# Patient Record
Sex: Female | Born: 1950
Health system: Southern US, Community
[De-identification: ages and names within clinical notes are randomized; demographics above are authoritative.]

## PROBLEM LIST (undated history)

## (undated) DIAGNOSIS — M1990 Unspecified osteoarthritis, unspecified site: Secondary | ICD-10-CM

## (undated) DIAGNOSIS — E785 Hyperlipidemia, unspecified: Secondary | ICD-10-CM

## (undated) DIAGNOSIS — E559 Vitamin D deficiency, unspecified: Secondary | ICD-10-CM

## (undated) DIAGNOSIS — I10 Essential (primary) hypertension: Secondary | ICD-10-CM

## (undated) DIAGNOSIS — D649 Anemia, unspecified: Secondary | ICD-10-CM

## (undated) HISTORY — PX: TONSILLECTOMY: SHX5217

## (undated) HISTORY — PX: ABDOMINAL SURGERY: SHX537

## (undated) HISTORY — PX: COLON SURGERY: SHX602

---
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
## 2018-10-01 ENCOUNTER — Other Ambulatory Visit: Payer: Self-pay

## 2018-10-01 ENCOUNTER — Ambulatory Visit (INDEPENDENT_AMBULATORY_CARE_PROVIDER_SITE_OTHER): Payer: Medicare Other | Admitting: Internal Medicine

## 2018-10-01 ENCOUNTER — Encounter (INDEPENDENT_AMBULATORY_CARE_PROVIDER_SITE_OTHER): Payer: Self-pay | Admitting: Internal Medicine

## 2018-10-01 VITALS — BP 140/70 | HR 72 | Ht 66.0 in | Wt 130.6 lb

## 2018-10-01 DIAGNOSIS — E559 Vitamin D deficiency, unspecified: Secondary | ICD-10-CM

## 2018-10-01 DIAGNOSIS — M19041 Primary osteoarthritis, right hand: Secondary | ICD-10-CM

## 2018-10-01 DIAGNOSIS — Z13820 Encounter for screening for osteoporosis: Secondary | ICD-10-CM

## 2018-10-01 DIAGNOSIS — E782 Mixed hyperlipidemia: Secondary | ICD-10-CM | POA: Diagnosis not present

## 2018-10-01 DIAGNOSIS — I10 Essential (primary) hypertension: Secondary | ICD-10-CM | POA: Diagnosis not present

## 2018-10-01 DIAGNOSIS — M1990 Unspecified osteoarthritis, unspecified site: Secondary | ICD-10-CM

## 2018-10-01 DIAGNOSIS — E785 Hyperlipidemia, unspecified: Secondary | ICD-10-CM | POA: Insufficient documentation

## 2018-10-01 DIAGNOSIS — E2839 Other primary ovarian failure: Secondary | ICD-10-CM

## 2018-10-01 DIAGNOSIS — M19042 Primary osteoarthritis, left hand: Secondary | ICD-10-CM

## 2018-10-01 HISTORY — DX: Unspecified osteoarthritis, unspecified site: M19.90

## 2018-10-01 HISTORY — DX: Hyperlipidemia, unspecified: E78.5

## 2018-10-01 HISTORY — DX: Vitamin D deficiency, unspecified: E55.9

## 2018-10-01 HISTORY — DX: Essential (primary) hypertension: I10

## 2018-10-01 MED ORDER — ESTRADIOL 0.5 MG PO TABS: 0.5000 mg | ORAL_TABLET | Freq: Every day | ORAL | 3 refills | Status: DC

## 2018-10-01 MED ORDER — PROGESTERONE MICRONIZED 100 MG PO CAPS: 100.0000 mg | ORAL_CAPSULE | Freq: Every day | ORAL | 3 refills | Status: DC

## 2018-10-01 NOTE — Progress Notes (Signed)
Wellness Office Visit  Subjective:  Patient ID: Anne Aguirre, female    DOB: 06-28-1950  Age: 68 y.o. MRN: [id number]  CC: This lady comes in for follow-up of hypertension, hyperlipidemia, vitamin D deficiency and  and osteoarthritis. HPI From the last visit, I discontinued her statin therapy since she does not have any history of coronary artery disease or cerebrovascular disease and recommended that she take vitamin D3 10,000 units a day, which she is doing. She continues on all her antihypertensive medications. She denies any chest pain, dyspnea or palpitations.  Past Medical History:  Diagnosis Date  . Essential hypertension, benign 10/01/2018  . HLD (hyperlipidemia) 10/01/2018  . Osteoarthritis 10/01/2018  . Vitamin D deficiency disease 10/01/2018      Family History  Problem Relation Age of Onset  . Hypertension Mother   . Stroke Mother   . Stroke Father   . Hypertension Brother   . 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     Current Meds  Medication Sig  . amLODipine (NORVASC) 10 MG tablet Take 10 mg by mouth daily.  . Cholecalciferol (VITAMIN D-3) 125 MCG (5000 UT) TABS Take 10,000 Units by mouth daily.  . irbesartan (AVAPRO) 300 MG tablet Take 300 mg by mouth daily.  . Nebivolol HCl (BYSTOLIC) 20 MG TABS Take 1 tablet by mouth daily.     Nutrition  Consistent and healthy. Sleep  She consistently wakes up at 3 in the morning and then takes a couple of hours before she can go to sleep again.  Exercise  Walks 2 miles , four days a week.Yoga every morning. Bio Identical Hormones  None.  Objective:   Today's Vitals: BP 140/70   Pulse 72   Ht 5\' 6"  (1.676 m)   Wt 130 lb 9.6 oz (59.2 kg)   BMI 21.08 kg/m  Vitals with BMI 10/01/2018  Height 5\' 6"   Weight 130 lbs 10 oz  BMI 97.02  Systolic 637  Diastolic 70  Pulse 72     Physical Exam   She looks systemically  well.  Her systolic blood pressure slightly elevated.  She is alert and orientated without any focal neurological signs.    Assessment   1. Osteoporosis screening   2. Essential hypertension, benign   3. Mixed hyperlipidemia   4. Vitamin D deficiency disease   5. Primary osteoarthritis of both hands   6. Menopause ovarian failure      Plan: 1. She will continue with all medications for chronic conditions above. 2. Blood work is ordered as below. 3. I will send her for a bone density scan to look for osteoporosis as she has not had one for more than a decade. 4. Today, we discussed bioidentical hormone therapy and discussed the women's health initiative study, as well as other studies showing benefits when bioidentical hormones are used.  I discussed the pros and cons, side effects and benefits regarding estradiol and progesterone.  She would like to proceed and I have sent a prescription for both at lower doses. 5. Further recommendations will depend on all these tests and I will see her in a couple of months time for follow-up to see how she is doing. 6. I spent 40 minutes with this patient face-to-face, more than 50% of the time was involved in discussing bioidentical hormone therapy.  Tests ordered Orders Placed This Encounter  Procedures  . DG Bone  Density  . COMPLETE METABOLIC PANEL WITH GFR  . VITAMIN D 25 Hydroxy (Vit-D Deficiency, Fractures)  . Lipid panel     Wilson SingerNimish C Cleofas Hudgins, MD

## 2018-10-02 ENCOUNTER — Encounter (INDEPENDENT_AMBULATORY_CARE_PROVIDER_SITE_OTHER): Payer: Self-pay | Admitting: Internal Medicine

## 2018-10-02 LAB — LIPID PANEL
Cholesterol: 236 mg/dL — ABNORMAL HIGH (ref <200)
HDL: 73 mg/dL (ref >=50)
LDL Cholesterol (Calc): 132 mg/dL (calc) — ABNORMAL HIGH
Non-HDL Cholesterol (Calc): 163 mg/dL (calc) — ABNORMAL HIGH (ref <130)
Total CHOL/HDL Ratio: 3.2 (calc) (ref <5.0)
Triglycerides: 173 mg/dL — ABNORMAL HIGH (ref <150)

## 2018-10-02 LAB — COMPLETE METABOLIC PANEL WITH GFR
AG Ratio: 1.6 (calc) (ref 1.0–2.5)
ALT: 15 U/L (ref 6–29)
AST: 30 U/L (ref 10–35)
Albumin: 4.4 g/dL (ref 3.6–5.1)
Alkaline phosphatase (APISO): 69 U/L (ref 37–153)
BUN: 14 mg/dL (ref 7–25)
CO2: 23 mmol/L (ref 20–32)
Calcium: 10.8 mg/dL — ABNORMAL HIGH (ref 8.6–10.4)
Chloride: 94 mmol/L — ABNORMAL LOW (ref 98–110)
Creat: 0.94 mg/dL (ref 0.50–0.99)
GFR, Est African American: 73 mL/min/{1.73_m2} (ref >=60)
GFR, Est Non African American: 63 mL/min/{1.73_m2} (ref >=60)
Globulin: 2.8 g/dL (calc) (ref 1.9–3.7)
Glucose, Bld: 94 mg/dL (ref 65–99)
Potassium: 4.7 mmol/L (ref 3.5–5.3)
Sodium: 130 mmol/L — ABNORMAL LOW (ref 135–146)
Total Bilirubin: 0.8 mg/dL (ref 0.2–1.2)
Total Protein: 7.2 g/dL (ref 6.1–8.1)

## 2018-10-02 LAB — VITAMIN D 25 HYDROXY (VIT D DEFICIENCY, FRACTURES): Vit D, 25-Hydroxy: 80 ng/mL (ref 30–100)

## 2018-10-06 ENCOUNTER — Other Ambulatory Visit: Payer: Self-pay | Admitting: *Deleted

## 2018-10-06 DIAGNOSIS — Z20822 Contact with and (suspected) exposure to covid-19: Secondary | ICD-10-CM

## 2018-10-08 LAB — NOVEL CORONAVIRUS, NAA: SARS-CoV-2, NAA: NOT DETECTED

## 2018-10-24 ENCOUNTER — Other Ambulatory Visit (INDEPENDENT_AMBULATORY_CARE_PROVIDER_SITE_OTHER): Payer: Self-pay | Admitting: Internal Medicine

## 2018-11-02 ENCOUNTER — Other Ambulatory Visit: Payer: Self-pay

## 2018-11-02 ENCOUNTER — Ambulatory Visit (INDEPENDENT_AMBULATORY_CARE_PROVIDER_SITE_OTHER): Payer: Medicare Other

## 2018-11-02 DIAGNOSIS — Z23 Encounter for immunization: Secondary | ICD-10-CM

## 2018-11-17 ENCOUNTER — Other Ambulatory Visit (INDEPENDENT_AMBULATORY_CARE_PROVIDER_SITE_OTHER): Payer: Self-pay | Admitting: Internal Medicine

## 2018-11-18 ENCOUNTER — Other Ambulatory Visit (INDEPENDENT_AMBULATORY_CARE_PROVIDER_SITE_OTHER): Payer: Self-pay | Admitting: Internal Medicine

## 2018-11-19 ENCOUNTER — Telehealth (INDEPENDENT_AMBULATORY_CARE_PROVIDER_SITE_OTHER): Payer: Self-pay

## 2018-11-19 ENCOUNTER — Other Ambulatory Visit (INDEPENDENT_AMBULATORY_CARE_PROVIDER_SITE_OTHER): Payer: Self-pay | Admitting: Internal Medicine

## 2018-11-19 MED ORDER — ESTRADIOL 0.5 MG PO TABS: 0.5000 mg | ORAL_TABLET | Freq: Every day | ORAL | 0 refills | Status: DC

## 2018-11-19 MED ORDER — IRBESARTAN 300 MG PO TABS: 300.0000 mg | ORAL_TABLET | Freq: Every day | ORAL | 0 refills | Status: DC

## 2018-11-19 MED ORDER — AMLODIPINE BESYLATE 10 MG PO TABS: 10.0000 mg | ORAL_TABLET | Freq: Every day | ORAL | 0 refills | Status: DC

## 2018-11-19 MED ORDER — BYSTOLIC 20 MG PO TABS: 1.0000 | ORAL_TABLET | Freq: Every day | ORAL | 0 refills | Status: DC

## 2018-11-19 MED ORDER — PROGESTERONE MICRONIZED 100 MG PO CAPS: 100.0000 mg | ORAL_CAPSULE | Freq: Every day | ORAL | 0 refills | Status: DC

## 2018-11-19 NOTE — Telephone Encounter (Signed)
Please let the patient know that I have sent a 90-day supply of all her medications to [facility].

## 2018-11-25 ENCOUNTER — Ambulatory Visit (INDEPENDENT_AMBULATORY_CARE_PROVIDER_SITE_OTHER): Payer: Medicare Other | Admitting: Internal Medicine

## 2019-02-10 ENCOUNTER — Telehealth (INDEPENDENT_AMBULATORY_CARE_PROVIDER_SITE_OTHER): Payer: Self-pay

## 2019-02-10 NOTE — Telephone Encounter (Signed)
Pt is calling she has Blood pressure issues and wants to know if she is safe to get the vaccination

## 2019-02-10 NOTE — Telephone Encounter (Signed)
If she is referring to COVID-19 vaccination, yes, she is safe to get this vaccine.

## 2019-02-10 NOTE — Telephone Encounter (Signed)
Called left message of instructions. Thank you

## 2019-02-26 ENCOUNTER — Other Ambulatory Visit (INDEPENDENT_AMBULATORY_CARE_PROVIDER_SITE_OTHER): Payer: Self-pay | Admitting: Internal Medicine

## 2019-05-27 ENCOUNTER — Other Ambulatory Visit (INDEPENDENT_AMBULATORY_CARE_PROVIDER_SITE_OTHER): Payer: Self-pay | Admitting: Internal Medicine

## 2019-05-27 ENCOUNTER — Telehealth (INDEPENDENT_AMBULATORY_CARE_PROVIDER_SITE_OTHER): Payer: Self-pay | Admitting: Nurse Practitioner

## 2019-05-27 NOTE — Telephone Encounter (Signed)
I called her and left her a voicemail to call the office and schedule a office visit before her next refill

## 2019-05-27 NOTE — Telephone Encounter (Signed)
Benedetto Goad, when you have time either this week or next week will you call this patient and see if can get her scheduled for follow-up.  I refilled a 30-day supply of her blood pressure medication, but I would not be able to refill any additional prescriptions until she is been seen in the office by either myself or Dr. Karilyn Cota.  Thank you.

## 2019-06-16 ENCOUNTER — Encounter (INDEPENDENT_AMBULATORY_CARE_PROVIDER_SITE_OTHER): Payer: Self-pay | Admitting: Internal Medicine

## 2019-06-16 ENCOUNTER — Other Ambulatory Visit: Payer: Self-pay

## 2019-06-16 ENCOUNTER — Ambulatory Visit (INDEPENDENT_AMBULATORY_CARE_PROVIDER_SITE_OTHER): Payer: Medicare PPO | Admitting: Internal Medicine

## 2019-06-16 VITALS — BP 140/80 | HR 80 | Temp 98.5°F | Ht 66.0 in | Wt 132.4 lb

## 2019-06-16 DIAGNOSIS — F064 Anxiety disorder due to known physiological condition: Secondary | ICD-10-CM

## 2019-06-16 DIAGNOSIS — Z13820 Encounter for screening for osteoporosis: Secondary | ICD-10-CM | POA: Diagnosis not present

## 2019-06-16 DIAGNOSIS — E559 Vitamin D deficiency, unspecified: Secondary | ICD-10-CM | POA: Diagnosis not present

## 2019-06-16 DIAGNOSIS — I10 Essential (primary) hypertension: Secondary | ICD-10-CM

## 2019-06-16 DIAGNOSIS — E782 Mixed hyperlipidemia: Secondary | ICD-10-CM | POA: Diagnosis not present

## 2019-06-16 LAB — COMPLETE METABOLIC PANEL WITH GFR
AG Ratio: 1.6 (calc) (ref 1.0–2.5)
ALT: 15 U/L (ref 6–29)
AST: 27 U/L (ref 10–35)
Albumin: 4.3 g/dL (ref 3.6–5.1)
Alkaline phosphatase (APISO): 68 U/L (ref 37–153)
BUN: 13 mg/dL (ref 7–25)
CO2: 22 mmol/L (ref 20–32)
Calcium: 10 mg/dL (ref 8.6–10.4)
Chloride: 99 mmol/L (ref 98–110)
Creat: 0.94 mg/dL (ref 0.50–0.99)
GFR, Est African American: 73 mL/min/{1.73_m2} (ref >=60)
GFR, Est Non African American: 63 mL/min/{1.73_m2} (ref >=60)
Globulin: 2.7 g/dL (calc) (ref 1.9–3.7)
Glucose, Bld: 110 mg/dL — ABNORMAL HIGH (ref 65–99)
Potassium: 4.5 mmol/L (ref 3.5–5.3)
Sodium: 133 mmol/L — ABNORMAL LOW (ref 135–146)
Total Bilirubin: 0.7 mg/dL (ref 0.2–1.2)
Total Protein: 7 g/dL (ref 6.1–8.1)

## 2019-06-16 LAB — LIPID PANEL
Cholesterol: 221 mg/dL — ABNORMAL HIGH (ref <200)
HDL: 77 mg/dL (ref >=50)
LDL Cholesterol (Calc): 120 mg/dL (calc) — ABNORMAL HIGH
Non-HDL Cholesterol (Calc): 144 mg/dL (calc) — ABNORMAL HIGH (ref <130)
Total CHOL/HDL Ratio: 2.9 (calc) (ref <5.0)
Triglycerides: 127 mg/dL (ref <150)

## 2019-06-16 MED ORDER — LORAZEPAM 0.5 MG PO TABS: 0.5000 mg | ORAL_TABLET | Freq: Two times a day (BID) | ORAL | 0 refills | Status: DC | PRN

## 2019-06-16 MED ORDER — BYSTOLIC 20 MG PO TABS: 1.0000 | ORAL_TABLET | Freq: Every day | ORAL | 0 refills | Status: DC
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
## 2019-06-22 NOTE — Progress Notes (Signed)
Patient called. She was on vacation; she does not have PC access. Pt was  given lab results.

## 2019-07-01 ENCOUNTER — Other Ambulatory Visit (INDEPENDENT_AMBULATORY_CARE_PROVIDER_SITE_OTHER): Payer: Self-pay | Admitting: Nurse Practitioner

## 2019-07-01 DIAGNOSIS — I10 Essential (primary) hypertension: Secondary | ICD-10-CM

## 2019-07-01 MED ORDER — AMLODIPINE BESYLATE 10 MG PO TABS: 10.0000 mg | ORAL_TABLET | Freq: Every day | ORAL | 0 refills | Status: DC

## 2019-08-30 ENCOUNTER — Other Ambulatory Visit (INDEPENDENT_AMBULATORY_CARE_PROVIDER_SITE_OTHER): Payer: Self-pay | Admitting: Internal Medicine

## 2019-09-15 ENCOUNTER — Other Ambulatory Visit (INDEPENDENT_AMBULATORY_CARE_PROVIDER_SITE_OTHER): Payer: Self-pay | Admitting: Internal Medicine

## 2019-09-21 ENCOUNTER — Encounter (INDEPENDENT_AMBULATORY_CARE_PROVIDER_SITE_OTHER): Payer: Self-pay | Admitting: Internal Medicine

## 2019-09-21 ENCOUNTER — Ambulatory Visit (INDEPENDENT_AMBULATORY_CARE_PROVIDER_SITE_OTHER): Payer: Medicare PPO | Admitting: Internal Medicine

## 2019-09-21 ENCOUNTER — Other Ambulatory Visit: Payer: Self-pay

## 2019-09-21 VITALS — BP 145/85 | HR 62 | Temp 97.9°F | Ht 66.0 in | Wt 134.0 lb

## 2019-09-21 DIAGNOSIS — E782 Mixed hyperlipidemia: Secondary | ICD-10-CM | POA: Diagnosis not present

## 2019-09-21 DIAGNOSIS — I10 Essential (primary) hypertension: Secondary | ICD-10-CM

## 2019-09-21 DIAGNOSIS — Z13820 Encounter for screening for osteoporosis: Secondary | ICD-10-CM | POA: Diagnosis not present

## 2019-09-21 MED ORDER — AMLODIPINE BESYLATE 10 MG PO TABS: 10.0000 mg | ORAL_TABLET | Freq: Every day | ORAL | 1 refills | Status: DC

## 2019-09-21 NOTE — Progress Notes (Signed)
Metrics: Intervention Frequency ACO  Documented Smoking Status Yearly  Screened one or more times in 24 months  Cessation Counseling or  Active cessation medication Past 24 months  Past 24 months   Guideline developer: UpToDate (See UpToDate for funding source) Date Released: 2014       Wellness Office Visit  Subjective:  Patient ID: Anne Aguirre, female    DOB: 07-Sep-1950  Age: 69 y.o. MRN: [id number]  CC: This lady comes in for follow-up of hypertension, hyperlipidemia. HPI  I did order a bone density scan but for some reason it was not scheduled.  We will chase this up now. She continues on amlodipine, Bystolic and Avapro for her hypertension.  She denies any chest pain, dyspnea, palpitations or limb weakness. Her hyperlipidemia has not required statin therapy. Past Medical History:  Diagnosis Date  . Essential hypertension, benign 10/01/2018  . HLD (hyperlipidemia) 10/01/2018  . Osteoarthritis 10/01/2018  . Vitamin D deficiency disease 10/01/2018   Past Surgical History:  Procedure Laterality Date  . TONSILLECTOMY Bilateral      Family History  Problem Relation Age of Onset  . Hypertension Mother   . Stroke Mother   . Stroke Father   . Hypertension Brother   . 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   Social History   Tobacco Use  . Smoking status: Never Smoker  . Smokeless tobacco: Never Used  Substance Use Topics  . Alcohol use: Yes    Alcohol/week: 7.0 standard drinks    Types: 7 Glasses of wine per week    Comment: Red wine    Current Meds  Medication Sig  . amLODipine (NORVASC) 10 MG tablet Take 1 tablet (10 mg total) by mouth daily.  Marland Kitchen BYSTOLIC 20 MG TABS TAKE ONE TABLET (20MG  TOTAL) BY MOUTH DAILY  . Cholecalciferol (VITAMIN D-3) 125 MCG (5000 UT) TABS Take 10,000 Units by mouth daily.  . irbesartan (AVAPRO) 300 MG tablet TAKE ONE TABLET (300MG  TOTAL) BY MOUTH DAILY    . [DISCONTINUED] amLODipine (NORVASC) 10 MG tablet Take 1 tablet (10 mg total) by mouth daily.      No flowsheet data found.   Objective:   Today's Vitals: BP (!) 145/85 (BP Location: Left Arm, Patient Position: Sitting, Cuff Size: Normal)   Pulse 62   Temp 97.9 F (36.6 C) (Temporal)   Ht 5\' 6"  (1.676 m)   Wt 134 lb (60.8 kg)   SpO2 94%   BMI 21.63 kg/m  Vitals with BMI 09/21/2019 06/16/2019 10/01/2018  Height 5\' 6"  5\' 6"  5\' 6"   Weight 134 lbs 132 lbs 6 oz 130 lbs 10 oz  BMI 21.64 21.38 21.09  Systolic 145 140 11/21/2019  Diastolic 85 80 70  Pulse 62 80 72     Physical Exam  She looks systemically well.  Weight is stable.  Blood pressure is slightly elevated today but not unreasonable for her age.     Assessment   1. Essential hypertension, benign   2. Osteoporosis screening   3. Mixed hyperlipidemia       Tests ordered No orders of the defined types were placed in this encounter.    Plan: 1. She will continue with all antihypertensive medications listed above.  I have refilled her amlodipine today. 2. She will continue to focus on nutrition as far as her hyperlipidemia is concerned. 3. We will chase up on the bone density scan.  4. I will see in the next 3 to 4 months for an annual physical exam when we will do all the blood work.   Meds ordered this encounter  Medications  . amLODipine (NORVASC) 10 MG tablet    Sig: Take 1 tablet (10 mg total) by mouth daily.    Dispense:  90 tablet    Refill:  1    Amrie Gurganus Normajean Glasgow, MD

## 2019-09-28 ENCOUNTER — Other Ambulatory Visit (HOSPITAL_COMMUNITY): Payer: Medicare PPO

## 2019-11-10 ENCOUNTER — Ambulatory Visit (INDEPENDENT_AMBULATORY_CARE_PROVIDER_SITE_OTHER): Payer: Medicare PPO

## 2019-11-29 ENCOUNTER — Other Ambulatory Visit (INDEPENDENT_AMBULATORY_CARE_PROVIDER_SITE_OTHER): Payer: Self-pay | Admitting: Nurse Practitioner

## 2019-12-14 ENCOUNTER — Other Ambulatory Visit (INDEPENDENT_AMBULATORY_CARE_PROVIDER_SITE_OTHER): Payer: Self-pay | Admitting: Nurse Practitioner

## 2020-01-24 ENCOUNTER — Encounter (INDEPENDENT_AMBULATORY_CARE_PROVIDER_SITE_OTHER): Payer: Medicare PPO | Admitting: Internal Medicine

## 2020-02-28 ENCOUNTER — Telehealth (INDEPENDENT_AMBULATORY_CARE_PROVIDER_SITE_OTHER): Payer: Self-pay | Admitting: Internal Medicine

## 2020-02-28 ENCOUNTER — Other Ambulatory Visit (INDEPENDENT_AMBULATORY_CARE_PROVIDER_SITE_OTHER): Payer: Self-pay | Admitting: Internal Medicine

## 2020-02-28 MED ORDER — IRBESARTAN 300 MG PO TABS
300.0000 mg | ORAL_TABLET | Freq: Every day | ORAL | 0 refills | Status: DC
Start: 2020-02-28 — End: 2020-03-30

## 2020-02-28 NOTE — Telephone Encounter (Signed)
Done

## 2020-03-15 ENCOUNTER — Other Ambulatory Visit (INDEPENDENT_AMBULATORY_CARE_PROVIDER_SITE_OTHER): Payer: Self-pay | Admitting: Internal Medicine

## 2020-03-20 ENCOUNTER — Other Ambulatory Visit: Payer: Self-pay

## 2020-03-20 ENCOUNTER — Encounter (INDEPENDENT_AMBULATORY_CARE_PROVIDER_SITE_OTHER): Payer: Self-pay | Admitting: Internal Medicine

## 2020-03-20 ENCOUNTER — Ambulatory Visit (INDEPENDENT_AMBULATORY_CARE_PROVIDER_SITE_OTHER): Payer: Medicare PPO | Admitting: Internal Medicine

## 2020-03-20 VITALS — BP 122/80 | HR 88 | Temp 97.9°F | Ht 66.0 in | Wt 132.2 lb

## 2020-03-20 DIAGNOSIS — Z23 Encounter for immunization: Secondary | ICD-10-CM

## 2020-03-20 DIAGNOSIS — Z1159 Encounter for screening for other viral diseases: Secondary | ICD-10-CM | POA: Diagnosis not present

## 2020-03-20 DIAGNOSIS — E559 Vitamin D deficiency, unspecified: Secondary | ICD-10-CM | POA: Diagnosis not present

## 2020-03-20 DIAGNOSIS — Z0001 Encounter for general adult medical examination with abnormal findings: Secondary | ICD-10-CM

## 2020-03-20 DIAGNOSIS — E782 Mixed hyperlipidemia: Secondary | ICD-10-CM | POA: Diagnosis not present

## 2020-03-20 DIAGNOSIS — I10 Essential (primary) hypertension: Secondary | ICD-10-CM

## 2020-03-20 NOTE — Progress Notes (Signed)
Chief Complaint: This pleasant 70 year old lady comes in for an annual physical exam and to address her chronic conditions listed below. HPI: She has a history of hypertension for which she is takes several medications listed below.  She has no history of coronary artery disease or cerebrovascular disease. She has a history of hyperlipidemia but has not required statin therapy. She also has vitamin D deficiency and takes vitamin D3 supplementation. She denies any chest pain, dyspnea, palpitations or limb weakness.  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    Social History   Tobacco Use   Smoking status: Never Smoker   Smokeless tobacco: Never Used  Substance Use Topics   Alcohol use: Yes    Alcohol/week: 7.0 standard drinks    Types: 7 Glasses of wine per week    Comment: Red wine      Allergies: No Known Allergies   Current Meds  Medication Sig   amLODipine (NORVASC) 10 MG tablet Take 1 tablet (10 mg total) by mouth daily.   Cholecalciferol (VITAMIN D-3) 125 MCG (5000 UT) TABS Take 10,000 Units by mouth daily.   irbesartan (AVAPRO) 300 MG tablet Take 1 tablet (300 mg total) by mouth daily.   Nebivolol HCl 20 MG TABS TAKE ONE TABLET (20MG  TOTAL) BY MOUTH DAILY     Flowsheet Row Office Visit from 03/20/2020 in [facility]  PHQ-9 Total Score 0      [place] from the symptoms mentioned above,there are no other symptoms referable to all systems reviewed.       Physical Exam: Blood pressure 122/80, pulse 88, temperature 97.9 F (36.6 C), temperature source Temporal, height 5\' 6"  (1.676 m), weight 132 lb 3.2 oz  (60 kg), SpO2 95 %. Vitals with BMI 03/20/2020 09/21/2019 06/16/2019  Height 5\' 6"  5\' 6"  5\' 6"   Weight 132 lbs 3 oz 134 lbs 132 lbs 6 oz  BMI 21.35 21.64 21.38  Systolic 122 145 11/21/2019  Diastolic 80 85 80  Pulse 88 62 80      She looks systemically well.  Healthy weight. General: Alert, cooperative, and appears to be the stated age.No pallor.  No jaundice.  No clubbing. Head: Normocephalic Eyes: Sclera white, pupils equal and reactive to light, red reflex x 2,  Ears: Normal bilaterally Oral cavity: Lips, mucosa, and tongue normal: Teeth and gums normal Neck: No adenopathy, supple, symmetrical, trachea midline, and thyroid does not appear enlarged. Breast: No masses felt. Respiratory: Clear to auscultation bilaterally.No wheezing, crackles or bronchial breathing. Cardiovascular: Heart sounds are present and appear to be normal without murmurs or added sounds.  No carotid bruits.  Peripheral pulses are present and equal bilaterally.: Gastrointestinal:positive bowel sounds, no hepatosplenomegaly.  No masses felt.No tenderness. Skin: Clear, No rashes noted.No worrisome skin lesions seen. Neurological: Grossly intact without focal findings, cranial nerves II through XII intact, muscle strength equal bilaterally Musculoskeletal: No acute joint abnormalities noted.Full range of movement noted with joints. Psychiatric: Affect appropriate, non-anxious.    Assessment  1. Encounter for general adult medical examination with abnormal findings   2. Encounter for hepatitis C screening test for low risk patient   3. Essential hypertension, benign  4. Mixed hyperlipidemia   5. Vitamin D deficiency disease     Tests Ordered:   Orders Placed This Encounter  Procedures   Hepatitis C antibody   CBC   COMPLETE METABOLIC PANEL WITH GFR   Lipid panel   T3, free   T4, free   TSH   VITAMIN D 25 Hydroxy (Vit-D Deficiency, Fractures)     Plan  1. Relatively healthy 70 year old lady with  hypertension which is controlled. 2. Blood work is ordered. 3. She will continue with all antihypertensive medications listed above. 4. Further recommendations will depend on blood results and she will follow up with Sarah in 4 months.  Today, she was given Prevnar 13 vaccination. 5. In addition to a preventative visit, I performed an office visit to address her hypertension today.     No orders of the defined types were placed in this encounter.    Michaeljoseph Revolorio C Drucella Karbowski   03/20/2020, 3:05 PM

## 2020-03-20 NOTE — Addendum Note (Signed)
Addended by: Ronita Hipps on: 03/20/2020 03:22 PM   Modules accepted: Orders

## 2020-03-21 LAB — COMPLETE METABOLIC PANEL WITH GFR
AG Ratio: 1.5 (calc) (ref 1.0–2.5)
ALT: 16 U/L (ref 6–29)
AST: 27 U/L (ref 10–35)
Albumin: 4.1 g/dL (ref 3.6–5.1)
Alkaline phosphatase (APISO): 73 U/L (ref 37–153)
BUN/Creatinine Ratio: 14 (calc) (ref 6–22)
BUN: 15 mg/dL (ref 7–25)
CO2: 25 mmol/L (ref 20–32)
Calcium: 11.4 mg/dL — ABNORMAL HIGH (ref 8.6–10.4)
Chloride: 97 mmol/L — ABNORMAL LOW (ref 98–110)
Creat: 1.04 mg/dL — ABNORMAL HIGH (ref 0.50–0.99)
GFR, Est African American: 64 mL/min/{1.73_m2} (ref >=60)
GFR, Est Non African American: 55 mL/min/{1.73_m2} — ABNORMAL LOW (ref >=60)
Globulin: 2.8 g/dL (calc) (ref 1.9–3.7)
Glucose, Bld: 169 mg/dL — ABNORMAL HIGH (ref 65–139)
Potassium: 3.9 mmol/L (ref 3.5–5.3)
Sodium: 133 mmol/L — ABNORMAL LOW (ref 135–146)
Total Bilirubin: 0.7 mg/dL (ref 0.2–1.2)
Total Protein: 6.9 g/dL (ref 6.1–8.1)

## 2020-03-21 LAB — LIPID PANEL
Cholesterol: 213 mg/dL — ABNORMAL HIGH (ref <200)
HDL: 61 mg/dL (ref >=50)
LDL Cholesterol (Calc): 103 mg/dL (calc) — ABNORMAL HIGH
Non-HDL Cholesterol (Calc): 152 mg/dL (calc) — ABNORMAL HIGH (ref <130)
Total CHOL/HDL Ratio: 3.5 (calc) (ref <5.0)
Triglycerides: 365 mg/dL — ABNORMAL HIGH (ref <150)

## 2020-03-21 LAB — CBC
HCT: 46.8 % — ABNORMAL HIGH (ref 35.0–45.0)
Hemoglobin: 16.6 g/dL — ABNORMAL HIGH (ref 11.7–15.5)
MCH: 34.2 pg — ABNORMAL HIGH (ref 27.0–33.0)
MCHC: 35.5 g/dL (ref 32.0–36.0)
MCV: 96.5 fL (ref 80.0–100.0)
MPV: 9.4 fL (ref 7.5–12.5)
Platelets: 312 10*3/uL (ref 140–400)
RBC: 4.85 10*6/uL (ref 3.80–5.10)
RDW: 11.3 % (ref 11.0–15.0)
WBC: 9.7 10*3/uL (ref 3.8–10.8)

## 2020-03-21 LAB — VITAMIN D 25 HYDROXY (VIT D DEFICIENCY, FRACTURES): Vit D, 25-Hydroxy: 63 ng/mL (ref 30–100)

## 2020-03-21 LAB — HEPATITIS C ANTIBODY
Hepatitis C Ab: NONREACTIVE
SIGNAL TO CUT-OFF: 0.01 (ref <1.00)

## 2020-03-21 LAB — T4, FREE: Free T4: 1.1 ng/dL (ref 0.8–1.8)

## 2020-03-21 LAB — TSH: TSH: 2.72 mIU/L (ref 0.40–4.50)

## 2020-03-21 LAB — T3, FREE: T3, Free: 3.4 pg/mL (ref 2.3–4.2)

## 2020-03-28 NOTE — Progress Notes (Signed)
Please call this patient to make sure she read her MyChart message regarding her elevated blood glucose as she needs to come in for fasting glucose 1 day.  Thanks.

## 2020-03-29 ENCOUNTER — Other Ambulatory Visit (INDEPENDENT_AMBULATORY_CARE_PROVIDER_SITE_OTHER): Payer: Self-pay | Admitting: Internal Medicine

## 2020-03-29 DIAGNOSIS — R739 Hyperglycemia, unspecified: Secondary | ICD-10-CM

## 2020-03-30 ENCOUNTER — Telehealth (INDEPENDENT_AMBULATORY_CARE_PROVIDER_SITE_OTHER): Payer: Self-pay

## 2020-03-30 ENCOUNTER — Other Ambulatory Visit (INDEPENDENT_AMBULATORY_CARE_PROVIDER_SITE_OTHER): Payer: Self-pay | Admitting: Internal Medicine

## 2020-03-30 ENCOUNTER — Other Ambulatory Visit (INDEPENDENT_AMBULATORY_CARE_PROVIDER_SITE_OTHER): Payer: Medicare PPO

## 2020-03-30 DIAGNOSIS — I10 Essential (primary) hypertension: Secondary | ICD-10-CM

## 2020-03-30 MED ORDER — AMLODIPINE BESYLATE 10 MG PO TABS: 10.0000 mg | ORAL_TABLET | Freq: Every day | ORAL | 1 refills | Status: DC

## 2020-03-30 MED ORDER — IRBESARTAN 300 MG PO TABS: 300.0000 mg | ORAL_TABLET | Freq: Every day | ORAL | 1 refills | Status: DC

## 2020-03-30 NOTE — Telephone Encounter (Signed)
Patient called and asked for a refill of the following medications to be sent to [facility] for 90 days only.  amLODipine (NORVASC) 10 MG tablet  Last filled 09/21/2019, # 90 with 1 refill  irbesartan (AVAPRO) 300 MG tablet  Last filled 02/28/2020, # 90 with 0 refills, they only gave her 15 due to limited amount on hand and she needs a new Rx sent  She will be switching to [facility] in [place] after this.

## 2020-04-04 ENCOUNTER — Other Ambulatory Visit: Payer: Self-pay

## 2020-04-04 ENCOUNTER — Other Ambulatory Visit (INDEPENDENT_AMBULATORY_CARE_PROVIDER_SITE_OTHER): Payer: Medicare PPO

## 2020-04-04 DIAGNOSIS — R739 Hyperglycemia, unspecified: Secondary | ICD-10-CM | POA: Diagnosis not present

## 2020-04-05 LAB — COMPLETE METABOLIC PANEL WITH GFR
AG Ratio: 1.3 (calc) (ref 1.0–2.5)
ALT: 20 U/L (ref 6–29)
AST: 33 U/L (ref 10–35)
Albumin: 4.3 g/dL (ref 3.6–5.1)
Alkaline phosphatase (APISO): 68 U/L (ref 37–153)
BUN/Creatinine Ratio: 13 (calc) (ref 6–22)
BUN: 13 mg/dL (ref 7–25)
CO2: 24 mmol/L (ref 20–32)
Calcium: 10.6 mg/dL — ABNORMAL HIGH (ref 8.6–10.4)
Chloride: 97 mmol/L — ABNORMAL LOW (ref 98–110)
Creat: 1.01 mg/dL — ABNORMAL HIGH (ref 0.50–0.99)
GFR, Est African American: 66 mL/min/{1.73_m2} (ref >=60)
GFR, Est Non African American: 57 mL/min/{1.73_m2} — ABNORMAL LOW (ref >=60)
Globulin: 3.2 g/dL (calc) (ref 1.9–3.7)
Glucose, Bld: 89 mg/dL (ref 65–99)
Potassium: 4.1 mmol/L (ref 3.5–5.3)
Sodium: 134 mmol/L — ABNORMAL LOW (ref 135–146)
Total Bilirubin: 0.8 mg/dL (ref 0.2–1.2)
Total Protein: 7.5 g/dL (ref 6.1–8.1)

## 2020-04-05 LAB — HEMOGLOBIN A1C
Hgb A1c MFr Bld: 5 % of total Hgb (ref <5.7)
Mean Plasma Glucose: 97 mg/dL
eAG (mmol/L): 5.4 mmol/L

## 2020-05-05 ENCOUNTER — Telehealth (INDEPENDENT_AMBULATORY_CARE_PROVIDER_SITE_OTHER): Payer: Self-pay | Admitting: Internal Medicine

## 2020-05-05 NOTE — Telephone Encounter (Signed)
Left message for patient to call back and schedule Medicare Annual Wellness Visit (AWV) either virtually    Please see if patient can do a virtual appointment on 05/11/20   awvi 01/15/19 per [facility]   please schedule at anytime with health coach  This should be a 45 minute visit.  Gave my jabber number for pt to call back

## 2020-05-29 ENCOUNTER — Other Ambulatory Visit (INDEPENDENT_AMBULATORY_CARE_PROVIDER_SITE_OTHER): Payer: Self-pay

## 2020-05-30 MED ORDER — IRBESARTAN 300 MG PO TABS: 300.0000 mg | ORAL_TABLET | Freq: Every day | ORAL | 1 refills | Status: DC

## 2020-05-30 MED ORDER — NEBIVOLOL HCL 20 MG PO TABS: 20.0000 mg | ORAL_TABLET | Freq: Every day | ORAL | 0 refills | Status: DC

## 2020-07-20 ENCOUNTER — Ambulatory Visit (INDEPENDENT_AMBULATORY_CARE_PROVIDER_SITE_OTHER): Payer: Medicare PPO | Admitting: Nurse Practitioner

## 2020-08-23 ENCOUNTER — Inpatient Hospital Stay (HOSPITAL_COMMUNITY)
Admission: EM | Admit: 2020-08-23 | Discharge: 2020-09-08 | DRG: 853 | Disposition: A | Discharge status: Home Health | Payer: Medicare PPO | Attending: Internal Medicine | Admitting: Internal Medicine

## 2020-08-23 ENCOUNTER — Emergency Department (HOSPITAL_COMMUNITY): Payer: Medicare PPO

## 2020-08-23 ENCOUNTER — Other Ambulatory Visit: Payer: Self-pay

## 2020-08-23 ENCOUNTER — Encounter (HOSPITAL_COMMUNITY): Payer: Self-pay | Admitting: Emergency Medicine

## 2020-08-23 ENCOUNTER — Ambulatory Visit: Payer: Medicare PPO

## 2020-08-23 DIAGNOSIS — R609 Edema, unspecified: Secondary | ICD-10-CM | POA: Diagnosis not present

## 2020-08-23 DIAGNOSIS — R0902 Hypoxemia: Secondary | ICD-10-CM | POA: Diagnosis not present

## 2020-08-23 DIAGNOSIS — K209 Esophagitis, unspecified without bleeding: Secondary | ICD-10-CM

## 2020-08-23 DIAGNOSIS — J9601 Acute respiratory failure with hypoxia: Secondary | ICD-10-CM | POA: Diagnosis not present

## 2020-08-23 DIAGNOSIS — D72819 Decreased white blood cell count, unspecified: Secondary | ICD-10-CM | POA: Diagnosis not present

## 2020-08-23 DIAGNOSIS — L0291 Cutaneous abscess, unspecified: Secondary | ICD-10-CM

## 2020-08-23 DIAGNOSIS — F419 Anxiety disorder, unspecified: Secondary | ICD-10-CM | POA: Diagnosis present

## 2020-08-23 DIAGNOSIS — N1831 Chronic kidney disease, stage 3a: Secondary | ICD-10-CM | POA: Diagnosis not present

## 2020-08-23 DIAGNOSIS — D75839 Thrombocytosis, unspecified: Secondary | ICD-10-CM | POA: Diagnosis present

## 2020-08-23 DIAGNOSIS — K5939 Other megacolon: Secondary | ICD-10-CM | POA: Diagnosis not present

## 2020-08-23 DIAGNOSIS — R0602 Shortness of breath: Secondary | ICD-10-CM | POA: Diagnosis not present

## 2020-08-23 DIAGNOSIS — K658 Other peritonitis: Secondary | ICD-10-CM | POA: Diagnosis not present

## 2020-08-23 DIAGNOSIS — U071 COVID-19: Secondary | ICD-10-CM

## 2020-08-23 DIAGNOSIS — E860 Dehydration: Secondary | ICD-10-CM | POA: Diagnosis not present

## 2020-08-23 DIAGNOSIS — I129 Hypertensive chronic kidney disease with stage 1 through stage 4 chronic kidney disease, or unspecified chronic kidney disease: Secondary | ICD-10-CM | POA: Diagnosis present

## 2020-08-23 DIAGNOSIS — N179 Acute kidney failure, unspecified: Secondary | ICD-10-CM

## 2020-08-23 DIAGNOSIS — R0600 Dyspnea, unspecified: Secondary | ICD-10-CM

## 2020-08-23 DIAGNOSIS — I10 Essential (primary) hypertension: Secondary | ICD-10-CM

## 2020-08-23 DIAGNOSIS — J90 Pleural effusion, not elsewhere classified: Secondary | ICD-10-CM | POA: Diagnosis not present

## 2020-08-23 DIAGNOSIS — R110 Nausea: Secondary | ICD-10-CM | POA: Diagnosis not present

## 2020-08-23 DIAGNOSIS — A419 Sepsis, unspecified organism: Principal | ICD-10-CM | POA: Diagnosis present

## 2020-08-23 DIAGNOSIS — K6389 Other specified diseases of intestine: Secondary | ICD-10-CM | POA: Diagnosis not present

## 2020-08-23 DIAGNOSIS — K5792 Diverticulitis of intestine, part unspecified, without perforation or abscess without bleeding: Secondary | ICD-10-CM | POA: Diagnosis not present

## 2020-08-23 DIAGNOSIS — K7689 Other specified diseases of liver: Secondary | ICD-10-CM | POA: Diagnosis present

## 2020-08-23 DIAGNOSIS — R1110 Vomiting, unspecified: Secondary | ICD-10-CM | POA: Diagnosis not present

## 2020-08-23 DIAGNOSIS — J189 Pneumonia, unspecified organism: Secondary | ICD-10-CM | POA: Diagnosis not present

## 2020-08-23 DIAGNOSIS — K5720 Diverticulitis of large intestine with perforation and abscess without bleeding: Secondary | ICD-10-CM | POA: Diagnosis not present

## 2020-08-23 DIAGNOSIS — R6520 Severe sepsis without septic shock: Secondary | ICD-10-CM | POA: Diagnosis present

## 2020-08-23 DIAGNOSIS — K567 Ileus, unspecified: Secondary | ICD-10-CM | POA: Diagnosis not present

## 2020-08-23 DIAGNOSIS — D72829 Elevated white blood cell count, unspecified: Secondary | ICD-10-CM | POA: Diagnosis not present

## 2020-08-23 DIAGNOSIS — R188 Other ascites: Secondary | ICD-10-CM | POA: Diagnosis not present

## 2020-08-23 DIAGNOSIS — E871 Hypo-osmolality and hyponatremia: Secondary | ICD-10-CM | POA: Diagnosis not present

## 2020-08-23 DIAGNOSIS — K3189 Other diseases of stomach and duodenum: Secondary | ICD-10-CM | POA: Diagnosis not present

## 2020-08-23 DIAGNOSIS — E876 Hypokalemia: Secondary | ICD-10-CM | POA: Diagnosis not present

## 2020-08-23 DIAGNOSIS — J1282 Pneumonia due to coronavirus disease 2019: Secondary | ICD-10-CM | POA: Diagnosis not present

## 2020-08-23 DIAGNOSIS — Z8249 Family history of ischemic heart disease and other diseases of the circulatory system: Secondary | ICD-10-CM | POA: Diagnosis not present

## 2020-08-23 DIAGNOSIS — E785 Hyperlipidemia, unspecified: Secondary | ICD-10-CM | POA: Diagnosis present

## 2020-08-23 DIAGNOSIS — Z538 Procedure and treatment not carried out for other reasons: Secondary | ICD-10-CM | POA: Diagnosis not present

## 2020-08-23 DIAGNOSIS — Z20822 Contact with and (suspected) exposure to covid-19: Secondary | ICD-10-CM | POA: Diagnosis present

## 2020-08-23 DIAGNOSIS — K56609 Unspecified intestinal obstruction, unspecified as to partial versus complete obstruction: Secondary | ICD-10-CM | POA: Diagnosis present

## 2020-08-23 DIAGNOSIS — J9811 Atelectasis: Secondary | ICD-10-CM | POA: Diagnosis not present

## 2020-08-23 DIAGNOSIS — Z79899 Other long term (current) drug therapy: Secondary | ICD-10-CM

## 2020-08-23 DIAGNOSIS — K5730 Diverticulosis of large intestine without perforation or abscess without bleeding: Secondary | ICD-10-CM | POA: Diagnosis not present

## 2020-08-23 DIAGNOSIS — K529 Noninfective gastroenteritis and colitis, unspecified: Secondary | ICD-10-CM | POA: Diagnosis not present

## 2020-08-23 DIAGNOSIS — G8918 Other acute postprocedural pain: Secondary | ICD-10-CM | POA: Diagnosis not present

## 2020-08-23 HISTORY — DX: Diverticulitis of intestine, part unspecified, without perforation or abscess without bleeding: K57.92

## 2020-08-23 LAB — COMPREHENSIVE METABOLIC PANEL
ALT: 12 U/L (ref 0–44)
AST: 15 U/L (ref 15–41)
Albumin: 3.4 g/dL — ABNORMAL LOW (ref 3.5–5.0)
Alkaline Phosphatase: 62 U/L (ref 38–126)
Anion gap: 12 (ref 5–15)
BUN: 40 mg/dL — ABNORMAL HIGH (ref 8–23)
CO2: 24 mmol/L (ref 22–32)
Calcium: 11.4 mg/dL — ABNORMAL HIGH (ref 8.9–10.3)
Chloride: 88 mmol/L — ABNORMAL LOW (ref 98–111)
Creatinine, Ser: 2.04 mg/dL — ABNORMAL HIGH (ref 0.44–1.00)
GFR, Estimated: 26 mL/min — ABNORMAL LOW (ref >60)
Glucose, Bld: 109 mg/dL — ABNORMAL HIGH (ref 70–99)
Potassium: 3.7 mmol/L (ref 3.5–5.1)
Sodium: 124 mmol/L — ABNORMAL LOW (ref 135–145)
Total Bilirubin: 2 mg/dL — ABNORMAL HIGH (ref 0.3–1.2)
Total Protein: 7.5 g/dL (ref 6.5–8.1)

## 2020-08-23 LAB — LIPASE, BLOOD: Lipase: 20 U/L (ref 11–51)

## 2020-08-23 LAB — CBC
HCT: 42.6 % (ref 36.0–46.0)
Hemoglobin: 15 g/dL (ref 12.0–15.0)
MCH: 33.2 pg (ref 26.0–34.0)
MCHC: 35.2 g/dL (ref 30.0–36.0)
MCV: 94.2 fL (ref 80.0–100.0)
Platelets: 443 10*3/uL — ABNORMAL HIGH (ref 150–400)
RBC: 4.52 MIL/uL (ref 3.87–5.11)
RDW: 11.9 % (ref 11.5–15.5)
WBC: 19.1 10*3/uL — ABNORMAL HIGH (ref 4.0–10.5)
nRBC: 0 % (ref 0.0–0.2)

## 2020-08-23 LAB — LACTIC ACID, PLASMA
Lactic Acid, Venous: 2 mmol/L (ref 0.5–1.9)
Lactic Acid, Venous: 2 mmol/L (ref 0.5–1.9)

## 2020-08-23 LAB — RESP PANEL BY RT-PCR (FLU A&B, COVID) ARPGX2
Influenza A by PCR: NEGATIVE
Influenza B by PCR: NEGATIVE
SARS Coronavirus 2 by RT PCR: NEGATIVE

## 2020-08-23 MED ORDER — SODIUM CHLORIDE 0.9 % IV SOLN
2.0000 g | INTRAVENOUS | Status: DC
  Administered 2020-08-23: 2 g via INTRAVENOUS
  Filled 2020-08-23: qty 2

## 2020-08-23 MED ORDER — PANTOPRAZOLE SODIUM 40 MG PO TBEC
40.0000 mg | DELAYED_RELEASE_TABLET | Freq: Every day | ORAL | Status: DC
  Administered 2020-08-24 – 2020-08-25 (×2): 40 mg via ORAL
  Filled 2020-08-23 (×2): qty 1

## 2020-08-23 MED ORDER — SORBITOL 70 % SOLN
960.0000 mL | TOPICAL_OIL | Freq: Once | ORAL | Status: AC
  Administered 2020-08-23: 960 mL via RECTAL
  Filled 2020-08-23: qty 473

## 2020-08-23 MED ORDER — SODIUM CHLORIDE 0.9 % IV BOLUS
1000.0000 mL | Freq: Once | INTRAVENOUS | Status: AC
  Administered 2020-08-23: 1000 mL via INTRAVENOUS

## 2020-08-23 MED ORDER — CIPROFLOXACIN IN D5W 400 MG/200ML IV SOLN
400.0000 mg | Freq: Two times a day (BID) | INTRAVENOUS | Status: DC
  Administered 2020-08-23: 400 mg via INTRAVENOUS
  Filled 2020-08-23: qty 200

## 2020-08-23 MED ORDER — FENTANYL CITRATE PF 50 MCG/ML IJ SOSY
12.5000 ug | PREFILLED_SYRINGE | INTRAMUSCULAR | Status: DC | PRN
  Administered 2020-08-24: 25 ug via INTRAVENOUS
  Administered 2020-08-24: 50 ug via INTRAVENOUS
  Filled 2020-08-23 (×3): qty 1

## 2020-08-23 MED ORDER — ACETAMINOPHEN 650 MG RE SUPP
650.0000 mg | Freq: Four times a day (QID) | RECTAL | Status: DC | PRN
Start: 2020-08-23 — End: 2020-08-29

## 2020-08-23 MED ORDER — HEPARIN SODIUM (PORCINE) 5000 UNIT/ML IJ SOLN
5000.0000 [IU] | Freq: Three times a day (TID) | INTRAMUSCULAR | Status: DC
  Administered 2020-08-23 – 2020-08-25 (×5): 5000 [IU] via SUBCUTANEOUS
  Filled 2020-08-23 (×5): qty 1

## 2020-08-23 MED ORDER — ONDANSETRON HCL 4 MG/2ML IJ SOLN
4.0000 mg | Freq: Four times a day (QID) | INTRAMUSCULAR | Status: DC | PRN
  Administered 2020-08-25 (×2): 4 mg via INTRAVENOUS
  Filled 2020-08-23 (×2): qty 2

## 2020-08-23 MED ORDER — MORPHINE SULFATE (PF) 4 MG/ML IV SOLN
4.0000 mg | Freq: Once | INTRAVENOUS | Status: AC
  Administered 2020-08-23: 4 mg via INTRAVENOUS
  Filled 2020-08-23: qty 1

## 2020-08-23 MED ORDER — ONDANSETRON HCL 4 MG/2ML IJ SOLN
4.0000 mg | Freq: Once | INTRAMUSCULAR | Status: AC
  Administered 2020-08-23: 4 mg via INTRAVENOUS
  Filled 2020-08-23: qty 2

## 2020-08-23 MED ORDER — SODIUM CHLORIDE 0.9 % IV SOLN: INTRAVENOUS | Status: AC

## 2020-08-23 MED ORDER — PANTOPRAZOLE SODIUM 40 MG IV SOLR
40.0000 mg | Freq: Once | INTRAVENOUS | Status: AC
  Administered 2020-08-23: 40 mg via INTRAVENOUS
  Filled 2020-08-23: qty 40

## 2020-08-23 MED ORDER — ACETAMINOPHEN 325 MG PO TABS
650.0000 mg | ORAL_TABLET | Freq: Four times a day (QID) | ORAL | Status: DC | PRN
  Administered 2020-08-23: 650 mg via ORAL
  Filled 2020-08-23 (×2): qty 2

## 2020-08-23 MED ORDER — ONDANSETRON HCL 4 MG PO TABS: 4.0000 mg | ORAL_TABLET | Freq: Four times a day (QID) | ORAL | Status: DC | PRN

## 2020-08-23 MED ORDER — METRONIDAZOLE 500 MG/100ML IV SOLN
500.0000 mg | Freq: Three times a day (TID) | INTRAVENOUS | Status: DC
  Administered 2020-08-23 – 2020-08-27 (×12): 500 mg via INTRAVENOUS
  Filled 2020-08-23 (×12): qty 100

## 2020-08-23 NOTE — ED Provider Notes (Signed)
[facility] EMERGENCY DEPARTMENT Provider Note   CSN: [id number] Arrival date & time: 08/23/20  1206     History Chief Complaint  Patient presents with   Abdominal Pain    Anne Aguirre is a 70 y.o. female.  Pt presents to the ED today with abdominal pain, constipation, and n/v.  The pt said she has not had a bm in 6 days.  She has tried Dulcolax and other laxatives.  Pt denies f/c.  She initially had some back pain and it hurt to have a bowel movement, so she did not want to go.  Now she feels like she needs to deliver a bm.      Past Medical History:  Diagnosis Date   Essential hypertension, benign 10/01/2018   HLD (hyperlipidemia) 10/01/2018   Osteoarthritis 10/01/2018   Vitamin D deficiency disease 10/01/2018    Patient Active Problem List   Diagnosis Date Noted   Acute diverticulitis 08/23/2020   Acute renal failure superimposed on stage 3a chronic kidney disease (HCC) 08/23/2020   Hyponatremia 08/23/2020   Essential hypertension, benign 10/01/2018   HLD (hyperlipidemia) 10/01/2018   Vitamin D deficiency disease 10/01/2018   Osteoarthritis 10/01/2018    Past Surgical History:  Procedure Laterality Date   TONSILLECTOMY Bilateral      OB History   No obstetric history on file.     Family History  Problem Relation Age of Onset   Hypertension Mother    Stroke Mother    Stroke Father    Hypertension Brother    Hypertension Son     Social History   Tobacco Use   Smoking status: Never   Smokeless tobacco: Never  Vaping Use   Vaping Use: Never used  Substance Use Topics   Alcohol use: Yes    Alcohol/week: 7.0 standard drinks    Types: 7 Glasses of wine per week    Comment: Red wine   Drug use: Never    Home Medications Prior to Admission medications   Medication Sig Start Date End Date Taking? Authorizing Provider  amLODipine (NORVASC) 10 MG tablet Take 1 tablet (10 mg total) by mouth daily. 03/30/20  Yes Gosrani, Nimish C, MD  Cholecalciferol  (VITAMIN D-3) 125 MCG (5000 UT) TABS Take 10,000 Units by mouth daily.   Yes [provider]  irbesartan (AVAPRO) 300 MG tablet Take 1 tablet (300 mg total) by mouth daily. 05/30/20  Yes Gosrani, Nimish C, MD  Nebivolol HCl 20 MG TABS Take 1 tablet (20 mg total) by mouth daily at 12 noon. 05/30/20  Yes Wilson Singer, MD    Allergies    Patient has no known allergies.  Review of Systems   Review of Systems  Gastrointestinal:  Positive for abdominal pain, nausea and vomiting.  All other systems reviewed and are negative.  Physical Exam Updated Vital Signs BP (!) 92/54   Pulse 68   Temp 98.9 F (37.2 C) (Oral)   Resp 18   Ht 5\' 6"  (1.676 m)   Wt 56.2 kg   SpO2 98%   BMI 20.01 kg/m   Physical Exam Vitals and nursing note reviewed.  Constitutional:      Appearance: She is well-developed.  HENT:     Head: Normocephalic and atraumatic.     Mouth/Throat:     Mouth: Mucous membranes are moist.     Pharynx: Oropharynx is clear.  Eyes:     Extraocular Movements: Extraocular movements intact.     Pupils: Pupils are equal,  round, and reactive to light.  Cardiovascular:     Rate and Rhythm: Normal rate and regular rhythm.  Pulmonary:     Effort: Pulmonary effort is normal.     Breath sounds: Normal breath sounds.  Abdominal:     General: Abdomen is flat. Bowel sounds are normal.     Palpations: Abdomen is soft.     Tenderness: There is abdominal tenderness in the right lower quadrant and left lower quadrant.  Skin:    General: Skin is warm.     Capillary Refill: Capillary refill takes less than 2 seconds.  Neurological:     General: No focal deficit present.     Mental Status: She is alert and oriented to person, place, and time.  Psychiatric:        Mood and Affect: Mood normal.        Behavior: Behavior normal.    ED Results / Procedures / Treatments   Labs (all labs ordered are listed, but only abnormal results are displayed) Labs Reviewed  COMPREHENSIVE  METABOLIC PANEL - Abnormal; Notable for the following components:      Result Value   Sodium 124 (*)    Chloride 88 (*)    Glucose, Bld 109 (*)    BUN 40 (*)    Creatinine, Ser 2.04 (*)    Calcium 11.4 (*)    Albumin 3.4 (*)    Total Bilirubin 2.0 (*)    GFR, Estimated 26 (*)    All other components within normal limits  CBC - Abnormal; Notable for the following components:   WBC 19.1 (*)    Platelets 443 (*)    All other components within normal limits  LACTIC ACID, PLASMA - Abnormal; Notable for the following components:   Lactic Acid, Venous 2.0 (*)    All other components within normal limits  RESP PANEL BY RT-PCR (FLU A&B, COVID) [id number]  CULTURE, BLOOD (ROUTINE X 2)  CULTURE, BLOOD (ROUTINE X 2)  LIPASE, BLOOD  URINALYSIS, ROUTINE W REFLEX MICROSCOPIC  LACTIC ACID, PLASMA    EKG None  Radiology CT ABDOMEN PELVIS WO CONTRAST  Result Date: 08/23/2020 CLINICAL DATA:  Lower abdominal pain EXAM: CT ABDOMEN AND PELVIS WITHOUT CONTRAST TECHNIQUE: Multidetector CT imaging of the abdomen and pelvis was performed following the standard protocol without IV contrast. COMPARISON:  None. FINDINGS: Lower chest: Lung bases demonstrate no acute consolidation or effusion. Partially visualized breast prostheses. Mild circumferential thickening of the distal esophagus. Hepatobiliary: Hypodense liver lesions likely cysts. Additional subcentimeter hypodensities too small to further characterize. No calcified gallstone or biliary dilatation Pancreas: Unremarkable. No pancreatic ductal dilatation or surrounding inflammatory changes. Spleen: Normal in size without focal abnormality. Adrenals/Urinary Tract: Adrenal glands are normal. Kidneys show no hydronephrosis. The bladder is slightly thick walled Stomach/Bowel: The stomach is nonenlarged. No dilated small bowel. Fluid distension of the colon. Mild wall thickening with inflammatory change at the sigmoid colon with diverticula present.  Vascular/Lymphatic: Mild to moderate aortic atherosclerosis. No aneurysm. No suspicious nodes. Reproductive: Uterus and bilateral adnexa are unremarkable. Other: No free air.  Small fluid in the pelvis Musculoskeletal: No acute osseous abnormality. Degenerative changes of the spine. IMPRESSION: 1. Mild wall thickening with associated inflammatory change at sigmoid colon with diverticula present, likely reflecting an acute diverticulitis. No definite perforation or abscess. 2. Moderate fluid distension of the colon upstream to the area of sigmoid colon inflammation. No evidence for small bowel obstruction. 3. Mild circumferential distal esophageal thickening questionable for reflux or esophagitis.  Electronically Signed   By: Jasmine Pang M.D.   On: 08/23/2020 16:34   DG Chest 2 View  Result Date: 08/23/2020 CLINICAL DATA:  Nausea vomiting EXAM: CHEST - 2 VIEW COMPARISON:  None. FINDINGS: The heart size and mediastinal contours are within normal limits. Both lungs are clear. The visualized skeletal structures are unremarkable. IMPRESSION: No active cardiopulmonary disease. Electronically Signed   By: Jasmine Pang M.D.   On: 08/23/2020 16:34    Procedures Procedures   Medications Ordered in ED Medications  ciprofloxacin (CIPRO) IVPB 400 mg (0 mg Intravenous Stopped 08/23/20 1832)  metroNIDAZOLE (FLAGYL) IVPB 500 mg (0 mg Intravenous Stopped 08/23/20 1941)  pantoprazole (PROTONIX) injection 40 mg (has no administration in time range)  sodium chloride 0.9 % bolus 1,000 mL (0 mLs Intravenous Stopped 08/23/20 1707)  morphine 4 MG/ML injection 4 mg (4 mg Intravenous Given 08/23/20 1533)  ondansetron (ZOFRAN) injection 4 mg (4 mg Intravenous Given 08/23/20 1533)  sorbitol, milk of mag, mineral oil, glycerin (SMOG) enema (960 mLs Rectal Given 08/23/20 1842)  sodium chloride 0.9 % bolus 1,000 mL (0 mLs Intravenous Stopped 08/23/20 2032)    ED Course  I have reviewed the triage vital signs and the nursing  notes.  Pertinent labs & imaging results that were available during my care of the patient were reviewed by me and considered in my medical decision making (see chart for details).    MDM Rules/Calculators/A&P                           Pt is in AKI.  She is given IVFs.  BP a little low (hx of htn).  She is given additional IVFs and blood cultures drawn.    Pt has hypercalcemia.  Fluids will help with that.  Pt does have diverticulitis on CT.  She is given cipro/flagyl.    Due to feelings of constipation, pt given a smog enema with no output.  CT showed no significant stool.  She probably has not had a bowel movement because she has not eaten anything.  CT also showed some esophagitis.  She is given protonix IV.  Covid neg.  Pt still with pain and bp still soft.  She is d/w Dr. Antionette Char ([facility]) for admission.  Final Clinical Impression(s) / ED Diagnoses Final diagnoses:  Diverticulitis  AKI (acute kidney injury) (HCC)  Dehydration  Hypercalcemia  Esophagitis    Rx / DC Orders ED Discharge Orders     None        Jacalyn Lefevre, MD 08/23/20 2038

## 2020-08-23 NOTE — ED Notes (Addendum)
Hospitalist paged about pt BP. Waiting to hear back.

## 2020-08-23 NOTE — ED Notes (Signed)
Date and time results received: 08/23/20 0830   Test: Lactic Critical Value: 2.0  Name of Provider Notified: haviland  Orders Received? Or Actions Taken?: NA

## 2020-08-23 NOTE — ED Triage Notes (Signed)
Pt c/o abdominal pain that began 6 days ago when she had her last BM. Pt endorses n/v.

## 2020-08-23 NOTE — ED Notes (Signed)
Hospitalist paged due to pt BP trending now. Spoke on phone and to order another bolus of fluid. Will continue to monitor.

## 2020-08-23 NOTE — ED Notes (Addendum)
This RN, lab and 2 other RN's attempted second set of blood cultures. Spoke with EDP and will cancel second culture.

## 2020-08-23 NOTE — Progress Notes (Signed)
Pharmacy Antibiotic Note  Anne Aguirre a 70 y.o. female admitted on 08/23/2020 with  intra abdominal infection .  Pharmacy has been consulted for cefepime dosing.  Plan: Cefepime 2gm iv q24h  Medical History: Past Medical History:  Diagnosis Date   Essential hypertension, benign 10/01/2018   HLD (hyperlipidemia) 10/01/2018   Osteoarthritis 10/01/2018   Vitamin D deficiency disease 10/01/2018    Allergies:  No Known Allergies  Filed Weights   08/23/20 1314  Weight: 56.2 kg (124 lb)    CBC Latest Ref Rng & Units 08/23/2020 03/20/2020  WBC 4.0 - 10.5 K/uL 19.1(H) 9.7  Hemoglobin 12.0 - 15.0 g/dL 82.8 16.6(H)  Hematocrit 36.0 - 46.0 % 42.6 46.8(H)  Platelets 150 - 400 K/uL 443(H) 312     Estimated Creatinine Clearance: 23.4 mL/min (A) (by C-G formula based on SCr of 2.04 mg/dL (H)).  Antibiotics Given (last 72 hours)     Date/Time Action Medication Dose Rate   08/23/20 1716 New Bag/Given   ciprofloxacin (CIPRO) IVPB 400 mg 400 mg 200 mL/hr   08/23/20 1841 New Bag/Given   metroNIDAZOLE (FLAGYL) IVPB 500 mg 500 mg 100 mL/hr       Antimicrobials this admission: Cefepime 08/23/20 >>  Metronidazole 08/23/20 >>  Microbiology results: 08/23/20 BCx: sent 08/23/20 Resp Panel: sent   Thank you for allowing pharmacy to be a part of this patient's care.  Luan Pulling, PharmD Clinical Pharmacist

## 2020-08-23 NOTE — H&P (Signed)
History and Physical    Anne Aguirre ION:[id number] DOB: [birth year] DOA: 08/23/2020  PCP: Pcp, No   Patient coming from: home   Chief Complaint: Abdominal pain, N/V   HPI: Anne Aguirre is a 70 y.o. female with medical history significant for hypertension, now presenting to the emergency department for evaluation of abdominal pain, nausea, and vomiting.  Patient reports that she developed some cramping lower abdominal discomfort approximately 2 weeks ago which has been more frequent and intense over the past several days and associated with nausea and nonbloody vomiting.  She denies any melena or hematochezia but has not moved her bowels in 6 days.  She has been able to tolerate some sips of liquids but vomits shortly after any attempted eating.  She denies any associated fever, chills, chest pain, shortness of breath, or cough.  She has never experienced this previously.  She has never had a colonoscopy.  ED Course: Upon arrival to the ED, patient is found to be afebrile, saturating well on room air, and with blood pressure 89/58.  Chemistry panel notable for sodium 124, BUN 40, creatinine 2.04, calcium 11.4, and total bilirubin 2.0.  CBC notable for leukocytosis to 19,100 and mild thrombocytosis.  Chest x-rays negative for acute findings.  CT of the abdomen pelvis concerning for mild wall thickening with associated inflammatory changes at the sigmoid colon where there are diverticula noted and findings suspected to reflect acute diverticulitis without definite abscess or perforation.  Blood culture was collected, IV fluids and analgesics administered, and patient started on empiric antibiotics in the ED.  Review of Systems:  All other systems reviewed and apart from HPI, are negative.  Past Medical History:  Diagnosis Date   Essential hypertension, benign 10/01/2018   HLD (hyperlipidemia) 10/01/2018   Osteoarthritis 10/01/2018   Vitamin D deficiency disease 10/01/2018    Past Surgical History:   Procedure Laterality Date   TONSILLECTOMY Bilateral     Social History:   reports that she has never smoked. She has never used smokeless tobacco. She reports current alcohol use of about 7.0 standard drinks of alcohol per week. She reports that she does not use drugs.  No Known Allergies  Family History  Problem Relation Age of Onset   Hypertension Mother    Stroke Mother    Stroke Father    Hypertension Brother    Hypertension Son      Prior to Admission medications   Medication Sig Start Date End Date Taking? Authorizing Provider  amLODipine (NORVASC) 10 MG tablet Take 1 tablet (10 mg total) by mouth daily. 03/30/20  Yes Gosrani, Nimish C, MD  Cholecalciferol (VITAMIN D-3) 125 MCG (5000 UT) TABS Take 10,000 Units by mouth daily.   Yes [provider]  irbesartan (AVAPRO) 300 MG tablet Take 1 tablet (300 mg total) by mouth daily. 05/30/20  Yes Gosrani, Nimish C, MD  Nebivolol HCl 20 MG TABS Take 1 tablet (20 mg total) by mouth daily at 12 noon. 05/30/20  Yes Wilson Singer, MD    Physical Exam: Vitals:   08/23/20 1731 08/23/20 1800 08/23/20 1830 08/23/20 2020  BP: (!) 89/58 92/63 91/62  (!) 92/54  Pulse: 78 69 68 68  Resp: 18  18 18   Temp:      TempSrc:      SpO2: 96% 99% 91% 98%  Weight:      Height:        Constitutional: NAD, calm  Eyes: PERTLA, lids and conjunctivae normal ENMT: Mucous membranes are  moist. Posterior pharynx clear of any exudate or lesions.   Neck: supple, no masses  Respiratory: no wheezing, no crackles. No accessory muscle use.  Cardiovascular: S1 & S2 heard, regular rate and rhythm. No extremity edema.   Abdomen: No distension, soft, tender in LLQ, no rebound pain or guarding. Bowel sounds active.  Musculoskeletal: no clubbing / cyanosis. No joint deformity upper and lower extremities.   Skin: no significant rashes, lesions, ulcers. Warm, dry, well-perfused. Neurologic: CN 2-12 grossly intact. Sensation intact. Moving all  extremities.  Psychiatric: Alert and oriented to person, place, and situation. Very pleasant and cooperative.    Labs and Imaging on Admission: I have personally reviewed following labs and imaging studies  CBC: Recent Labs  Lab 08/23/20 1341  WBC 19.1*  HGB 15.0  HCT 42.6  MCV 94.2  PLT 443*   Basic Metabolic Panel: Recent Labs  Lab 08/23/20 1341  NA 124*  K 3.7  CL 88*  CO2 24  GLUCOSE 109*  BUN 40*  CREATININE 2.04*  CALCIUM 11.4*   GFR: Estimated Creatinine Clearance: 23.4 mL/min (A) (by C-G formula based on SCr of 2.04 mg/dL (H)). Liver Function Tests: Recent Labs  Lab 08/23/20 1341  AST 15  ALT 12  ALKPHOS 62  BILITOT 2.0*  PROT 7.5  ALBUMIN 3.4*   Recent Labs  Lab 08/23/20 1341  LIPASE 20   No results for input(s): AMMONIA in the last 168 hours. Coagulation Profile: No results for input(s): INR, PROTIME in the last 168 hours. Cardiac Enzymes: No results for input(s): CKTOTAL, CKMB, CKMBINDEX, TROPONINI in the last 168 hours. BNP (last 3 results) No results for input(s): PROBNP in the last 8760 hours. HbA1C: No results for input(s): HGBA1C in the last 72 hours. CBG: No results for input(s): GLUCAP in the last 168 hours. Lipid Profile: No results for input(s): CHOL, HDL, LDLCALC, TRIG, CHOLHDL, LDLDIRECT in the last 72 hours. Thyroid Function Tests: No results for input(s): TSH, T4TOTAL, FREET4, T3FREE, THYROIDAB in the last 72 hours. Anemia Panel: No results for input(s): VITAMINB12, FOLATE, FERRITIN, TIBC, IRON, RETICCTPCT in the last 72 hours. Urine analysis: No results found for: COLORURINE, APPEARANCEUR, LABSPEC, PHURINE, GLUCOSEU, HGBUR, BILIRUBINUR, KETONESUR, PROTEINUR, UROBILINOGEN, NITRITE, LEUKOCYTESUR Sepsis Labs: @LABRCNTIP (procalcitonin:4,lacticidven:4) ) Recent Results (from the past 240 hour(s))  Resp Panel by RT-PCR (Flu A&B, Covid) Nasopharyngeal Swab     Status: None   Collection Time: 08/23/20  3:35 PM   Specimen:  Nasopharyngeal Swab; Nasopharyngeal(NP) swabs in vial transport medium  Result Value Ref Range Status   SARS Coronavirus 2 by RT PCR NEGATIVE NEGATIVE Final    Comment: (NOTE) SARS-CoV-2 target nucleic acids are NOT DETECTED.  The SARS-CoV-2 RNA is generally detectable in upper respiratory specimens during the acute phase of infection. The lowest concentration of SARS-CoV-2 viral copies this assay can detect is 138 copies/mL. A negative result does not preclude SARS-Cov-2 infection and should not be used as the sole basis for treatment or other patient management decisions. A negative result may occur with  improper specimen collection/handling, submission of specimen other than nasopharyngeal swab, presence of viral mutation(s) within the areas targeted by this assay, and inadequate number of viral copies(<138 copies/mL). A negative result must be combined with clinical observations, patient history, and epidemiological information. The expected result is Negative.  Fact Sheet for Patients:  BloggerCourse.comhttps://www.fda.gov/media/152166/download  Fact Sheet for Healthcare Providers:  SeriousBroker.ithttps://www.fda.gov/media/152162/download  This test is no t yet approved or cleared by the [place] FDA and  has been  authorized for detection and/or diagnosis of SARS-CoV-2 by FDA under an Emergency Use Authorization (EUA). This EUA will remain  in effect (meaning this test can be used) for the duration of the COVID-19 declaration under Section 564(b)(1) of the Act, 21 U.S.C.section 360bbb-3(b)(1), unless the authorization is terminated  or revoked sooner.       Influenza A by PCR NEGATIVE NEGATIVE Final   Influenza B by PCR NEGATIVE NEGATIVE Final    Comment: (NOTE) The Xpert Xpress SARS-CoV-2/FLU/RSV plus assay is intended as an aid in the diagnosis of influenza from Nasopharyngeal swab specimens and should not be used as a sole basis for treatment. Nasal washings and aspirates are unacceptable for  Xpert Xpress SARS-CoV-2/FLU/RSV testing.  Fact Sheet for Patients: BloggerCourse.com  Fact Sheet for Healthcare Providers: SeriousBroker.it  This test is not yet approved or cleared by the [place] FDA and has been authorized for detection and/or diagnosis of SARS-CoV-2 by FDA under an Emergency Use Authorization (EUA). This EUA will remain in effect (meaning this test can be used) for the duration of the COVID-19 declaration under Section 564(b)(1) of the Act, 21 U.S.C. section 360bbb-3(b)(1), unless the authorization is terminated or revoked.  Performed at [facility], [address], [place], [place] [address]   Culture, blood (routine x 2)     Status: None (Preliminary result)   Collection Time: 08/23/20  8:07 PM   Specimen: BLOOD RIGHT ARM  Result Value Ref Range Status   Specimen Description BLOOD RIGHT ARM  Final   Special Requests   Final    Blood Culture results may not be optimal due to an inadequate volume of blood received in culture bottles BOTTLES DRAWN AEROBIC AND ANAEROBIC Performed at [facility], [address], [place], [place] [address]    Culture PENDING  Incomplete   Report Status PENDING  Incomplete     Radiological Exams on Admission: CT ABDOMEN PELVIS WO CONTRAST  Result Date: 08/23/2020 CLINICAL DATA:  Lower abdominal pain EXAM: CT ABDOMEN AND PELVIS WITHOUT CONTRAST TECHNIQUE: Multidetector CT imaging of the abdomen and pelvis was performed following the standard protocol without IV contrast. COMPARISON:  None. FINDINGS: Lower chest: Lung bases demonstrate no acute consolidation or effusion. Partially visualized breast prostheses. Mild circumferential thickening of the distal esophagus. Hepatobiliary: Hypodense liver lesions likely cysts. Additional subcentimeter hypodensities too small to further characterize. No calcified gallstone or biliary dilatation Pancreas: Unremarkable. No pancreatic ductal  dilatation or surrounding inflammatory changes. Spleen: Normal in size without focal abnormality. Adrenals/Urinary Tract: Adrenal glands are normal. Kidneys show no hydronephrosis. The bladder is slightly thick walled Stomach/Bowel: The stomach is nonenlarged. No dilated small bowel. Fluid distension of the colon. Mild wall thickening with inflammatory change at the sigmoid colon with diverticula present. Vascular/Lymphatic: Mild to moderate aortic atherosclerosis. No aneurysm. No suspicious nodes. Reproductive: Uterus and bilateral adnexa are unremarkable. Other: No free air.  Small fluid in the pelvis Musculoskeletal: No acute osseous abnormality. Degenerative changes of the spine. IMPRESSION: 1. Mild wall thickening with associated inflammatory change at sigmoid colon with diverticula present, likely reflecting an acute diverticulitis. No definite perforation or abscess. 2. Moderate fluid distension of the colon upstream to the area of sigmoid colon inflammation. No evidence for small bowel obstruction. 3. Mild circumferential distal esophageal thickening questionable for reflux or esophagitis. Electronically Signed   By: Jasmine Pang M.D.   On: 08/23/2020 16:34   DG Chest 2 View  Result Date: 08/23/2020 CLINICAL DATA:  Nausea vomiting EXAM: CHEST - 2  VIEW COMPARISON:  None. FINDINGS: The heart size and mediastinal contours are within normal limits. Both lungs are clear. The visualized skeletal structures are unremarkable. IMPRESSION: No active cardiopulmonary disease. Electronically Signed   By: Jasmine Pang M.D.   On: 08/23/2020 16:34     Assessment/Plan   1. Acute sigmoid diverticulitis  - Pt presents with cramping lower abdominal pain and has CT findings suggestive of acute sigmoid diverticulitis without evidence for perforation or abscess  - Continue IVF hydration, antibiotics, pain-control, clear liquid diet for now    2. Acute kidney injury superimposed on CKD IIIa  - SCr is 2.04 on  admission, up from baseline of ~1  - No hydronephrosis on CT in ED  - Likely acute prerenal azotemia in setting of anorexia and N/V  - Continue IVF hydration, hold ARB, renally-dose medications, monitor   3. Hyponatremia  - Serum sodium is 124 on admission in setting of hypovolemia  - Continue IVF hydration and trend    4. Hypercalcemia  - Serum calcium is 11.4 on admission  - Likely related to severe dehydration, plan to continue IVF hydration and repeat chemistries in am    5. Hyperbilirubinemia  - Total bilirubin is 2.0 on admission, previously normal   - Likely liver cysts noted on CT, no other hepatobiliary abnormality  - Repeat LFTs in am    6. Hypertension  - BP low in ED and antihypertensives will be held initially    DVT prophylaxis: sq heparin  Code Status: Full  Level of Care: Level of care: Telemetry Family Communication: None present  Disposition Plan:  Patient is from: home  Anticipated d/c is to: Home  Anticipated d/c date is: 08/26/20 Patient currently: Pending improvement in renal function, improvement in abd pain, tolerance of adequate PO  Consults called: None  Admission status: Inpatient     Briscoe Deutscher, MD [facility]  08/23/2020, 8:53 PM

## 2020-08-24 DIAGNOSIS — K5792 Diverticulitis of intestine, part unspecified, without perforation or abscess without bleeding: Secondary | ICD-10-CM | POA: Diagnosis not present

## 2020-08-24 DIAGNOSIS — N179 Acute kidney failure, unspecified: Secondary | ICD-10-CM | POA: Diagnosis not present

## 2020-08-24 LAB — HEPATIC FUNCTION PANEL
ALT: 11 U/L (ref 0–44)
AST: 18 U/L (ref 15–41)
Albumin: 2.6 g/dL — ABNORMAL LOW (ref 3.5–5.0)
Alkaline Phosphatase: 47 U/L (ref 38–126)
Bilirubin, Direct: 0.4 mg/dL — ABNORMAL HIGH (ref 0.0–0.2)
Indirect Bilirubin: 0.9 mg/dL (ref 0.3–0.9)
Total Bilirubin: 1.3 mg/dL — ABNORMAL HIGH (ref 0.3–1.2)
Total Protein: 5.8 g/dL — ABNORMAL LOW (ref 6.5–8.1)

## 2020-08-24 LAB — CBC
HCT: 37.7 % (ref 36.0–46.0)
Hemoglobin: 12.6 g/dL (ref 12.0–15.0)
MCH: 32.7 pg (ref 26.0–34.0)
MCHC: 33.4 g/dL (ref 30.0–36.0)
MCV: 97.9 fL (ref 80.0–100.0)
Platelets: 341 10*3/uL (ref 150–400)
RBC: 3.85 MIL/uL — ABNORMAL LOW (ref 3.87–5.11)
RDW: 12.1 % (ref 11.5–15.5)
WBC: 9.7 10*3/uL (ref 4.0–10.5)
nRBC: 0 % (ref 0.0–0.2)

## 2020-08-24 LAB — BASIC METABOLIC PANEL
Anion gap: 8 (ref 5–15)
BUN: 30 mg/dL — ABNORMAL HIGH (ref 8–23)
CO2: 21 mmol/L — ABNORMAL LOW (ref 22–32)
Calcium: 8.7 mg/dL — ABNORMAL LOW (ref 8.9–10.3)
Chloride: 106 mmol/L (ref 98–111)
Creatinine, Ser: 1.42 mg/dL — ABNORMAL HIGH (ref 0.44–1.00)
GFR, Estimated: 40 mL/min — ABNORMAL LOW (ref >60)
Glucose, Bld: 85 mg/dL (ref 70–99)
Potassium: 3.2 mmol/L — ABNORMAL LOW (ref 3.5–5.1)
Sodium: 135 mmol/L (ref 135–145)

## 2020-08-24 LAB — PHOSPHORUS: Phosphorus: <1 mg/dL — CL (ref 2.5–4.6)

## 2020-08-24 LAB — LACTIC ACID, PLASMA: Lactic Acid, Venous: 1.4 mmol/L (ref 0.5–1.9)

## 2020-08-24 LAB — MRSA NEXT GEN BY PCR, NASAL: MRSA by PCR Next Gen: NOT DETECTED

## 2020-08-24 LAB — MAGNESIUM: Magnesium: 1.4 mg/dL — ABNORMAL LOW (ref 1.7–2.4)

## 2020-08-24 LAB — HIV ANTIBODY (ROUTINE TESTING W REFLEX): HIV Screen 4th Generation wRfx: NONREACTIVE

## 2020-08-24 MED ORDER — CHLORHEXIDINE GLUCONATE CLOTH 2 % EX PADS
6.0000 | MEDICATED_PAD | Freq: Every day | CUTANEOUS | Status: DC
  Administered 2020-08-24 – 2020-09-08 (×15): 6 via TOPICAL

## 2020-08-24 MED ORDER — SODIUM CHLORIDE 0.9 % IV SOLN
2.0000 g | Freq: Two times a day (BID) | INTRAVENOUS | Status: DC
  Administered 2020-08-24 – 2020-08-27 (×6): 2 g via INTRAVENOUS
  Filled 2020-08-24 (×7): qty 2

## 2020-08-24 MED ORDER — K PHOS MONO-SOD PHOS DI & MONO 155-852-130 MG PO TABS
500.0000 mg | ORAL_TABLET | Freq: Once | ORAL | Status: AC
  Administered 2020-08-24: 500 mg via ORAL
  Filled 2020-08-24: qty 2

## 2020-08-24 MED ORDER — BOOST / RESOURCE BREEZE PO LIQD CUSTOM
1.0000 | Freq: Three times a day (TID) | ORAL | Status: DC
Start: 2020-08-24 — End: 2020-08-25
  Administered 2020-08-24: 1 via ORAL

## 2020-08-24 MED ORDER — OXYCODONE HCL 5 MG PO TABS
5.0000 mg | ORAL_TABLET | ORAL | Status: DC | PRN
  Administered 2020-08-25: 5 mg via ORAL
  Filled 2020-08-24: qty 1

## 2020-08-24 MED ORDER — MORPHINE SULFATE (PF) 2 MG/ML IV SOLN: 2.0000 mg | INTRAVENOUS | Status: DC | PRN

## 2020-08-24 MED ORDER — POTASSIUM PHOSPHATES 15 MMOLE/5ML IV SOLN
20.0000 mmol | Freq: Once | INTRAVENOUS | Status: AC
  Administered 2020-08-24: 20 mmol via INTRAVENOUS
  Filled 2020-08-24: qty 6.67

## 2020-08-24 MED ORDER — MAGNESIUM SULFATE 2 GM/50ML IV SOLN
2.0000 g | Freq: Once | INTRAVENOUS | Status: AC
  Administered 2020-08-24: 2 g via INTRAVENOUS
  Filled 2020-08-24: qty 50

## 2020-08-24 NOTE — Progress Notes (Signed)
PROGRESS NOTE    Anne Aguirre  NFA:[id number] DOB: 11/23/1950 DOA: 08/23/2020 PCP: Pcp, No    Brief Narrative:  70 y.o. female with medical history significant for hypertension, now presenting to the emergency department for evaluation of abdominal pain, nausea, and vomiting.  Patient reports that she developed some cramping lower abdominal discomfort approximately 2 weeks ago which has been more frequent and intense over the past several days and associated with nausea and nonbloody vomiting.  She denies any melena or hematochezia but has not moved her bowels in 6 days.  She has been able to tolerate some sips of liquids but vomits shortly after any attempted eating. Pt was found to be septic with diverticulitis  Assessment & Plan:   Principal Problem:   Acute diverticulitis Active Problems:   Essential hypertension, benign   Acute renal failure superimposed on stage 3a chronic kidney disease (HCC)   Hyponatremia   Hyperbilirubinemia   Hypercalcemia  1. Acute sigmoid diverticulitis  - Pt presents with cramping lower abdominal pain and has CT findings suggestive of acute sigmoid diverticulitis without evidence for perforation or abscess  - Pt is continued on IVF hydration -Continue empiric flagyl and cefepime -Currently on clear diet, however pt is not tolerating at all this AM -recheck cbc in AM   2. Acute kidney injury superimposed on CKD IIIa  - SCr is 2.04 on admission, up from baseline of ~1  - No hydronephrosis on CT in ED  - Likely acute prerenal azotemia in setting of anorexia and N/V  - hold ARB, renally -continue IVF hydration -repeat bmet in AM   3. Hyponatremia  - Serum sodium is 124 on admission in setting of hypovolemia  - Normalized with IVF   4. Hypercalcemia  - Serum calcium is 11.4 on admission  - Likely related to severe dehydration, plan to continue IVF hydration and repeat chemistries in am     5. Hyperbilirubinemia  - Total bilirubin is 2.0 on  admission, previously normal   - Likely liver cysts noted on CT, no other hepatobiliary abnormality  - improving bili   6. Hypertension  - BP low in ED and antihypertensives will be held initially     DVT prophylaxis: Heparin subq Code Status: Full Family Communication: Pt in room, family not at bedside  Status is: Inpatient  Remains inpatient appropriate because:Inpatient level of care appropriate due to severity of illness  Dispo: The patient is from: Home              Anticipated d/c is to: Home              Patient currently is not medically stable to d/c.   Difficult to place patient No       Consultants:    Procedures:    Antimicrobials: Anti-infectives (From admission, onward)    Start     Dose/Rate Route Frequency Ordered Stop   08/24/20 1145  ceFEPIme (MAXIPIME) 2 g in sodium chloride 0.9 % 100 mL IVPB        2 g 200 mL/hr over 30 Minutes Intravenous Every 12 hours 08/24/20 1051     08/23/20 2100  ceFEPIme (MAXIPIME) 2 g in sodium chloride 0.9 % 100 mL IVPB  Status:  Discontinued        2 g 200 mL/hr over 30 Minutes Intravenous Every 24 hours 08/23/20 2051 08/24/20 1051   08/23/20 1700  ciprofloxacin (CIPRO) IVPB 400 mg  Status:  Discontinued  400 mg 200 mL/hr over 60 Minutes Intravenous Every 12 hours 08/23/20 1645 08/23/20 2041   08/23/20 1700  metroNIDAZOLE (FLAGYL) IVPB 500 mg        500 mg 100 mL/hr over 60 Minutes Intravenous Every 8 hours 08/23/20 1645         Subjective: Still complaining of abd discomfort, not able to tolerate broth this AM  Objective: Vitals:   08/24/20 1000 08/24/20 1100 08/24/20 1200 08/24/20 1300  BP: 95/62  (!) 90/59   Pulse: 75 77 74 78  Resp: (!) 24 (!) 29 (!) 32 (!) 29  Temp:      TempSrc:      SpO2: 96% 94% 91% 95%  Weight:      Height:        Intake/Output Summary (Last 24 hours) at 08/24/2020 1410 Last data filed at 08/24/2020 0400 Gross per 24 hour  Intake 4386.92 ml  Output --  Net 4386.92 ml    Filed Weights   08/23/20 1314 08/24/20 0400  Weight: 56.2 kg 61.4 kg    Examination: General exam: Awake, laying in bed, in nad Respiratory system: Normal respiratory effort, no wheezing Cardiovascular system: regular rate, s1, s2 Gastrointestinal system: Soft, nondistended, positive BS, generally tender Central nervous system: CN2-12 grossly intact, strength intact Extremities: Perfused, no clubbing Skin: Normal skin turgor, no notable skin lesions seen Psychiatry: Mood normal // no visual hallucinations   Data Reviewed: I have personally reviewed following labs and imaging studies  CBC: Recent Labs  Lab 08/23/20 1341 08/24/20 0405  WBC 19.1* 9.7  HGB 15.0 12.6  HCT 42.6 37.7  MCV 94.2 97.9  PLT 443* 341   Basic Metabolic Panel: Recent Labs  Lab 08/23/20 1341 08/24/20 0405  NA 124* 135  K 3.7 3.2*  CL 88* 106  CO2 24 21*  GLUCOSE 109* 85  BUN 40* 30*  CREATININE 2.04* 1.42*  CALCIUM 11.4* 8.7*  MG  --  1.4*  PHOS  --  <1.0*   GFR: Estimated Creatinine Clearance: 35.5 mL/min (A) (by C-G formula based on SCr of 1.42 mg/dL (H)). Liver Function Tests: Recent Labs  Lab 08/23/20 1341 08/24/20 0405  AST 15 18  ALT 12 11  ALKPHOS 62 47  BILITOT 2.0* 1.3*  PROT 7.5 5.8*  ALBUMIN 3.4* 2.6*   Recent Labs  Lab 08/23/20 1341  LIPASE 20   No results for input(s): AMMONIA in the last 168 hours. Coagulation Profile: No results for input(s): INR, PROTIME in the last 168 hours. Cardiac Enzymes: No results for input(s): CKTOTAL, CKMB, CKMBINDEX, TROPONINI in the last 168 hours. BNP (last 3 results) No results for input(s): PROBNP in the last 8760 hours. HbA1C: No results for input(s): HGBA1C in the last 72 hours. CBG: No results for input(s): GLUCAP in the last 168 hours. Lipid Profile: No results for input(s): CHOL, HDL, LDLCALC, TRIG, CHOLHDL, LDLDIRECT in the last 72 hours. Thyroid Function Tests: No results for input(s): TSH, T4TOTAL, FREET4, T3FREE,  THYROIDAB in the last 72 hours. Anemia Panel: No results for input(s): VITAMINB12, FOLATE, FERRITIN, TIBC, IRON, RETICCTPCT in the last 72 hours. Sepsis Labs: Recent Labs  Lab 08/23/20 2007 08/23/20 2215 08/24/20 0405  LATICACIDVEN 2.0* 2.0* 1.4    Recent Results (from the past 240 hour(s))  Resp Panel by RT-PCR (Flu A&B, Covid) Nasopharyngeal Swab     Status: None   Collection Time: 08/23/20  3:35 PM   Specimen: Nasopharyngeal Swab; Nasopharyngeal(NP) swabs in vial transport medium  Result Value  Ref Range Status   SARS Coronavirus 2 by RT PCR NEGATIVE NEGATIVE Final    Comment: (NOTE) SARS-CoV-2 target nucleic acids are NOT DETECTED.  The SARS-CoV-2 RNA is generally detectable in upper respiratory specimens during the acute phase of infection. The lowest concentration of SARS-CoV-2 viral copies this assay can detect is 138 copies/mL. A negative result does not preclude SARS-Cov-2 infection and should not be used as the sole basis for treatment or other patient management decisions. A negative result may occur with  improper specimen collection/handling, submission of specimen other than nasopharyngeal swab, presence of viral mutation(s) within the areas targeted by this assay, and inadequate number of viral copies(<138 copies/mL). A negative result must be combined with clinical observations, patient history, and epidemiological information. The expected result is Negative.  Fact Sheet for Patients:  BloggerCourse.com  Fact Sheet for Healthcare Providers:  SeriousBroker.it  This test is no t yet approved or cleared by the [place] FDA and  has been authorized for detection and/or diagnosis of SARS-CoV-2 by FDA under an Emergency Use Authorization (EUA). This EUA will remain  in effect (meaning this test can be used) for the duration of the COVID-19 declaration under Section 564(b)(1) of the Act, 21 U.S.C.section  360bbb-3(b)(1), unless the authorization is terminated  or revoked sooner.       Influenza A by PCR NEGATIVE NEGATIVE Final   Influenza B by PCR NEGATIVE NEGATIVE Final    Comment: (NOTE) The Xpert Xpress SARS-CoV-2/FLU/RSV plus assay is intended as an aid in the diagnosis of influenza from Nasopharyngeal swab specimens and should not be used as a sole basis for treatment. Nasal washings and aspirates are unacceptable for Xpert Xpress SARS-CoV-2/FLU/RSV testing.  Fact Sheet for Patients: BloggerCourse.com  Fact Sheet for Healthcare Providers: SeriousBroker.it  This test is not yet approved or cleared by the [place] FDA and has been authorized for detection and/or diagnosis of SARS-CoV-2 by FDA under an Emergency Use Authorization (EUA). This EUA will remain in effect (meaning this test can be used) for the duration of the COVID-19 declaration under Section 564(b)(1) of the Act, 21 U.S.C. section 360bbb-3(b)(1), unless the authorization is terminated or revoked.  Performed at [facility], [address], [place], [place] [address]   Culture, blood (routine x 2)     Status: None (Preliminary result)   Collection Time: 08/23/20  8:07 PM   Specimen: BLOOD RIGHT ARM  Result Value Ref Range Status   Specimen Description BLOOD RIGHT ARM  Final   Special Requests   Final    Blood Culture results may not be optimal due to an inadequate volume of blood received in culture bottles BOTTLES DRAWN AEROBIC AND ANAEROBIC Performed at [facility], [address], [place], [place] [address]    Culture PENDING  Incomplete   Report Status PENDING  Incomplete  MRSA Next Gen by PCR, Nasal     Status: None   Collection Time: 08/24/20  3:43 AM   Specimen: Nasal Mucosa; Nasal Swab  Result Value Ref Range Status   MRSA by PCR Next Gen NOT DETECTED NOT DETECTED Final    Comment: (NOTE) The GeneXpert MRSA Assay (FDA approved for NASAL  specimens only), is one component of a comprehensive MRSA colonization surveillance program. It is not intended to diagnose MRSA infection nor to guide or monitor treatment for MRSA infections. Test performance is not FDA approved in patients less than 52 years old. Performed at [facility], [address], [place], [place] [address]  Radiology Studies: CT ABDOMEN PELVIS WO CONTRAST  Result Date: 08/23/2020 CLINICAL DATA:  Lower abdominal pain EXAM: CT ABDOMEN AND PELVIS WITHOUT CONTRAST TECHNIQUE: Multidetector CT imaging of the abdomen and pelvis was performed following the standard protocol without IV contrast. COMPARISON:  None. FINDINGS: Lower chest: Lung bases demonstrate no acute consolidation or effusion. Partially visualized breast prostheses. Mild circumferential thickening of the distal esophagus. Hepatobiliary: Hypodense liver lesions likely cysts. Additional subcentimeter hypodensities too small to further characterize. No calcified gallstone or biliary dilatation Pancreas: Unremarkable. No pancreatic ductal dilatation or surrounding inflammatory changes. Spleen: Normal in size without focal abnormality. Adrenals/Urinary Tract: Adrenal glands are normal. Kidneys show no hydronephrosis. The bladder is slightly thick walled Stomach/Bowel: The stomach is nonenlarged. No dilated small bowel. Fluid distension of the colon. Mild wall thickening with inflammatory change at the sigmoid colon with diverticula present. Vascular/Lymphatic: Mild to moderate aortic atherosclerosis. No aneurysm. No suspicious nodes. Reproductive: Uterus and bilateral adnexa are unremarkable. Other: No free air.  Small fluid in the pelvis Musculoskeletal: No acute osseous abnormality. Degenerative changes of the spine. IMPRESSION: 1. Mild wall thickening with associated inflammatory change at sigmoid colon with diverticula present, likely reflecting an acute diverticulitis. No definite perforation or abscess. 2.  Moderate fluid distension of the colon upstream to the area of sigmoid colon inflammation. No evidence for small bowel obstruction. 3. Mild circumferential distal esophageal thickening questionable for reflux or esophagitis. Electronically Signed   By: Jasmine Pang M.D.   On: 08/23/2020 16:34   DG Chest 2 View  Result Date: 08/23/2020 CLINICAL DATA:  Nausea vomiting EXAM: CHEST - 2 VIEW COMPARISON:  None. FINDINGS: The heart size and mediastinal contours are within normal limits. Both lungs are clear. The visualized skeletal structures are unremarkable. IMPRESSION: No active cardiopulmonary disease. Electronically Signed   By: Jasmine Pang M.D.   On: 08/23/2020 16:34    Scheduled Meds:  Chlorhexidine Gluconate Cloth  6 each Topical Daily   feeding supplement  1 Container Oral TID BM   heparin  5,000 Units Subcutaneous Q8H   pantoprazole  40 mg Oral Daily   Continuous Infusions:  sodium chloride 150 mL/hr at 08/23/20 2248   ceFEPime (MAXIPIME) IV 2 g (08/24/20 1222)   metronidazole 500 mg (08/24/20 0811)   potassium PHOSPHATE IVPB (in mmol) 20 mmol (08/24/20 1106)     LOS: 1 day   Rickey Barbara, MD [facility] Pager On Amion  If 7PM-7AM, please contact night-coverage 08/24/2020, 2:10 PM

## 2020-08-24 NOTE — Progress Notes (Signed)
Pharmacy Antibiotic Note  Anne Aguirre a 70 y.o. female admitted on 08/24/2020 with  intra abdominal infection .  Pharmacy has been consulted for cefepime dosing. CrCl improved Diverticulitis Plan: Increase Cefepime 2gm iv q12h Also on flagyl 500mg  IV q8h F/U cxs and clinical progress Monitor V/s, labs  Medical History: Past Medical History:  Diagnosis Date   Essential hypertension, benign 10/01/2018   HLD (hyperlipidemia) 10/01/2018   Osteoarthritis 10/01/2018   Vitamin D deficiency disease 10/01/2018    Allergies:  No Known Allergies  Filed Weights   08/23/20 1314 08/24/20 0400  Weight: 56.2 kg (124 lb) 61.4 kg (135 lb 5.8 oz)    CBC Latest Ref Rng & Units 08/24/2020 08/23/2020 03/20/2020  WBC 4.0 - 10.5 K/uL 9.7 19.1(H) 9.7  Hemoglobin 12.0 - 15.0 g/dL 05/20/2020 16.1 16.6(H)  Hematocrit 36.0 - 46.0 % 37.7 42.6 46.8(H)  Platelets 150 - 400 K/uL 341 443(H) 312     Estimated Creatinine Clearance: 35.5 mL/min (A) (by C-G formula based on SCr of 1.42 mg/dL (H)).  Antibiotics Given (last 72 hours)     Date/Time Action Medication Dose Rate   08/23/20 1716 New Bag/Given   ciprofloxacin (CIPRO) IVPB 400 mg 400 mg 200 mL/hr   08/23/20 1841 New Bag/Given   metroNIDAZOLE (FLAGYL) IVPB 500 mg 500 mg 100 mL/hr   08/23/20 2125 New Bag/Given   ceFEPIme (MAXIPIME) 2 g in sodium chloride 0.9 % 100 mL IVPB 2 g 200 mL/hr   08/24/20 10/24/20 New Bag/Given   metroNIDAZOLE (FLAGYL) IVPB 500 mg 500 mg 100 mL/hr   08/24/20 10/24/20 New Bag/Given   metroNIDAZOLE (FLAGYL) IVPB 500 mg 500 mg 100 mL/hr       Antimicrobials this admission: Cefepime 08/23/20 >>  Metronidazole 08/23/20 >>  Microbiology results: 08/23/20 BCx: sent 08/23/20 MRSA PCR neg  Thank you for allowing pharmacy to be a part of this patient's care.  10/23/20, PharmD Clinical Pharmacist

## 2020-08-24 NOTE — Progress Notes (Signed)
Initial Nutrition Assessment  DOCUMENTATION CODES:   Not applicable  INTERVENTION:  Boost Breeze po TID, each supplement provides 250 kcal and 9 grams of protein   Follow for diet advancement   Nutrition education  NUTRITION DIAGNOSIS:   Inadequate oral intake related to acute illness (diverticulitis) as evidenced by per patient/family report, meal completion < 25%.    GOAL:  Patient will meet greater than or equal to 90% of their needs   MONITOR:  Diet advancement, PO intake, Supplement acceptance, Labs, Weight trends  REASON FOR ASSESSMENT:   Malnutrition Screening Tool    ASSESSMENT: Patient is a 70 yo female with history of hypertension, hyperlipidemia, osteoarthritis and vitamin D deficiency. Presents  with complaint of abdominal pain, constipation, nausea and vomiting 6 days.   Patient was able to take in sips of ginger ale, carnation instant breakfast, bites of yogurt and teaspoon of peanut butter. Currently she is tolerating clears not wanting to eat anything yet. Her lunch is here and encouraged fluids and trial of ONS pending diet advancement. Inadequate energy/protein intake based on pt diet recall the past week.  Patient weight history is stable at 59-61 kg. She reports usual range around 128 lb.  Medications reviewed.    Labs: hypokalemia, hypophosphatemia   BMP Latest Ref Rng & Units 08/24/2020 08/23/2020 04/04/2020  Glucose 70 - 99 mg/dL 85 465(K) 89  BUN 8 - 23 mg/dL 35(W) 65(K) 13  Creatinine 0.44 - 1.00 mg/dL 8.12(X) 5.17(G) 0.17(C)  BUN/Creat Ratio 6 - 22 (calc) - - 13  Sodium 135 - 145 mmol/L 135 124(L) 134(L)  Potassium 3.5 - 5.1 mmol/L 3.2(L) 3.7 4.1  Chloride 98 - 111 mmol/L 106 88(L) 97(L)  CO2 22 - 32 mmol/L 21(L) 24 24  Calcium 8.9 - 10.3 mg/dL 9.4(W) 11.4(H) 10.6(H)     NUTRITION - FOCUSED PHYSICAL EXAM: Nutrition-Focused physical exam completed. Findings are no fat depletion, no muscle depletion, and no edema.     Diet Order:   Diet  Order             Diet clear liquid Room service appropriate? Yes; Fluid consistency: Thin  Diet effective now                   EDUCATION NEEDS:  Education needs have been addressed  Skin:  Skin Assessment: Reviewed RN Assessment  Last BM:  8/5  Height:   Ht Readings from Last 1 Encounters:  08/23/20 5\' 6"  (1.676 m)    Weight:   Wt Readings from Last 1 Encounters:  08/24/20 61.4 kg    Ideal Body Weight:   59 kg  BMI:  Body mass index is 21.85 kg/m.  Estimated Nutritional Needs:   Kcal:  1700-1800  Protein:  79-85 gr  Fluid:  1800 ml daily  10/24/20 MS,RD,CSG,LDN Contact: Royann Shivers

## 2020-08-24 NOTE — Progress Notes (Signed)
Date and time results received: 08/24/20 0538 (use smartphrase ".now" to insert current time)  Test: BMP Critical Value: Phosphorus <1  Name of Provider Notified: Opyd  Orders Received? Or Actions Taken?: Awaiting orders

## 2020-08-25 ENCOUNTER — Inpatient Hospital Stay (HOSPITAL_COMMUNITY): Payer: Medicare PPO

## 2020-08-25 DIAGNOSIS — K5792 Diverticulitis of intestine, part unspecified, without perforation or abscess without bleeding: Secondary | ICD-10-CM | POA: Diagnosis not present

## 2020-08-25 DIAGNOSIS — N179 Acute kidney failure, unspecified: Secondary | ICD-10-CM | POA: Diagnosis not present

## 2020-08-25 LAB — CBC
HCT: 36.8 % (ref 36.0–46.0)
Hemoglobin: 12.8 g/dL (ref 12.0–15.0)
MCH: 32.7 pg (ref 26.0–34.0)
MCHC: 34.8 g/dL (ref 30.0–36.0)
MCV: 94.1 fL (ref 80.0–100.0)
Platelets: 323 10*3/uL (ref 150–400)
RBC: 3.91 MIL/uL (ref 3.87–5.11)
RDW: 12.3 % (ref 11.5–15.5)
WBC: 14.3 10*3/uL — ABNORMAL HIGH (ref 4.0–10.5)
nRBC: 0 % (ref 0.0–0.2)

## 2020-08-25 LAB — BASIC METABOLIC PANEL
Anion gap: 8 (ref 5–15)
Anion gap: 9 (ref 5–15)
BUN: 22 mg/dL (ref 8–23)
BUN: 20 mg/dL (ref 8–23)
CO2: 19 mmol/L — ABNORMAL LOW (ref 22–32)
CO2: 18 mmol/L — ABNORMAL LOW (ref 22–32)
Calcium: 7.9 mg/dL — ABNORMAL LOW (ref 8.9–10.3)
Calcium: 7.7 mg/dL — ABNORMAL LOW (ref 8.9–10.3)
Chloride: 102 mmol/L (ref 98–111)
Chloride: 98 mmol/L (ref 98–111)
Creatinine, Ser: 1.02 mg/dL — ABNORMAL HIGH (ref 0.44–1.00)
Creatinine, Ser: 0.97 mg/dL (ref 0.44–1.00)
GFR, Estimated: 60 mL/min — ABNORMAL LOW (ref >60)
GFR, Estimated: >60 mL/min (ref >60)
Glucose, Bld: 81 mg/dL (ref 70–99)
Glucose, Bld: 87 mg/dL (ref 70–99)
Potassium: 2.7 mmol/L — CL (ref 3.5–5.1)
Potassium: 3.6 mmol/L (ref 3.5–5.1)
Sodium: 129 mmol/L — ABNORMAL LOW (ref 135–145)
Sodium: 125 mmol/L — ABNORMAL LOW (ref 135–145)

## 2020-08-25 LAB — PHOSPHORUS: Phosphorus: 2.2 mg/dL — ABNORMAL LOW (ref 2.5–4.6)

## 2020-08-25 LAB — MAGNESIUM: Magnesium: 2.9 mg/dL — ABNORMAL HIGH (ref 1.7–2.4)

## 2020-08-25 MED ORDER — MELATONIN 3 MG PO TABS
3.0000 mg | ORAL_TABLET | Freq: Every evening | ORAL | Status: DC | PRN
  Administered 2020-08-25: 3 mg via ORAL
  Filled 2020-08-25: qty 1

## 2020-08-25 MED ORDER — MORPHINE SULFATE (PF) 2 MG/ML IV SOLN
2.0000 mg | INTRAVENOUS | Status: DC | PRN
  Administered 2020-08-26 – 2020-08-29 (×9): 2 mg via INTRAVENOUS
  Filled 2020-08-25 (×9): qty 1

## 2020-08-25 MED ORDER — POTASSIUM CHLORIDE CRYS ER 20 MEQ PO TBCR
40.0000 meq | EXTENDED_RELEASE_TABLET | Freq: Once | ORAL | Status: AC
  Administered 2020-08-25: 40 meq via ORAL
  Filled 2020-08-25: qty 2

## 2020-08-25 MED ORDER — SODIUM CHLORIDE 0.9 % IV SOLN
12.5000 mg | Freq: Four times a day (QID) | INTRAVENOUS | Status: DC | PRN
  Filled 2020-08-25: qty 0.5

## 2020-08-25 MED ORDER — MAGNESIUM SULFATE 4 GM/100ML IV SOLN
4.0000 g | Freq: Once | INTRAVENOUS | Status: AC
  Administered 2020-08-25: 4 g via INTRAVENOUS
  Filled 2020-08-25: qty 100

## 2020-08-25 MED ORDER — PROMETHAZINE HCL 25 MG/ML IJ SOLN
INTRAMUSCULAR | Status: AC
  Filled 2020-08-25: qty 1

## 2020-08-25 MED ORDER — PANTOPRAZOLE SODIUM 40 MG IV SOLR
40.0000 mg | INTRAVENOUS | Status: DC
  Administered 2020-08-25 – 2020-09-06 (×13): 40 mg via INTRAVENOUS
  Filled 2020-08-25 (×13): qty 40

## 2020-08-25 MED ORDER — POTASSIUM CHLORIDE 10 MEQ/100ML IV SOLN
10.0000 meq | INTRAVENOUS | Status: AC
Start: 2020-08-25 — End: 2020-08-25
  Administered 2020-08-25 (×2): 10 meq via INTRAVENOUS
  Filled 2020-08-25 (×2): qty 100

## 2020-08-25 MED ORDER — POTASSIUM CHLORIDE 10 MEQ/100ML IV SOLN
10.0000 meq | INTRAVENOUS | Status: AC
  Administered 2020-08-25 (×6): 10 meq via INTRAVENOUS
  Filled 2020-08-25 (×6): qty 100

## 2020-08-25 MED ORDER — IOHEXOL 350 MG/ML SOLN
80.0000 mL | Freq: Once | INTRAVENOUS | Status: AC | PRN
  Administered 2020-08-25: 85 mL via INTRAVENOUS

## 2020-08-25 MED ORDER — HYDROMORPHONE HCL 1 MG/ML IJ SOLN
0.5000 mg | INTRAMUSCULAR | Status: DC | PRN
  Administered 2020-08-25: 1 mg via INTRAVENOUS
  Filled 2020-08-25: qty 1

## 2020-08-25 MED ORDER — SODIUM CHLORIDE 0.9 % IV SOLN: INTRAVENOUS | Status: DC

## 2020-08-25 NOTE — Progress Notes (Signed)
PROGRESS NOTE    Anne Aguirre  NOM:[id number] DOB: 1950/09/21 DOA: 08/23/2020 PCP: Pcp, No    Brief Narrative:  70 y.o. female with medical history significant for hypertension, now presenting to the emergency department for evaluation of abdominal pain, nausea, and vomiting.  Patient reports that she developed some cramping lower abdominal discomfort approximately 2 weeks ago which has been more frequent and intense over the past several days and associated with nausea and nonbloody vomiting.  She denies any melena or hematochezia but has not moved her bowels in 6 days.  She has been able to tolerate some sips of liquids but vomits shortly after any attempted eating. Pt was found to be septic with diverticulitis  Assessment & Plan:   Principal Problem:   Acute diverticulitis Active Problems:   Essential hypertension, benign   Acute renal failure superimposed on stage 3a chronic kidney disease (HCC)   Hyponatremia   Hyperbilirubinemia   Hypercalcemia  1. Acute sigmoid diverticulitis  - Pt presents with cramping lower abdominal pain and has CT findings suggestive of acute sigmoid diverticulitis without evidence for perforation or abscess  - Pt is continued on IVF hydration -Continue empiric flagyl and cefepime -Not tolerating clears this AM, complaining of increased abd distension -Decreased BS on exam -Have ordered f/u CT abd/pelvis   2. Acute kidney injury superimposed on CKD IIIa  - SCr is 2.04 on admission, up from baseline of ~1  - No hydronephrosis on CT in ED  - Likely acute prerenal azotemia in setting of anorexia and N/V  - hold ARB, renally -continue IVF hydration -Cr is improving   3. Hyponatremia  - Serum sodium is 124 on admission in setting of hypovolemia  - Initially normalized with IVF hydration - sodium is down to 129 this AM. Resume IVF   4. Hypercalcemia  - Serum calcium is 11.4 on admission  - Continued on IVF hydration   5. Hyperbilirubinemia  - Total  bilirubin is 2.0 on admission, previously normal   - Likely liver cysts noted on CT, no other hepatobiliary abnormality  - bili had been improving   6. Hypertension  - BP low in ED and antihypertensives will be held initially    DVT prophylaxis: Heparin subq Code Status: Full Family Communication: Pt in room, family not at bedside  Status is: Inpatient  Remains inpatient appropriate because:Inpatient level of care appropriate due to severity of illness  Dispo: The patient is from: Home              Anticipated d/c is to: Home              Patient currently is not medically stable to d/c.   Difficult to place patient No   Consultants:    Procedures:    Antimicrobials: Anti-infectives (From admission, onward)    Start     Dose/Rate Route Frequency Ordered Stop   08/24/20 1145  ceFEPIme (MAXIPIME) 2 g in sodium chloride 0.9 % 100 mL IVPB        2 g 200 mL/hr over 30 Minutes Intravenous Every 12 hours 08/24/20 1051     08/23/20 2100  ceFEPIme (MAXIPIME) 2 g in sodium chloride 0.9 % 100 mL IVPB  Status:  Discontinued        2 g 200 mL/hr over 30 Minutes Intravenous Every 24 hours 08/23/20 2051 08/24/20 1051   08/23/20 1700  ciprofloxacin (CIPRO) IVPB 400 mg  Status:  Discontinued        400 mg  200 mL/hr over 60 Minutes Intravenous Every 12 hours 08/23/20 1645 08/23/20 2041   08/23/20 1700  metroNIDAZOLE (FLAGYL) IVPB 500 mg        500 mg 100 mL/hr over 60 Minutes Intravenous Every 8 hours 08/23/20 1645         Subjective: Complaining of increased abd distension and sob  Objective: Vitals:   08/25/20 1300 08/25/20 1305 08/25/20 1400 08/25/20 1500  BP: 140/77  123/74 (!) 154/87  Pulse: 89 86  94  Resp: (!) 31 18 (!) 26 (!) 27  Temp:      TempSrc:      SpO2: (!) 89% 93%  95%  Weight:      Height:        Intake/Output Summary (Last 24 hours) at 08/25/2020 1554 Last data filed at 08/25/2020 1532 Gross per 24 hour  Intake 1612.85 ml  Output 2400 ml  Net -787.15  ml    Filed Weights   08/23/20 1314 08/24/20 0400 08/25/20 0500  Weight: 56.2 kg 61.4 kg 61 kg    Examination: General exam: Conversant, in no acute distress Respiratory system: normal chest rise, clear, no audible wheezing Cardiovascular system: regular rhythm, s1-s2 Gastrointestinal system: distended, decreased BS Central nervous system: No seizures, no tremors Extremities: No cyanosis, no joint deformities Skin: No rashes, no pallor Psychiatry: Affect normal // no auditory hallucinations   Data Reviewed: I have personally reviewed following labs and imaging studies  CBC: Recent Labs  Lab 08/23/20 1341 08/24/20 0405 08/25/20 0400  WBC 19.1* 9.7 14.3*  HGB 15.0 12.6 12.8  HCT 42.6 37.7 36.8  MCV 94.2 97.9 94.1  PLT 443* 341 323    Basic Metabolic Panel: Recent Labs  Lab 08/23/20 1341 08/24/20 0405 08/25/20 0400  NA 124* 135 129*  K 3.7 3.2* 2.7*  CL 88* 106 102  CO2 24 21* 19*  GLUCOSE 109* 85 81  BUN 40* 30* 22  CREATININE 2.04* 1.42* 1.02*  CALCIUM 11.4* 8.7* 7.9*  MG  --  1.4*  --   PHOS  --  <1.0* 2.2*    GFR: Estimated Creatinine Clearance: 49.4 mL/min (A) (by C-G formula based on SCr of 1.02 mg/dL (H)). Liver Function Tests: Recent Labs  Lab 08/23/20 1341 08/24/20 0405  AST 15 18  ALT 12 11  ALKPHOS 62 47  BILITOT 2.0* 1.3*  PROT 7.5 5.8*  ALBUMIN 3.4* 2.6*    Recent Labs  Lab 08/23/20 1341  LIPASE 20    No results for input(s): AMMONIA in the last 168 hours. Coagulation Profile: No results for input(s): INR, PROTIME in the last 168 hours. Cardiac Enzymes: No results for input(s): CKTOTAL, CKMB, CKMBINDEX, TROPONINI in the last 168 hours. BNP (last 3 results) No results for input(s): PROBNP in the last 8760 hours. HbA1C: No results for input(s): HGBA1C in the last 72 hours. CBG: No results for input(s): GLUCAP in the last 168 hours. Lipid Profile: No results for input(s): CHOL, HDL, LDLCALC, TRIG, CHOLHDL, LDLDIRECT in the  last 72 hours. Thyroid Function Tests: No results for input(s): TSH, T4TOTAL, FREET4, T3FREE, THYROIDAB in the last 72 hours. Anemia Panel: No results for input(s): VITAMINB12, FOLATE, FERRITIN, TIBC, IRON, RETICCTPCT in the last 72 hours. Sepsis Labs: Recent Labs  Lab 08/23/20 2007 08/23/20 2215 08/24/20 0405  LATICACIDVEN 2.0* 2.0* 1.4     Recent Results (from the past 240 hour(s))  Resp Panel by RT-PCR (Flu A&B, Covid) Nasopharyngeal Swab     Status: None   Collection  Time: 08/23/20  3:35 PM   Specimen: Nasopharyngeal Swab; Nasopharyngeal(NP) swabs in vial transport medium  Result Value Ref Range Status   SARS Coronavirus 2 by RT PCR NEGATIVE NEGATIVE Final    Comment: (NOTE) SARS-CoV-2 target nucleic acids are NOT DETECTED.  The SARS-CoV-2 RNA is generally detectable in upper respiratory specimens during the acute phase of infection. The lowest concentration of SARS-CoV-2 viral copies this assay can detect is 138 copies/mL. A negative result does not preclude SARS-Cov-2 infection and should not be used as the sole basis for treatment or other patient management decisions. A negative result may occur with  improper specimen collection/handling, submission of specimen other than nasopharyngeal swab, presence of viral mutation(s) within the areas targeted by this assay, and inadequate number of viral copies(<138 copies/mL). A negative result must be combined with clinical observations, patient history, and epidemiological information. The expected result is Negative.  Fact Sheet for Patients:  BloggerCourse.com  Fact Sheet for Healthcare Providers:  SeriousBroker.it  This test is no t yet approved or cleared by the [place] FDA and  has been authorized for detection and/or diagnosis of SARS-CoV-2 by FDA under an Emergency Use Authorization (EUA). This EUA will remain  in effect (meaning this test can be used) for the  duration of the COVID-19 declaration under Section 564(b)(1) of the Act, 21 U.S.C.section 360bbb-3(b)(1), unless the authorization is terminated  or revoked sooner.       Influenza A by PCR NEGATIVE NEGATIVE Final   Influenza B by PCR NEGATIVE NEGATIVE Final    Comment: (NOTE) The Xpert Xpress SARS-CoV-2/FLU/RSV plus assay is intended as an aid in the diagnosis of influenza from Nasopharyngeal swab specimens and should not be used as a sole basis for treatment. Nasal washings and aspirates are unacceptable for Xpert Xpress SARS-CoV-2/FLU/RSV testing.  Fact Sheet for Patients: BloggerCourse.com  Fact Sheet for Healthcare Providers: SeriousBroker.it  This test is not yet approved or cleared by the [place] FDA and has been authorized for detection and/or diagnosis of SARS-CoV-2 by FDA under an Emergency Use Authorization (EUA). This EUA will remain in effect (meaning this test can be used) for the duration of the COVID-19 declaration under Section 564(b)(1) of the Act, 21 U.S.C. section 360bbb-3(b)(1), unless the authorization is terminated or revoked.  Performed at [facility], [address] [place], [place] [address]   Culture, blood (routine x 2)     Status: None (Preliminary result)   Collection Time: 08/23/20  8:07 PM   Specimen: BLOOD RIGHT ARM  Result Value Ref Range Status   Specimen Description BLOOD RIGHT ARM  Final   Special Requests   Final    Blood Culture results may not be optimal due to an inadequate volume of blood received in culture bottles BOTTLES DRAWN AEROBIC AND ANAEROBIC   Culture   Final    NO GROWTH 2 DAYS Performed at [facility], [address], [place], [place] [address]    Report Status PENDING  Incomplete  MRSA Next Gen by PCR, Nasal     Status: None   Collection Time: 08/24/20  3:43 AM   Specimen: Nasal Mucosa; Nasal Swab  Result Value Ref Range Status   MRSA by PCR Next Gen NOT  DETECTED NOT DETECTED Final    Comment: (NOTE) The GeneXpert MRSA Assay (FDA approved for NASAL specimens only), is one component of a comprehensive MRSA colonization surveillance program. It is not intended to diagnose MRSA infection nor to guide or monitor treatment for  MRSA infections. Test performance is not FDA approved in patients less than 79 years old. Performed at [facility], [address], [place], [place] [address]       Radiology Studies: CT ABDOMEN PELVIS WO CONTRAST  Result Date: 08/23/2020 CLINICAL DATA:  Lower abdominal pain EXAM: CT ABDOMEN AND PELVIS WITHOUT CONTRAST TECHNIQUE: Multidetector CT imaging of the abdomen and pelvis was performed following the standard protocol without IV contrast. COMPARISON:  None. FINDINGS: Lower chest: Lung bases demonstrate no acute consolidation or effusion. Partially visualized breast prostheses. Mild circumferential thickening of the distal esophagus. Hepatobiliary: Hypodense liver lesions likely cysts. Additional subcentimeter hypodensities too small to further characterize. No calcified gallstone or biliary dilatation Pancreas: Unremarkable. No pancreatic ductal dilatation or surrounding inflammatory changes. Spleen: Normal in size without focal abnormality. Adrenals/Urinary Tract: Adrenal glands are normal. Kidneys show no hydronephrosis. The bladder is slightly thick walled Stomach/Bowel: The stomach is nonenlarged. No dilated small bowel. Fluid distension of the colon. Mild wall thickening with inflammatory change at the sigmoid colon with diverticula present. Vascular/Lymphatic: Mild to moderate aortic atherosclerosis. No aneurysm. No suspicious nodes. Reproductive: Uterus and bilateral adnexa are unremarkable. Other: No free air.  Small fluid in the pelvis Musculoskeletal: No acute osseous abnormality. Degenerative changes of the spine. IMPRESSION: 1. Mild wall thickening with associated inflammatory change at sigmoid colon with  diverticula present, likely reflecting an acute diverticulitis. No definite perforation or abscess. 2. Moderate fluid distension of the colon upstream to the area of sigmoid colon inflammation. No evidence for small bowel obstruction. 3. Mild circumferential distal esophageal thickening questionable for reflux or esophagitis. Electronically Signed   By: Jasmine Pang M.D.   On: 08/23/2020 16:34   DG Chest 2 View  Result Date: 08/23/2020 CLINICAL DATA:  Nausea vomiting EXAM: CHEST - 2 VIEW COMPARISON:  None. FINDINGS: The heart size and mediastinal contours are within normal limits. Both lungs are clear. The visualized skeletal structures are unremarkable. IMPRESSION: No active cardiopulmonary disease. Electronically Signed   By: Jasmine Pang M.D.   On: 08/23/2020 16:34    Scheduled Meds:  Chlorhexidine Gluconate Cloth  6 each Topical Daily   feeding supplement  1 Container Oral TID BM   heparin  5,000 Units Subcutaneous Q8H   pantoprazole  40 mg Oral Daily   Continuous Infusions:  ceFEPime (MAXIPIME) IV Stopped (08/25/20 1025)   metronidazole Stopped (08/25/20 0910)     LOS: 2 days   Rickey Barbara, MD [facility] Pager On Amion  If 7PM-7AM, please contact night-coverage 08/25/2020, 3:54 PM

## 2020-08-25 NOTE — Plan of Care (Signed)

## 2020-08-25 NOTE — Treatment Plan (Signed)
CT results noted. Case discussed with General Surgery who will see in consultation. Recommendation to discuss with Radiology. Case discussed with Radiology over phone for consideration for drain placement. IR to review images in AM to decide if drain placement is warranted. Will continue on IV cefepime with flagyl. Keep strict NPO. Have d/c subq heparin in anticipation for possible drain placement. Updated pt, all questions answered.

## 2020-08-25 NOTE — Care Management Important Message (Signed)
Important Message  Patient Details  Name: Anne Aguirre MRN: [id number] Date of Birth: Apr 09, 1950   Medicare Important Message Given:  Yes     Corey Harold 08/25/2020, 12:19 PM

## 2020-08-25 NOTE — Progress Notes (Signed)
Family and family friend, prior attending, asking about treatment plan and GI consultation. Dr. Rhona Leavens made aware. Orders received for repeat abdominal CT at 1030. Pt finished drinking contrast dye for CT study at 12:15. CT unable to see pt until 14:42. CT results finalized at 1712. Dr. Rhona Leavens made aware of results.

## 2020-08-26 ENCOUNTER — Encounter (HOSPITAL_COMMUNITY): Payer: Self-pay | Admitting: Family Medicine

## 2020-08-26 DIAGNOSIS — N179 Acute kidney failure, unspecified: Secondary | ICD-10-CM | POA: Diagnosis not present

## 2020-08-26 DIAGNOSIS — E860 Dehydration: Secondary | ICD-10-CM

## 2020-08-26 DIAGNOSIS — K5792 Diverticulitis of intestine, part unspecified, without perforation or abscess without bleeding: Secondary | ICD-10-CM | POA: Diagnosis not present

## 2020-08-26 LAB — BASIC METABOLIC PANEL
Anion gap: 9 (ref 5–15)
BUN: 18 mg/dL (ref 8–23)
CO2: 15 mmol/L — ABNORMAL LOW (ref 22–32)
Calcium: 7.3 mg/dL — ABNORMAL LOW (ref 8.9–10.3)
Chloride: 103 mmol/L (ref 98–111)
Creatinine, Ser: 0.91 mg/dL (ref 0.44–1.00)
GFR, Estimated: >60 mL/min (ref >60)
Glucose, Bld: 83 mg/dL (ref 70–99)
Potassium: 3.8 mmol/L (ref 3.5–5.1)
Sodium: 127 mmol/L — ABNORMAL LOW (ref 135–145)

## 2020-08-26 LAB — CBC
HCT: 40.1 % (ref 36.0–46.0)
Hemoglobin: 13.7 g/dL (ref 12.0–15.0)
MCH: 32.5 pg (ref 26.0–34.0)
MCHC: 34.2 g/dL (ref 30.0–36.0)
MCV: 95.2 fL (ref 80.0–100.0)
Platelets: 416 10*3/uL — ABNORMAL HIGH (ref 150–400)
RBC: 4.21 MIL/uL (ref 3.87–5.11)
RDW: 12.5 % (ref 11.5–15.5)
WBC: 25 10*3/uL — ABNORMAL HIGH (ref 4.0–10.5)
nRBC: 0 % (ref 0.0–0.2)

## 2020-08-26 LAB — LACTIC ACID, PLASMA
Lactic Acid, Venous: 1.2 mmol/L (ref 0.5–1.9)
Lactic Acid, Venous: 1.4 mmol/L (ref 0.5–1.9)

## 2020-08-26 MED ORDER — LORAZEPAM 2 MG/ML IJ SOLN
0.5000 mg | Freq: Two times a day (BID) | INTRAMUSCULAR | Status: DC | PRN
Start: 2020-08-26 — End: 2020-08-26
  Administered 2020-08-26: 0.5 mg via INTRAVENOUS
  Filled 2020-08-26 (×2): qty 1

## 2020-08-26 MED ORDER — LORAZEPAM 2 MG/ML IJ SOLN
0.5000 mg | INTRAMUSCULAR | Status: DC | PRN
  Administered 2020-08-26 – 2020-09-04 (×8): 0.5 mg via INTRAVENOUS
  Filled 2020-08-26 (×9): qty 1

## 2020-08-26 NOTE — Progress Notes (Signed)
Report given to Nurse Tresa Endo on progressive care unit at [facility].

## 2020-08-26 NOTE — Progress Notes (Signed)
Patient has been nauseous and vomiting after Dilaudid 1mg  received. Zofran was given but it seems not last long. patient feels restless and she vomited again about of emesis around 2200. She denied any pain at this moment. vitals are stable. She looks clammy and pale. she said she never had this medication before. She thinks Diladid made her feel weird and nausea/vomiting.  She said she feels better after vomit. Dr. notified. Dilaudid is d/c'd and morphine is ordered instead. Phenergan 12.5mg  IVPB PRN is ordered but patient said she will ask it later. Will continue to assess.

## 2020-08-26 NOTE — Progress Notes (Signed)
[facility] at bedside to prepare patient for transport to [facility].

## 2020-08-26 NOTE — Consult Note (Signed)
Reason for Consult:diverticulitis with abscess   Referring Physician: Dr Hanley Ben is an 70 y.o. female.  HPI: 23 yof who has not had prior csc, no prior diverticulitis admitted to outside hospital 8/10 after experiencing about 2 weeks of lower abdominal pain.  She was found to have sigmoid diverticulitis. She was placed on abx. She has not had flatus or bm in several days.  She was noted to have more pain and elevated wbc today.  She underwent repeat ct scan that shows a 5.8x4.3 cm right pelvic collection with some air.  She also has small bowel dilation. She was then transferred here for higher level of care and IR drainage.  She has low grade tachycardia when I see her.   Past Medical History:  Diagnosis Date   Essential hypertension, benign 10/01/2018   HLD (hyperlipidemia) 10/01/2018   Osteoarthritis 10/01/2018   Vitamin D deficiency disease 10/01/2018    Past Surgical History:  Procedure Laterality Date   TONSILLECTOMY Bilateral     Family History  Problem Relation Age of Onset   Hypertension Mother    Stroke Mother    Stroke Father    Hypertension Brother    Hypertension Son     Social History:  reports that she has never smoked. She has never used smokeless tobacco. She reports current alcohol use of about 7.0 standard drinks per week. She reports that she does not use drugs.  Allergies: No Known Allergies  Medications: I have reviewed the patient's current medications.  Results for orders placed or performed during the hospital encounter of 08/23/20 (from the past 48 hour(s))  Basic metabolic panel     Status: Abnormal   Collection Time: 08/25/20  4:00 AM  Result Value Ref Range   Sodium 129 (L) 135 - 145 mmol/L   Potassium 2.7 (LL) 3.5 - 5.1 mmol/L    Comment: CRITICAL RESULT CALLED TO, READ BACK BY AND VERIFIED WITH: FINSKY,P AT 5:40AM ON 08/25/20 BY FESTERMAN,C    Chloride 102 98 - 111 mmol/L   CO2 19 (L) 22 - 32 mmol/L   Glucose, Bld 81 70 - 99 mg/dL     Comment: Glucose reference range applies only to samples taken after fasting for at least 8 hours.   BUN 22 8 - 23 mg/dL   Creatinine, Ser 5.92 (H) 0.44 - 1.00 mg/dL   Calcium 7.9 (L) 8.9 - 10.3 mg/dL   GFR, Estimated 60 (L) >60 mL/min    Comment: (NOTE) Calculated using the CKD-EPI Creatinine Equation (2021)    Anion gap 8 5 - 15    Comment: Performed at [facility], [address], [place], [place] [address]  CBC     Status: Abnormal   Collection Time: 08/25/20  4:00 AM  Result Value Ref Range   WBC 14.3 (H) 4.0 - 10.5 K/uL   RBC 3.91 3.87 - 5.11 MIL/uL   Hemoglobin 12.8 12.0 - 15.0 g/dL   HCT 28.6 38.1 - 77.1 %   MCV 94.1 80.0 - 100.0 fL   MCH 32.7 26.0 - 34.0 pg   MCHC 34.8 30.0 - 36.0 g/dL   RDW 16.5 79.0 - 38.3 %   Platelets 323 150 - 400 K/uL   nRBC 0.0 0.0 - 0.2 %    Comment: Performed at [facility], [address], [place], [place] [address]  Phosphorus     Status: Abnormal   Collection Time: 08/25/20  4:00 AM  Result Value Ref Range  Phosphorus 2.2 (L) 2.5 - 4.6 mg/dL    Comment: Performed at [facility], [address], [place], [place] [address]  Magnesium     Status: Abnormal   Collection Time: 08/25/20  1:48 PM  Result Value Ref Range   Magnesium 2.9 (H) 1.7 - 2.4 mg/dL    Comment: Performed at [facility], [address] [place], [place] [address]  Basic metabolic panel     Status: Abnormal   Collection Time: 08/25/20  1:48 PM  Result Value Ref Range   Sodium 125 (L) 135 - 145 mmol/L   Potassium 3.6 3.5 - 5.1 mmol/L    Comment: DELTA CHECK NOTED   Chloride 98 98 - 111 mmol/L   CO2 18 (L) 22 - 32 mmol/L   Glucose, Bld 87 70 - 99 mg/dL    Comment: Glucose reference range applies only to samples taken after fasting for at least 8 hours.   BUN 20 8 - 23 mg/dL   Creatinine, Ser 8.18 0.44 - 1.00 mg/dL   Calcium 7.7 (L) 8.9 - 10.3 mg/dL   GFR, Estimated >56 >31 mL/min    Comment: (NOTE) Calculated using the CKD-EPI Creatinine Equation (2021)    Anion gap  9 5 - 15    Comment: Performed at [facility], [address], [place], [place] [address]  Basic metabolic panel     Status: Abnormal   Collection Time: 08/26/20  4:05 AM  Result Value Ref Range   Sodium 127 (L) 135 - 145 mmol/L   Potassium 3.8 3.5 - 5.1 mmol/L   Chloride 103 98 - 111 mmol/L   CO2 15 (L) 22 - 32 mmol/L   Glucose, Bld 83 70 - 99 mg/dL    Comment: Glucose reference range applies only to samples taken after fasting for at least 8 hours.   BUN 18 8 - 23 mg/dL   Creatinine, Ser 6.37 0.44 - 1.00 mg/dL   Calcium 7.3 (L) 8.9 - 10.3 mg/dL   GFR, Estimated >85 >88 mL/min    Comment: (NOTE) Calculated using the CKD-EPI Creatinine Equation (2021)    Anion gap 9 5 - 15    Comment: Performed at [facility], [address], [place], [place] [address]  CBC     Status: Abnormal   Collection Time: 08/26/20  4:05 AM  Result Value Ref Range   WBC 25.0 (H) 4.0 - 10.5 K/uL   RBC 4.21 3.87 - 5.11 MIL/uL   Hemoglobin 13.7 12.0 - 15.0 g/dL   HCT 41.2 87.8 - 67.6 %   MCV 95.2 80.0 - 100.0 fL   MCH 32.5 26.0 - 34.0 pg   MCHC 34.2 30.0 - 36.0 g/dL   RDW 72.0 94.7 - 09.6 %   Platelets 416 (H) 150 - 400 K/uL   nRBC 0.0 0.0 - 0.2 %    Comment: Performed at [facility], [address] [place], [place] [address]  Lactic acid, plasma     Status: None   Collection Time: 08/26/20  9:35 AM  Result Value Ref Range   Lactic Acid, Venous 1.2 0.5 - 1.9 mmol/L    Comment: Performed at [facility], [address], [place], [place] [address]  Lactic acid, plasma     Status: None   Collection Time: 08/26/20 12:26 PM  Result Value Ref Range   Lactic Acid, Venous 1.4 0.5 - 1.9 mmol/L    Comment: Performed at [facility], [address], [place], [place] [address]    CT  ABDOMEN PELVIS W CONTRAST  Result Date: 08/25/2020 CLINICAL DATA:  Abdominal distension and elevated white blood cell count EXAM: CT ABDOMEN AND PELVIS WITH CONTRAST TECHNIQUE: Multidetector CT imaging of the abdomen and pelvis was  performed using the standard protocol following bolus administration of intravenous contrast. CONTRAST:  85mL OMNIPAQUE IOHEXOL 350 MG/ML SOLN COMPARISON:  08/23/2020 FINDINGS: Lower chest: Lung bases demonstrate bibasilar consolidation with small effusions new from the prior exam. Bilateral breast implants are noted. Hepatobiliary: Scattered hypodensities are again seen throughout the liver consistent with cysts. Gallbladder demonstrates some minimal dependent density likely related to sludge. No definitive calcified stones are noted. No biliary ductal dilatation is seen. Pancreas: Unremarkable. No pancreatic ductal dilatation or surrounding inflammatory changes. Spleen: Normal in size without focal abnormality. Adrenals/Urinary Tract: Adrenal glands are within normal limits. Kidneys demonstrate a normal enhancement pattern bilaterally. Normal excretion of contrast is seen. A few small cysts are noted. Ureters are within normal limits. Bladder is partially distended. Stomach/Bowel: Colons demonstrates scattered diverticular change is well as some inflammatory change in the region of the sigmoid these changes are similar to that noted on the prior exam. Stomach is well distended with contrast material. The small bowel is prominent with multiple fluid-filled dilated loops of small bowel identified. Some peristalsis is noted within the small bowel although it appears to be dilated to the level of mid ileum without discrete transition zone. Decreased bowel sounds are noted on physical exam in these changes may represent a generalized small bowel ileus. Vascular/Lymphatic: Aortic atherosclerosis. No enlarged abdominal or pelvic lymph nodes. Reproductive: Uterus and bilateral adnexa are unremarkable. Other: There is a new air-fluid collection identified in the right hemipelvis best seen on image number 74 of series 2 this is new from the prior exam and demonstrates a small amount of air within. This measures 5.8 x 4.3 cm  in greatest dimension. Given the inflammatory changes and diverticulitis this likely represents a developing abscess from diverticular rupture. No other focal fluid collection is noted. Musculoskeletal: Degenerative changes of the lumbar spine are noted. IMPRESSION: New air-fluid collection in the right hemipelvis likely representing a developing abscess. This is most likely related to the underlying diverticulitis and possible diverticular rupture. Generalized small bowel dilatation with fluid-filled loops of small bowel. On clinical exam there are decreased bowel sounds in these changes may represent a reactive small bowel ileus. Continued follow-up is recommended. New bilateral lower lobe consolidation with associated effusions consistent with atelectasis/early infiltrate. Electronically Signed   By: Alcide CleverMark  Lukens M.D.   On: 08/25/2020 17:12    Review of Systems  Constitutional:  Positive for fatigue.  Gastrointestinal:  Positive for abdominal pain, nausea and vomiting.  Genitourinary:  Positive for difficulty urinating.  All other systems reviewed and are negative. Blood pressure 123/74, pulse 94, temperature 98.5 F (36.9 C), temperature source Oral, resp. rate (!) 26, height 5\' 6"  (1.676 m), weight 61 kg, SpO2 99 %. Physical Exam Constitutional:      Appearance: She is well-developed.  Eyes:     General: No scleral icterus. Cardiovascular:     Rate and Rhythm: Regular rhythm. Tachycardia present.  Pulmonary:     Effort: Pulmonary effort is normal.  Abdominal:     General: There is distension (mild).     Tenderness: There is abdominal tenderness in the right lower quadrant, suprapubic area and left lower quadrant.     Hernia: No hernia is present.  Skin:    General: Skin is warm and dry.  Capillary Refill: Capillary refill takes less than 2 seconds.  Neurological:     General: No focal deficit present.     Mental Status: She is alert.  Psychiatric:        Mood and Affect: Mood  normal.    Assessment/Plan: Diverticulitis with abscess -IR drainage tomorrow, hopefully ileus/sbo will resolve with that, would leave npo for now.  Discussed indications for surgery including worsening or failure to improve with medical mgt -continue abx -lovenox fine after drain from my standpoint -check prealbumin -if she gets through this episode she will need csc in 6-8 weeks -will follow Emelia Loron 08/26/2020, 5:19 PM

## 2020-08-26 NOTE — Progress Notes (Signed)
PROGRESS NOTE    Anne Aguirre  QJJ:[id number] DOB: 1950-09-01 DOA: 08/23/2020 PCP: Pcp, No    Brief Narrative:  70 y.o. female with medical history significant for hypertension, now presenting to the emergency department for evaluation of abdominal pain, nausea, and vomiting.  Patient reports that she developed some cramping lower abdominal discomfort approximately 2 weeks ago which has been more frequent and intense over the past several days and associated with nausea and nonbloody vomiting.  She denies any melena or hematochezia but has not moved her bowels in 6 days.  She has been able to tolerate some sips of liquids but vomits shortly after any attempted eating. Pt was found to be septic with diverticulitis  Assessment & Plan:   Principal Problem:   Acute diverticulitis Active Problems:   Essential hypertension, benign   Acute renal failure superimposed on stage 3a chronic kidney disease (HCC)   Hyponatremia   Hyperbilirubinemia   Hypercalcemia  1. Acute sigmoid diverticulitis, now with perforation and developing abscess with sepsis present on admission - Pt presented initially with cramping lower abdominal pain and has CT findings suggestive of acute sigmoid diverticulitis without evidence for perforation or abscess  -Continued on flagyl and cefepime -Pt was not tolerating clears and had increased distension and abd discomfort, prompting repeat CT abd on 8/12. Findings reviewed with Radiology of diverticular perforation and developing abscess -General Surgery is now following, recommending IR eval for drain placement -Discussed with IR with recs to transfer to [facility] for eval for drain placement -WBC is worsening, up to 25k today, lactate <2 -Blood cx neg thus far -Would continue above abx -Repeat CBC in AM   2. Acute kidney injury superimposed on CKD IIIa  - SCr is 2.04 on admission, up from baseline of ~1  - No hydronephrosis on CT in ED  - Likely acute prerenal azotemia in  setting of anorexia and N/V  - hold ARB -Renal function normalized with IVF hydration   3. Hyponatremia  - Serum sodium is 124 on admission in setting of hypovolemia  - Initially normalized with IVF hydration - sodium 127 today. Continue with IVF hydration -recheck bmet in AM   4. Hypercalcemia  - Serum calcium is 11.4 on admission  - Continued on IVF hydration -Ca of 7.3 today   5. Hyperbilirubinemia  - Total bilirubin is 2.0 on admission, previously normal   - Likely liver cysts noted on CT, no other hepatobiliary abnormality  - bili had been improving -recheck cmp in AM   6. Hypertension  - BP low in ED and antihypertensives continue to be on hold   DVT prophylaxis: SCD's Code Status: Full Family Communication: Pt in room, family is currently at bedside  Status is: Inpatient  Remains inpatient appropriate because:Inpatient level of care appropriate due to severity of illness  Dispo: The patient is from: Home              Anticipated d/c is to: Home              Patient currently is not medically stable to d/c.   Difficult to place patient No   Consultants:  General Surgery IR  Procedures:    Antimicrobials: Anti-infectives (From admission, onward)    Start     Dose/Rate Route Frequency Ordered Stop   08/24/20 1145  ceFEPIme (MAXIPIME) 2 g in sodium chloride 0.9 % 100 mL IVPB        2 g 200 mL/hr over 30 Minutes Intravenous Every 12  hours 08/24/20 1051     08/23/20 2100  ceFEPIme (MAXIPIME) 2 g in sodium chloride 0.9 % 100 mL IVPB  Status:  Discontinued        2 g 200 mL/hr over 30 Minutes Intravenous Every 24 hours 08/23/20 2051 08/24/20 1051   08/23/20 1700  ciprofloxacin (CIPRO) IVPB 400 mg  Status:  Discontinued        400 mg 200 mL/hr over 60 Minutes Intravenous Every 12 hours 08/23/20 1645 08/23/20 2041   08/23/20 1700  metroNIDAZOLE (FLAGYL) IVPB 500 mg        500 mg 100 mL/hr over 60 Minutes Intravenous Every 8 hours 08/23/20 1645          Subjective: Continues to have abd distension. Feeling anxious about transfer today  Objective: Vitals:   08/26/20 1027 08/26/20 1100 08/26/20 1114 08/26/20 1200  BP:  104/63  103/67  Pulse: 73 80 84 78  Resp: (!) 29 (!) 31 (!) 27 (!) 24  Temp:   98.2 F (36.8 C)   TempSrc:   Oral   SpO2: 98% 100% 96% 97%  Weight:      Height:        Intake/Output Summary (Last 24 hours) at 08/26/2020 1558 Last data filed at 08/26/2020 0700 Gross per 24 hour  Intake 1497.52 ml  Output 1100 ml  Net 397.52 ml    Filed Weights   08/23/20 1314 08/24/20 0400 08/25/20 0500  Weight: 56.2 kg 61.4 kg 61 kg    Examination: General exam: Conversant, in no acute distress Respiratory system: normal chest rise, clear, no audible wheezing Cardiovascular system: regular rhythm, s1-s2 Gastrointestinal system: Distended, generally tender, decreased BS Central nervous system: No seizures, no tremors Extremities: No cyanosis, no joint deformities Skin: No rashes, no pallor Psychiatry: Affect normal // no auditory hallucinations   Data Reviewed: I have personally reviewed following labs and imaging studies  CBC: Recent Labs  Lab 08/23/20 1341 08/24/20 0405 08/25/20 0400 08/26/20 0405  WBC 19.1* 9.7 14.3* 25.0*  HGB 15.0 12.6 12.8 13.7  HCT 42.6 37.7 36.8 40.1  MCV 94.2 97.9 94.1 95.2  PLT 443* 341 323 416*    Basic Metabolic Panel: Recent Labs  Lab 08/23/20 1341 08/24/20 0405 08/25/20 0400 08/25/20 1348 08/26/20 0405  NA 124* 135 129* 125* 127*  K 3.7 3.2* 2.7* 3.6 3.8  CL 88* 106 102 98 103  CO2 24 21* 19* 18* 15*  GLUCOSE 109* 85 81 87 83  BUN 40* 30* 22 20 18   CREATININE 2.04* 1.42* 1.02* 0.97 0.91  CALCIUM 11.4* 8.7* 7.9* 7.7* 7.3*  MG  --  1.4*  --  2.9*  --   PHOS  --  <1.0* 2.2*  --   --     GFR: Estimated Creatinine Clearance: 55.4 mL/min (by C-G formula based on SCr of 0.91 mg/dL). Liver Function Tests: Recent Labs  Lab 08/23/20 1341 08/24/20 0405  AST 15 18   ALT 12 11  ALKPHOS 62 47  BILITOT 2.0* 1.3*  PROT 7.5 5.8*  ALBUMIN 3.4* 2.6*    Recent Labs  Lab 08/23/20 1341  LIPASE 20    No results for input(s): AMMONIA in the last 168 hours. Coagulation Profile: No results for input(s): INR, PROTIME in the last 168 hours. Cardiac Enzymes: No results for input(s): CKTOTAL, CKMB, CKMBINDEX, TROPONINI in the last 168 hours. BNP (last 3 results) No results for input(s): PROBNP in the last 8760 hours. HbA1C: No results for input(s): HGBA1C in  the last 72 hours. CBG: No results for input(s): GLUCAP in the last 168 hours. Lipid Profile: No results for input(s): CHOL, HDL, LDLCALC, TRIG, CHOLHDL, LDLDIRECT in the last 72 hours. Thyroid Function Tests: No results for input(s): TSH, T4TOTAL, FREET4, T3FREE, THYROIDAB in the last 72 hours. Anemia Panel: No results for input(s): VITAMINB12, FOLATE, FERRITIN, TIBC, IRON, RETICCTPCT in the last 72 hours. Sepsis Labs: Recent Labs  Lab 08/23/20 2215 08/24/20 0405 08/26/20 0935 08/26/20 1226  LATICACIDVEN 2.0* 1.4 1.2 1.4     Recent Results (from the past 240 hour(s))  Resp Panel by RT-PCR (Flu A&B, Covid) Nasopharyngeal Swab     Status: None   Collection Time: 08/23/20  3:35 PM   Specimen: Nasopharyngeal Swab; Nasopharyngeal(NP) swabs in vial transport medium  Result Value Ref Range Status   SARS Coronavirus 2 by RT PCR NEGATIVE NEGATIVE Final    Comment: (NOTE) SARS-CoV-2 target nucleic acids are NOT DETECTED.  The SARS-CoV-2 RNA is generally detectable in upper respiratory specimens during the acute phase of infection. The lowest concentration of SARS-CoV-2 viral copies this assay can detect is 138 copies/mL. A negative result does not preclude SARS-Cov-2 infection and should not be used as the sole basis for treatment or other patient management decisions. A negative result may occur with  improper specimen collection/handling, submission of specimen other than nasopharyngeal  swab, presence of viral mutation(s) within the areas targeted by this assay, and inadequate number of viral copies(<138 copies/mL). A negative result must be combined with clinical observations, patient history, and epidemiological information. The expected result is Negative.  Fact Sheet for Patients:  BloggerCourse.com  Fact Sheet for Healthcare Providers:  SeriousBroker.it  This test is no t yet approved or cleared by the [place] FDA and  has been authorized for detection and/or diagnosis of SARS-CoV-2 by FDA under an Emergency Use Authorization (EUA). This EUA will remain  in effect (meaning this test can be used) for the duration of the COVID-19 declaration under Section 564(b)(1) of the Act, 21 U.S.C.section 360bbb-3(b)(1), unless the authorization is terminated  or revoked sooner.       Influenza A by PCR NEGATIVE NEGATIVE Final   Influenza B by PCR NEGATIVE NEGATIVE Final    Comment: (NOTE) The Xpert Xpress SARS-CoV-2/FLU/RSV plus assay is intended as an aid in the diagnosis of influenza from Nasopharyngeal swab specimens and should not be used as a sole basis for treatment. Nasal washings and aspirates are unacceptable for Xpert Xpress SARS-CoV-2/FLU/RSV testing.  Fact Sheet for Patients: BloggerCourse.com  Fact Sheet for Healthcare Providers: SeriousBroker.it  This test is not yet approved or cleared by the [place] FDA and has been authorized for detection and/or diagnosis of SARS-CoV-2 by FDA under an Emergency Use Authorization (EUA). This EUA will remain in effect (meaning this test can be used) for the duration of the COVID-19 declaration under Section 564(b)(1) of the Act, 21 U.S.C. section 360bbb-3(b)(1), unless the authorization is terminated or revoked.  Performed at [facility], [address] [place], [place] [address]   Culture, blood  (routine x 2)     Status: None (Preliminary result)   Collection Time: 08/23/20  8:07 PM   Specimen: BLOOD RIGHT ARM  Result Value Ref Range Status   Specimen Description BLOOD RIGHT ARM  Final   Special Requests   Final    Blood Culture results may not be optimal due to an inadequate volume of blood received in culture bottles BOTTLES DRAWN AEROBIC AND ANAEROBIC  Culture   Final    NO GROWTH 3 DAYS Performed at [facility], [address] [place], [place] [address]    Report Status PENDING  Incomplete  MRSA Next Gen by PCR, Nasal     Status: None   Collection Time: 08/24/20  3:43 AM   Specimen: Nasal Mucosa; Nasal Swab  Result Value Ref Range Status   MRSA by PCR Next Gen NOT DETECTED NOT DETECTED Final    Comment: (NOTE) The GeneXpert MRSA Assay (FDA approved for NASAL specimens only), is one component of a comprehensive MRSA colonization surveillance program. It is not intended to diagnose MRSA infection nor to guide or monitor treatment for MRSA infections. Test performance is not FDA approved in patients less than 68 years old. Performed at [facility], [address] [place], [place] [address]       Radiology Studies: CT ABDOMEN PELVIS W CONTRAST  Result Date: 08/25/2020 CLINICAL DATA:  Abdominal distension and elevated white blood cell count EXAM: CT ABDOMEN AND PELVIS WITH CONTRAST TECHNIQUE: Multidetector CT imaging of the abdomen and pelvis was performed using the standard protocol following bolus administration of intravenous contrast. CONTRAST:  85mL OMNIPAQUE IOHEXOL 350 MG/ML SOLN COMPARISON:  08/23/2020 FINDINGS: Lower chest: Lung bases demonstrate bibasilar consolidation with small effusions new from the prior exam. Bilateral breast implants are noted. Hepatobiliary: Scattered hypodensities are again seen throughout the liver consistent with cysts. Gallbladder demonstrates some minimal dependent density likely related to sludge. No definitive calcified stones are  noted. No biliary ductal dilatation is seen. Pancreas: Unremarkable. No pancreatic ductal dilatation or surrounding inflammatory changes. Spleen: Normal in size without focal abnormality. Adrenals/Urinary Tract: Adrenal glands are within normal limits. Kidneys demonstrate a normal enhancement pattern bilaterally. Normal excretion of contrast is seen. A few small cysts are noted. Ureters are within normal limits. Bladder is partially distended. Stomach/Bowel: Colons demonstrates scattered diverticular change is well as some inflammatory change in the region of the sigmoid these changes are similar to that noted on the prior exam. Stomach is well distended with contrast material. The small bowel is prominent with multiple fluid-filled dilated loops of small bowel identified. Some peristalsis is noted within the small bowel although it appears to be dilated to the level of mid ileum without discrete transition zone. Decreased bowel sounds are noted on physical exam in these changes may represent a generalized small bowel ileus. Vascular/Lymphatic: Aortic atherosclerosis. No enlarged abdominal or pelvic lymph nodes. Reproductive: Uterus and bilateral adnexa are unremarkable. Other: There is a new air-fluid collection identified in the right hemipelvis best seen on image number 74 of series 2 this is new from the prior exam and demonstrates a small amount of air within. This measures 5.8 x 4.3 cm in greatest dimension. Given the inflammatory changes and diverticulitis this likely represents a developing abscess from diverticular rupture. No other focal fluid collection is noted. Musculoskeletal: Degenerative changes of the lumbar spine are noted. IMPRESSION: New air-fluid collection in the right hemipelvis likely representing a developing abscess. This is most likely related to the underlying diverticulitis and possible diverticular rupture. Generalized small bowel dilatation with fluid-filled loops of small bowel. On  clinical exam there are decreased bowel sounds in these changes may represent a reactive small bowel ileus. Continued follow-up is recommended. New bilateral lower lobe consolidation with associated effusions consistent with atelectasis/early infiltrate. Electronically Signed   By: Alcide CleverMark  Lukens M.D.   On: 08/25/2020 17:12    Scheduled Meds:  Chlorhexidine Gluconate Cloth  6 each  Topical Daily   pantoprazole (PROTONIX) IV  40 mg Intravenous Q24H   Continuous Infusions:  sodium chloride 100 mL/hr at 08/26/20 0700   ceFEPime (MAXIPIME) IV 2 g (08/26/20 1018)   metronidazole 500 mg (08/26/20 0909)   promethazine (PHENERGAN) injection (IM or IVPB)       LOS: 3 days   Rickey Barbara, MD [facility] Pager On Amion  If 7PM-7AM, please contact night-coverage 08/26/2020, 3:58 PM

## 2020-08-26 NOTE — Consult Note (Signed)
Reason for Consult: Diverticulitis with perforation Referring Physician: Dr. Kirstie Mirza Anne Aguirre is an 70 y.o. female.  HPI: Patient is a 70 year old white female who was admitted to the hospital on 08/23/2020 with acute sigmoid diverticulitis and renal insufficiency.  She was started on IV antibiotics and bowel rest.  Yesterday, her leukocytosis worsened and a repeat CT scan of the abdomen revealed a developing abscess in the pelvis with speckles of free air outside the colon.  Patient's abdominal pain also worsened.  This morning, she states her abdominal pain is somewhat better, but her leukocytosis has increased to 25,000 despite being on IV antibiotics.  Patient has never had abdominal surgery.  She denies any nausea or vomiting.  Past Medical History:  Diagnosis Date   Essential hypertension, benign 10/01/2018   HLD (hyperlipidemia) 10/01/2018   Osteoarthritis 10/01/2018   Vitamin D deficiency disease 10/01/2018    Past Surgical History:  Procedure Laterality Date   TONSILLECTOMY Bilateral     Family History  Problem Relation Age of Onset   Hypertension Mother    Stroke Mother    Stroke Father    Hypertension Brother    Hypertension Son     Social History:  reports that she has never smoked. She has never used smokeless tobacco. She reports current alcohol use of about 7.0 standard drinks per week. She reports that she does not use drugs.  Allergies: No Known Allergies  Medications: I have reviewed the patient's current medications.  Results for orders placed or performed during the hospital encounter of 08/23/20 (from the past 48 hour(s))  Basic metabolic panel     Status: Abnormal   Collection Time: 08/25/20  4:00 AM  Result Value Ref Range   Sodium 129 (L) 135 - 145 mmol/L   Potassium 2.7 (LL) 3.5 - 5.1 mmol/L    Comment: CRITICAL RESULT CALLED TO, READ BACK BY AND VERIFIED WITH: FINSKY,P AT 5:40AM ON 08/25/20 BY FESTERMAN,C    Chloride 102 98 - 111 mmol/L   CO2 19 (L)  22 - 32 mmol/L   Glucose, Bld 81 70 - 99 mg/dL    Comment: Glucose reference range applies only to samples taken after fasting for at least 8 hours.   BUN 22 8 - 23 mg/dL   Creatinine, Ser 0.32 (H) 0.44 - 1.00 mg/dL   Calcium 7.9 (L) 8.9 - 10.3 mg/dL   GFR, Estimated 60 (L) >60 mL/min    Comment: (NOTE) Calculated using the CKD-EPI Creatinine Equation (2021)    Anion gap 8 5 - 15    Comment: Performed at [facility], [address], [place], [place] [address]  CBC     Status: Abnormal   Collection Time: 08/25/20  4:00 AM  Result Value Ref Range   WBC 14.3 (H) 4.0 - 10.5 K/uL   RBC 3.91 3.87 - 5.11 MIL/uL   Hemoglobin 12.8 12.0 - 15.0 g/dL   HCT 25.0 03.7 - 04.8 %   MCV 94.1 80.0 - 100.0 fL   MCH 32.7 26.0 - 34.0 pg   MCHC 34.8 30.0 - 36.0 g/dL   RDW 88.9 16.9 - 45.0 %   Platelets 323 150 - 400 K/uL   nRBC 0.0 0.0 - 0.2 %    Comment: Performed at [facility], [address] [place], [place] [address]  Phosphorus     Status: Abnormal   Collection Time: 08/25/20  4:00 AM  Result Value Ref Range   Phosphorus 2.2 (L) 2.5 - 4.6 mg/dL  Comment: Performed at [facility], [address], [place], [place] [address]  Magnesium     Status: Abnormal   Collection Time: 08/25/20  1:48 PM  Result Value Ref Range   Magnesium 2.9 (H) 1.7 - 2.4 mg/dL    Comment: Performed at [facility], [address] [place], [place] [address]  Basic metabolic panel     Status: Abnormal   Collection Time: 08/25/20  1:48 PM  Result Value Ref Range   Sodium 125 (L) 135 - 145 mmol/L   Potassium 3.6 3.5 - 5.1 mmol/L    Comment: DELTA CHECK NOTED   Chloride 98 98 - 111 mmol/L   CO2 18 (L) 22 - 32 mmol/L   Glucose, Bld 87 70 - 99 mg/dL    Comment: Glucose reference range applies only to samples taken after fasting for at least 8 hours.   BUN 20 8 - 23 mg/dL   Creatinine, Ser 8.25 0.44 - 1.00 mg/dL   Calcium 7.7 (L) 8.9 - 10.3 mg/dL   GFR, Estimated >05 >39 mL/min    Comment: (NOTE) Calculated using  the CKD-EPI Creatinine Equation (2021)    Anion gap 9 5 - 15    Comment: Performed at [facility], [address] [place], [place] [address]  Basic metabolic panel     Status: Abnormal   Collection Time: 08/26/20  4:05 AM  Result Value Ref Range   Sodium 127 (L) 135 - 145 mmol/L   Potassium 3.8 3.5 - 5.1 mmol/L   Chloride 103 98 - 111 mmol/L   CO2 15 (L) 22 - 32 mmol/L   Glucose, Bld 83 70 - 99 mg/dL    Comment: Glucose reference range applies only to samples taken after fasting for at least 8 hours.   BUN 18 8 - 23 mg/dL   Creatinine, Ser 1.93 0.44 - 1.00 mg/dL   Calcium 7.3 (L) 8.9 - 10.3 mg/dL   GFR, Estimated >79 >02 mL/min    Comment: (NOTE) Calculated using the CKD-EPI Creatinine Equation (2021)    Anion gap 9 5 - 15    Comment: Performed at [facility], [address], [place], [place] [address]  CBC     Status: Abnormal   Collection Time: 08/26/20  4:05 AM  Result Value Ref Range   WBC 25.0 (H) 4.0 - 10.5 K/uL   RBC 4.21 3.87 - 5.11 MIL/uL   Hemoglobin 13.7 12.0 - 15.0 g/dL   HCT 53.2 99.2 - 42.6 %   MCV 95.2 80.0 - 100.0 fL   MCH 32.5 26.0 - 34.0 pg   MCHC 34.2 30.0 - 36.0 g/dL   RDW 83.4 19.6 - 22.2 %   Platelets 416 (H) 150 - 400 K/uL   nRBC 0.0 0.0 - 0.2 %    Comment: Performed at [facility], [address] [place], [place] [address]  Lactic acid, plasma     Status: None   Collection Time: 08/26/20  9:35 AM  Result Value Ref Range   Lactic Acid, Venous 1.2 0.5 - 1.9 mmol/L    Comment: Performed at [facility], [address], [place], [place] [address]    CT ABDOMEN PELVIS W CONTRAST  Result Date: 08/25/2020 CLINICAL DATA:  Abdominal distension and elevated white blood cell count EXAM: CT ABDOMEN AND PELVIS WITH CONTRAST TECHNIQUE: Multidetector CT imaging of the abdomen and pelvis was performed using the standard protocol following bolus administration of intravenous contrast. CONTRAST:  78mL OMNIPAQUE IOHEXOL 350 MG/ML SOLN COMPARISON:  08/23/2020  FINDINGS: Lower chest: Lung bases demonstrate bibasilar consolidation with small effusions new from the prior exam. Bilateral breast implants are noted. Hepatobiliary: Scattered hypodensities are again seen throughout the liver consistent with cysts. Gallbladder demonstrates some minimal dependent density likely related to sludge. No definitive calcified stones are noted. No biliary ductal dilatation is seen. Pancreas: Unremarkable. No pancreatic ductal dilatation or surrounding inflammatory changes. Spleen: Normal in size without focal abnormality. Adrenals/Urinary Tract: Adrenal glands are within normal limits. Kidneys demonstrate a normal enhancement pattern bilaterally. Normal excretion of contrast is seen. A few small cysts are noted. Ureters are within normal limits. Bladder is partially distended. Stomach/Bowel: Colons demonstrates scattered diverticular change is well as some inflammatory change in the region of the sigmoid these changes are similar to that noted on the prior exam. Stomach is well distended with contrast material. The small bowel is prominent with multiple fluid-filled dilated loops of small bowel identified. Some peristalsis is noted within the small bowel although it appears to be dilated to the level of mid ileum without discrete transition zone. Decreased bowel sounds are noted on physical exam in these changes may represent a generalized small bowel ileus. Vascular/Lymphatic: Aortic atherosclerosis. No enlarged abdominal or pelvic lymph nodes. Reproductive: Uterus and bilateral adnexa are unremarkable. Other: There is a new air-fluid collection identified in the right hemipelvis best seen on image number 74 of series 2 this is new from the prior exam and demonstrates a small amount of air within. This measures 5.8 x 4.3 cm in greatest dimension. Given the inflammatory changes and diverticulitis this likely represents a developing abscess from diverticular rupture. No other focal fluid  collection is noted. Musculoskeletal: Degenerative changes of the lumbar spine are noted. IMPRESSION: New air-fluid collection in the right hemipelvis likely representing a developing abscess. This is most likely related to the underlying diverticulitis and possible diverticular rupture. Generalized small bowel dilatation with fluid-filled loops of small bowel. On clinical exam there are decreased bowel sounds in these changes may represent a reactive small bowel ileus. Continued follow-up is recommended. New bilateral lower lobe consolidation with associated effusions consistent with atelectasis/early infiltrate. Electronically Signed   By: Alcide Clever M.D.   On: 08/25/2020 17:12    ROS:  Pertinent items are noted in HPI.  Blood pressure (!) 142/66, pulse 79, temperature 98.1 F (36.7 C), temperature source Oral, resp. rate (!) 28, height 5\' 6"  (1.676 m), weight 61 kg, SpO2 100 %. Physical Exam: Pleasant white female no acute distress Head is normocephalic, atraumatic Lungs clear to auscultation with good breath sounds bilaterally Heart examination reveals regular rate and rhythm without S3, S4, murmurs Abdomen is soft and slightly distended.  No rigidity is noted.  She is sore in the suprapubic region.  CT scan images personally reviewed  Assessment/Plan: Impression: Perforated sigmoid diverticulitis with developing pelvic abscess and worsening leukocytosis.  I will try to avoid any surgical intervention acutely as she would probably require a colectomy with colostomy.  I have been in contact with interventional radiology who agree with trying a percutaneous drain placement.  I have also discussed this with the patient.  This is being arranged for today.  Further management is pending those results.  08/26/2020, 10:22 AM

## 2020-08-26 NOTE — Progress Notes (Signed)
Pharmacy Antibiotic Note  Anne Aguirre a 70 y.o. female admitted on 08/26/2020 with  intra abdominal infection .  Pharmacy has been consulted for cefepime dosing. CrCl improved Diverticulitis Plan: Increase Cefepime 2gm iv q12h Also on flagyl 500mg  IV q8h F/U cxs and clinical progress Monitor V/s, labs  Medical History: Past Medical History:  Diagnosis Date   Essential hypertension, benign 10/01/2018   HLD (hyperlipidemia) 10/01/2018   Osteoarthritis 10/01/2018   Vitamin D deficiency disease 10/01/2018    Allergies:  No Known Allergies  Filed Weights   08/23/20 1314 08/24/20 0400 08/25/20 0500  Weight: 56.2 kg (124 lb) 61.4 kg (135 lb 5.8 oz) 61 kg (134 lb 7.7 oz)    CBC Latest Ref Rng & Units 08/26/2020 08/25/2020 08/24/2020  WBC 4.0 - 10.5 K/uL 25.0(H) 14.3(H) 9.7  Hemoglobin 12.0 - 15.0 g/dL 10/24/2020 56.3 87.5  Hematocrit 36.0 - 46.0 % 40.1 36.8 37.7  Platelets 150 - 400 K/uL 416(H) 323 341     Estimated Creatinine Clearance: 55.4 mL/min (by C-G formula based on SCr of 0.91 mg/dL).  Antibiotics Given (last 72 hours)     Date/Time Action Medication Dose Rate   08/23/20 1716 New Bag/Given   ciprofloxacin (CIPRO) IVPB 400 mg 400 mg 200 mL/hr   08/23/20 1841 New Bag/Given   metroNIDAZOLE (FLAGYL) IVPB 500 mg 500 mg 100 mL/hr   08/23/20 2125 New Bag/Given   ceFEPIme (MAXIPIME) 2 g in sodium chloride 0.9 % 100 mL IVPB 2 g 200 mL/hr   08/24/20 0333 New Bag/Given   metroNIDAZOLE (FLAGYL) IVPB 500 mg 500 mg 100 mL/hr   08/24/20 10/24/20 New Bag/Given   metroNIDAZOLE (FLAGYL) IVPB 500 mg 500 mg 100 mL/hr   08/24/20 1222 New Bag/Given   ceFEPIme (MAXIPIME) 2 g in sodium chloride 0.9 % 100 mL IVPB 2 g 200 mL/hr   08/24/20 1730 New Bag/Given   metroNIDAZOLE (FLAGYL) IVPB 500 mg 500 mg 100 mL/hr   08/24/20 2111 New Bag/Given   ceFEPIme (MAXIPIME) 2 g in sodium chloride 0.9 % 100 mL IVPB 2 g 200 mL/hr   08/25/20 0008 New Bag/Given   metroNIDAZOLE (FLAGYL) IVPB 500 mg 500 mg 100 mL/hr    08/25/20 10/25/20 New Bag/Given   metroNIDAZOLE (FLAGYL) IVPB 500 mg 500 mg 100 mL/hr   08/25/20 1555 New Bag/Given   metroNIDAZOLE (FLAGYL) IVPB 500 mg 500 mg 100 mL/hr   08/25/20 2116 New Bag/Given   ceFEPIme (MAXIPIME) 2 g in sodium chloride 0.9 % 100 mL IVPB 2 g 200 mL/hr   08/26/20 0024 New Bag/Given   metroNIDAZOLE (FLAGYL) IVPB 500 mg 500 mg 100 mL/hr       Antimicrobials this admission: Cefepime 08/23/20 >>  Metronidazole 08/23/20 >>  Microbiology results: 08/23/20 BCx: sent 08/23/20 MRSA PCR neg  Thank you for allowing pharmacy to be a part of this patient's care.  10/23/20, PharmD Clinical Pharmacist

## 2020-08-27 ENCOUNTER — Inpatient Hospital Stay (HOSPITAL_COMMUNITY): Payer: Medicare PPO

## 2020-08-27 DIAGNOSIS — N179 Acute kidney failure, unspecified: Secondary | ICD-10-CM | POA: Diagnosis not present

## 2020-08-27 DIAGNOSIS — K5792 Diverticulitis of intestine, part unspecified, without perforation or abscess without bleeding: Secondary | ICD-10-CM | POA: Diagnosis not present

## 2020-08-27 DIAGNOSIS — I10 Essential (primary) hypertension: Secondary | ICD-10-CM | POA: Diagnosis not present

## 2020-08-27 LAB — PROTIME-INR
INR: 1.3 — ABNORMAL HIGH (ref 0.8–1.2)
Prothrombin Time: 16.4 s — ABNORMAL HIGH (ref 11.4–15.2)

## 2020-08-27 LAB — BASIC METABOLIC PANEL WITH GFR
Anion gap: 15 (ref 5–15)
BUN: 15 mg/dL (ref 8–23)
CO2: 13 mmol/L — ABNORMAL LOW (ref 22–32)
Calcium: 6.8 mg/dL — ABNORMAL LOW (ref 8.9–10.3)
Chloride: 104 mmol/L (ref 98–111)
Creatinine, Ser: 0.98 mg/dL (ref 0.44–1.00)
GFR, Estimated: >60 mL/min
Glucose, Bld: 75 mg/dL (ref 70–99)
Potassium: 3.2 mmol/L — ABNORMAL LOW (ref 3.5–5.1)
Sodium: 132 mmol/L — ABNORMAL LOW (ref 135–145)

## 2020-08-27 LAB — CBC
HCT: 38.8 % (ref 36.0–46.0)
Hemoglobin: 12.7 g/dL (ref 12.0–15.0)
MCH: 33.2 pg (ref 26.0–34.0)
MCHC: 32.7 g/dL (ref 30.0–36.0)
MCV: 101.6 fL — ABNORMAL HIGH (ref 80.0–100.0)
Platelets: 331 K/uL (ref 150–400)
RBC: 3.82 MIL/uL — ABNORMAL LOW (ref 3.87–5.11)
RDW: 13.2 % (ref 11.5–15.5)
WBC: 24.3 K/uL — ABNORMAL HIGH (ref 4.0–10.5)
nRBC: 0 % (ref 0.0–0.2)

## 2020-08-27 LAB — PREALBUMIN: Prealbumin: 5.1 mg/dL — ABNORMAL LOW (ref 18–38)

## 2020-08-27 LAB — PHOSPHORUS: Phosphorus: 1.1 mg/dL — ABNORMAL LOW (ref 2.5–4.6)

## 2020-08-27 LAB — MAGNESIUM: Magnesium: 1.7 mg/dL (ref 1.7–2.4)

## 2020-08-27 MED ORDER — FENTANYL CITRATE (PF) 100 MCG/2ML IJ SOLN
INTRAMUSCULAR | Status: AC
  Filled 2020-08-27: qty 4

## 2020-08-27 MED ORDER — SODIUM CHLORIDE 0.9 % IV SOLN
1.0000 g | INTRAVENOUS | Status: AC
  Administered 2020-08-27 – 2020-09-08 (×13): 1000 mg via INTRAVENOUS
  Filled 2020-08-27 (×15): qty 1

## 2020-08-27 MED ORDER — MIDAZOLAM HCL 2 MG/2ML IJ SOLN
INTRAMUSCULAR | Status: AC
  Filled 2020-08-27: qty 4

## 2020-08-27 MED ORDER — DOCUSATE SODIUM 100 MG PO CAPS
100.0000 mg | ORAL_CAPSULE | Freq: Two times a day (BID) | ORAL | Status: DC
  Administered 2020-08-27 – 2020-08-28 (×2): 100 mg via ORAL
  Filled 2020-08-27 (×3): qty 1

## 2020-08-27 MED ORDER — LIDOCAINE HCL 1 % IJ SOLN
INTRAMUSCULAR | Status: AC
  Filled 2020-08-27: qty 10

## 2020-08-27 MED ORDER — CALCIUM GLUCONATE-NACL 1-0.675 GM/50ML-% IV SOLN
1.0000 g | Freq: Once | INTRAVENOUS | Status: AC
  Administered 2020-08-27: 1000 mg via INTRAVENOUS
  Filled 2020-08-27: qty 50

## 2020-08-27 MED ORDER — POLYETHYLENE GLYCOL 3350 17 G PO PACK
17.0000 g | PACK | Freq: Every day | ORAL | Status: DC
  Administered 2020-08-27 – 2020-08-28 (×2): 17 g via ORAL
  Filled 2020-08-27 (×2): qty 1

## 2020-08-27 MED ORDER — POTASSIUM PHOSPHATES 15 MMOLE/5ML IV SOLN
30.0000 mmol | Freq: Once | INTRAVENOUS | Status: AC
  Administered 2020-08-27: 30 mmol via INTRAVENOUS
  Filled 2020-08-27: qty 10

## 2020-08-27 MED ORDER — MAGNESIUM SULFATE 2 GM/50ML IV SOLN
2.0000 g | Freq: Once | INTRAVENOUS | Status: AC
  Administered 2020-08-27: 2 g via INTRAVENOUS
  Filled 2020-08-27: qty 50

## 2020-08-27 NOTE — Progress Notes (Addendum)
Patient ID: Anne Aguirre, female   DOB: 1950/06/14, 70 y.o.   MRN: [id number] Unchanged, wbc still high, getting drain today Hopefully this gets her better She does have either an ileus or sbo related to this with prealbumin this is very low. I would recommend TPN With no ability to drain and increasing wbc recommend switching to invanz

## 2020-08-27 NOTE — Progress Notes (Addendum)
PROGRESS NOTE    Anne Aguirre  WJX:[id number] DOB: 11/30/1950 DOA: 08/23/2020 PCP: Pcp, No    Brief Narrative:   70 y.o. female with past medical history of hypertension presented to the hospital with abdominal pain nausea vomiting and cramps.  Patient was constipated prior to presentation.  He was unable to tolerate any food.  In the ED she was noted to have a diverticulitis with developing abscess.  Patient was then considered for admission to [facility] for further IR intervention and surgical follow-up.    Assessment & Plan:   Principal Problem:   Acute diverticulitis Active Problems:   Essential hypertension, benign   Acute renal failure superimposed on stage 3a chronic kidney disease (HCC)   Hyponatremia   Hyperbilirubinemia   Hypercalcemia  Acute sigmoid diverticulitis, now with perforation and developing abscess with sepsis present on admission Continue Flagyl and cefepime.  Patient went for CT-guided abscess drainage today but abscess was so tiny that they could not be drained..  General surgery following as well.  Significant leukocytosis on presentation.  Blood cultures negative so far.  Monitor closely.  We will start the patient on clears.  Follow general surgery recommendation.  Ambulatory able.   Acute kidney injury superimposed on CKD IIIa  Improved with IV fluids.  Serum creatinine was 2.04 on admission.  We will continue to monitor closely.  Hypokalemia.  We will replenish with K-Phos.  We will also give 2 g of magnesium.  Hypophosphatemia.  Will replenish with K-Phos.  Check levels in a.m.   Hyponatremia  Likely secondary to hypovolemia.  We will continue to monitor closely.  Has slightly improved compared to yesterday.   Hypocalcemia.  Corrected calcium of around 7.9.  Give calcium gluconate 1 g.  Monitor closely.   Hyperbilirubinemia  Bilirubin of 2.0 on admission.  Could be secondary to sepsis.  Liver cyst on the CT scan.  We will continue to monitor  liver function in AM.     Essential hypertension  Recent blood pressure was low.  We will continue to hold antihypertensive medication.   DVT prophylaxis: SCD's  Code Status: Full  Family Communication: Spoke with the patient's family at bedside.  Status is: Inpatient  Remains inpatient appropriate because:Inpatient level of care appropriate due to severity of illness  Dispo: The patient is from: Home              Anticipated d/c is to: Home              Patient currently is not medically stable to d/c.   Difficult to place patient No   Consultants:  General Surgery IR  Procedures:  None.  Antimicrobials: Anti-infectives (From admission, onward)    Start     Dose/Rate Route Frequency Ordered Stop   08/24/20 1145  ceFEPIme (MAXIPIME) 2 g in sodium chloride 0.9 % 100 mL IVPB        2 g 200 mL/hr over 30 Minutes Intravenous Every 12 hours 08/24/20 1051     08/23/20 2100  ceFEPIme (MAXIPIME) 2 g in sodium chloride 0.9 % 100 mL IVPB  Status:  Discontinued        2 g 200 mL/hr over 30 Minutes Intravenous Every 24 hours 08/23/20 2051 08/24/20 1051   08/23/20 1700  ciprofloxacin (CIPRO) IVPB 400 mg  Status:  Discontinued        400 mg 200 mL/hr over 60 Minutes Intravenous Every 12 hours 08/23/20 1645 08/23/20 2041   08/23/20 1700  metroNIDAZOLE (FLAGYL) IVPB 500 mg        500 mg 100 mL/hr over 60 Minutes Intravenous Every 8 hours 08/23/20 1645         Subjective:  Today, patient was seen and examined at bedside.  Not much pain after morphine.  Denies any nausea or vomiting at the time of my examination.  Has had a bowel movement.  Objective:  Vitals:   08/27/20 0847 08/27/20 0941 08/27/20 0950 08/27/20 0955  BP: 116/75 117/74 108/68 116/74  Pulse: 90 94 90 88  Resp: (!) 29 (!) 26 (!) 28 (!) 26  Temp: 97.6 F (36.4 C)     TempSrc: Oral     SpO2: 98% 99% 99% 99%  Weight:      Height:        Intake/Output Summary (Last 24 hours) at 08/27/2020 1044 Last data  filed at 08/27/2020 0600 Gross per 24 hour  Intake 1000 ml  Output 350 ml  Net 650 ml    Filed Weights   08/23/20 1314 08/24/20 0400 08/25/20 0500  Weight: 56.2 kg 61.4 kg 61 kg    Physical examination: General:  Average built, not in obvious distress, on nasal cannula oxygen HENT:   No scleral pallor or icterus noted. Oral mucosa is moist.  Chest:  Clear breath sounds.  Diminished breath sounds bilaterally. No crackles or wheezes.  CVS: S1 &S2 heard. No murmur.  Regular rate and rhythm. Abdomen: Mild tenderness over the left lower quadrant on deep palpation. Extremities: No cyanosis, clubbing or edema.  Peripheral pulses are palpable. Psych: Alert, awake and oriented, normal mood CNS:  No cranial nerve deficits.  Power equal in all extremities.   Skin: Warm and dry.  No rashes noted.  Data Reviewed: I have personally reviewed following labs and imaging studies  CBC: Recent Labs  Lab 08/23/20 1341 08/24/20 0405 08/25/20 0400 08/26/20 0405 08/27/20 0308  WBC 19.1* 9.7 14.3* 25.0* 24.3*  HGB 15.0 12.6 12.8 13.7 12.7  HCT 42.6 37.7 36.8 40.1 38.8  MCV 94.2 97.9 94.1 95.2 101.6*  PLT 443* 341 323 416* 331    Basic Metabolic Panel: Recent Labs  Lab 08/24/20 0405 08/25/20 0400 08/25/20 1348 08/26/20 0405 08/27/20 0456  NA 135 129* 125* 127* 132*  K 3.2* 2.7* 3.6 3.8 3.2*  CL 106 102 98 103 104  CO2 21* 19* 18* 15* 13*  GLUCOSE 85 81 87 83 75  BUN 30* 22 20 18 15   CREATININE 1.42* 1.02* 0.97 0.91 0.98  CALCIUM 8.7* 7.9* 7.7* 7.3* 6.8*  MG 1.4*  --  2.9*  --  1.7  PHOS <1.0* 2.2*  --   --  1.1*    GFR: Estimated Creatinine Clearance: 51.4 mL/min (by C-G formula based on SCr of 0.98 mg/dL). Liver Function Tests: Recent Labs  Lab 08/23/20 1341 08/24/20 0405  AST 15 18  ALT 12 11  ALKPHOS 62 47  BILITOT 2.0* 1.3*  PROT 7.5 5.8*  ALBUMIN 3.4* 2.6*    Recent Labs  Lab 08/23/20 1341  LIPASE 20    No results for input(s): AMMONIA in the last 168  hours. Coagulation Profile: Recent Labs  Lab 08/27/20 0456  INR 1.3*   Cardiac Enzymes: No results for input(s): CKTOTAL, CKMB, CKMBINDEX, TROPONINI in the last 168 hours. BNP (last 3 results) No results for input(s): PROBNP in the last 8760 hours. HbA1C: No results for input(s): HGBA1C in the last 72 hours. CBG: No results for input(s): GLUCAP in the  last 168 hours. Lipid Profile: No results for input(s): CHOL, HDL, LDLCALC, TRIG, CHOLHDL, LDLDIRECT in the last 72 hours. Thyroid Function Tests: No results for input(s): TSH, T4TOTAL, FREET4, T3FREE, THYROIDAB in the last 72 hours. Anemia Panel: No results for input(s): VITAMINB12, FOLATE, FERRITIN, TIBC, IRON, RETICCTPCT in the last 72 hours. Sepsis Labs: Recent Labs  Lab 08/23/20 2215 08/24/20 0405 08/26/20 0935 08/26/20 1226  LATICACIDVEN 2.0* 1.4 1.2 1.4     Recent Results (from the past 240 hour(s))  Resp Panel by RT-PCR (Flu A&B, Covid) Nasopharyngeal Swab     Status: None   Collection Time: 08/23/20  3:35 PM   Specimen: Nasopharyngeal Swab; Nasopharyngeal(NP) swabs in vial transport medium  Result Value Ref Range Status   SARS Coronavirus 2 by RT PCR NEGATIVE NEGATIVE Final    Comment: (NOTE) SARS-CoV-2 target nucleic acids are NOT DETECTED.  The SARS-CoV-2 RNA is generally detectable in upper respiratory specimens during the acute phase of infection. The lowest concentration of SARS-CoV-2 viral copies this assay can detect is 138 copies/mL. A negative result does not preclude SARS-Cov-2 infection and should not be used as the sole basis for treatment or other patient management decisions. A negative result may occur with  improper specimen collection/handling, submission of specimen other than nasopharyngeal swab, presence of viral mutation(s) within the areas targeted by this assay, and inadequate number of viral copies(<138 copies/mL). A negative result must be combined with clinical observations, patient  history, and epidemiological information. The expected result is Negative.  Fact Sheet for Patients:  BloggerCourse.com  Fact Sheet for Healthcare Providers:  SeriousBroker.it  This test is no t yet approved or cleared by the [place] FDA and  has been authorized for detection and/or diagnosis of SARS-CoV-2 by FDA under an Emergency Use Authorization (EUA). This EUA will remain  in effect (meaning this test can be used) for the duration of the COVID-19 declaration under Section 564(b)(1) of the Act, 21 U.S.C.section 360bbb-3(b)(1), unless the authorization is terminated  or revoked sooner.       Influenza A by PCR NEGATIVE NEGATIVE Final   Influenza B by PCR NEGATIVE NEGATIVE Final    Comment: (NOTE) The Xpert Xpress SARS-CoV-2/FLU/RSV plus assay is intended as an aid in the diagnosis of influenza from Nasopharyngeal swab specimens and should not be used as a sole basis for treatment. Nasal washings and aspirates are unacceptable for Xpert Xpress SARS-CoV-2/FLU/RSV testing.  Fact Sheet for Patients: BloggerCourse.com  Fact Sheet for Healthcare Providers: SeriousBroker.it  This test is not yet approved or cleared by the [place] FDA and has been authorized for detection and/or diagnosis of SARS-CoV-2 by FDA under an Emergency Use Authorization (EUA). This EUA will remain in effect (meaning this test can be used) for the duration of the COVID-19 declaration under Section 564(b)(1) of the Act, 21 U.S.C. section 360bbb-3(b)(1), unless the authorization is terminated or revoked.  Performed at [facility], [address] [place], [place] [address]   Culture, blood (routine x 2)     Status: None (Preliminary result)   Collection Time: 08/23/20  8:07 PM   Specimen: BLOOD RIGHT ARM  Result Value Ref Range Status   Specimen Description BLOOD RIGHT ARM  Final   Special  Requests   Final    Blood Culture results may not be optimal due to an inadequate volume of blood received in culture bottles BOTTLES DRAWN AEROBIC AND ANAEROBIC   Culture   Final    NO GROWTH 3 DAYS Performed  at [facility], [address] [place], [place] [address]    Report Status PENDING  Incomplete  MRSA Next Gen by PCR, Nasal     Status: None   Collection Time: 08/24/20  3:43 AM   Specimen: Nasal Mucosa; Nasal Swab  Result Value Ref Range Status   MRSA by PCR Next Gen NOT DETECTED NOT DETECTED Final    Comment: (NOTE) The GeneXpert MRSA Assay (FDA approved for NASAL specimens only), is one component of a comprehensive MRSA colonization surveillance program. It is not intended to diagnose MRSA infection nor to guide or monitor treatment for MRSA infections. Test performance is not FDA approved in patients less than 42 years old. Performed at [facility], [address], [place], [place] [address]       Radiology Studies: CT ABDOMEN PELVIS W CONTRAST  Result Date: 08/25/2020 CLINICAL DATA:  Abdominal distension and elevated white blood cell count EXAM: CT ABDOMEN AND PELVIS WITH CONTRAST TECHNIQUE: Multidetector CT imaging of the abdomen and pelvis was performed using the standard protocol following bolus administration of intravenous contrast. CONTRAST:  53mL OMNIPAQUE IOHEXOL 350 MG/ML SOLN COMPARISON:  08/23/2020 FINDINGS: Lower chest: Lung bases demonstrate bibasilar consolidation with small effusions new from the prior exam. Bilateral breast implants are noted. Hepatobiliary: Scattered hypodensities are again seen throughout the liver consistent with cysts. Gallbladder demonstrates some minimal dependent density likely related to sludge. No definitive calcified stones are noted. No biliary ductal dilatation is seen. Pancreas: Unremarkable. No pancreatic ductal dilatation or surrounding inflammatory changes. Spleen: Normal in size without focal abnormality. Adrenals/Urinary Tract:  Adrenal glands are within normal limits. Kidneys demonstrate a normal enhancement pattern bilaterally. Normal excretion of contrast is seen. A few small cysts are noted. Ureters are within normal limits. Bladder is partially distended. Stomach/Bowel: Colons demonstrates scattered diverticular change is well as some inflammatory change in the region of the sigmoid these changes are similar to that noted on the prior exam. Stomach is well distended with contrast material. The small bowel is prominent with multiple fluid-filled dilated loops of small bowel identified. Some peristalsis is noted within the small bowel although it appears to be dilated to the level of mid ileum without discrete transition zone. Decreased bowel sounds are noted on physical exam in these changes may represent a generalized small bowel ileus. Vascular/Lymphatic: Aortic atherosclerosis. No enlarged abdominal or pelvic lymph nodes. Reproductive: Uterus and bilateral adnexa are unremarkable. Other: There is a new air-fluid collection identified in the right hemipelvis best seen on image number 74 of series 2 this is new from the prior exam and demonstrates a small amount of air within. This measures 5.8 x 4.3 cm in greatest dimension. Given the inflammatory changes and diverticulitis this likely represents a developing abscess from diverticular rupture. No other focal fluid collection is noted. Musculoskeletal: Degenerative changes of the lumbar spine are noted. IMPRESSION: New air-fluid collection in the right hemipelvis likely representing a developing abscess. This is most likely related to the underlying diverticulitis and possible diverticular rupture. Generalized small bowel dilatation with fluid-filled loops of small bowel. On clinical exam there are decreased bowel sounds in these changes may represent a reactive small bowel ileus. Continued follow-up is recommended. New bilateral lower lobe consolidation with associated effusions  consistent with atelectasis/early infiltrate. Electronically Signed   By: Alcide Clever M.D.   On: 08/25/2020 17:12    Scheduled Meds:  Chlorhexidine Gluconate Cloth  6 each Topical Daily   lidocaine       pantoprazole (  PROTONIX) IV  40 mg Intravenous Q24H   Continuous Infusions:  sodium chloride 100 mL/hr at 08/26/20 0700   calcium gluconate     ceFEPime (MAXIPIME) IV 2 g (08/27/20 0834)   magnesium sulfate bolus IVPB     metronidazole 500 mg (08/27/20 0834)   potassium PHOSPHATE IVPB (in mmol)     promethazine (PHENERGAN) injection (IM or IVPB)       LOS: 4 days   Joycelyn Das, MD [facility] If 7PM-7AM, please contact night-coverage 08/27/2020, 10:44 AM

## 2020-08-27 NOTE — Sedation Documentation (Signed)
Doctor spoke with patient regarding procedure and will not perform procedure at this time.

## 2020-08-27 NOTE — Consult Note (Signed)
Chief Complaint: Patient was seen in consultation today for  Chief Complaint  Patient presents with   Abdominal Pain    Referring Physician(s): Dr. Franky Macho  Supervising Physician: Gilmer Mor  Patient Status: [facility] - In-pt  History of Present Illness: Amanee Iacovelli is a 70 y.o. female with a medical history significant for HTN. She was admitted to [facility] 08/23/20 with acute sigmoid diverticulitis and renal insufficiency. She was started on IV antibiotics and bowel rest but unfortunately  her leukocytosis worsened and imaging showed an abscess formation.   CT abdomen/pelvis with contrast 08/25/20 Other: There is a new air-fluid collection identified in the right hemipelvis best seen on image number 74 of series 2 this is new from the prior exam and demonstrates a small amount of air within. This measures 5.8 x 4.3 cm in greatest dimension. Given the inflammatory changes and diverticulitis this likely represents a developing abscess from diverticular rupture. No other focal fluid collection is noted. IMPRESSION: 1. New air-fluid collection in the right hemipelvis likely representing a developing abscess. This is most likely related to the underlying diverticulitis and possible diverticular rupture. 2. Generalized small bowel dilatation with fluid-filled loops of small bowel. On clinical exam there are decreased bowel sounds in these changes may represent a reactive small bowel ileus. Continued follow-up is recommended. 3. New bilateral lower lobe consolidation with associated effusions consistent with atelectasis/early infiltrate.  Interventional Radiology was asked to evaluate this patient for an image-guided diverticular abscess aspiration with drain placement. This case was reviewed and procedure approved by Dr. Loreta Ave. The patient was transferred from [facility] to [facility] for this procedure and for further care.    Past Medical History:   Diagnosis Date   Essential hypertension, benign 10/01/2018   HLD (hyperlipidemia) 10/01/2018   Osteoarthritis 10/01/2018   Vitamin D deficiency disease 10/01/2018    Past Surgical History:  Procedure Laterality Date   TONSILLECTOMY Bilateral     Allergies: Patient has no known allergies.  Medications: Prior to Admission medications   Medication Sig Start Date End Date Taking? Authorizing Provider  amLODipine (NORVASC) 10 MG tablet Take 1 tablet (10 mg total) by mouth daily. 03/30/20  Yes Gosrani, Nimish C, MD  Cholecalciferol (VITAMIN D-3) 125 MCG (5000 UT) TABS Take 10,000 Units by mouth daily.   Yes [provider]  irbesartan (AVAPRO) 300 MG tablet Take 1 tablet (300 mg total) by mouth daily. 05/30/20  Yes Gosrani, Nimish C, MD  Nebivolol HCl 20 MG TABS Take 1 tablet (20 mg total) by mouth daily at 12 noon. 05/30/20  Yes Wilson Singer, MD     Family History  Problem Relation Age of Onset   Hypertension Mother    Stroke Mother    Stroke Father    Hypertension Brother    Hypertension Son     Social History   Socioeconomic History   Marital status: Married    Spouse name: Not on file   Number of children: Not on file   Years of education: Not on file   Highest education level: Not on file  Occupational History   Not on file  Tobacco Use   Smoking status: Never   Smokeless tobacco: Never  Vaping Use   Vaping Use: Never used  Substance and Sexual Activity   Alcohol use: Yes    Alcohol/week: 7.0 standard drinks    Types: 7 Glasses of wine per week    Comment: Red wine   Drug  use: Never   Sexual activity: Not on file  Other Topics Concern   Not on file  Social History Narrative   Married for 12 years.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   Social Determinants of Health   Financial Resource Strain: Not on file  Food Insecurity: Not on file  Transportation Needs: Not on file  Physical Activity: Not on file  Stress: Not on file  Social Connections: Not  on file    Review of Systems: A 12 point ROS discussed and pertinent positives are indicated in the HPI above.  All other systems are negative.  Review of Systems  Constitutional:  Positive for appetite change and fatigue.  Respiratory:  Positive for shortness of breath. Negative for cough.   Cardiovascular:  Negative for chest pain and leg swelling.  Gastrointestinal:  Positive for abdominal pain. Negative for diarrhea and vomiting.  Neurological:  Negative for dizziness and headaches.   Vital Signs: BP 119/75   Pulse 94   Temp 98 F (36.7 C) (Oral)   Resp (!) 23   Ht 5\' 6"  (1.676 m)   Wt 134 lb 7.7 oz (61 kg)   SpO2 97%   BMI 21.71 kg/m   Physical Exam Constitutional:      General: She is not in acute distress.    Appearance: She is not ill-appearing.  HENT:     Mouth/Throat:     Mouth: Mucous membranes are moist.     Pharynx: Oropharynx is clear.  Cardiovascular:     Rate and Rhythm: Normal rate and regular rhythm.  Pulmonary:     Effort: Pulmonary effort is normal.     Breath sounds: Normal breath sounds.  Abdominal:     General: Bowel sounds are normal.     Palpations: Abdomen is soft.     Tenderness: There is abdominal tenderness.     Comments: LLQ/lower mid abdominal tenderness  Musculoskeletal:     Right lower leg: No edema.     Left lower leg: No edema.  Skin:    General: Skin is warm and dry.  Neurological:     Mental Status: She is alert and oriented to person, place, and time.  Psychiatric:        Mood and Affect: Mood normal.        Behavior: Behavior normal.        Thought Content: Thought content normal.        Judgment: Judgment normal.    Imaging: CT ABDOMEN PELVIS WO CONTRAST  Result Date: 08/23/2020 CLINICAL DATA:  Lower abdominal pain EXAM: CT ABDOMEN AND PELVIS WITHOUT CONTRAST TECHNIQUE: Multidetector CT imaging of the abdomen and pelvis was performed following the standard protocol without IV contrast. COMPARISON:  None. FINDINGS:  Lower chest: Lung bases demonstrate no acute consolidation or effusion. Partially visualized breast prostheses. Mild circumferential thickening of the distal esophagus. Hepatobiliary: Hypodense liver lesions likely cysts. Additional subcentimeter hypodensities too small to further characterize. No calcified gallstone or biliary dilatation Pancreas: Unremarkable. No pancreatic ductal dilatation or surrounding inflammatory changes. Spleen: Normal in size without focal abnormality. Adrenals/Urinary Tract: Adrenal glands are normal. Kidneys show no hydronephrosis. The bladder is slightly thick walled Stomach/Bowel: The stomach is nonenlarged. No dilated small bowel. Fluid distension of the colon. Mild wall thickening with inflammatory change at the sigmoid colon with diverticula present. Vascular/Lymphatic: Mild to moderate aortic atherosclerosis. No aneurysm. No suspicious nodes. Reproductive: Uterus and bilateral adnexa are unremarkable. Other: No free air.  Small fluid in the pelvis Musculoskeletal: No acute  osseous abnormality. Degenerative changes of the spine. IMPRESSION: 1. Mild wall thickening with associated inflammatory change at sigmoid colon with diverticula present, likely reflecting an acute diverticulitis. No definite perforation or abscess. 2. Moderate fluid distension of the colon upstream to the area of sigmoid colon inflammation. No evidence for small bowel obstruction. 3. Mild circumferential distal esophageal thickening questionable for reflux or esophagitis. Electronically Signed   By: Jasmine Pang M.D.   On: 08/23/2020 16:34   DG Chest 2 View  Result Date: 08/23/2020 CLINICAL DATA:  Nausea vomiting EXAM: CHEST - 2 VIEW COMPARISON:  None. FINDINGS: The heart size and mediastinal contours are within normal limits. Both lungs are clear. The visualized skeletal structures are unremarkable. IMPRESSION: No active cardiopulmonary disease. Electronically Signed   By: Jasmine Pang M.D.   On:  08/23/2020 16:34   CT ABDOMEN PELVIS W CONTRAST  Result Date: 08/25/2020 CLINICAL DATA:  Abdominal distension and elevated white blood cell count EXAM: CT ABDOMEN AND PELVIS WITH CONTRAST TECHNIQUE: Multidetector CT imaging of the abdomen and pelvis was performed using the standard protocol following bolus administration of intravenous contrast. CONTRAST:  89mL OMNIPAQUE IOHEXOL 350 MG/ML SOLN COMPARISON:  08/23/2020 FINDINGS: Lower chest: Lung bases demonstrate bibasilar consolidation with small effusions new from the prior exam. Bilateral breast implants are noted. Hepatobiliary: Scattered hypodensities are again seen throughout the liver consistent with cysts. Gallbladder demonstrates some minimal dependent density likely related to sludge. No definitive calcified stones are noted. No biliary ductal dilatation is seen. Pancreas: Unremarkable. No pancreatic ductal dilatation or surrounding inflammatory changes. Spleen: Normal in size without focal abnormality. Adrenals/Urinary Tract: Adrenal glands are within normal limits. Kidneys demonstrate a normal enhancement pattern bilaterally. Normal excretion of contrast is seen. A few small cysts are noted. Ureters are within normal limits. Bladder is partially distended. Stomach/Bowel: Colons demonstrates scattered diverticular change is well as some inflammatory change in the region of the sigmoid these changes are similar to that noted on the prior exam. Stomach is well distended with contrast material. The small bowel is prominent with multiple fluid-filled dilated loops of small bowel identified. Some peristalsis is noted within the small bowel although it appears to be dilated to the level of mid ileum without discrete transition zone. Decreased bowel sounds are noted on physical exam in these changes may represent a generalized small bowel ileus. Vascular/Lymphatic: Aortic atherosclerosis. No enlarged abdominal or pelvic lymph nodes. Reproductive: Uterus and  bilateral adnexa are unremarkable. Other: There is a new air-fluid collection identified in the right hemipelvis best seen on image number 74 of series 2 this is new from the prior exam and demonstrates a small amount of air within. This measures 5.8 x 4.3 cm in greatest dimension. Given the inflammatory changes and diverticulitis this likely represents a developing abscess from diverticular rupture. No other focal fluid collection is noted. Musculoskeletal: Degenerative changes of the lumbar spine are noted. IMPRESSION: New air-fluid collection in the right hemipelvis likely representing a developing abscess. This is most likely related to the underlying diverticulitis and possible diverticular rupture. Generalized small bowel dilatation with fluid-filled loops of small bowel. On clinical exam there are decreased bowel sounds in these changes may represent a reactive small bowel ileus. Continued follow-up is recommended. New bilateral lower lobe consolidation with associated effusions consistent with atelectasis/early infiltrate. Electronically Signed   By: Alcide Clever M.D.   On: 08/25/2020 17:12    Labs:  CBC: Recent Labs    08/24/20 0405 08/25/20 0400 08/26/20 0405 08/27/20 0308  WBC 9.7 14.3* 25.0* 24.3*  HGB 12.6 12.8 13.7 12.7  HCT 37.7 36.8 40.1 38.8  PLT 341 323 416* 331    COAGS: Recent Labs    08/27/20 0456  INR 1.3*    BMP: Recent Labs    03/20/20 0000 04/04/20 0000 08/23/20 1341 08/25/20 0400 08/25/20 1348 08/26/20 0405 08/27/20 0456  NA 133* 134*   < > 129* 125* 127* 132*  K 3.9 4.1   < > 2.7* 3.6 3.8 3.2*  CL 97* 97*   < > 102 98 103 104  CO2 25 24   < > 19* 18* 15* 13*  GLUCOSE 169* 89   < > 81 87 83 75  BUN 15 13   < > 22 20 18 15   CALCIUM 11.4* 10.6*   < > 7.9* 7.7* 7.3* 6.8*  CREATININE 1.04* 1.01*   < > 1.02* 0.97 0.91 0.98  GFRNONAA 55* 57*   < > 60* >60 >60 >60  GFRAA 64 66  --   --   --   --   --    < > = values in this interval not displayed.     LIVER FUNCTION TESTS: Recent Labs    03/20/20 0000 04/04/20 0000 08/23/20 1341 08/24/20 0405  BILITOT 0.7 0.8 2.0* 1.3*  AST 27 33 15 18  ALT 16 20 12 11   ALKPHOS  --   --  62 47  PROT 6.9 7.5 7.5 5.8*  ALBUMIN  --   --  3.4* 2.6*    TUMOR MARKERS: No results for input(s): AFPTM, CEA, CA199, CHROMGRNA in the last 8760 hours.  Assessment and Plan:  Diverticulitis with abscess: Mauricio PoLynne Rumery, 70 year old female, is scheduled today for an image-guided diverticular abscess aspiration with drain placement. The procedure was discussed with the patient and her husband at the bedside.  Risks and benefits discussed with the patient including bleeding, infection, damage to adjacent structures, bowel perforation/fistula connection, and sepsis.  All of the patient's questions were answered, patient is agreeable to proceed. She has been NPO and has not received any blood-thinning medications.   Consent signed and in IR.  Thank you for this interesting consult.  I greatly enjoyed meeting Mauricio PoLynne Wussow and look forward to participating in their care.  A copy of this report was sent to the requesting provider on this date.  Electronically Signed: Alwyn RenJamie Kenlee Vogt, AGACNP-BC [phone] 08/27/2020, 8:07 AM   I spent a total of 20 Minutes    in face to face in clinical consultation, greater than 50% of which was counseling/coordinating care for diverticular abscess drain.

## 2020-08-27 NOTE — Procedures (Signed)
Interventional Radiology Procedure Note  Procedure:   Attempt at CT guided aspiration/drainage of pelvic abscess.   The collection is decreasing in size vs redistribution in the prone position.    No target for transgluteal drainage. .  Recommendations: - continue current measures. - patient reports feeling much better, but constipation "bc of pain medication" - if change/deterioration, would repeat scan, but if continues to improve, do not see need to plan for additional imaging  Signed,  Yvone Neu. Loreta Ave, DO

## 2020-08-28 DIAGNOSIS — I10 Essential (primary) hypertension: Secondary | ICD-10-CM | POA: Diagnosis not present

## 2020-08-28 DIAGNOSIS — K5792 Diverticulitis of intestine, part unspecified, without perforation or abscess without bleeding: Secondary | ICD-10-CM | POA: Diagnosis not present

## 2020-08-28 DIAGNOSIS — N179 Acute kidney failure, unspecified: Secondary | ICD-10-CM | POA: Diagnosis not present

## 2020-08-28 LAB — BASIC METABOLIC PANEL
Anion gap: 13 (ref 5–15)
BUN: 9 mg/dL (ref 8–23)
CO2: 13 mmol/L — ABNORMAL LOW (ref 22–32)
Calcium: 6.2 mg/dL — CL (ref 8.9–10.3)
Chloride: 104 mmol/L (ref 98–111)
Creatinine, Ser: 0.79 mg/dL (ref 0.44–1.00)
GFR, Estimated: >60 mL/min (ref >60)
Glucose, Bld: 85 mg/dL (ref 70–99)
Potassium: 3.3 mmol/L — ABNORMAL LOW (ref 3.5–5.1)
Sodium: 130 mmol/L — ABNORMAL LOW (ref 135–145)

## 2020-08-28 LAB — CBC
HCT: 40.5 % (ref 36.0–46.0)
Hemoglobin: 14.2 g/dL (ref 12.0–15.0)
MCH: 32.6 pg (ref 26.0–34.0)
MCHC: 35.1 g/dL (ref 30.0–36.0)
MCV: 92.9 fL (ref 80.0–100.0)
Platelets: 398 10*3/uL (ref 150–400)
RBC: 4.36 MIL/uL (ref 3.87–5.11)
RDW: 13.2 % (ref 11.5–15.5)
WBC: 25.3 10*3/uL — ABNORMAL HIGH (ref 4.0–10.5)
nRBC: 0 % (ref 0.0–0.2)

## 2020-08-28 LAB — LACTIC ACID, PLASMA: Lactic Acid, Venous: 2.5 mmol/L (ref 0.5–1.9)

## 2020-08-28 LAB — BLOOD GAS, VENOUS
Acid-base deficit: 10.2 mmol/L — ABNORMAL HIGH (ref 0.0–2.0)
Bicarbonate: 15 mmol/L — ABNORMAL LOW (ref 20.0–28.0)
FIO2: 28
O2 Saturation: 58.4 %
Patient temperature: 36.6
pCO2, Ven: 31.2 mmHg — ABNORMAL LOW (ref 44.0–60.0)
pH, Ven: 7.301 (ref 7.250–7.430)
pO2, Ven: 31.8 mmHg — CL (ref 32.0–45.0)

## 2020-08-28 LAB — CULTURE, BLOOD (ROUTINE X 2): Culture: NO GROWTH

## 2020-08-28 LAB — PHOSPHORUS: Phosphorus: 1.2 mg/dL — ABNORMAL LOW (ref 2.5–4.6)

## 2020-08-28 LAB — MAGNESIUM: Magnesium: 1.6 mg/dL — ABNORMAL LOW (ref 1.7–2.4)

## 2020-08-28 MED ORDER — HYDROMORPHONE HCL 1 MG/ML IJ SOLN
INTRAMUSCULAR | Status: AC
  Filled 2020-08-28: qty 1

## 2020-08-28 MED ORDER — MAGNESIUM SULFATE 2 GM/50ML IV SOLN
2.0000 g | Freq: Once | INTRAVENOUS | Status: AC
  Administered 2020-08-28: 2 g via INTRAVENOUS
  Filled 2020-08-28: qty 50

## 2020-08-28 MED ORDER — CALCIUM GLUCONATE-NACL 1-0.675 GM/50ML-% IV SOLN
1.0000 g | Freq: Once | INTRAVENOUS | Status: AC
  Administered 2020-08-28: 1000 mg via INTRAVENOUS
  Filled 2020-08-28: qty 50

## 2020-08-28 MED ORDER — LACTATED RINGERS IV SOLN: INTRAVENOUS | Status: DC

## 2020-08-28 MED ORDER — HYDROMORPHONE HCL 1 MG/ML IJ SOLN
1.0000 mg | INTRAMUSCULAR | Status: DC | PRN
  Administered 2020-08-28: 1 mg via INTRAVENOUS

## 2020-08-28 MED ORDER — POTASSIUM PHOSPHATES 15 MMOLE/5ML IV SOLN
30.0000 mmol | Freq: Once | INTRAVENOUS | Status: AC
  Administered 2020-08-28: 30 mmol via INTRAVENOUS
  Filled 2020-08-28: qty 10

## 2020-08-28 MED ORDER — NALOXONE HCL 0.4 MG/ML IJ SOLN: 0.4000 mg | INTRAMUSCULAR | Status: DC | PRN

## 2020-08-28 NOTE — Progress Notes (Signed)
Pt started c/o severe pain to her abdomen area that's not relieved by PRN pain meds. On call MD was contacted, new labs and PRN meds ordered. See Mar for details.

## 2020-08-28 NOTE — Plan of Care (Signed)

## 2020-08-28 NOTE — Progress Notes (Signed)
Pt. Has critical values of Calcium 6.2. On call provider notified. New orders in mar.

## 2020-08-28 NOTE — Progress Notes (Signed)
Subjective/Chief Complaint: PT with some abd pain and diarrhea o/n Feels somewhat better   Objective: Vital signs in last 24 hours: Temp:  [97.3 F (36.3 C)-98.6 F (37 C)] 97.3 F (36.3 C) (08/15 0752) Pulse Rate:  [88-100] 96 (08/15 0752) Resp:  [20-29] 24 (08/15 0752) BP: (108-134)/(68-89) 134/83 (08/15 0752) SpO2:  [95 %-99 %] 98 % (08/15 0752) Weight:  [61.8 kg] 61.8 kg (08/15 0500) Last BM Date: 08/18/20  Intake/Output from previous day: 08/14 0701 - 08/15 0700 In: 1033 [I.V.:682.8; IV Piggyback:350.2] Out: 500 [Urine:500] Intake/Output this shift: No intake/output data recorded.  PE:  Constitutional: No acute distress, conversant, appears states age. Eyes: Anicteric sclerae, moist conjunctiva, no lid lag Lungs: Clear to auscultation bilaterally, normal respiratory effort CV: regular rate and rhythm, no murmurs, no peripheral edema, pedal pulses 2+ GI: Soft, no masses or hepatosplenomegaly, tender to palpation LLQ, no rebound/guarding Skin: No rashes, palpation reveals normal turgor Psychiatric: appropriate judgment and insight, oriented to person, place, and time   Lab Results:  Recent Labs    08/26/20 0405 08/27/20 0308  WBC 25.0* 24.3*  HGB 13.7 12.7  HCT 40.1 38.8  PLT 416* 331   BMET Recent Labs    08/27/20 0456 08/28/20 0522  NA 132* 130*  K 3.2* 3.3*  CL 104 104  CO2 13* 13*  GLUCOSE 75 85  BUN 15 9  CREATININE 0.98 0.79  CALCIUM 6.8* 6.2*   PT/INR Recent Labs    08/27/20 0456  LABPROT 16.4*  INR 1.3*   ABG No results for input(s): PHART, HCO3 in the last 72 hours.  Invalid input(s): PCO2, PO2  Studies/Results: CT IMAGE GUIDED DRAINAGE BY PERCUTANEOUS CATHETER  Result Date: 08/27/2020 INDICATION: 70 year old female referred for possible CT-guided drainage of pelvic fluid, in the setting of acute diverticulitis. EXAM: CT GUIDED DRAINAGE OF PELVIC   ABSCESS DISCONTINUATION OF PROCEDURE MEDICATIONS: None ANESTHESIA/SEDATION:  None COMPLICATIONS: None TECHNIQUE: Informed written consent was obtained from the patient after a thorough discussion of the procedural risks, benefits and alternatives. All questions were addressed. Maximal Sterile Barrier Technique was utilized including caps, mask, sterile gowns, sterile gloves, sterile drape, hand hygiene and skin antiseptic. A timeout was performed prior to the initiation of the procedure. PROCEDURE: Patient was position prone position on the CT gantry table for scout CT. Scout CT was performed demonstrating there was a decreasing size of the previous pelvic fluid collection, potentially read distributed given the scant fluid within the dependent pelvis adjacent to the urinary bladder. No target for aspiration/drainage. FINDINGS: No target for attempted drainage. IMPRESSION: Prone position CT for attempt at a transgluteal aspiration/drainage reveals that the fluid collection is improved/read distributed, with no target for safe percutaneous access. Drainage was deferred. Electronically Signed   By: Gilmer Mor D.O.   On: 08/27/2020 12:22    Anti-infectives: Anti-infectives (From admission, onward)    Start     Dose/Rate Route Frequency Ordered Stop   08/27/20 1530  ertapenem (INVANZ) 1,000 mg in sodium chloride 0.9 % 100 mL IVPB        1 g 200 mL/hr over 30 Minutes Intravenous Every 24 hours 08/27/20 1406     08/24/20 1145  ceFEPIme (MAXIPIME) 2 g in sodium chloride 0.9 % 100 mL IVPB  Status:  Discontinued        2 g 200 mL/hr over 30 Minutes Intravenous Every 12 hours 08/24/20 1051 08/27/20 1406   08/23/20 2100  ceFEPIme (MAXIPIME) 2 g in sodium chloride 0.9 %  100 mL IVPB  Status:  Discontinued        2 g 200 mL/hr over 30 Minutes Intravenous Every 24 hours 08/23/20 2051 08/24/20 1051   08/23/20 1700  ciprofloxacin (CIPRO) IVPB 400 mg  Status:  Discontinued        400 mg 200 mL/hr over 60 Minutes Intravenous Every 12 hours 08/23/20 1645 08/23/20 2041   08/23/20 1700   metroNIDAZOLE (FLAGYL) IVPB 500 mg  Status:  Discontinued        500 mg 100 mL/hr over 60 Minutes Intravenous Every 8 hours 08/23/20 1645 08/27/20 1406       Assessment/Plan: Diverticulitis with abscess -IR drain unsuccesful -continue abx for now, CBC pending -lovenox OK -will con't with medical mgmt for now, if does not improve may need Hartman's procedure.  I d/w pt and husband   LOS: 5 days    Axel Filler 08/28/2020

## 2020-08-28 NOTE — Progress Notes (Addendum)
PROGRESS NOTE    Anne Aguirre  BMW:[id number] DOB: 06/05/1950 DOA: 08/23/2020 PCP: Pcp, No    Brief Narrative:  70 y.o. female with past medical history of hypertension presented to the hospital with abdominal pain, nausea, vomiting and cramps.  Patient was constipated prior to presentation.  Patient was unable to tolerate any food.  In the ED, patient was noted to have a diverticulitis with developing abscess.  Patient was then considered for admission to [facility] for further IR intervention and surgical follow-up.    Assessment & Plan:   Principal Problem:   Acute diverticulitis Active Problems:   Essential hypertension, benign   Acute renal failure superimposed on stage 3a chronic kidney disease (HCC)   Hyponatremia   Hyperbilirubinemia   Hypercalcemia  Acute sigmoid diverticulitis, now with perforation and developing abscess with severe sepsis present on admission  Patient was initially on Flagyl and cefepime.  This has been changed to Invanz since yesterday as per surgical recommendation.  Patient went for CT-guided abscess drainage on 08/27/2020 but abscess was so tiny that it could not be drained.  General surgery following the patient.  CBC still pending.  Blood cultures negative in 3 days.  Patient is still on clear liquids.  We will follow general surgery recommendations.  CBC stat.   Acute kidney injury superimposed on CKD IIIa  Improved with IV fluids.  Serum creatinine was 2.04 on admission.  We will continue to monitor closely.  Creatinine of 3.7.  Will discontinue IV fluids.  Encourage oral  Hypokalemia.  We will continue to replenish magnesium and K-Phos.  Hypophosphatemia.  Replenished with K-Phos.  Check levels in a.m.   Hyponatremia  Mild.  We will continue to monitor closely.  Hypocalcemia.   We will repeat calcium gluconate x1 today.  No cramps.   Hyperbilirubinemia  Bilirubin of 2.0 on admission.  Could be secondary to sepsis.  Liver cyst on the CT  scan.  We will continue to monitor in AM.   Essential hypertension  Antihypertensives on hold at this time.   DVT prophylaxis: SCD's  Code Status:  Full code  Family Communication:  I spoke with patient's husband at bedside.    Status is: Inpatient  Remains inpatient appropriate because:Inpatient level of care appropriate due to severity of illness, on IV antibiotics, multiple electrolyte imbalances  Dispo: The patient is from: Home              Anticipated d/c is to: Home              Patient currently is not medically stable to d/c.   Difficult to place patient No   Consultants:  General Surgery IR  Procedures:  None.  Antimicrobials: Anti-infectives (From admission, onward)    Start     Dose/Rate Route Frequency Ordered Stop   08/27/20 1530  ertapenem (INVANZ) 1,000 mg in sodium chloride 0.9 % 100 mL IVPB        1 g 200 mL/hr over 30 Minutes Intravenous Every 24 hours 08/27/20 1406     08/24/20 1145  ceFEPIme (MAXIPIME) 2 g in sodium chloride 0.9 % 100 mL IVPB  Status:  Discontinued        2 g 200 mL/hr over 30 Minutes Intravenous Every 12 hours 08/24/20 1051 08/27/20 1406   08/23/20 2100  ceFEPIme (MAXIPIME) 2 g in sodium chloride 0.9 % 100 mL IVPB  Status:  Discontinued        2 g 200 mL/hr over 30  Minutes Intravenous Every 24 hours 08/23/20 2051 08/24/20 1051   08/23/20 1700  ciprofloxacin (CIPRO) IVPB 400 mg  Status:  Discontinued        400 mg 200 mL/hr over 60 Minutes Intravenous Every 12 hours 08/23/20 1645 08/23/20 2041   08/23/20 1700  metroNIDAZOLE (FLAGYL) IVPB 500 mg  Status:  Discontinued        500 mg 100 mL/hr over 60 Minutes Intravenous Every 8 hours 08/23/20 1645 08/27/20 1406       Subjective:  Patient was seen and examined at bedside.  Complains of mild pain.  No nausea or vomiting.  Has had few episodes of diarrhea.  Complains of mild shortness of breath.    Objective:  Vitals:   08/27/20 2017 08/28/20 0500 08/28/20 0631 08/28/20 0752   BP: 132/79  122/89 134/83  Pulse: 95  95 96  Resp: (!) 25  (!) 26 (!) 24  Temp: 97.9 F (36.6 C)  98 F (36.7 C) (!) 97.3 F (36.3 C)  TempSrc: Oral  Oral Oral  SpO2: 97%  98% 98%  Weight:  61.8 kg    Height:        Intake/Output Summary (Last 24 hours) at 08/28/2020 1110 Last data filed at 08/28/2020 0512 Gross per 24 hour  Intake 1033.04 ml  Output 500 ml  Net 533.04 ml    Filed Weights   08/24/20 0400 08/25/20 0500 08/28/20 0500  Weight: 61.4 kg 61 kg 61.8 kg    Physical examination: General:  Average built, not in obvious distress, on nasal cannula oxygen HENT:   No scleral pallor or icterus noted. Oral mucosa is moist.  Chest:   Diminished breath sounds bilaterally.   CVS: S1 &S2 heard. No murmur.  Regular rate and rhythm. Abdomen: Left lower quadrant tenderness on palpation. Extremities: No cyanosis, clubbing or edema.  Peripheral pulses are palpable. Psych: Alert, awake and oriented, normal mood CNS:  No cranial nerve deficits.  Power equal in all extremities.   Skin: Warm and dry.  No rashes noted.   Data Reviewed: I have personally reviewed following labs and imaging studies  CBC: Recent Labs  Lab 08/23/20 1341 08/24/20 0405 08/25/20 0400 08/26/20 0405 08/27/20 0308  WBC 19.1* 9.7 14.3* 25.0* 24.3*  HGB 15.0 12.6 12.8 13.7 12.7  HCT 42.6 37.7 36.8 40.1 38.8  MCV 94.2 97.9 94.1 95.2 101.6*  PLT 443* 341 323 416* 331    Basic Metabolic Panel: Recent Labs  Lab 08/24/20 0405 08/25/20 0400 08/25/20 1348 08/26/20 0405 08/27/20 0456 08/28/20 0522  NA 135 129* 125* 127* 132* 130*  K 3.2* 2.7* 3.6 3.8 3.2* 3.3*  CL 106 102 98 103 104 104  CO2 21* 19* 18* 15* 13* 13*  GLUCOSE 85 81 87 83 75 85  BUN 30* 22 20 18 15 9   CREATININE 1.42* 1.02* 0.97 0.91 0.98 0.79  CALCIUM 8.7* 7.9* 7.7* 7.3* 6.8* 6.2*  MG 1.4*  --  2.9*  --  1.7 1.6*  PHOS <1.0* 2.2*  --   --  1.1* 1.2*    GFR: Estimated Creatinine Clearance: 63 mL/min (by C-G formula based on  SCr of 0.79 mg/dL). Liver Function Tests: Recent Labs  Lab 08/23/20 1341 08/24/20 0405  AST 15 18  ALT 12 11  ALKPHOS 62 47  BILITOT 2.0* 1.3*  PROT 7.5 5.8*  ALBUMIN 3.4* 2.6*    Recent Labs  Lab 08/23/20 1341  LIPASE 20    No results for input(s): AMMONIA in  the last 168 hours. Coagulation Profile: Recent Labs  Lab 08/27/20 0456  INR 1.3*    Cardiac Enzymes: No results for input(s): CKTOTAL, CKMB, CKMBINDEX, TROPONINI in the last 168 hours. BNP (last 3 results) No results for input(s): PROBNP in the last 8760 hours. HbA1C: No results for input(s): HGBA1C in the last 72 hours. CBG: No results for input(s): GLUCAP in the last 168 hours. Lipid Profile: No results for input(s): CHOL, HDL, LDLCALC, TRIG, CHOLHDL, LDLDIRECT in the last 72 hours. Thyroid Function Tests: No results for input(s): TSH, T4TOTAL, FREET4, T3FREE, THYROIDAB in the last 72 hours. Anemia Panel: No results for input(s): VITAMINB12, FOLATE, FERRITIN, TIBC, IRON, RETICCTPCT in the last 72 hours. Sepsis Labs: Recent Labs  Lab 08/23/20 2215 08/24/20 0405 08/26/20 0935 08/26/20 1226  LATICACIDVEN 2.0* 1.4 1.2 1.4     Recent Results (from the past 240 hour(s))  Resp Panel by RT-PCR (Flu A&B, Covid) Nasopharyngeal Swab     Status: None   Collection Time: 08/23/20  3:35 PM   Specimen: Nasopharyngeal Swab; Nasopharyngeal(NP) swabs in vial transport medium  Result Value Ref Range Status   SARS Coronavirus 2 by RT PCR NEGATIVE NEGATIVE Final    Comment: (NOTE) SARS-CoV-2 target nucleic acids are NOT DETECTED.  The SARS-CoV-2 RNA is generally detectable in upper respiratory specimens during the acute phase of infection. The lowest concentration of SARS-CoV-2 viral copies this assay can detect is 138 copies/mL. A negative result does not preclude SARS-Cov-2 infection and should not be used as the sole basis for treatment or other patient management decisions. A negative result may occur with   improper specimen collection/handling, submission of specimen other than nasopharyngeal swab, presence of viral mutation(s) within the areas targeted by this assay, and inadequate number of viral copies(<138 copies/mL). A negative result must be combined with clinical observations, patient history, and epidemiological information. The expected result is Negative.  Fact Sheet for Patients:  BloggerCourse.com  Fact Sheet for Healthcare Providers:  SeriousBroker.it  This test is no t yet approved or cleared by the [place] FDA and  has been authorized for detection and/or diagnosis of SARS-CoV-2 by FDA under an Emergency Use Authorization (EUA). This EUA will remain  in effect (meaning this test can be used) for the duration of the COVID-19 declaration under Section 564(b)(1) of the Act, 21 U.S.C.section 360bbb-3(b)(1), unless the authorization is terminated  or revoked sooner.       Influenza A by PCR NEGATIVE NEGATIVE Final   Influenza B by PCR NEGATIVE NEGATIVE Final    Comment: (NOTE) The Xpert Xpress SARS-CoV-2/FLU/RSV plus assay is intended as an aid in the diagnosis of influenza from Nasopharyngeal swab specimens and should not be used as a sole basis for treatment. Nasal washings and aspirates are unacceptable for Xpert Xpress SARS-CoV-2/FLU/RSV testing.  Fact Sheet for Patients: BloggerCourse.com  Fact Sheet for Healthcare Providers: SeriousBroker.it  This test is not yet approved or cleared by the [place] FDA and has been authorized for detection and/or diagnosis of SARS-CoV-2 by FDA under an Emergency Use Authorization (EUA). This EUA will remain in effect (meaning this test can be used) for the duration of the COVID-19 declaration under Section 564(b)(1) of the Act, 21 U.S.C. section 360bbb-3(b)(1), unless the authorization is terminated  or revoked.  Performed at [facility], [address], [place], [place] [address]   Culture, blood (routine x 2)     Status: None (Preliminary result)   Collection Time: 08/23/20  8:07 PM  Specimen: BLOOD RIGHT ARM  Result Value Ref Range Status   Specimen Description BLOOD RIGHT ARM  Final   Special Requests   Final    Blood Culture results may not be optimal due to an inadequate volume of blood received in culture bottles BOTTLES DRAWN AEROBIC AND ANAEROBIC   Culture   Final    NO GROWTH 3 DAYS Performed at [facility], [address], [place], [place] [address]    Report Status PENDING  Incomplete  MRSA Next Gen by PCR, Nasal     Status: None   Collection Time: 08/24/20  3:43 AM   Specimen: Nasal Mucosa; Nasal Swab  Result Value Ref Range Status   MRSA by PCR Next Gen NOT DETECTED NOT DETECTED Final    Comment: (NOTE) The GeneXpert MRSA Assay (FDA approved for NASAL specimens only), is one component of a comprehensive MRSA colonization surveillance program. It is not intended to diagnose MRSA infection nor to guide or monitor treatment for MRSA infections. Test performance is not FDA approved in patients less than 11 years old. Performed at [facility], [address] [place], [place] [address]       Radiology Studies: CT IMAGE GUIDED DRAINAGE BY PERCUTANEOUS CATHETER  Result Date: 08/27/2020 INDICATION: 70 year old female referred for possible CT-guided drainage of pelvic fluid, in the setting of acute diverticulitis. EXAM: CT GUIDED DRAINAGE OF PELVIC   ABSCESS DISCONTINUATION OF PROCEDURE MEDICATIONS: None ANESTHESIA/SEDATION: None COMPLICATIONS: None TECHNIQUE: Informed written consent was obtained from the patient after a thorough discussion of the procedural risks, benefits and alternatives. All questions were addressed. Maximal Sterile Barrier Technique was utilized including caps, mask, sterile gowns, sterile gloves, sterile drape, hand hygiene and skin antiseptic. A  timeout was performed prior to the initiation of the procedure. PROCEDURE: Patient was position prone position on the CT gantry table for scout CT. Scout CT was performed demonstrating there was a decreasing size of the previous pelvic fluid collection, potentially read distributed given the scant fluid within the dependent pelvis adjacent to the urinary bladder. No target for aspiration/drainage. FINDINGS: No target for attempted drainage. IMPRESSION: Prone position CT for attempt at a transgluteal aspiration/drainage reveals that the fluid collection is improved/read distributed, with no target for safe percutaneous access. Drainage was deferred. Electronically Signed   By: Gilmer Mor D.O.   On: 08/27/2020 12:22    Scheduled Meds:  Chlorhexidine Gluconate Cloth  6 each Topical Daily   docusate sodium  100 mg Oral BID   pantoprazole (PROTONIX) IV  40 mg Intravenous Q24H   polyethylene glycol  17 g Oral Daily   Continuous Infusions:  ertapenem Stopped (08/27/20 1646)   potassium PHOSPHATE IVPB (in mmol) 30 mmol (08/28/20 0920)   promethazine (PHENERGAN) injection (IM or IVPB)       LOS: 5 days   Joycelyn Das, MD [facility] If 7PM-7AM, please contact night-coverage 08/28/2020, 11:10 AM

## 2020-08-29 ENCOUNTER — Inpatient Hospital Stay (HOSPITAL_COMMUNITY): Payer: Medicare PPO | Admitting: Anesthesiology

## 2020-08-29 ENCOUNTER — Encounter (HOSPITAL_COMMUNITY)
Admission: EM | Disposition: A | Discharge status: Home Health | Payer: Self-pay | Source: Home / Self Care | Attending: Internal Medicine

## 2020-08-29 ENCOUNTER — Encounter (HOSPITAL_COMMUNITY): Payer: Self-pay | Admitting: Family Medicine

## 2020-08-29 DIAGNOSIS — I10 Essential (primary) hypertension: Secondary | ICD-10-CM | POA: Diagnosis not present

## 2020-08-29 DIAGNOSIS — K5792 Diverticulitis of intestine, part unspecified, without perforation or abscess without bleeding: Secondary | ICD-10-CM | POA: Diagnosis not present

## 2020-08-29 DIAGNOSIS — N179 Acute kidney failure, unspecified: Secondary | ICD-10-CM | POA: Diagnosis not present

## 2020-08-29 HISTORY — PX: COLOSTOMY: SHX63

## 2020-08-29 HISTORY — PX: LAPAROTOMY: SHX154

## 2020-08-29 HISTORY — PX: COLON RESECTION: SHX5231

## 2020-08-29 LAB — COMPREHENSIVE METABOLIC PANEL
ALT: 18 U/L (ref 0–44)
AST: 28 U/L (ref 15–41)
Albumin: 2 g/dL — ABNORMAL LOW (ref 3.5–5.0)
Alkaline Phosphatase: 49 U/L (ref 38–126)
Anion gap: 13 (ref 5–15)
BUN: 10 mg/dL (ref 8–23)
CO2: 12 mmol/L — ABNORMAL LOW (ref 22–32)
Calcium: 6.9 mg/dL — ABNORMAL LOW (ref 8.9–10.3)
Chloride: 104 mmol/L (ref 98–111)
Creatinine, Ser: 1.15 mg/dL — ABNORMAL HIGH (ref 0.44–1.00)
GFR, Estimated: 52 mL/min — ABNORMAL LOW (ref >60)
Glucose, Bld: 89 mg/dL (ref 70–99)
Potassium: 3.2 mmol/L — ABNORMAL LOW (ref 3.5–5.1)
Sodium: 129 mmol/L — ABNORMAL LOW (ref 135–145)
Total Bilirubin: 1.9 mg/dL — ABNORMAL HIGH (ref 0.3–1.2)
Total Protein: 5.1 g/dL — ABNORMAL LOW (ref 6.5–8.1)

## 2020-08-29 LAB — MAGNESIUM
Magnesium: 1.5 mg/dL — ABNORMAL LOW (ref 1.7–2.4)
Magnesium: 1.4 mg/dL — ABNORMAL LOW (ref 1.7–2.4)

## 2020-08-29 LAB — BASIC METABOLIC PANEL
Anion gap: 16 — ABNORMAL HIGH (ref 5–15)
BUN: 7 mg/dL — ABNORMAL LOW (ref 8–23)
CO2: 12 mmol/L — ABNORMAL LOW (ref 22–32)
Calcium: 6.7 mg/dL — ABNORMAL LOW (ref 8.9–10.3)
Chloride: 101 mmol/L (ref 98–111)
Creatinine, Ser: 0.83 mg/dL (ref 0.44–1.00)
GFR, Estimated: >60 mL/min (ref >60)
Glucose, Bld: 98 mg/dL (ref 70–99)
Potassium: 3.1 mmol/L — ABNORMAL LOW (ref 3.5–5.1)
Sodium: 129 mmol/L — ABNORMAL LOW (ref 135–145)

## 2020-08-29 LAB — LACTIC ACID, PLASMA: Lactic Acid, Venous: 2.6 mmol/L (ref 0.5–1.9)

## 2020-08-29 LAB — PHOSPHORUS
Phosphorus: 1.7 mg/dL — ABNORMAL LOW (ref 2.5–4.6)
Phosphorus: 2.3 mg/dL — ABNORMAL LOW (ref 2.5–4.6)

## 2020-08-29 LAB — CBC
HCT: 43.7 % (ref 36.0–46.0)
Hemoglobin: 15.5 g/dL — ABNORMAL HIGH (ref 12.0–15.0)
MCH: 31.9 pg (ref 26.0–34.0)
MCHC: 35.5 g/dL (ref 30.0–36.0)
MCV: 89.9 fL (ref 80.0–100.0)
Platelets: 454 10*3/uL — ABNORMAL HIGH (ref 150–400)
RBC: 4.86 MIL/uL (ref 3.87–5.11)
RDW: 13.2 % (ref 11.5–15.5)
WBC: 22.3 10*3/uL — ABNORMAL HIGH (ref 4.0–10.5)
nRBC: 0 % (ref 0.0–0.2)

## 2020-08-29 LAB — TYPE AND SCREEN
ABO/RH(D): O POS
Antibody Screen: NEGATIVE

## 2020-08-29 LAB — ABO/RH: ABO/RH(D): O POS

## 2020-08-29 SURGERY — LAPAROTOMY, EXPLORATORY
Anesthesia: General | Site: Abdomen

## 2020-08-29 MED ORDER — CHLORHEXIDINE GLUCONATE 0.12 % MT SOLN: 15.0000 mL | Freq: Once | OROMUCOSAL | Status: AC

## 2020-08-29 MED ORDER — CHLORHEXIDINE GLUCONATE 0.12 % MT SOLN
OROMUCOSAL | Status: AC
  Administered 2020-08-29: 15 mL via OROMUCOSAL
  Filled 2020-08-29: qty 15

## 2020-08-29 MED ORDER — ALBUMIN HUMAN 5 % IV SOLN: INTRAVENOUS | Status: DC | PRN

## 2020-08-29 MED ORDER — PHENYLEPHRINE 40 MCG/ML (10ML) SYRINGE FOR IV PUSH (FOR BLOOD PRESSURE SUPPORT)
PREFILLED_SYRINGE | INTRAVENOUS | Status: AC
  Filled 2020-08-29: qty 10

## 2020-08-29 MED ORDER — METHOCARBAMOL 1000 MG/10ML IJ SOLN
1000.0000 mg | Freq: Three times a day (TID) | INTRAVENOUS | Status: DC
  Administered 2020-08-29 – 2020-09-03 (×13): 1000 mg via INTRAVENOUS
  Filled 2020-08-29 (×3): qty 1000
  Filled 2020-08-29 (×2): qty 10
  Filled 2020-08-29: qty 1000
  Filled 2020-08-29 (×7): qty 10
  Filled 2020-08-29: qty 1000
  Filled 2020-08-29 (×2): qty 10
  Filled 2020-08-29: qty 1000

## 2020-08-29 MED ORDER — PHENYLEPHRINE HCL-NACL 20-0.9 MG/250ML-% IV SOLN
INTRAVENOUS | Status: DC | PRN
  Administered 2020-08-29: 30 ug/min via INTRAVENOUS

## 2020-08-29 MED ORDER — ONDANSETRON HCL 4 MG/2ML IJ SOLN
INTRAMUSCULAR | Status: AC
  Filled 2020-08-29: qty 2

## 2020-08-29 MED ORDER — FENTANYL CITRATE (PF) 100 MCG/2ML IJ SOLN: 25.0000 ug | INTRAMUSCULAR | Status: DC | PRN

## 2020-08-29 MED ORDER — SUGAMMADEX SODIUM 200 MG/2ML IV SOLN
INTRAVENOUS | Status: DC | PRN
  Administered 2020-08-29: 300 mg via INTRAVENOUS

## 2020-08-29 MED ORDER — ROCURONIUM BROMIDE 10 MG/ML (PF) SYRINGE
PREFILLED_SYRINGE | INTRAVENOUS | Status: AC
  Filled 2020-08-29: qty 10

## 2020-08-29 MED ORDER — ACETAMINOPHEN 10 MG/ML IV SOLN
INTRAVENOUS | Status: AC
  Filled 2020-08-29: qty 100

## 2020-08-29 MED ORDER — ALBUMIN HUMAN 5 % IV SOLN
12.5000 g | Freq: Once | INTRAVENOUS | Status: AC
  Administered 2020-08-29: 12.5 g via INTRAVENOUS

## 2020-08-29 MED ORDER — SIMETHICONE 80 MG PO CHEW
80.0000 mg | CHEWABLE_TABLET | Freq: Four times a day (QID) | ORAL | Status: AC | PRN
  Administered 2020-08-29: 80 mg via ORAL
  Filled 2020-08-29 (×2): qty 1

## 2020-08-29 MED ORDER — OXYCODONE HCL 5 MG/5ML PO SOLN: 5.0000 mg | Freq: Once | ORAL | Status: DC | PRN

## 2020-08-29 MED ORDER — MORPHINE SULFATE (PF) 2 MG/ML IV SOLN
2.0000 mg | INTRAVENOUS | Status: DC | PRN
  Administered 2020-08-29 – 2020-08-31 (×3): 2 mg via INTRAVENOUS
  Filled 2020-08-29 (×3): qty 1

## 2020-08-29 MED ORDER — MAGNESIUM SULFATE 2 GM/50ML IV SOLN
2.0000 g | Freq: Once | INTRAVENOUS | Status: AC
  Administered 2020-08-29: 2 g via INTRAVENOUS
  Filled 2020-08-29: qty 50

## 2020-08-29 MED ORDER — ALBUMIN HUMAN 5 % IV SOLN
INTRAVENOUS | Status: AC
  Filled 2020-08-29: qty 250

## 2020-08-29 MED ORDER — ONDANSETRON HCL 4 MG/2ML IJ SOLN: 4.0000 mg | Freq: Once | INTRAMUSCULAR | Status: DC | PRN

## 2020-08-29 MED ORDER — LIDOCAINE 2% (20 MG/ML) 5 ML SYRINGE
INTRAMUSCULAR | Status: AC
  Filled 2020-08-29: qty 5

## 2020-08-29 MED ORDER — 0.9 % SODIUM CHLORIDE (POUR BTL) OPTIME
TOPICAL | Status: DC | PRN
  Administered 2020-08-29: 3000 mL
  Administered 2020-08-29: 2000 mL

## 2020-08-29 MED ORDER — PHENYLEPHRINE 40 MCG/ML (10ML) SYRINGE FOR IV PUSH (FOR BLOOD PRESSURE SUPPORT)
PREFILLED_SYRINGE | INTRAVENOUS | Status: DC | PRN
  Administered 2020-08-29: 160 ug via INTRAVENOUS
  Administered 2020-08-29 (×2): 120 ug via INTRAVENOUS

## 2020-08-29 MED ORDER — CALCIUM GLUCONATE-NACL 1-0.675 GM/50ML-% IV SOLN
1.0000 g | Freq: Once | INTRAVENOUS | Status: AC
  Administered 2020-08-29: 1000 mg via INTRAVENOUS
  Filled 2020-08-29 (×2): qty 50

## 2020-08-29 MED ORDER — ACETAMINOPHEN 10 MG/ML IV SOLN
INTRAVENOUS | Status: DC | PRN
  Administered 2020-08-29: 1000 mg via INTRAVENOUS

## 2020-08-29 MED ORDER — MIDAZOLAM HCL 5 MG/5ML IJ SOLN
INTRAMUSCULAR | Status: DC | PRN
  Administered 2020-08-29 (×2): 1 mg via INTRAVENOUS

## 2020-08-29 MED ORDER — ACETAMINOPHEN 10 MG/ML IV SOLN
1000.0000 mg | Freq: Four times a day (QID) | INTRAVENOUS | Status: AC
  Administered 2020-08-29 – 2020-08-30 (×4): 1000 mg via INTRAVENOUS
  Filled 2020-08-29 (×4): qty 100

## 2020-08-29 MED ORDER — LIDOCAINE 2% (20 MG/ML) 5 ML SYRINGE
INTRAMUSCULAR | Status: DC | PRN
  Administered 2020-08-29: 80 mg via INTRAVENOUS

## 2020-08-29 MED ORDER — DEXAMETHASONE SODIUM PHOSPHATE 10 MG/ML IJ SOLN
INTRAMUSCULAR | Status: DC | PRN
  Administered 2020-08-29: 5 mg via INTRAVENOUS

## 2020-08-29 MED ORDER — MIDAZOLAM HCL 2 MG/2ML IJ SOLN
INTRAMUSCULAR | Status: AC
  Filled 2020-08-29: qty 2

## 2020-08-29 MED ORDER — LACTATED RINGERS IV SOLN: INTRAVENOUS | Status: DC

## 2020-08-29 MED ORDER — FENTANYL CITRATE (PF) 250 MCG/5ML IJ SOLN
INTRAMUSCULAR | Status: AC
  Filled 2020-08-29: qty 5

## 2020-08-29 MED ORDER — SODIUM CHLORIDE 0.9% FLUSH
10.0000 mL | Freq: Two times a day (BID) | INTRAVENOUS | Status: DC
  Administered 2020-08-30 – 2020-08-31 (×3): 10 mL
  Administered 2020-08-31: 30 mL
  Administered 2020-09-01: 10 mL
  Administered 2020-09-01: 30 mL
  Administered 2020-09-02: 20 mL

## 2020-08-29 MED ORDER — PROPOFOL 10 MG/ML IV BOLUS
INTRAVENOUS | Status: AC
  Filled 2020-08-29: qty 20

## 2020-08-29 MED ORDER — HYDROMORPHONE HCL 1 MG/ML IJ SOLN: 0.2500 mg | INTRAMUSCULAR | Status: DC | PRN

## 2020-08-29 MED ORDER — FENTANYL CITRATE (PF) 100 MCG/2ML IJ SOLN
INTRAMUSCULAR | Status: DC | PRN
  Administered 2020-08-29: 25 ug via INTRAVENOUS
  Administered 2020-08-29 (×2): 50 ug via INTRAVENOUS

## 2020-08-29 MED ORDER — ORAL CARE MOUTH RINSE: 15.0000 mL | Freq: Once | OROMUCOSAL | Status: AC

## 2020-08-29 MED ORDER — OXYCODONE HCL 5 MG PO TABS: 5.0000 mg | ORAL_TABLET | Freq: Once | ORAL | Status: DC | PRN

## 2020-08-29 MED ORDER — ACETAMINOPHEN 10 MG/ML IV SOLN: 1000.0000 mg | Freq: Once | INTRAVENOUS | Status: DC | PRN

## 2020-08-29 MED ORDER — POTASSIUM PHOSPHATES 15 MMOLE/5ML IV SOLN
30.0000 mmol | Freq: Once | INTRAVENOUS | Status: AC
  Administered 2020-08-29: 30 mmol via INTRAVENOUS
  Filled 2020-08-29: qty 10

## 2020-08-29 MED ORDER — SODIUM CHLORIDE 0.9% FLUSH: 10.0000 mL | INTRAVENOUS | Status: DC | PRN

## 2020-08-29 MED ORDER — PROPOFOL 10 MG/ML IV BOLUS
INTRAVENOUS | Status: DC | PRN
  Administered 2020-08-29: 90 mg via INTRAVENOUS

## 2020-08-29 SURGICAL SUPPLY — 64 items
APL PRP STRL LF DISP 70% ISPRP (MISCELLANEOUS) ×2
BAG COUNTER SPONGE SURGICOUNT (BAG) ×3 IMPLANT
BAG SPNG CNTER NS LX DISP (BAG) ×2
BIOPATCH RED 1 DISK 7.0 (GAUZE/BANDAGES/DRESSINGS) ×1 IMPLANT
BLADE CLIPPER SURG (BLADE) IMPLANT
BNDG GAUZE ELAST 4 BULKY (GAUZE/BANDAGES/DRESSINGS) ×1 IMPLANT
CANISTER SUCT 3000ML PPV (MISCELLANEOUS) ×3 IMPLANT
CHLORAPREP W/TINT 26 (MISCELLANEOUS) ×3 IMPLANT
COVER SURGICAL LIGHT HANDLE (MISCELLANEOUS) ×6 IMPLANT
DRAIN CHANNEL 19F RND (DRAIN) ×1 IMPLANT
DRAPE LAPAROSCOPIC ABDOMINAL (DRAPES) ×3 IMPLANT
DRAPE WARM FLUID 44X44 (DRAPES) ×3 IMPLANT
DRSG OPSITE POSTOP 4X10 (GAUZE/BANDAGES/DRESSINGS) IMPLANT
DRSG OPSITE POSTOP 4X8 (GAUZE/BANDAGES/DRESSINGS) IMPLANT
DRSG PAD ABDOMINAL 8X10 ST (GAUZE/BANDAGES/DRESSINGS) ×1 IMPLANT
DRSG TEGADERM 4X4.75 (GAUZE/BANDAGES/DRESSINGS) ×1 IMPLANT
ELECT BLADE 6.5 EXT (BLADE) IMPLANT
ELECT CAUTERY BLADE 6.4 (BLADE) ×3 IMPLANT
ELECT REM PT RETURN 9FT ADLT (ELECTROSURGICAL) ×3
ELECTRODE REM PT RTRN 9FT ADLT (ELECTROSURGICAL) ×2 IMPLANT
EVACUATOR SILICONE 100CC (DRAIN) ×1 IMPLANT
GLOVE SRG 8 PF TXTR STRL LF DI (GLOVE) ×4 IMPLANT
GLOVE SURG ENC MOIS LTX SZ7.5 (GLOVE) ×6 IMPLANT
GLOVE SURG UNDER POLY LF SZ8 (GLOVE) ×6
GOWN STRL REUS W/ TWL LRG LVL3 (GOWN DISPOSABLE) ×8 IMPLANT
GOWN STRL REUS W/ TWL XL LVL3 (GOWN DISPOSABLE) ×4 IMPLANT
GOWN STRL REUS W/TWL LRG LVL3 (GOWN DISPOSABLE) ×12
GOWN STRL REUS W/TWL XL LVL3 (GOWN DISPOSABLE) ×6
HANDLE SUCTION POOLE (INSTRUMENTS) ×2 IMPLANT
KIT BASIN OR (CUSTOM PROCEDURE TRAY) ×3 IMPLANT
KIT OSTOMY DRAINABLE 2.75 STR (WOUND CARE) ×1 IMPLANT
KIT SIGMOIDOSCOPE (SET/KITS/TRAYS/PACK) IMPLANT
KIT TURNOVER KIT B (KITS) ×3 IMPLANT
LIGASURE IMPACT 36 18CM CVD LR (INSTRUMENTS) IMPLANT
NS IRRIG 1000ML POUR BTL (IV SOLUTION) ×6 IMPLANT
PACK COLON (CUSTOM PROCEDURE TRAY) ×3 IMPLANT
PACK GENERAL/GYN (CUSTOM PROCEDURE TRAY) ×3 IMPLANT
PAD ARMBOARD 7.5X6 YLW CONV (MISCELLANEOUS) ×3 IMPLANT
PENCIL SMOKE EVACUATOR (MISCELLANEOUS) ×3 IMPLANT
RELOAD PROXIMATE 75MM BLUE (ENDOMECHANICALS) ×3 IMPLANT
RELOAD STAPLE 75 3.8 BLU REG (ENDOMECHANICALS) IMPLANT
SEALER TISSUE X1 CVD JAW (INSTRUMENTS) ×1 IMPLANT
SPECIMEN JAR LARGE (MISCELLANEOUS) IMPLANT
SPONGE T-LAP 18X18 ~~LOC~~+RFID (SPONGE) ×2 IMPLANT
STAPLER CVD CUT BL 40 RELOAD (ENDOMECHANICALS) ×3 IMPLANT
STAPLER CVD CUT BLU 40 RELOAD (ENDOMECHANICALS) IMPLANT
STAPLER PROXIMATE 75MM BLUE (STAPLE) ×1 IMPLANT
STAPLER VISISTAT 35W (STAPLE) ×3 IMPLANT
SUCTION POOLE HANDLE (INSTRUMENTS) ×3
SURGILUBE 2OZ TUBE FLIPTOP (MISCELLANEOUS) IMPLANT
SUT ETHILON 2 0 FS 18 (SUTURE) ×1 IMPLANT
SUT PDS AB 1 TP1 54 (SUTURE) ×6 IMPLANT
SUT PDS AB 1 TP1 96 (SUTURE) ×6 IMPLANT
SUT PROLENE 2 0 CT2 30 (SUTURE) ×1 IMPLANT
SUT PROLENE 2 0 KS (SUTURE) IMPLANT
SUT SILK 2 0 SH CR/8 (SUTURE) ×3 IMPLANT
SUT SILK 2 0 TIES 10X30 (SUTURE) ×3 IMPLANT
SUT SILK 3 0 SH CR/8 (SUTURE) ×3 IMPLANT
SUT SILK 3 0 TIES 10X30 (SUTURE) ×3 IMPLANT
SUT VIC AB 3-0 SH 18 (SUTURE) IMPLANT
TOWEL GREEN STERILE (TOWEL DISPOSABLE) ×3 IMPLANT
TRAY FOLEY MTR SLVR 16FR STAT (SET/KITS/TRAYS/PACK) ×3 IMPLANT
TUBE CONNECTING 12X1/4 (SUCTIONS) ×6 IMPLANT
YANKAUER SUCT BULB TIP NO VENT (SUCTIONS) IMPLANT

## 2020-08-29 NOTE — Op Note (Addendum)
08/29/2020  2:31 PM  PATIENT:  Anne Aguirre  70 y.o. female  PRE-OPERATIVE DIAGNOSIS:  Diverticulitis with abscess  POST-OPERATIVE DIAGNOSIS: Perforated diverticulitis with feculent peritonitis   PROCEDURE:  Procedure(s): EXPLORATORY LAPAROTOMY (N/A) Splenic flexure mobilization COLON RESECTION (N/A) COLOSTOMY (N/A)  SURGEON:  Surgeon(s) and Role:    Axel Filler, MD - Primary  PHYSICIAN ASSISTANT: Leary Roca, PA-C  ANESTHESIA:   local and general  EBL:  100 mL   BLOOD ADMINISTERED:none  DRAINS: (19Fr) Jackson-Pratt drain(s) with closed bulb suction in the pelvis   LOCAL MEDICATIONS USED:  NONE  SPECIMEN:  Source of Specimen: Sigmoid colon with perforation  DISPOSITION OF SPECIMEN:  PATHOLOGY  COUNTS:  YES  TOURNIQUET:  * No tourniquets in log *  DICTATION: .Dragon Dictation Indication procedure: Patient is a 70 year old female, who arrives secondary to perforated diverticulitis and was admitted from the ER.  Patient was initially treated nonoperatively with antibiotics.  Patient had continued abdominal pain, leukocytosis, and was taken to the operating urgently for exploratory laparotomy.  Findings: Patient had a perforated sigmoid colon.  There is significant mount of feculent peritonitis within the abdomen as well as purulence.  The splenic flexure was mobilized.  A left descending colostomy was brought up to the abdominal wall and matured.  2-0 Prolene's were used to mark the staple line at the rectal stump.  This was taken down just past the sacral prominence.   Details of procedure: After the patient was consented she was taken back to the OR and placed in supine position with bilateral SCDs in place.  She underwent general endotracheal intubation.  Foley cath was placed.  Patient was then prepped and draped standard fashion.  A timeout was called and all facts verified.  10.  Blade was used to make a lower midline incision.  Cautery was used to  maintain hemostasis and dissection taken down to the linea alba.  This was incised.  The peritoneum was entered bluntly.  Large amount of purulent peritonitis that was expressed.  This was suctioned out.  The fascia was extended to the length of skin incision.  At this time a abdominal wall retractor was placed.  The purulent peritonitis was evacuated out with suction and laparotomy pads.  After mobilizing the sigmoid colon significant mount of feculent peritonitis was encountered.  This appeared to be coming from the abscess portion of the sigmoid colon.  At this time the white line of Toldt was incised laterally.  This allowed me to medialize the colon.  The left ureter was visualized.  At this time a 75 GIA stapler was used to transect the left colon proximal to the the diseased tissue.  At this time a ligation device was then used to ligate the mesentery and I continue to work inferiorly.  I continued dissection just past the sacral prominence.  At this time a blue load contour stapler was used to transect the rectal stump.  The specimen was sent off to pathology.  2-0 Prolene's were then used to mark the staple line of the rectal stump.  At this time the white line of Toldt was extended up to the splenic flexure.  The splenic flexure was taken down to help with reach of the ostomy.  At this time the abdominal cavity was irrigated out with multiple liters sterile saline.  The ostomy site that was previously marked was excised.  The anterior and posterior fascia's were incised in a cruciate fashion.  This allowed 2 fingerbreadths to  come through the ostomy site.  At this time the descending colostomy was brought up through the ostomy site.  At this time a 51 [place] JP drain was placed into the pelvis brought out the right lower quadrant.  A 3-0 nylon was used to fixate the drain to the abdominal wall.  At this time the omentum was brought over the midline and tucked down into the pelvis.  #1 PDS stitch was used  for reapproximate the fascia x2 in the usual standard fashion.  At this time midline subcutaneous tissue was irrigated out with sterile saline.  This was packed with saline soaked gauze.  At this time the colostomy was matured using 3-0 Vicryl sutures in interrupted fashion.  Ostomy appliance was applied.  Patient taught the procedure well was taken to the recovery in stable condition.  Upon entering the abdomen (organ space), I encountered purulence in the abdominal cavity and feculent peritonitis .  CASE DATA:  Type of patient?: DOW CASE (Surgical Hospitalist [facility] Inpatient)  Status of Case? EMERGENT Add On  Infection Present At Time Of Surgery (PATOS)?   yes           PLAN OF CARE: Admit to inpatient   PATIENT DISPOSITION:  PACU - hemodynamically stable.   Delay start of Pharmacological VTE agent (>24hrs) due to surgical blood loss or risk of bleeding: yes

## 2020-08-29 NOTE — Transfer of Care (Signed)
Immediate Anesthesia Transfer of Care Note  Patient: Anne Aguirre  Procedure(s) Performed: EXPLORATORY LAPAROTOMY (Abdomen) COLON RESECTION COLOSTOMY  Patient Location: PACU  Anesthesia Type:General  Level of Consciousness: drowsy, patient cooperative and responds to stimulation  Airway & Oxygen Therapy: Patient Spontanous Breathing and Patient connected to face mask oxygen  Post-op Assessment: Report given to RN, Post -op Vital signs reviewed and stable and Patient moving all extremities X 4  Post vital signs: Reviewed and stable  Last Vitals:  Vitals Value Taken Time  BP 111/59 08/29/20 1454  Temp    Pulse 91 08/29/20 1459  Resp 24 08/29/20 1459  SpO2 91 % 08/29/20 1459  Vitals shown include unvalidated device data.  Last Pain:  Vitals:   08/29/20 1244  TempSrc:   PainSc: 3       Patients Stated Pain Goal: 0 (08/29/20 0117)  Complications: No notable events documented.

## 2020-08-29 NOTE — Anesthesia Procedure Notes (Signed)
Procedure Name: Intubation Date/Time: 08/29/2020 1:26 PM Performed by: Elenore Paddy, CRNA Pre-anesthesia Checklist: Patient identified, Emergency Drugs available, Suction available and Patient being monitored Patient Re-evaluated:Patient Re-evaluated prior to induction Oxygen Delivery Method: Circle system utilized Preoxygenation: Pre-oxygenation with 100% oxygen Induction Type: IV induction Ventilation: Mask ventilation without difficulty Laryngoscope Size: Mac and 3 Grade View: Grade I Tube type: Oral Tube size: 7.0 mm Number of attempts: 1 Airway Equipment and Method: Stylet Placement Confirmation: ETT inserted through vocal cords under direct vision, positive ETCO2 and breath sounds checked- equal and bilateral Secured at: 22 cm Tube secured with: Tape Dental Injury: Teeth and Oropharynx as per pre-operative assessment  Comments: Intubated by Everlene Other SRNA

## 2020-08-29 NOTE — Anesthesia Postprocedure Evaluation (Signed)
Anesthesia Post Note  Patient: Anne Aguirre  Procedure(s) Performed: EXPLORATORY LAPAROTOMY (Abdomen) COLON RESECTION COLOSTOMY     Patient location during evaluation: PACU Anesthesia Type: General Level of consciousness: awake and alert Pain management: pain level controlled Vital Signs Assessment: post-procedure vital signs reviewed and stable Respiratory status: spontaneous breathing, nonlabored ventilation, respiratory function stable and patient connected to nasal cannula oxygen Cardiovascular status: blood pressure returned to baseline and stable Postop Assessment: no apparent nausea or vomiting Anesthetic complications: no   No notable events documented.  Last Vitals:  Vitals:   08/29/20 1543 08/29/20 1610  BP: 103/69 109/61  Pulse: 73 80  Resp: 16 20  Temp: 36.7 C 36.6 C  SpO2: 92% 95%    Last Pain:  Vitals:   08/29/20 1610  TempSrc: Oral  PainSc: 7                  Cecile Hearing

## 2020-08-29 NOTE — Progress Notes (Signed)
Subjective/Chief Complaint: PT with worse pain overnight    Objective: Vital signs in last 24 hours: Temp:  [97.1 F (36.2 C)-97.8 F (36.6 C)] 97.5 F (36.4 C) (08/16 0700) Pulse Rate:  [89-137] 114 (08/16 0700) Resp:  [22-35] 26 (08/16 0700) BP: (85-146)/(67-115) 104/78 (08/16 0700) SpO2:  [94 %-96 %] 95 % (08/16 0700) Weight:  [72.1 kg] 72.1 kg (08/16 0421) Last BM Date: 08/28/20  Intake/Output from previous day: 08/15 0701 - 08/16 0700 In: 722.6 [I.V.:212.4; IV Piggyback:510.2] Out: 500 [Urine:500] Intake/Output this shift: No intake/output data recorded.  PE:  Constitutional: No acute distress, conversant, appears states age. Eyes: Anicteric sclerae, moist conjunctiva, no lid lag Lungs: Clear to auscultation bilaterally, normal respiratory effort CV: regular rate and rhythm, no murmurs, no peripheral edema, pedal pulses 2+ GI: Soft, no masses or hepatosplenomegaly, tender to palpation with some rebound Skin: No rashes, palpation reveals normal turgor Psychiatric: appropriate judgment and insight, oriented to person, place, and time    Lab Results:  Recent Labs    08/28/20 1103 08/29/20 0406  WBC 25.3* 22.3*  HGB 14.2 15.5*  HCT 40.5 43.7  PLT 398 454*   BMET Recent Labs    08/28/20 2200 08/29/20 0406  NA 129* 129*  K 3.1* 3.2*  CL 101 104  CO2 12* 12*  GLUCOSE 98 89  BUN 7* 10  CREATININE 0.83 1.15*  CALCIUM 6.7* 6.9*   PT/INR Recent Labs    08/27/20 0456  LABPROT 16.4*  INR 1.3*   ABG Recent Labs    08/28/20 2158  HCO3 15.0*    Studies/Results: CT IMAGE GUIDED DRAINAGE BY PERCUTANEOUS CATHETER  Result Date: 08/27/2020 INDICATION: 70 year old female referred for possible CT-guided drainage of pelvic fluid, in the setting of acute diverticulitis. EXAM: CT GUIDED DRAINAGE OF PELVIC   ABSCESS DISCONTINUATION OF PROCEDURE MEDICATIONS: None ANESTHESIA/SEDATION: None COMPLICATIONS: None TECHNIQUE: Informed written consent was obtained  from the patient after a thorough discussion of the procedural risks, benefits and alternatives. All questions were addressed. Maximal Sterile Barrier Technique was utilized including caps, mask, sterile gowns, sterile gloves, sterile drape, hand hygiene and skin antiseptic. A timeout was performed prior to the initiation of the procedure. PROCEDURE: Patient was position prone position on the CT gantry table for scout CT. Scout CT was performed demonstrating there was a decreasing size of the previous pelvic fluid collection, potentially read distributed given the scant fluid within the dependent pelvis adjacent to the urinary bladder. No target for aspiration/drainage. FINDINGS: No target for attempted drainage. IMPRESSION: Prone position CT for attempt at a transgluteal aspiration/drainage reveals that the fluid collection is improved/read distributed, with no target for safe percutaneous access. Drainage was deferred. Electronically Signed   By: Gilmer Mor D.O.   On: 08/27/2020 12:22    Anti-infectives: Anti-infectives (From admission, onward)    Start     Dose/Rate Route Frequency Ordered Stop   08/27/20 1530  ertapenem (INVANZ) 1,000 mg in sodium chloride 0.9 % 100 mL IVPB        1 g 200 mL/hr over 30 Minutes Intravenous Every 24 hours 08/27/20 1406     08/24/20 1145  ceFEPIme (MAXIPIME) 2 g in sodium chloride 0.9 % 100 mL IVPB  Status:  Discontinued        2 g 200 mL/hr over 30 Minutes Intravenous Every 12 hours 08/24/20 1051 08/27/20 1406   08/23/20 2100  ceFEPIme (MAXIPIME) 2 g in sodium chloride 0.9 % 100 mL IVPB  Status:  Discontinued  2 g 200 mL/hr over 30 Minutes Intravenous Every 24 hours 08/23/20 2051 08/24/20 1051   08/23/20 1700  ciprofloxacin (CIPRO) IVPB 400 mg  Status:  Discontinued        400 mg 200 mL/hr over 60 Minutes Intravenous Every 12 hours 08/23/20 1645 08/23/20 2041   08/23/20 1700  metroNIDAZOLE (FLAGYL) IVPB 500 mg  Status:  Discontinued        500 mg 100  mL/hr over 60 Minutes Intravenous Every 8 hours 08/23/20 1645 08/27/20 1406       Assessment/Plan: Diverticulitis with abscess -pain worse this AM Will plan on OR today for colon resection and ostomy I d/w the patient and her husband the risks and benefits of the procedure to include but not limited to: infection, bleeding, damage to surrounding surgery, and possible need for further surgery.  Patient voiced understanding and wishes to proceed.   LOS: 6 days    Axel Filler 08/29/2020

## 2020-08-29 NOTE — Consult Note (Signed)
WOC Nurse requested for preoperative stoma site marking  Discussed surgical procedure and stoma creation with patient and family.  Explained role of the WOC nurse team.  Provided the patient with educational booklet and provided samples of pouching options.  Answered patient and family questions.   Examined patient lying, sitting, and standing in order to place the marking in the patient's visual field, away from any creases or abdominal contour issues and within the rectus muscle.  Attempted to mark below the patient's belt line.   Marked for colostomy in the LLQ  __4__ cm to the left of the umbilicus and _2___cm above the umbilicus. Patient's abdomen cleansed with CHG wipes at site markings, allowed to air dry prior to marking.Covered mark with thin film transparent dressing to preserve mark until date of surgery.   WOC Nurse team will follow up with patient after surgery for continue ostomy care and teaching.  Brevan Luberto [facility] MSN, RN,CWOCN, CNS, [employer] [phone]

## 2020-08-29 NOTE — Progress Notes (Signed)
Noted order, for nasal gastric tube to low intermittent suction. Paged doctor to confirm he wanted this placed, MD said if not placed in surgery no need to place it and ok to d/c order

## 2020-08-29 NOTE — Anesthesia Preprocedure Evaluation (Addendum)
Anesthesia Evaluation  Patient identified by MRN, date of birth, ID band Patient awake    Reviewed: Allergy & Precautions, NPO status , Patient's Chart, lab work & pertinent test results  History of Anesthesia Complications Negative for: history of anesthetic complications  Airway Mallampati: II  TM Distance: >3 FB Neck ROM: Full    Dental no notable dental hx.    Pulmonary neg pulmonary ROS,    Pulmonary exam normal breath sounds clear to auscultation       Cardiovascular hypertension, Pt. on medications and Pt. on home beta blockers Normal cardiovascular exam Rhythm:Regular Rate:Normal     Neuro/Psych negative neurological ROS  negative psych ROS   GI/Hepatic negative GI ROS, Neg liver ROS,   Endo/Other  negative endocrine ROS Na 129 K 3.2 Ca 6.9 Mg 1.4 P 2.3   Renal/GU Renal InsufficiencyRenal disease  negative genitourinary   Musculoskeletal negative musculoskeletal ROS (+) Arthritis , Osteoarthritis,    Abdominal   Peds negative pediatric ROS (+)  Hematology negative hematology ROS (+)   Anesthesia Other Findings   Reproductive/Obstetrics negative OB ROS                            Anesthesia Physical Anesthesia Plan  ASA: 2  Anesthesia Plan: General   Post-op Pain Management:    Induction: Intravenous  PONV Risk Score and Plan: 4 or greater and Treatment may vary due to age or medical condition, Ondansetron, Dexamethasone and Propofol infusion  Airway Management Planned: Oral ETT  Additional Equipment: None  Intra-op Plan:   Post-operative Plan: Extubation in OR  Informed Consent: I have reviewed the patients History and Physical, chart, labs and discussed the procedure including the risks, benefits and alternatives for the proposed anesthesia with the patient or authorized representative who has indicated his/her understanding and acceptance.     Dental  advisory given  Plan Discussed with: CRNA and Surgeon  Anesthesia Plan Comments:        Anesthesia Quick Evaluation

## 2020-08-29 NOTE — Progress Notes (Signed)
Notified MD of need for transport for surgery. MD said ok for pt to travel without cardiac monitor.

## 2020-08-29 NOTE — Progress Notes (Signed)
PROGRESS NOTE   Anne Aguirre  ZOX:[id number] DOB: 07/26/1950 DOA: 08/23/2020 PCP: Pcp, No    Brief Narrative:  70 y.o. female with past medical history of hypertension presented to the hospital with abdominal pain, nausea, vomiting and cramps.  Patient was constipated prior to presentation.  Patient was unable to tolerate any food.  In the ED, patient was noted to have a diverticulitis with developing abscess.  [facility] then [facility] intervention and surgical follow-up.    During hospitalization, patient was seen by interventional radiology but there was no drainable abscess so he was treated conservatively with Invanz and general surgery follow-up.  Patient however persisted to have increasing abdominal pain persistent leukocytosis and elevated lactate so surgery has decided the patient for surgical intervention on 08/29/2020  Assessment & Plan:   Principal Problem:   Acute diverticulitis Active Problems:   Essential hypertension, benign   Acute renal failure superimposed on stage 3a chronic kidney disease (HCC)   Hyponatremia   Hyperbilirubinemia   Hypercalcemia  Acute sigmoid diverticulitis,  with perforation and developing abscess with severe sepsis present on admission  Patient is with worsening abdominal symptoms.  Was unable to be drained by IR.  Since clinically not improving general surgery has decided for operative intervention today colon resection and ostomy..  Currently on Invanz.  Blood cultures negative so far but persistent leukocytosis and elevated lactate.  Patient is very tender on palpation of the abdomen.   Acute kidney injury superimposed on CKD IIIa  Serum creatinine was 2.04 on admission.  Continue IV fluid hydration.  Latest creatinine of 1.1  Hypokalemia.  Continue K-Phos  Hypophosphatemia.   Continue K-Phos.  Levels in AM.   Hyponatremia  .  Continue to monitor closely.  Continue IV fluid  hydration.  Hypocalcemia.   Will repeat calcium gluconate as well.   Hyperbilirubinemia  Bilirubin of 2.0 on admission.  Could be secondary to sepsis.  Liver cyst on the CT scan.  Bilirubin on repeat was 1.9.   Essential hypertension  Antihypertensives on hold at this time.  Patient is on amlodipine, Avapro and nadolol at home.  Current blood pressure is stable.   DVT prophylaxis: SCD's  Code Status:  Full code  Family Communication:  I again spoke with the patient's husband at bedside.  Status is: Inpatient  Remains inpatient appropriate because:Inpatient level of care appropriate due to severity of illness, on IV antibiotics, multiple electrolyte imbalances, worsening abdominal pain necessitating surgical intervention  Dispo: The patient is from: Home              Anticipated d/c is to: Home              Patient currently is not medically stable to d/c.   Difficult to place patient No   Consultants:  General Surgery IR  Procedures:  None so far.  Antimicrobials: Ertapenem  Subjective:  Today, patient was seen and examined at bedside.  States that her pain has gotten worse.  Feels short winded and dyspneic.  Objective:  Vitals:   08/29/20 0421 08/29/20 0700 08/29/20 1104 08/29/20 1227  BP:  104/78 96/68 110/84  Pulse:  (!) 114 (!) 109 (!) 110  Resp:  (!) 26 (!) 21 19  Temp:  (!) 97.5 F (36.4 C) 98.3 F (36.8 C) 97.6 F (36.4 C)  TempSrc:  Oral Axillary Oral  SpO2:  95% 96% 96%  Weight: 72.1 kg     Height:  Intake/Output Summary (Last 24 hours) at 08/29/2020 1311 Last data filed at 08/29/2020 0421 Gross per 24 hour  Intake 722.59 ml  Output 500 ml  Net 222.59 ml    Filed Weights   08/25/20 0500 08/28/20 0500 08/29/20 0421  Weight: 61 kg 61.8 kg 72.1 kg    Physical examination: General:  Average built, not in obvious distress, on nasal cannula oxygen, appears to be mildly dyspneic HENT:   No scleral pallor or icterus noted. Oral mucosa is  moist.  Chest:   Diminished breath sounds bilaterally.   CVS: S1 &S2 heard. No murmur.  Regular rate and rhythm. Abdomen: Left lower quadrant tenderness on palpation some guarding rigidity and mild rebound tenderness.. Extremities: No cyanosis, clubbing or edema.  Peripheral pulses are palpable. Psych: Alert, awake and oriented, normal mood CNS:  No cranial nerve deficits.  Power equal in all extremities.   Skin: Warm and dry.  No rashes noted.  Data Reviewed: I have personally reviewed following labs and imaging studies  CBC: Recent Labs  Lab 08/25/20 0400 08/26/20 0405 08/27/20 0308 08/28/20 1103 08/29/20 0406  WBC 14.3* 25.0* 24.3* 25.3* 22.3*  HGB 12.8 13.7 12.7 14.2 15.5*  HCT 36.8 40.1 38.8 40.5 43.7  MCV 94.1 95.2 101.6* 92.9 89.9  PLT 323 416* 331 398 454*    Basic Metabolic Panel: Recent Labs  Lab 08/25/20 0400 08/25/20 1348 08/26/20 0405 08/27/20 0456 08/28/20 0522 08/28/20 2200 08/29/20 0406  NA 129* 125* 127* 132* 130* 129* 129*  K 2.7* 3.6 3.8 3.2* 3.3* 3.1* 3.2*  CL 102 98 103 104 104 101 104  CO2 19* 18* 15* 13* 13* 12* 12*  GLUCOSE 81 87 83 75 85 98 89  BUN 22 20 18 15 9  7* 10  CREATININE 1.02* 0.97 0.91 0.98 0.79 0.83 1.15*  CALCIUM 7.9* 7.7* 7.3* 6.8* 6.2* 6.7* 6.9*  MG  --  2.9*  --  1.7 1.6* 1.5* 1.4*  PHOS 2.2*  --   --  1.1* 1.2* 1.7* 2.3*    GFR: Estimated Creatinine Clearance: 47.6 mL/min (A) (by C-G formula based on SCr of 1.15 mg/dL (H)). Liver Function Tests: Recent Labs  Lab 08/23/20 1341 08/24/20 0405 08/29/20 0406  AST 15 18 28   ALT 12 11 18   ALKPHOS 62 47 49  BILITOT 2.0* 1.3* 1.9*  PROT 7.5 5.8* 5.1*  ALBUMIN 3.4* 2.6* 2.0*    Recent Labs  Lab 08/23/20 1341  LIPASE 20    No results for input(s): AMMONIA in the last 168 hours. Coagulation Profile: Recent Labs  Lab 08/27/20 0456  INR 1.3*    Cardiac Enzymes: No results for input(s): CKTOTAL, CKMB, CKMBINDEX, TROPONINI in the last 168 hours. BNP (last 3  results) No results for input(s): PROBNP in the last 8760 hours. HbA1C: No results for input(s): HGBA1C in the last 72 hours. CBG: No results for input(s): GLUCAP in the last 168 hours. Lipid Profile: No results for input(s): CHOL, HDL, LDLCALC, TRIG, CHOLHDL, LDLDIRECT in the last 72 hours. Thyroid Function Tests: No results for input(s): TSH, T4TOTAL, FREET4, T3FREE, THYROIDAB in the last 72 hours. Anemia Panel: No results for input(s): VITAMINB12, FOLATE, FERRITIN, TIBC, IRON, RETICCTPCT in the last 72 hours. Sepsis Labs: Recent Labs  Lab 08/26/20 0935 08/26/20 1226 08/28/20 2143 08/29/20 0925  LATICACIDVEN 1.2 1.4 2.5* 2.6*     Recent Results (from the past 240 hour(s))  Resp Panel by RT-PCR (Flu A&B, Covid) Nasopharyngeal Swab     Status: None  Collection Time: 08/23/20  3:35 PM   Specimen: Nasopharyngeal Swab; Nasopharyngeal(NP) swabs in vial transport medium  Result Value Ref Range Status   SARS Coronavirus 2 by RT PCR NEGATIVE NEGATIVE Final    Comment: (NOTE) SARS-CoV-2 target nucleic acids are NOT DETECTED.  The SARS-CoV-2 RNA is generally detectable in upper respiratory specimens during the acute phase of infection. The lowest concentration of SARS-CoV-2 viral copies this assay can detect is 138 copies/mL. A negative result does not preclude SARS-Cov-2 infection and should not be used as the sole basis for treatment or other patient management decisions. A negative result may occur with  improper specimen collection/handling, submission of specimen other than nasopharyngeal swab, presence of viral mutation(s) within the areas targeted by this assay, and inadequate number of viral copies(<138 copies/mL). A negative result must be combined with clinical observations, patient history, and epidemiological information. The expected result is Negative.  Fact Sheet for Patients:  BloggerCourse.com  Fact Sheet for Healthcare Providers:   SeriousBroker.it  This test is no t yet approved or cleared by the [place] FDA and  has been authorized for detection and/or diagnosis of SARS-CoV-2 by FDA under an Emergency Use Authorization (EUA). This EUA will remain  in effect (meaning this test can be used) for the duration of the COVID-19 declaration under Section 564(b)(1) of the Act, 21 U.S.C.section 360bbb-3(b)(1), unless the authorization is terminated  or revoked sooner.       Influenza A by PCR NEGATIVE NEGATIVE Final   Influenza B by PCR NEGATIVE NEGATIVE Final    Comment: (NOTE) The Xpert Xpress SARS-CoV-2/FLU/RSV plus assay is intended as an aid in the diagnosis of influenza from Nasopharyngeal swab specimens and should not be used as a sole basis for treatment. Nasal washings and aspirates are unacceptable for Xpert Xpress SARS-CoV-2/FLU/RSV testing.  Fact Sheet for Patients: BloggerCourse.com  Fact Sheet for Healthcare Providers: SeriousBroker.it  This test is not yet approved or cleared by the [place] FDA and has been authorized for detection and/or diagnosis of SARS-CoV-2 by FDA under an Emergency Use Authorization (EUA). This EUA will remain in effect (meaning this test can be used) for the duration of the COVID-19 declaration under Section 564(b)(1) of the Act, 21 U.S.C. section 360bbb-3(b)(1), unless the authorization is terminated or revoked.  Performed at [facility], [address], [place], [place] [address]   Culture, blood (routine x 2)     Status: None   Collection Time: 08/23/20  8:07 PM   Specimen: BLOOD RIGHT ARM  Result Value Ref Range Status   Specimen Description BLOOD RIGHT ARM  Final   Special Requests   Final    Blood Culture results may not be optimal due to an inadequate volume of blood received in culture bottles BOTTLES DRAWN AEROBIC AND ANAEROBIC   Culture   Final    NO GROWTH 5  DAYS Performed at [facility], [address] [place], [place] [address]    Report Status 08/28/2020 FINAL  Final  MRSA Next Gen by PCR, Nasal     Status: None   Collection Time: 08/24/20  3:43 AM   Specimen: Nasal Mucosa; Nasal Swab  Result Value Ref Range Status   MRSA by PCR Next Gen NOT DETECTED NOT DETECTED Final    Comment: (NOTE) The GeneXpert MRSA Assay (FDA approved for NASAL specimens only), is one component of a comprehensive MRSA colonization surveillance program. It is not intended to diagnose MRSA infection nor to guide or monitor treatment for  MRSA infections. Test performance is not FDA approved in patients less than 55 years old. Performed at [facility], [address] [place], [place] [address]       Radiology Studies: No results found.  Scheduled Meds:  [MAR Hold] Chlorhexidine Gluconate Cloth  6 each Topical Daily   [MAR Hold] docusate sodium  100 mg Oral BID   [MAR Hold] pantoprazole (PROTONIX) IV  40 mg Intravenous Q24H   [MAR Hold] polyethylene glycol  17 g Oral Daily   Continuous Infusions:  [MAR Hold] ertapenem 1,000 mg (08/28/20 1655)   lactated ringers 50 mL/hr at 08/29/20 1217   lactated ringers 10 mL/hr at 08/29/20 1301   potassium PHOSPHATE IVPB (in mmol) 30 mmol (08/29/20 0954)   [MAR Hold] promethazine (PHENERGAN) injection (IM or IVPB)       LOS: 6 days   Joycelyn Das, MD [facility] If 7PM-7AM, please contact night-coverage 08/29/2020, 1:11 PM

## 2020-08-29 NOTE — Plan of Care (Signed)
?  Problem: Education: ?Goal: Knowledge of General Education information will improve ?Description: Including pain rating scale, medication(s)/side effects and non-pharmacologic comfort measures ?Outcome: Progressing ?  ?Problem: Clinical Measurements: ?Goal: Ability to maintain clinical measurements within normal limits will improve ?Outcome: Progressing ?  ?Problem: Nutrition: ?Goal: Adequate nutrition will be maintained ?Outcome: Progressing ?  ?Problem: Coping: ?Goal: Level of anxiety will decrease ?Outcome: Progressing ?  ?Problem: Elimination: ?Goal: Will not experience complications related to bowel motility ?Outcome: Progressing ?  ?

## 2020-08-30 ENCOUNTER — Encounter (HOSPITAL_COMMUNITY): Payer: Self-pay | Admitting: General Surgery

## 2020-08-30 ENCOUNTER — Inpatient Hospital Stay: Payer: Self-pay

## 2020-08-30 DIAGNOSIS — I10 Essential (primary) hypertension: Secondary | ICD-10-CM | POA: Diagnosis not present

## 2020-08-30 DIAGNOSIS — N179 Acute kidney failure, unspecified: Secondary | ICD-10-CM | POA: Diagnosis not present

## 2020-08-30 DIAGNOSIS — K5792 Diverticulitis of intestine, part unspecified, without perforation or abscess without bleeding: Secondary | ICD-10-CM | POA: Diagnosis not present

## 2020-08-30 LAB — COMPREHENSIVE METABOLIC PANEL
ALT: 14 U/L (ref 0–44)
AST: 26 U/L (ref 15–41)
Albumin: 2 g/dL — ABNORMAL LOW (ref 3.5–5.0)
Alkaline Phosphatase: 23 U/L — ABNORMAL LOW (ref 38–126)
Anion gap: 8 (ref 5–15)
BUN: 13 mg/dL (ref 8–23)
CO2: 18 mmol/L — ABNORMAL LOW (ref 22–32)
Calcium: 7 mg/dL — ABNORMAL LOW (ref 8.9–10.3)
Chloride: 104 mmol/L (ref 98–111)
Creatinine, Ser: 0.91 mg/dL (ref 0.44–1.00)
GFR, Estimated: >60 mL/min (ref >60)
Glucose, Bld: 106 mg/dL — ABNORMAL HIGH (ref 70–99)
Potassium: 4 mmol/L (ref 3.5–5.1)
Sodium: 130 mmol/L — ABNORMAL LOW (ref 135–145)
Total Bilirubin: 1.2 mg/dL (ref 0.3–1.2)
Total Protein: 3.8 g/dL — ABNORMAL LOW (ref 6.5–8.1)

## 2020-08-30 LAB — CBC
HCT: 31.2 % — ABNORMAL LOW (ref 36.0–46.0)
Hemoglobin: 11.3 g/dL — ABNORMAL LOW (ref 12.0–15.0)
MCH: 32.8 pg (ref 26.0–34.0)
MCHC: 36.2 g/dL — ABNORMAL HIGH (ref 30.0–36.0)
MCV: 90.7 fL (ref 80.0–100.0)
Platelets: 264 10*3/uL (ref 150–400)
RBC: 3.44 MIL/uL — ABNORMAL LOW (ref 3.87–5.11)
RDW: 13.3 % (ref 11.5–15.5)
WBC: 17.5 10*3/uL — ABNORMAL HIGH (ref 4.0–10.5)
nRBC: 0 % (ref 0.0–0.2)

## 2020-08-30 LAB — MAGNESIUM: Magnesium: 1.5 mg/dL — ABNORMAL LOW (ref 1.7–2.4)

## 2020-08-30 LAB — PHOSPHORUS: Phosphorus: 3.4 mg/dL (ref 2.5–4.6)

## 2020-08-30 LAB — GLUCOSE, CAPILLARY
Glucose-Capillary: 103 mg/dL — ABNORMAL HIGH (ref 70–99)
Glucose-Capillary: 85 mg/dL (ref 70–99)

## 2020-08-30 MED ORDER — SODIUM CHLORIDE 0.9% FLUSH: 10.0000 mL | INTRAVENOUS | Status: DC | PRN

## 2020-08-30 MED ORDER — INSULIN ASPART 100 UNIT/ML IJ SOLN
0.0000 [IU] | Freq: Three times a day (TID) | INTRAMUSCULAR | Status: DC
Start: 2020-08-30 — End: 2020-09-04
  Administered 2020-09-01 – 2020-09-02 (×2): 1 [IU] via SUBCUTANEOUS
  Administered 2020-09-03 – 2020-09-04 (×3): 2 [IU] via SUBCUTANEOUS

## 2020-08-30 MED ORDER — CALCIUM GLUCONATE-NACL 1-0.675 GM/50ML-% IV SOLN
1.0000 g | Freq: Once | INTRAVENOUS | Status: AC
  Administered 2020-08-30: 1000 mg via INTRAVENOUS
  Filled 2020-08-30: qty 50

## 2020-08-30 MED ORDER — SODIUM CHLORIDE 0.9% FLUSH
10.0000 mL | Freq: Two times a day (BID) | INTRAVENOUS | Status: DC
  Administered 2020-08-30 – 2020-08-31 (×2): 10 mL
  Administered 2020-08-31 – 2020-09-01 (×2): 30 mL
  Administered 2020-09-01 – 2020-09-06 (×9): 10 mL
  Administered 2020-09-06: 30 mL
  Administered 2020-09-07 – 2020-09-08 (×3): 10 mL

## 2020-08-30 MED ORDER — TRAVASOL 10 % IV SOLN
INTRAVENOUS | Status: AC
  Filled 2020-08-30: qty 360

## 2020-08-30 MED ORDER — SODIUM CHLORIDE 0.9 % IV SOLN: INTRAVENOUS | Status: AC

## 2020-08-30 MED ORDER — MAGNESIUM SULFATE 2 GM/50ML IV SOLN
2.0000 g | Freq: Once | INTRAVENOUS | Status: AC
  Administered 2020-08-30: 2 g via INTRAVENOUS
  Filled 2020-08-30: qty 50

## 2020-08-30 NOTE — Progress Notes (Signed)
PHARMACY - TOTAL PARENTERAL NUTRITION CONSULT NOTE   Indication: Prolonged ileus  Patient Measurements: Height: 5\' 6"  (167.6 cm) Weight: 72.1 kg (158 lb 15.2 oz) IBW/kg (Calculated) : 59.3 TPN AdjBW (KG): 56.2 Body mass index is 25.66 kg/m. Usual Weight: ~60kg  Assessment: 58 YOF presenting with sigmoid diverticulitis, with perforation and abscess.  Now s/p Hartman's with colostomy, post-op ileus and consulted for initiation of TPN.    Glucose / Insulin: A1c 5 (03/2020), CBGs 70-90s not on insulin Electrolytes: 8/16 - K 3.2, CorCa 8.4 (Ca gluc 1g given), Na 129, Mg 1.4 (2g ordered), Phos 2.3 - Kphos 12-10-1971 given Renal: SCr 1.15, BUN 10 Hepatic: AST/ALT wnl, Tbili 1.9, TG with TPN labs in AM Intake / Output; MIVF: UOP 1.1 ml/kg/hr, Drain 135 mL, LR@100  > NS@75  GI Imaging: 8/10 CT abd: Sigmoid diverticulitis acute 8/12 CT abd: Developing abscess, possible rupture  GI Surgeries / Procedures:  8/14: IR drain attempt 8/16 Hartman's procedure w/ colostomy  Central access: for PICC placement today TPN start date: 8/17  Nutritional Goals: Awaiting RD recs for goal rate  RD Assessment:  Estimated Needs Total Energy Estimated Needs: 1700-1800 Total Protein Estimated Needs: 79-85 gr Total Fluid Estimated Needs: 1800 ml daily  Current Nutrition:  NPO  Plan:  Initiate TPN at 30 mL/hr at 1800 (provides 36g AA, 727 Kcal) Electrolytes in TPN: Inc Na 82mEq/L, K 78mEq/L, Ca 14mEq/L, Mg 60mEq/L, and Phos 79mmol/L. Cl:Ac 1:1 Add standard MVI and trace elements to TPN Initiate Sensitive q8h SSI and adjust as needed  Reduce MIVF to 75 mL/hr at 1800 TPN labs in AM  12m, PharmD Clinical Pharmacist Please check AMION for all [facility] Pharmacy numbers 08/30/2020 9:01 AM

## 2020-08-30 NOTE — Plan of Care (Signed)
  Problem: Education: Goal: Knowledge of General Education information will improve Description: Including pain rating scale, medication(s)/side effects and non-pharmacologic comfort measures Outcome: Progressing   Problem: Coping: Goal: Level of anxiety will decrease Outcome: Progressing   Problem: Pain Managment: Goal: General experience of comfort will improve Outcome: Progressing   

## 2020-08-30 NOTE — Plan of Care (Signed)

## 2020-08-30 NOTE — Progress Notes (Signed)
1 Day Post-Op   Subjective/Chief Complaint: Pt with some expected soreness   Objective: Vital signs in last 24 hours: Temp:  [97.6 F (36.4 C)-98.3 F (36.8 C)] 97.6 F (36.4 C) (08/17 0816) Pulse Rate:  [73-110] 82 (08/17 0816) Resp:  [16-23] 20 (08/17 0816) BP: (90-118)/(57-84) 102/57 (08/17 0816) SpO2:  [91 %-97 %] 96 % (08/17 0816) Last BM Date: 08/28/20  Intake/Output from previous day: 08/16 0701 - 08/17 0700 In: 2650 [I.V.:1300; IV Piggyback:1350] Out: 2185 [Urine:1900; Drains:135; Blood:150] Intake/Output this shift: No intake/output data recorded.  PE:  Constitutional: No acute distress, conversant, appears states age. Eyes: Anicteric sclerae, moist conjunctiva, no lid lag Lungs: Clear to auscultation bilaterally, normal respiratory effort CV: regular rate and rhythm, no murmurs, no peripheral edema, pedal pulses 2+ GI: Soft, no masses or hepatosplenomegaly, approp tender to palpation, ostomy pink, no stool in bag Skin: No rashes, palpation reveals normal turgor Psychiatric: appropriate judgment and insight, oriented to person, place, and time   Lab Results:  Recent Labs    08/28/20 1103 08/29/20 0406  WBC 25.3* 22.3*  HGB 14.2 15.5*  HCT 40.5 43.7  PLT 398 454*   BMET Recent Labs    08/28/20 2200 08/29/20 0406  NA 129* 129*  K 3.1* 3.2*  CL 101 104  CO2 12* 12*  GLUCOSE 98 89  BUN 7* 10  CREATININE 0.83 1.15*  CALCIUM 6.7* 6.9*   PT/INR No results for input(s): LABPROT, INR in the last 72 hours. ABG Recent Labs    08/28/20 2158  HCO3 15.0*    Studies/Results: No results found.  Anti-infectives: Anti-infectives (From admission, onward)    Start     Dose/Rate Route Frequency Ordered Stop   08/27/20 1530  ertapenem (INVANZ) 1,000 mg in sodium chloride 0.9 % 100 mL IVPB        1 g 200 mL/hr over 30 Minutes Intravenous Every 24 hours 08/27/20 1406     08/24/20 1145  ceFEPIme (MAXIPIME) 2 g in sodium chloride 0.9 % 100 mL IVPB   Status:  Discontinued        2 g 200 mL/hr over 30 Minutes Intravenous Every 12 hours 08/24/20 1051 08/27/20 1406   08/23/20 2100  ceFEPIme (MAXIPIME) 2 g in sodium chloride 0.9 % 100 mL IVPB  Status:  Discontinued        2 g 200 mL/hr over 30 Minutes Intravenous Every 24 hours 08/23/20 2051 08/24/20 1051   08/23/20 1700  ciprofloxacin (CIPRO) IVPB 400 mg  Status:  Discontinued        400 mg 200 mL/hr over 60 Minutes Intravenous Every 12 hours 08/23/20 1645 08/23/20 2041   08/23/20 1700  metroNIDAZOLE (FLAGYL) IVPB 500 mg  Status:  Discontinued        500 mg 100 mL/hr over 60 Minutes Intravenous Every 8 hours 08/23/20 1645 08/27/20 1406       Assessment/Plan: POD#1 s/p ex lap and Hartman's procedure with colostomy -plan to mobilize today -DC foley -PLan for PICC and TNA as pt likely to have ileus postop -abx -will con't to follow  LOS: 7 days    Axel Filler 08/30/2020

## 2020-08-30 NOTE — Progress Notes (Signed)
Peripherally Inserted Central Catheter Placement  The IV Nurse has discussed with the patient and/or persons authorized to consent for the patient, the purpose of this procedure and the potential benefits and risks involved with this procedure.  The benefits include less needle sticks, lab draws from the catheter, and the patient may be discharged home with the catheter. Risks include, but not limited to, infection, bleeding, blood clot (thrombus formation), and puncture of an artery; nerve damage and irregular heartbeat and possibility to perform a PICC exchange if needed/ordered by physician.  Alternatives to this procedure were also discussed.  Bard Power PICC patient education guide, fact sheet on infection prevention and patient information card has been provided to patient /or left at bedside.    PICC Placement Documentation  PICC Double Lumen 08/30/20 PICC Right Brachial 39 cm 0 cm (Active)  Indication for Insertion or Continuance of Line Administration of hyperosmolar/irritating solutions (i.e. TPN, Vancomycin, etc.) 08/30/20 1553  Exposed Catheter (cm) 0 cm 08/30/20 1553  Site Assessment Clean;Dry;Intact 08/30/20 1553  Lumen #1 Status Flushed;Saline locked;Blood return noted 08/30/20 1553  Lumen #2 Status Flushed;Saline locked;Blood return noted 08/30/20 1553  Dressing Type Transparent;Securing device 08/30/20 1553  Dressing Status Clean;Dry;Intact 08/30/20 1553  Antimicrobial disc in place? Yes 08/30/20 1553  Safety Lock Not Applicable 08/30/20 1553  Dressing Change Due 09/06/20 08/30/20 1553       Romie Jumper 08/30/2020, 3:58 PM

## 2020-08-30 NOTE — Progress Notes (Signed)
Nutrition Follow-up  DOCUMENTATION CODES:  Not applicable  INTERVENTION:  TPN management per Pharmacy  Monitor magnesium, potassium, and phosphorus daily for at least 3 days, MD to replete as needed, as pt is at risk for refeeding syndrome.  NUTRITION DIAGNOSIS:  Inadequate oral intake related to acute illness (diverticulitis) as evidenced by per patient/family report, meal completion < 25%. -- ongoing  GOAL:  Patient will meet greater than or equal to 90% of their needs -- not met  MONITOR:  Labs, Weight trends, I & O's  REASON FOR ASSESSMENT:  Consult New TPN/TNA  ASSESSMENT:  Pt with PMH significant for HTN, HLD, OA presented to the hospital with abdominal pain, N/V, and constipation with an inability to tolerate food. Pt admitted for diverticulitis with perforation and developing abscess with severe sepsis POA.  8/14 s/p failed attempt at CT guided aspiration/drainage of pelvic abscess  Pt is POD#1 s/p ex lap and Hartman's procedure with colostomy. Surgery plans for initiation of TPN today due to concerns for pt developing a postop ileus.   Medications: protonix Labs: Na 130 (L), Mg 1.5 (L)  UOP: 1.9L x24 hours JP Drain: 174m output x24 hours Colostomy: 082moutput x24 hours EBL: 15020m24 hours I/O: +5868m34mnce admit  Diet Order:   Diet Order             Diet NPO time specified  Diet effective now                  EDUCATION NEEDS:  Education needs have been addressed  Skin:  Skin Assessment: Skin Integrity Issues: Skin Integrity Issues:: Incisions Incisions: abdomen  Last BM:  8/15  Height:  Ht Readings from Last 1 Encounters:  08/23/20 5' 6"  (1.676 m)   Weight:  Wt Readings from Last 1 Encounters:  08/29/20 72.1 kg   BMI:  Body mass index is 25.66 kg/m.  Estimated Nutritional Needs:  Kcal:  1600-1800 Protein:  80-90 grams Fluid:  >1.6L/d    AmanLarkin Ina, RD, LDN (she/her/hers) RD pager number and weekend/on-call pager number  located in [place]

## 2020-08-30 NOTE — Progress Notes (Signed)
PROGRESS NOTE   Anne Aguirre  GUR:[id number] DOB: 19-Aug-1950 DOA: 08/23/2020 PCP: Pcp, No    Brief Narrative:  70 y.o. female with past medical history of hypertension presented to the hospital with abdominal pain, nausea, vomiting and cramps.  Patient was constipated prior to presentation.  Patient was unable to tolerate any food.  In the ED, patient was noted to have a diverticulitis with developing abscess.  Patient was then considered for admission to [facility] for further IR intervention and surgical follow-up.    During hospitalization, patient was seen by interventional radiology but there was no drainable abscess so he was treated conservatively with Invanz and general surgery follow-up.  Patient however persisted to have increasing abdominal pain persistent leukocytosis and elevated lactate, so patient underwent a Hartmann procedure on  on 08/29/2020.  Assessment & Plan:   Principal Problem:   Acute diverticulitis Active Problems:   Essential hypertension, benign   Acute renal failure superimposed on stage 3a chronic kidney disease (HCC)   Hyponatremia   Hyperbilirubinemia   Hypercalcemia  Acute sigmoid diverticulitis,  with perforation and developing abscess with severe sepsis present on admission Status post colonic resection and colostomy yesterday due to worsening abdominal symptoms.  Postop day 1 today.  General surgery on board.  We will continue to monitor closely.  Continue Invanz.  Will trend CBC.  Surgery expects a prolonged ileus so TPN has been recommended by surgery.  Continue NPO.  Continue normal saline.   Acute kidney injury superimposed on CKD IIIa  Improved.  Latest creatinine of 0.9.  Hypokalemia.  Improved after K-Phos.  Hypophosphatemia.   Improved after K-Phos replacement.  Hypomagnesemia.  We will replenish IV magnesium sulfate.   Hyponatremia  Mild.  We will continue to monitor.  We will change IV fluids to normal saline.  Hypocalcemia.    We will continue to monitor and replenish as necessary   Hyperbilirubinemia  Improved at this time.  Liver cyst on CT scan of the abdomen.  Essential hypertension  Antihypertensives on hold at this time.  Patient is on amlodipine, Avapro and nadolol at home.  Current blood pressure is stable and mostly on the lower side..   DVT prophylaxis: SCD's  Code Status:  Full code  Family Communication:  Spoke with the patient's husband at bedside.  Status is: Inpatient  Remains inpatient appropriate because:Inpatient level of care appropriate due to severity of illness, on IV antibiotics, status post surgical intervention,  Dispo: The patient is from: Home              Anticipated d/c is to: Home              Patient currently is not medically stable to d/c.   Difficult to place patient No   Consultants:  General Surgery IR  Procedures:  Status post Hartmann procedure with colostomy on 08/29/2020  Antimicrobials: Ertapenem  Subjective: Today, patient was seen and examined at bedside.  Complains of mild pain.  Shortness of breath has improved.  Patient's husband at bedside  Objective:  Vitals:   08/29/20 1610 08/29/20 1949 08/30/20 0329 08/30/20 0816  BP: 109/61 118/65 115/63 (!) 102/57  Pulse: 80  74 82  Resp: 20 20 (!) 23 20  Temp: 97.8 F (36.6 C) 98 F (36.7 C)  97.6 F (36.4 C)  TempSrc: Oral Oral  Oral  SpO2: 95% 97% 97% 96%  Weight:      Height:        Intake/Output Summary (  Last 24 hours) at 08/30/2020 1037 Last data filed at 08/30/2020 0946 Gross per 24 hour  Intake 2850 ml  Output 2480 ml  Net 370 ml    Filed Weights   08/25/20 0500 08/28/20 0500 08/29/20 0421  Weight: 61 kg 61.8 kg 72.1 kg    Physical examination: General:  Average built, not in obvious distress, on nasal cannula oxygen HENT:   No scleral pallor or icterus noted. Oral mucosa is moist.  Chest: Breath sounds bilaterally.  CVS: S1 &S2 heard. No murmur.  Regular rate and  rhythm. Abdomen: Abdominal wound with dressing and colostomy in place.  Could not hear bowel sounds. Extremities: No cyanosis, clubbing or edema.  Peripheral pulses are palpable. Psych: Alert, awake and oriented, normal mood CNS:  No cranial nerve deficits.  Power equal in all extremities.   Skin: Warm and dry.  Abdominal wound with dressing.   Data Reviewed: I have personally reviewed following labs and imaging studies  CBC: Recent Labs  Lab 08/26/20 0405 08/27/20 0308 08/28/20 1103 08/29/20 0406 08/30/20 0742  WBC 25.0* 24.3* 25.3* 22.3* 17.5*  HGB 13.7 12.7 14.2 15.5* 11.3*  HCT 40.1 38.8 40.5 43.7 31.2*  MCV 95.2 101.6* 92.9 89.9 90.7  PLT 416* 331 398 454* 264    Basic Metabolic Panel: Recent Labs  Lab 08/27/20 0456 08/28/20 0522 08/28/20 2200 08/29/20 0406 08/30/20 0742  NA 132* 130* 129* 129* 130*  K 3.2* 3.3* 3.1* 3.2* 4.0  CL 104 104 101 104 104  CO2 13* 13* 12* 12* 18*  GLUCOSE 75 85 98 89 106*  BUN 15 9 7* 10 13  CREATININE 0.98 0.79 0.83 1.15* 0.91  CALCIUM 6.8* 6.2* 6.7* 6.9* 7.0*  MG 1.7 1.6* 1.5* 1.4* 1.5*  PHOS 1.1* 1.2* 1.7* 2.3* 3.4    GFR: Estimated Creatinine Clearance: 60.2 mL/min (by C-G formula based on SCr of 0.91 mg/dL). Liver Function Tests: Recent Labs  Lab 08/23/20 1341 08/24/20 0405 08/29/20 0406 08/30/20 0742  AST 15 18 28 26   ALT 12 11 18 14   ALKPHOS 62 47 49 23*  BILITOT 2.0* 1.3* 1.9* 1.2  PROT 7.5 5.8* 5.1* 3.8*  ALBUMIN 3.4* 2.6* 2.0* 2.0*    Recent Labs  Lab 08/23/20 1341  LIPASE 20    No results for input(s): AMMONIA in the last 168 hours. Coagulation Profile: Recent Labs  Lab 08/27/20 0456  INR 1.3*    Cardiac Enzymes: No results for input(s): CKTOTAL, CKMB, CKMBINDEX, TROPONINI in the last 168 hours. BNP (last 3 results) No results for input(s): PROBNP in the last 8760 hours. HbA1C: No results for input(s): HGBA1C in the last 72 hours. CBG: No results for input(s): GLUCAP in the last 168  hours. Lipid Profile: No results for input(s): CHOL, HDL, LDLCALC, TRIG, CHOLHDL, LDLDIRECT in the last 72 hours. Thyroid Function Tests: No results for input(s): TSH, T4TOTAL, FREET4, T3FREE, THYROIDAB in the last 72 hours. Anemia Panel: No results for input(s): VITAMINB12, FOLATE, FERRITIN, TIBC, IRON, RETICCTPCT in the last 72 hours. Sepsis Labs: Recent Labs  Lab 08/26/20 0935 08/26/20 1226 08/28/20 2143 08/29/20 0925  LATICACIDVEN 1.2 1.4 2.5* 2.6*     Recent Results (from the past 240 hour(s))  Resp Panel by RT-PCR (Flu A&B, Covid) Nasopharyngeal Swab     Status: None   Collection Time: 08/23/20  3:35 PM   Specimen: Nasopharyngeal Swab; Nasopharyngeal(NP) swabs in vial transport medium  Result Value Ref Range Status   SARS Coronavirus 2 by RT PCR NEGATIVE NEGATIVE  Final    Comment: (NOTE) SARS-CoV-2 target nucleic acids are NOT DETECTED.  The SARS-CoV-2 RNA is generally detectable in upper respiratory specimens during the acute phase of infection. The lowest concentration of SARS-CoV-2 viral copies this assay can detect is 138 copies/mL. A negative result does not preclude SARS-Cov-2 infection and should not be used as the sole basis for treatment or other patient management decisions. A negative result may occur with  improper specimen collection/handling, submission of specimen other than nasopharyngeal swab, presence of viral mutation(s) within the areas targeted by this assay, and inadequate number of viral copies(<138 copies/mL). A negative result must be combined with clinical observations, patient history, and epidemiological information. The expected result is Negative.  Fact Sheet for Patients:  BloggerCourse.com  Fact Sheet for Healthcare Providers:  SeriousBroker.it  This test is no t yet approved or cleared by the [place] FDA and  has been authorized for detection and/or diagnosis of SARS-CoV-2  by FDA under an Emergency Use Authorization (EUA). This EUA will remain  in effect (meaning this test can be used) for the duration of the COVID-19 declaration under Section 564(b)(1) of the Act, 21 U.S.C.section 360bbb-3(b)(1), unless the authorization is terminated  or revoked sooner.       Influenza A by PCR NEGATIVE NEGATIVE Final   Influenza B by PCR NEGATIVE NEGATIVE Final    Comment: (NOTE) The Xpert Xpress SARS-CoV-2/FLU/RSV plus assay is intended as an aid in the diagnosis of influenza from Nasopharyngeal swab specimens and should not be used as a sole basis for treatment. Nasal washings and aspirates are unacceptable for Xpert Xpress SARS-CoV-2/FLU/RSV testing.  Fact Sheet for Patients: BloggerCourse.com  Fact Sheet for Healthcare Providers: SeriousBroker.it  This test is not yet approved or cleared by the [place] FDA and has been authorized for detection and/or diagnosis of SARS-CoV-2 by FDA under an Emergency Use Authorization (EUA). This EUA will remain in effect (meaning this test can be used) for the duration of the COVID-19 declaration under Section 564(b)(1) of the Act, 21 U.S.C. section 360bbb-3(b)(1), unless the authorization is terminated or revoked.  Performed at [facility], [address] [place], [place] [address]   Culture, blood (routine x 2)     Status: None   Collection Time: 08/23/20  8:07 PM   Specimen: BLOOD RIGHT ARM  Result Value Ref Range Status   Specimen Description BLOOD RIGHT ARM  Final   Special Requests   Final    Blood Culture results may not be optimal due to an inadequate volume of blood received in culture bottles BOTTLES DRAWN AEROBIC AND ANAEROBIC   Culture   Final    NO GROWTH 5 DAYS Performed at [facility], [address], [place], [place] [address]    Report Status 08/28/2020 FINAL  Final  MRSA Next Gen by PCR, Nasal     Status: None   Collection Time: 08/24/20   3:43 AM   Specimen: Nasal Mucosa; Nasal Swab  Result Value Ref Range Status   MRSA by PCR Next Gen NOT DETECTED NOT DETECTED Final    Comment: (NOTE) The GeneXpert MRSA Assay (FDA approved for NASAL specimens only), is one component of a comprehensive MRSA colonization surveillance program. It is not intended to diagnose MRSA infection nor to guide or monitor treatment for MRSA infections. Test performance is not FDA approved in patients less than 39 years old. Performed at [facility], [address], [place], [place] [address]       Radiology  Studies: [place] EKG SITE RITE  Result Date: 08/30/2020 If Site Rite image not attached, placement could not be confirmed due to current cardiac rhythm.   Scheduled Meds:  Chlorhexidine Gluconate Cloth  6 each Topical Daily   pantoprazole (PROTONIX) IV  40 mg Intravenous Q24H   sodium chloride flush  10-40 mL Intracatheter Q12H   Continuous Infusions:  acetaminophen 1,000 mg (08/30/20 0813)   ertapenem 1,000 mg (08/30/20 0849)   lactated ringers 100 mL/hr at 08/29/20 1715   methocarbamol (ROBAXIN) IV 1,000 mg (08/30/20 0848)   promethazine (PHENERGAN) injection (IM or IVPB)       LOS: 7 days   Joycelyn Das, MD [facility] If 7PM-7AM, please contact night-coverage 08/30/2020, 10:37 AM

## 2020-08-31 ENCOUNTER — Inpatient Hospital Stay (HOSPITAL_COMMUNITY): Payer: Medicare PPO

## 2020-08-31 DIAGNOSIS — N179 Acute kidney failure, unspecified: Secondary | ICD-10-CM | POA: Diagnosis not present

## 2020-08-31 DIAGNOSIS — K5792 Diverticulitis of intestine, part unspecified, without perforation or abscess without bleeding: Secondary | ICD-10-CM | POA: Diagnosis not present

## 2020-08-31 DIAGNOSIS — I10 Essential (primary) hypertension: Secondary | ICD-10-CM | POA: Diagnosis not present

## 2020-08-31 LAB — CBC
HCT: 31.2 % — ABNORMAL LOW (ref 36.0–46.0)
Hemoglobin: 11.2 g/dL — ABNORMAL LOW (ref 12.0–15.0)
MCH: 32.6 pg (ref 26.0–34.0)
MCHC: 35.9 g/dL (ref 30.0–36.0)
MCV: 90.7 fL (ref 80.0–100.0)
Platelets: 306 10*3/uL (ref 150–400)
RBC: 3.44 MIL/uL — ABNORMAL LOW (ref 3.87–5.11)
RDW: 13.2 % (ref 11.5–15.5)
WBC: 29.6 10*3/uL — ABNORMAL HIGH (ref 4.0–10.5)
nRBC: 0 % (ref 0.0–0.2)

## 2020-08-31 LAB — GLUCOSE, CAPILLARY
Glucose-Capillary: 105 mg/dL — ABNORMAL HIGH (ref 70–99)
Glucose-Capillary: 137 mg/dL — ABNORMAL HIGH (ref 70–99)
Glucose-Capillary: 133 mg/dL — ABNORMAL HIGH (ref 70–99)

## 2020-08-31 LAB — COMPREHENSIVE METABOLIC PANEL
ALT: 16 U/L (ref 0–44)
AST: 24 U/L (ref 15–41)
Albumin: 2 g/dL — ABNORMAL LOW (ref 3.5–5.0)
Alkaline Phosphatase: 34 U/L — ABNORMAL LOW (ref 38–126)
Anion gap: 7 (ref 5–15)
BUN: 13 mg/dL (ref 8–23)
CO2: 18 mmol/L — ABNORMAL LOW (ref 22–32)
Calcium: 7.4 mg/dL — ABNORMAL LOW (ref 8.9–10.3)
Chloride: 106 mmol/L (ref 98–111)
Creatinine, Ser: 0.64 mg/dL (ref 0.44–1.00)
GFR, Estimated: >60 mL/min (ref >60)
Glucose, Bld: 119 mg/dL — ABNORMAL HIGH (ref 70–99)
Potassium: 3.7 mmol/L (ref 3.5–5.1)
Sodium: 131 mmol/L — ABNORMAL LOW (ref 135–145)
Total Bilirubin: 0.9 mg/dL (ref 0.3–1.2)
Total Protein: 4.4 g/dL — ABNORMAL LOW (ref 6.5–8.1)

## 2020-08-31 LAB — TRIGLYCERIDES: Triglycerides: 77 mg/dL (ref <150)

## 2020-08-31 LAB — SURGICAL PATHOLOGY

## 2020-08-31 LAB — PHOSPHORUS: Phosphorus: 2 mg/dL — ABNORMAL LOW (ref 2.5–4.6)

## 2020-08-31 LAB — MAGNESIUM: Magnesium: 1.5 mg/dL — ABNORMAL LOW (ref 1.7–2.4)

## 2020-08-31 MED ORDER — POTASSIUM PHOSPHATES 15 MMOLE/5ML IV SOLN
30.0000 mmol | Freq: Once | INTRAVENOUS | Status: AC
  Administered 2020-08-31: 30 mmol via INTRAVENOUS
  Filled 2020-08-31: qty 10

## 2020-08-31 MED ORDER — MORPHINE SULFATE (PF) 4 MG/ML IV SOLN
4.0000 mg | INTRAVENOUS | Status: DC | PRN
  Administered 2020-08-31 – 2020-09-03 (×12): 4 mg via INTRAVENOUS
  Filled 2020-08-31 (×12): qty 1

## 2020-08-31 MED ORDER — FUROSEMIDE 10 MG/ML IJ SOLN
20.0000 mg | Freq: Once | INTRAMUSCULAR | Status: AC
  Administered 2020-08-31: 20 mg via INTRAVENOUS
  Filled 2020-08-31: qty 2

## 2020-08-31 MED ORDER — TRAVASOL 10 % IV SOLN
INTRAVENOUS | Status: AC
  Filled 2020-08-31: qty 540

## 2020-08-31 MED ORDER — MAGNESIUM SULFATE 2 GM/50ML IV SOLN
2.0000 g | Freq: Once | INTRAVENOUS | Status: AC
  Administered 2020-08-31: 2 g via INTRAVENOUS
  Filled 2020-08-31: qty 50

## 2020-08-31 MED ORDER — ENOXAPARIN SODIUM 40 MG/0.4ML IJ SOSY
40.0000 mg | PREFILLED_SYRINGE | INTRAMUSCULAR | Status: DC
  Administered 2020-08-31 – 2020-09-04 (×5): 40 mg via SUBCUTANEOUS
  Filled 2020-08-31 (×5): qty 0.4

## 2020-08-31 NOTE — Progress Notes (Signed)
PROGRESS NOTE   Anne Aguirre  ZOX:[id number] DOB: 09/28/1950 DOA: 08/23/2020 PCP: Pcp, No    Brief Narrative:  70 y.o. female with past medical history of hypertension presented to the hospital with abdominal pain, nausea, vomiting and cramps.  Patient was constipated prior to presentation.  Patient was unable to tolerate any food.  In the ED, patient was noted to have a diverticulitis with developing abscess.  Patient was then considered for admission to [facility] for further IR intervention and surgical follow-up.    During hospitalization, patient was seen by interventional radiology but there was no drainable abscess so he was treated initially conservatively with Invanz and general surgery followed-up.  Patient however persisted to have increasing abdominal pain persistent leukocytosis and elevated lactate, so patient underwent a Hartmann procedure on  on 08/29/2020.  Assessment & Plan:   Principal Problem:   Acute diverticulitis Active Problems:   Essential hypertension, benign   Acute renal failure superimposed on stage 3a chronic kidney disease (HCC)   Hyponatremia   Hyperbilirubinemia   Hypercalcemia  Acute sigmoid diverticulitis,  with perforation and peritonitis with severe sepsis present on admission Status post colonic resection and colostomy  due to worsening abdominal symptoms.  Postop day 2 today.  General surgery on board.  We will continue to monitor closely.  Continue Invanz.  Will trend CBC.  Surgery expects a prolonged ileus so TPN has been recommended by surgery.  Continue NPO.  Continue normal saline hydration for now but will try 1 dose of IV Lasix..  Decrease dose of normal saline.  Suspected ileus, on TPN at this time.  Postoperative pain.  We will increase morphine to 4 mg for now.  Continue muscle relaxant.  Mild dyspnea.  Continue incentive spirometry flutter valve.  We will give 1 dose of IV Lasix today.  We will get x-ray of the chest today   Acute  kidney injury superimposed on CKD IIIa  Improved.  Latest creatinine of 0.6  Hypokalemia.  Improved after K-Phos.  Latest potassium of 3.7  Hypophosphatemia.   Latest phosphate of 2.0.  Continue potassium phosphate.  Hypomagnesemia.  We will replenish IV magnesium sulfate.  Check levels in a.m.   Hyponatremia  Mild.  We will continue to monitor.  On normal saline.  Hypocalcemia.   We will continue to monitor and replenish as necessary   Hyperbilirubinemia  Improved at this time.  Liver cyst on CT scan of the abdomen.  Essential hypertension  Antihypertensives on hold at this time.  Patient is on amlodipine, Avapro and nadolol at home.  Current blood pressure is stable    DVT prophylaxis: SCD's  Code Status:  Full code  Family Communication:  Spoke with the patient's husband at bedside.  Status is: Inpatient  Remains inpatient appropriate because:Inpatient level of care appropriate due to severity of illness, on IV antibiotics, status post surgical intervention, expected ileus on TPN Dispo: The patient is from: Home              Anticipated d/c is to: Home              Patient currently is not medically stable to d/c.   Difficult to place patient No   Consultants:  General Surgery IR  Procedures:  Status post Hartmann procedure with colostomy on 08/29/2020  Antimicrobials: Ertapenem  Subjective: Today, patient was seen and examined at bedside.  Continues to complain of increased abdominal pain.  Feels dyspneic.  Could not sleep well.  Objective:  Vitals:   08/30/20 2337 08/31/20 0000 08/31/20 0407 08/31/20 0800  BP: 130/73 120/71 131/82 120/79  Pulse: 83 78 92 86  Resp: (!) 28 (!) 23 (!) 25 (!) 23  Temp: 98.1 F (36.7 C)  98.2 F (36.8 C) 98.4 F (36.9 C)  TempSrc: Oral  Oral Oral  SpO2: 98% 97% 100% 97%  Weight:      Height:        Intake/Output Summary (Last 24 hours) at 08/31/2020 1020 Last data filed at 08/31/2020 0410 Gross per 24 hour  Intake  272.96 ml  Output 725 ml  Net -452.04 ml    Filed Weights   08/28/20 0500 08/29/20 0421 08/30/20 0920  Weight: 61.8 kg 72.1 kg 71.9 kg    Physical examination:  General:, On nasal cannula oxygen, mildly tachypneic. HENT:   No scleral pallor or icterus noted. Oral mucosa is moist.  Chest: Diminished breath sounds bilaterally.  Mildly tachypneic CVS: S1 &S2 heard. No murmur.  Regular rate and rhythm. Abdomen: Abdominal wound with dressing and colostomy in place.  Could not hear bowel sounds. Extremities: No cyanosis, clubbing or edema.  Peripheral pulses are palpable. Psych: Alert, awake and oriented, normal mood CNS:  No cranial nerve deficits.  Power equal in all extremities.   Skin: Warm and dry.  Abdominal wound with dressing.   Data Reviewed: I have personally reviewed following labs and imaging studies  CBC: Recent Labs  Lab 08/27/20 0308 08/28/20 1103 08/29/20 0406 08/30/20 0742 08/31/20 0645  WBC 24.3* 25.3* 22.3* 17.5* 29.6*  HGB 12.7 14.2 15.5* 11.3* 11.2*  HCT 38.8 40.5 43.7 31.2* 31.2*  MCV 101.6* 92.9 89.9 90.7 90.7  PLT 331 398 454* 264 306    Basic Metabolic Panel: Recent Labs  Lab 08/28/20 0522 08/28/20 2200 08/29/20 0406 08/30/20 0742 08/31/20 0645  NA 130* 129* 129* 130* 131*  K 3.3* 3.1* 3.2* 4.0 3.7  CL 104 101 104 104 106  CO2 13* 12* 12* 18* 18*  GLUCOSE 85 98 89 106* 119*  BUN 9 7* 10 13 13   CREATININE 0.79 0.83 1.15* 0.91 0.64  CALCIUM 6.2* 6.7* 6.9* 7.0* 7.4*  MG 1.6* 1.5* 1.4* 1.5* 1.5*  PHOS 1.2* 1.7* 2.3* 3.4 2.0*    GFR: Estimated Creatinine Clearance: 68.3 mL/min (by C-G formula based on SCr of 0.64 mg/dL). Liver Function Tests: Recent Labs  Lab 08/29/20 0406 08/30/20 0742 08/31/20 0645  AST 28 26 24   ALT 18 14 16   ALKPHOS 49 23* 34*  BILITOT 1.9* 1.2 0.9  PROT 5.1* 3.8* 4.4*  ALBUMIN 2.0* 2.0* 2.0*    No results for input(s): LIPASE, AMYLASE in the last 168 hours.  No results for input(s): AMMONIA in the last  168 hours. Coagulation Profile: Recent Labs  Lab 08/27/20 0456  INR 1.3*    Cardiac Enzymes: No results for input(s): CKTOTAL, CKMB, CKMBINDEX, TROPONINI in the last 168 hours. BNP (last 3 results) No results for input(s): PROBNP in the last 8760 hours. HbA1C: No results for input(s): HGBA1C in the last 72 hours. CBG: Recent Labs  Lab 08/30/20 1651 08/30/20 2334 08/31/20 0407  GLUCAP 85 103* 105*   Lipid Profile: Recent Labs    08/31/20 0645  TRIG 77   Thyroid Function Tests: No results for input(s): TSH, T4TOTAL, FREET4, T3FREE, THYROIDAB in the last 72 hours. Anemia Panel: No results for input(s): VITAMINB12, FOLATE, FERRITIN, TIBC, IRON, RETICCTPCT in the last 72 hours. Sepsis Labs: Recent Labs  Lab 08/26/20 0935  08/26/20 1226 08/28/20 2143 08/29/20 0925  LATICACIDVEN 1.2 1.4 2.5* 2.6*     Recent Results (from the past 240 hour(s))  Resp Panel by RT-PCR (Flu A&B, Covid) Nasopharyngeal Swab     Status: None   Collection Time: 08/23/20  3:35 PM   Specimen: Nasopharyngeal Swab; Nasopharyngeal(NP) swabs in vial transport medium  Result Value Ref Range Status   SARS Coronavirus 2 by RT PCR NEGATIVE NEGATIVE Final    Comment: (NOTE) SARS-CoV-2 target nucleic acids are NOT DETECTED.  The SARS-CoV-2 RNA is generally detectable in upper respiratory specimens during the acute phase of infection. The lowest concentration of SARS-CoV-2 viral copies this assay can detect is 138 copies/mL. A negative result does not preclude SARS-Cov-2 infection and should not be used as the sole basis for treatment or other patient management decisions. A negative result may occur with  improper specimen collection/handling, submission of specimen other than nasopharyngeal swab, presence of viral mutation(s) within the areas targeted by this assay, and inadequate number of viral copies(<138 copies/mL). A negative result must be combined with clinical observations, patient history,  and epidemiological information. The expected result is Negative.  Fact Sheet for Patients:  BloggerCourse.com  Fact Sheet for Healthcare Providers:  SeriousBroker.it  This test is no t yet approved or cleared by the [place] FDA and  has been authorized for detection and/or diagnosis of SARS-CoV-2 by FDA under an Emergency Use Authorization (EUA). This EUA will remain  in effect (meaning this test can be used) for the duration of the COVID-19 declaration under Section 564(b)(1) of the Act, 21 U.S.C.section 360bbb-3(b)(1), unless the authorization is terminated  or revoked sooner.       Influenza A by PCR NEGATIVE NEGATIVE Final   Influenza B by PCR NEGATIVE NEGATIVE Final    Comment: (NOTE) The Xpert Xpress SARS-CoV-2/FLU/RSV plus assay is intended as an aid in the diagnosis of influenza from Nasopharyngeal swab specimens and should not be used as a sole basis for treatment. Nasal washings and aspirates are unacceptable for Xpert Xpress SARS-CoV-2/FLU/RSV testing.  Fact Sheet for Patients: BloggerCourse.com  Fact Sheet for Healthcare Providers: SeriousBroker.it  This test is not yet approved or cleared by the [place] FDA and has been authorized for detection and/or diagnosis of SARS-CoV-2 by FDA under an Emergency Use Authorization (EUA). This EUA will remain in effect (meaning this test can be used) for the duration of the COVID-19 declaration under Section 564(b)(1) of the Act, 21 U.S.C. section 360bbb-3(b)(1), unless the authorization is terminated or revoked.  Performed at [facility], [address] [place], [place] [address]   Culture, blood (routine x 2)     Status: None   Collection Time: 08/23/20  8:07 PM   Specimen: BLOOD RIGHT ARM  Result Value Ref Range Status   Specimen Description BLOOD RIGHT ARM  Final   Special Requests   Final    Blood  Culture results may not be optimal due to an inadequate volume of blood received in culture bottles BOTTLES DRAWN AEROBIC AND ANAEROBIC   Culture   Final    NO GROWTH 5 DAYS Performed at [facility], [address], [place], [place] [address]    Report Status 08/28/2020 FINAL  Final  MRSA Next Gen by PCR, Nasal     Status: None   Collection Time: 08/24/20  3:43 AM   Specimen: Nasal Mucosa; Nasal Swab  Result Value Ref Range Status   MRSA by PCR Next Gen NOT DETECTED NOT DETECTED  Final    Comment: (NOTE) The GeneXpert MRSA Assay (FDA approved for NASAL specimens only), is one component of a comprehensive MRSA colonization surveillance program. It is not intended to diagnose MRSA infection nor to guide or monitor treatment for MRSA infections. Test performance is not FDA approved in patients less than 49 years old. Performed at [facility], [address], [place], [place] [address]       Radiology Studies: [place] EKG SITE RITE  Result Date: 08/30/2020 If Site Rite image not attached, placement could not be confirmed due to current cardiac rhythm.   Scheduled Meds:  Chlorhexidine Gluconate Cloth  6 each Topical Daily   enoxaparin (LOVENOX) injection  40 mg Subcutaneous Q24H   furosemide  20 mg Intravenous Once   insulin aspart  0-6 Units Subcutaneous Q8H   pantoprazole (PROTONIX) IV  40 mg Intravenous Q24H   sodium chloride flush  10-40 mL Intracatheter Q12H   sodium chloride flush  10-40 mL Intracatheter Q12H   Continuous Infusions:  sodium chloride 75 mL/hr at 08/30/20 1122   ertapenem 1,000 mg (08/30/20 0849)   magnesium sulfate bolus IVPB     methocarbamol (ROBAXIN) IV 1,000 mg (08/31/20 0148)   potassium PHOSPHATE IVPB (in mmol)     promethazine (PHENERGAN) injection (IM or IVPB)     TPN ADULT (ION) 30 mL/hr at 08/30/20 1720   TPN ADULT (ION)       LOS: 8 days   Joycelyn Das, MD [facility] If 7PM-7AM, please contact night-coverage 08/31/2020, 10:20 AM

## 2020-08-31 NOTE — Evaluation (Signed)
Physical Therapy Evaluation Patient Details Name: Anne Aguirre MRN: [id number] DOB: 10-Feb-1950 Today's Date: 08/31/2020   History of Present Illness  70 y.o. female presented 08/23/20 to the emergency department for evaluation of abdominal pain, nausea, and vomiting. 8/12 CT abd +abscess in pelvis; 8/14 Attempt at CT guided aspiration/drainage of pelvic abscess; 8/16 exp lap with colostomy due to perforated diverticulitis PMH-HTN  Clinical Impression   Pt admitted secondary to problem above with deficits below. PTA patient was mostly independent with occasional need to don socks/shoes due to back pain/sciatica. Pt currently requires up to mod assist to get OOB and min assist for standing and transferring.  Anticipate patient will benefit from PT to address problems listed below.Will continue to follow acutely to maximize functional mobility independence and safety.       Follow Up Recommendations Home health PT;Supervision for mobility/OOB    Equipment Recommendations  Rolling walker with 5" wheels    Recommendations for Other Services OT consult     Precautions / Restrictions Precautions Precautions: Fall      Mobility  Bed Mobility Overal bed mobility: Needs Assistance Bed Mobility: Rolling;Sidelying to Sit Rolling: Min assist Sidelying to sit: Mod assist;HOB elevated       General bed mobility comments: using rail; step by step sequencing for rolling    Transfers Overall transfer level: Needs assistance Equipment used: 1 person hand held assist Transfers: Sit to/from Stand Sit to Stand: Min assist         General transfer comment: boosting assist during initial lift off to come to stand  Ambulation/Gait Ambulation/Gait assistance: Min assist;+2 safety/equipment (husband present, but pt not holding onto him) Gait Distance (Feet): 2 Feet Assistive device: 1 person hand held assist Gait Pattern/deviations: Step-to pattern;Decreased step length - left;Decreased step  length - right;Shuffle     General Gait Details: shuffling steps with poor foot clearance and describing the socks as "sticking to the floor"  Stairs            Wheelchair Mobility    Modified Rankin (Stroke Patients Only)       Balance Overall balance assessment: Needs assistance Sitting-balance support: No upper extremity supported;Feet supported Sitting balance-Leahy Scale: Fair Sitting balance - Comments: EOB several minutes catching her breath prior to standing   Standing balance support: Single extremity supported Standing balance-Leahy Scale: Poor Standing balance comment: initial imbalance upon standing (anteriorly)                             Pertinent Vitals/Pain Pain Assessment: 0-10 Pain Score: 7  Pain Location: abd Pain Descriptors / Indicators: Constant;Discomfort;Operative site guarding Pain Intervention(s): Limited activity within patient's tolerance;Monitored during session;Repositioned    Home Living Family/patient expects to be discharged to:: Private residence Living Arrangements: Spouse/significant other Available Help at Discharge: Family;Available 24 hours/day Type of Home: House Home Access: Ramped entrance     Home Layout: One level Home Equipment: None      Prior Function Level of Independence: Needs assistance   Gait / Transfers Assistance Needed: independent no device  ADL's / Homemaking Assistance Needed: occasional assist with socks and shoes due to back pain and sciatica        Hand Dominance        Extremity/Trunk Assessment   Upper Extremity Assessment Upper Extremity Assessment: Overall WFL for tasks assessed    Lower Extremity Assessment Lower Extremity Assessment: Generalized weakness (bil LE edema; difficulty advancing feet during gait; assist  to stand)    Cervical / Trunk Assessment Cervical / Trunk Assessment: Other exceptions Cervical / Trunk Exceptions: new colostomy  Communication    Communication: No difficulties  Cognition Arousal/Alertness: Awake/alert Behavior During Therapy: WFL for tasks assessed/performed Overall Cognitive Status: Within Functional Limits for tasks assessed                                        General Comments General comments (skin integrity, edema, etc.): Husband present and very helpful/encouraging. HR 90 to 120 bpm; RR 30s    Exercises     Assessment/Plan    PT Assessment Patient needs continued PT services  PT Problem List Decreased strength;Decreased activity tolerance;Decreased balance;Decreased mobility;Decreased knowledge of use of DME;Decreased knowledge of precautions;Cardiopulmonary status limiting activity;Pain       PT Treatment Interventions DME instruction;Gait training;Functional mobility training;Therapeutic activities;Therapeutic exercise;Balance training;Patient/family education    PT Goals (Current goals can be found in the Care Plan section)  Acute Rehab PT Goals Patient Stated Goal: to feel better and go home PT Goal Formulation: With patient Time For Goal Achievement: 09/14/20 Potential to Achieve Goals: Good    Frequency Min 3X/week   Barriers to discharge        Co-evaluation               AM-PAC PT "6 Clicks" Mobility  Outcome Measure Help needed turning from your back to your side while in a flat bed without using bedrails?: A Little Help needed moving from lying on your back to sitting on the side of a flat bed without using bedrails?: A Lot Help needed moving to and from a bed to a chair (including a wheelchair)?: A Lot Help needed standing up from a chair using your arms (e.g., wheelchair or bedside chair)?: A Lot Help needed to walk in hospital room?: A Lot Help needed climbing 3-5 steps with a railing? : Total 6 Click Score: 12    End of Session Equipment Utilized During Treatment: Gait belt;Oxygen Activity Tolerance: Patient limited by fatigue;Treatment limited  secondary to medical complications (Comment) (incr RR and HR) Patient left: in chair;with call bell/phone within reach;with chair alarm set;with family/visitor present Nurse Communication: Mobility status PT Visit Diagnosis: Unsteadiness on feet (R26.81);Pain Pain - Right/Left:  (left) Pain - part of body:  (abdomen)    Time: 3532-9924 PT Time Calculation (min) (ACUTE ONLY): 29 min   Charges:   PT Evaluation $PT Eval Moderate Complexity: 1 Mod PT Treatments $Therapeutic Activity: 8-22 mins         Jerolyn Center, PT Pager [phone]   Zena Amos 08/31/2020, 9:49 AM

## 2020-08-31 NOTE — TOC Initial Note (Signed)
Transition of Care [facility]) - Initial/Assessment Note    Patient Details  Name: Anne Aguirre MRN: [id number] Date of Birth: 1950-11-07  [facility]) CM/SW Contact:    Joanne Chars, LCSW Phone Number: 08/31/2020, 2:04 PM  Clinical Narrative:  CSW met with pt and husband Richardson Landry to discuss recommendation for [facility].  Permission given to speak with husband.  They are agreeable to [facility], choice document given, no agency preference.  Husband home full time.  No DME in the home currently and they do want rolling walker and 3n1.  PCP was Dr Sharren Bridge who passed recently, husband has not identified new PCP yet but agreed to contact [facility] and locate options, discussed that PCP needed for [facility].  Pt has had covid vaccine and 2 boosters.                  Expected Discharge Plan: [place] Barriers to Discharge: Continued Medical Work up   Patient Goals and CMS Choice Patient states their goals for this hospitalization and ongoing recovery are:: "get back to where I was" CMS Medicare.gov Compare Post Acute Care list provided to:: Patient Choice offered to / list presented to : Patient  Expected Discharge Plan and Services Expected Discharge Plan: [place] Choice: [place] arrangements for the past 2 months: Single Family Home                           HH Arranged: PT [facility] Agency: [facility] Date [place]: 08/31/20 Time [place]: 1402 Representative spoke with at [place]: Alyse Low  Prior Living Arrangements/Services Living arrangements for the past 2 months: [place] with:: Spouse Patient language and need for interpreter reviewed:: Yes Do you feel safe going back to the place where you live?: Yes      Need for Family Participation in Patient Care: No (Comment) Care giver support system in place?: Yes (comment) Current home services: Other (comment) (none) Criminal  Activity/Legal Involvement Pertinent to Current Situation/Hospitalization: No - Comment as needed  Activities of Daily Living Home Assistive Devices/Equipment: None ADL Screening (condition at time of admission) Patient's cognitive ability adequate to safely complete daily activities?: Yes Is the patient deaf or have difficulty hearing?: No Does the patient have difficulty seeing, even when wearing glasses/contacts?: No Does the patient have difficulty concentrating, remembering, or making decisions?: No Patient able to express need for assistance with ADLs?: Yes Does the patient have difficulty dressing or bathing?: No Independently performs ADLs?: Yes (appropriate for developmental age) Does the patient have difficulty walking or climbing stairs?: No Weakness of Legs: None Weakness of Arms/Hands: None  Permission Sought/Granted Permission sought to share information with : Family Supports Permission granted to share information with : Yes, Verbal Permission Granted  Share Information with NAME: husband Richardson Landry  Permission granted to share info w AGENCY: HH        Emotional Assessment Appearance:: Appears stated age Attitude/Demeanor/Rapport: Engaged Affect (typically observed): Appropriate, Pleasant Orientation: : Oriented to Self, Oriented to Place, Oriented to  Time, Oriented to Situation Alcohol / Substance Use: Not Applicable Psych Involvement: No (comment)  Admission diagnosis:  Hypercalcemia [E83.52] Dehydration [E86.0] Diverticulitis [K57.92] AKI (acute kidney injury) ([place]) [N17.9] Esophagitis [K20.90] Acute diverticulitis [K57.92] Patient Active Problem List   Diagnosis Date Noted   Acute diverticulitis 08/23/2020   Acute renal failure superimposed  on stage 3a chronic kidney disease (HCC) 08/23/2020   Hyponatremia 08/23/2020   Hyperbilirubinemia 08/23/2020   Hypercalcemia 08/23/2020   Essential hypertension, benign 10/01/2018   HLD (hyperlipidemia) 10/01/2018    Vitamin D deficiency disease 10/01/2018   Osteoarthritis 10/01/2018   PCP:  Pcp, No Pharmacy:   [id number] - [place], [place] - [address] [place] [address] Phone: [phone] Fax: [phone]  [facility] - [place], [place] - [address] [place]  [address] Phone: [phone] Fax: [phone]     Social Determinants of Health (SDOH) Interventions    Readmission Risk Interventions No flowsheet data found.   

## 2020-08-31 NOTE — Progress Notes (Signed)
Pharmacy Antibiotic Note  Anne Aguirre a 70 y.o. female admitted on 08/31/2020 with  intra abdominal infection .  Diverticulitis. Pharmacy consulted on 8/14 to change antibiotic to Ertapenem dosing. AKI on CKD IIIa. SCr 2.04 upon adm, Baseline ~1.  SCr decreased to <1, normalized with IVF hydration. Day # 8 total antibiotics.  Afebrile , WBC elevated.   Blood cultures are negative.  Ertapenem continued.   Plan:  Ertapenem 1 gm IV every 24 hours. No changed in dose necessary.  Monitor clinical status, renal function and culture results daily.   Medical History: Past Medical History:  Diagnosis Date   Essential hypertension, benign 10/01/2018   HLD (hyperlipidemia) 10/01/2018   Osteoarthritis 10/01/2018   Vitamin D deficiency disease 10/01/2018    Allergies:  No Known Allergies  Filed Weights   08/28/20 0500 08/29/20 0421 08/30/20 0920  Weight: 61.8 kg (136 lb 3.9 oz) 72.1 kg (158 lb 15.2 oz) 71.9 kg (158 lb 8.2 oz)    CBC Latest Ref Rng & Units 08/31/2020 08/30/2020 08/29/2020  WBC 4.0 - 10.5 K/uL 29.6(H) 17.5(H) 22.3(H)  Hemoglobin 12.0 - 15.0 g/dL 11.2(L) 11.3(L) 15.5(H)  Hematocrit 36.0 - 46.0 % 31.2(L) 31.2(L) 43.7  Platelets 150 - 400 K/uL 306 264 454(H)     Estimated Creatinine Clearance: 68.3 mL/min (by C-G formula based on SCr of 0.64 mg/dL).  Antibiotics Given (last 72 hours)     Date/Time Action Medication Dose Rate   08/28/20 1655 New Bag/Given   ertapenem (INVANZ) 1,000 mg in sodium chloride 0.9 % 100 mL IVPB 1,000 mg 200 mL/hr   08/29/20 1617 New Bag/Given   ertapenem (INVANZ) 1,000 mg in sodium chloride 0.9 % 100 mL IVPB 1,000 mg 200 mL/hr   08/30/20 0849 New Bag/Given   ertapenem (INVANZ) 1,000 mg in sodium chloride 0.9 % 100 mL IVPB 1,000 mg 200 mL/hr   08/31/20 1119 New Bag/Given   ertapenem (INVANZ) 1,000 mg in sodium chloride 0.9 % 100 mL IVPB 1,000 mg 200 mL/hr       Antimicrobials this admission: Cefepime 8/10 >> 8/14 Metronidazole 8/10  >>8/14 Ertapenem 8/14>>   Microbiology results: 08/23/20 BCx: Negative  08/23/20 MRSA PCR is neg 8/10 COVID: neg;  Flu: neg  Thank you for allowing pharmacy to be a part of this patient's care.  Noah Delaine, RPh Clinical Pharmacist [phone] 08/31/2020 11:49 AM Please check AMION for all [facility] Pharmacy phone numbers After 10:00 PM, call Main Pharmacy [phone]

## 2020-08-31 NOTE — Progress Notes (Deleted)
PHARMACY - TOTAL PARENTERAL NUTRITION CONSULT NOTE   Indication: Prolonged ileus  Patient Measurements: Height: 5\' 6"  (167.6 cm) Weight: 71.9 kg (158 lb 8.2 oz) IBW/kg (Calculated) : 59.3 TPN AdjBW (KG): 56.2 Body mass index is 25.58 kg/m. Usual Weight: ~60kg  Assessment: 17 YOF presenting with sigmoid diverticulitis, with perforation and abscess.  Now s/p Hartman's with colostomy, post-op ileus and consulted for initiation of TPN.    Glucose / Insulin: A1c 5 (03/2020), CBGs 85-106  not on insulin Electrolytes: K 3.7, Na 131, Mg 1.5 (2g ordered), Phos 2 (30 mmol K phos ordered) Renal: SCr 1.15, BUN 10 Hepatic: AST/ALT wnl, Tbili 0.9, TG 77 Intake / Output; MIVF: UOP 0.6 ml/kg/hr, Drain 70 mL, NS@75  GI Imaging: 8/10 CT abd: Sigmoid diverticulitis acute 8/12 CT abd: Developing abscess, possible rupture  GI Surgeries / Procedures:  8/14: IR drain attempt 8/16 Hartman's procedure w/ colostomy  Central access: for PICC placement today TPN start date: 8/17  Nutritional Goals: Awaiting RD recs for goal rate  RD Assessment:  Estimated Needs Total Energy Estimated Needs: 1600-1800 Total Protein Estimated Needs: 80-90 grams Total Fluid Estimated Needs: >1.6L/d  Current Nutrition:  NPO  Plan:  Increase TPN slowly to 45 mL/hr at 1800 (provides 54g AA, 1058 Kcal) Electrolytes in TPN: Inc Na 43mEq/L, K 50mEq/L, Ca 52mEq/L, Mg 24mEq/L, and Phos 77mmol/L. Cl:Ac 1:2 Add standard MVI and trace elements to TPN Initiate Sensitive q8h SSI and adjust as needed  Magnesium 2 gm IV x 1 Kphos 30 mmol IV x 1 Reduce MIVF to 60 mL/hr at 1800 TPN labs in AM  30m, PharmD, [facility] Clinical Pharmacist Please see AMION for all Pharmacists' Contact Phone Numbers 08/31/2020, 8:33 AM

## 2020-08-31 NOTE — Plan of Care (Signed)

## 2020-08-31 NOTE — Progress Notes (Signed)
Progress Note  2 Days Post-Op  Subjective: Getting ready to work with PT. She is having some pain around abdominal incision. She is voiding. She is on supplemental O2 but denies respiratory complaints. She is belching. She denies nausea or emesis.  Husband is bedside  Objective: Vital signs in last 24 hours: Temp:  [97.9 F (36.6 C)-98.4 F (36.9 C)] 98.4 F (36.9 C) (08/18 0800) Pulse Rate:  [77-92] 86 (08/18 0800) Resp:  [23-28] 23 (08/18 0800) BP: (120-131)/(63-82) 120/79 (08/18 0800) SpO2:  [97 %-100 %] 97 % (08/18 0800) Weight:  [71.9 kg] 71.9 kg (08/17 0920) Last BM Date: 08/28/20  Intake/Output from previous day: 08/17 0701 - 08/18 0700 In: 473 [I.V.:273; IV Piggyback:200] Out: 1020 [Urine:950; Drains:70] Intake/Output this shift: No intake/output data recorded.  PE: General: pleasant, WD, female who is sitting up in bed in NAD HEENT: head is normocephalic, atraumatic. Mouth is pink and moist Heart: regular rate Lungs: Respiratory effort nonlabored on supplemental O2 via [place] Abd: soft, bowel sounds are hypoactive. Midline dressing c/d/I. Stoma pink. Colostomy bag without stool or gas MSK: all 4 extremities are symmetrical with no cyanosis, clubbing, or edema. Skin: warm and dry with no masses, lesions, or rashes Neuro: Cranial nerves 2-12 grossly intact, sensation is normal throughout Psych: A&Ox3 with an appropriate affect.    Lab Results:  Recent Labs    08/30/20 0742 08/31/20 0645  WBC 17.5* 29.6*  HGB 11.3* 11.2*  HCT 31.2* 31.2*  PLT 264 306   BMET Recent Labs    08/30/20 0742 08/31/20 0645  NA 130* 131*  K 4.0 3.7  CL 104 106  CO2 18* 18*  GLUCOSE 106* 119*  BUN 13 13  CREATININE 0.91 0.64  CALCIUM 7.0* 7.4*   PT/INR No results for input(s): LABPROT, INR in the last 72 hours. CMP     Component Value Date/Time   NA 131 (L) 08/31/2020 0645   K 3.7 08/31/2020 0645   CL 106 08/31/2020 0645   CO2 18 (L) 08/31/2020 0645   GLUCOSE 119  (H) 08/31/2020 0645   BUN 13 08/31/2020 0645   CREATININE 0.64 08/31/2020 0645   CREATININE 1.01 (H) 04/04/2020 0000   CALCIUM 7.4 (L) 08/31/2020 0645   PROT 4.4 (L) 08/31/2020 0645   ALBUMIN 2.0 (L) 08/31/2020 0645   AST 24 08/31/2020 0645   ALT 16 08/31/2020 0645   ALKPHOS 34 (L) 08/31/2020 0645   BILITOT 0.9 08/31/2020 0645   GFRNONAA >60 08/31/2020 0645   GFRNONAA 57 (L) 04/04/2020 0000   GFRAA 66 04/04/2020 0000   Lipase     Component Value Date/Time   LIPASE 20 08/23/2020 1341       Studies/Results: [place] EKG SITE RITE  Result Date: 08/30/2020 If Site Rite image not attached, placement could not be confirmed due to current cardiac rhythm.   Anti-infectives: Anti-infectives (From admission, onward)    Start     Dose/Rate Route Frequency Ordered Stop   08/27/20 1530  ertapenem (INVANZ) 1,000 mg in sodium chloride 0.9 % 100 mL IVPB        1 g 200 mL/hr over 30 Minutes Intravenous Every 24 hours 08/27/20 1406     08/24/20 1145  ceFEPIme (MAXIPIME) 2 g in sodium chloride 0.9 % 100 mL IVPB  Status:  Discontinued        2 g 200 mL/hr over 30 Minutes Intravenous Every 12 hours 08/24/20 1051 08/27/20 1406   08/23/20 2100  ceFEPIme (MAXIPIME) 2 g in  sodium chloride 0.9 % 100 mL IVPB  Status:  Discontinued        2 g 200 mL/hr over 30 Minutes Intravenous Every 24 hours 08/23/20 2051 08/24/20 1051   08/23/20 1700  ciprofloxacin (CIPRO) IVPB 400 mg  Status:  Discontinued        400 mg 200 mL/hr over 60 Minutes Intravenous Every 12 hours 08/23/20 1645 08/23/20 2041   08/23/20 1700  metroNIDAZOLE (FLAGYL) IVPB 500 mg  Status:  Discontinued        500 mg 100 mL/hr over 60 Minutes Intravenous Every 8 hours 08/23/20 1645 08/27/20 1406        Assessment/Plan Diverticulitis with abscess POD#2 s/p ex lap and Hartman's procedure with colostomy by AR 8/16 - JP drain with 70 ml serosanguinous fluid - WBC up to 29.6 from 17.5, afebrile - has received iv tylenol. Continue to  monitor -TNA started 8/17 -continue IV abx - magnesium and phosphate repletion has been ordered  FEN: NPO, TNA ID: ertapenem 8/14> VTE: scds, lovenox Foley: out 8/17, voiding  HTN AKI   LOS: 8 days    Eric Form, [facility] 08/31/2020, 9:07 AM Please see Amion for pager number during day hours 7:00am-4:30pm

## 2020-08-31 NOTE — Consult Note (Signed)
WOC Nurse ostomy follow up Stoma type/location: LUQ; end colostomy Stomal assessment/size: 7/8" x 1" slightly oval shaped Peristomal assessment: intact, dips in the skin at 3 and 9 o'clock  Treatment options for stomal/peristomal skin: 2" skin barrier ring; may need to add belt as well  Output bloody; no stool  Ostomy pouching: 1pc./2pc.  Education provided:  Explained role of ostomy nurse and creation of stoma  Explained stoma characteristics (budded, flush, color, texture, care) Demonstrated pouch change (cutting new skin barrier, measuring stoma, cleaning peristomal skin and stoma, use of barrier ring) Education on emptying when 1/3 to 1/2 full and how to empty Demonstrated use of wick to clean spout    Enrolled patient in [employer] Discharge program: Yes  WOC Nurse will follow along with you for continued support with ostomy teaching and care Adynn Caseres [facility] MSN, RN, [place], CNS, [place] [phone]

## 2020-08-31 NOTE — Progress Notes (Signed)
PHARMACY - TOTAL PARENTERAL NUTRITION CONSULT NOTE   Indication: Prolonged ileus  Patient Measurements: Height: 5\' 6"  (167.6 cm) Weight: 71.9 kg (158 lb 8.2 oz) IBW/kg (Calculated) : 59.3 TPN AdjBW (KG): 56.2 Body mass index is 25.58 kg/m. Usual Weight: ~60kg  Assessment: 72 YOF presenting with sigmoid diverticulitis, with perforation and abscess.  Now s/p Hartman's with colostomy, post-op ileus and consulted for initiation of TPN.    Glucose / Insulin: A1c 5 (03/2020), CBGs 85-106  not on insulin Electrolytes: K 3.7, Na 131, Mg 1.5 (2g ordered), Phos 2 (30 mmol K phos ordered) Renal: SCr 1.15, BUN 10 Hepatic: AST/ALT wnl, Tbili 0.9, TG 77 Intake / Output; MIVF: UOP 0.6 ml/kg/hr, Drain 70 mL, NS@75  GI Imaging: 8/10 CT abd: Sigmoid diverticulitis acute 8/12 CT abd: Developing abscess, possible rupture  GI Surgeries / Procedures:  8/14: IR drain attempt 8/16 Hartman's procedure w/ colostomy  Central access: for PICC placement today TPN start date: 8/17  Nutritional Goals: Awaiting RD recs for goal rate  RD Assessment:  Estimated Needs Total Energy Estimated Needs: 1600-1800 Total Protein Estimated Needs: 80-90 grams Total Fluid Estimated Needs: >1.6L/d  Current Nutrition:  NPO  Plan:  Increase TPN slowly to 45 mL/hr at 1800 (provides 54g AA, 1058 Kcal) Electrolytes in TPN: Inc Na 57mEq/L, K 83mEq/L, Ca 31mEq/L, Mg 61mEq/L, and Phos 44mmol/L. Cl:Ac 1:2 Add standard MVI and trace elements to TPN Initiate Sensitive q8h SSI and adjust as needed  Magnesium 2 gm IV x 1 Kphos 30 mmol IV x 1  MIVF should be turned off at 1130 today TPN labs in AM  30m, PharmD, [facility] Clinical Pharmacist Please see AMION for all Pharmacists' Contact Phone Numbers 08/31/2020, 8:36 AM

## 2020-08-31 NOTE — Plan of Care (Signed)
  Problem: Education: Goal: Knowledge of General Education information will improve Description: Including pain rating scale, medication(s)/side effects and non-pharmacologic comfort measures Outcome: Progressing   Problem: Activity: Goal: Risk for activity intolerance will decrease Outcome: Progressing   Problem: Coping: Goal: Level of anxiety will decrease Outcome: Progressing   

## 2020-09-01 DIAGNOSIS — K5792 Diverticulitis of intestine, part unspecified, without perforation or abscess without bleeding: Secondary | ICD-10-CM | POA: Diagnosis not present

## 2020-09-01 DIAGNOSIS — I10 Essential (primary) hypertension: Secondary | ICD-10-CM | POA: Diagnosis not present

## 2020-09-01 DIAGNOSIS — N179 Acute kidney failure, unspecified: Secondary | ICD-10-CM | POA: Diagnosis not present

## 2020-09-01 LAB — CBC
HCT: 28.8 % — ABNORMAL LOW (ref 36.0–46.0)
Hemoglobin: 10.2 g/dL — ABNORMAL LOW (ref 12.0–15.0)
MCH: 32.6 pg (ref 26.0–34.0)
MCHC: 35.4 g/dL (ref 30.0–36.0)
MCV: 92 fL (ref 80.0–100.0)
Platelets: 205 10*3/uL (ref 150–400)
RBC: 3.13 MIL/uL — ABNORMAL LOW (ref 3.87–5.11)
RDW: 13.7 % (ref 11.5–15.5)
WBC: 24.6 10*3/uL — ABNORMAL HIGH (ref 4.0–10.5)
nRBC: 0 % (ref 0.0–0.2)

## 2020-09-01 LAB — GLUCOSE, CAPILLARY
Glucose-Capillary: 142 mg/dL — ABNORMAL HIGH (ref 70–99)
Glucose-Capillary: 155 mg/dL — ABNORMAL HIGH (ref 70–99)
Glucose-Capillary: 116 mg/dL — ABNORMAL HIGH (ref 70–99)
Glucose-Capillary: 147 mg/dL — ABNORMAL HIGH (ref 70–99)

## 2020-09-01 LAB — BASIC METABOLIC PANEL
Anion gap: 7 (ref 5–15)
BUN: 11 mg/dL (ref 8–23)
CO2: 20 mmol/L — ABNORMAL LOW (ref 22–32)
Calcium: 7.3 mg/dL — ABNORMAL LOW (ref 8.9–10.3)
Chloride: 106 mmol/L (ref 98–111)
Creatinine, Ser: 0.58 mg/dL (ref 0.44–1.00)
GFR, Estimated: >60 mL/min (ref >60)
Glucose, Bld: 150 mg/dL — ABNORMAL HIGH (ref 70–99)
Potassium: 4.1 mmol/L (ref 3.5–5.1)
Sodium: 133 mmol/L — ABNORMAL LOW (ref 135–145)

## 2020-09-01 LAB — PHOSPHORUS: Phosphorus: 2.3 mg/dL — ABNORMAL LOW (ref 2.5–4.6)

## 2020-09-01 LAB — MAGNESIUM: Magnesium: 1.3 mg/dL — ABNORMAL LOW (ref 1.7–2.4)

## 2020-09-01 LAB — SARS CORONAVIRUS 2 (TAT 6-24 HRS): SARS Coronavirus 2: POSITIVE — AB

## 2020-09-01 MED ORDER — SODIUM PHOSPHATES 45 MMOLE/15ML IV SOLN
20.0000 mmol | Freq: Once | INTRAVENOUS | Status: AC
  Administered 2020-09-01: 20 mmol via INTRAVENOUS
  Filled 2020-09-01: qty 6.67

## 2020-09-01 MED ORDER — ALBUTEROL SULFATE HFA 108 (90 BASE) MCG/ACT IN AERS
2.0000 | INHALATION_SPRAY | RESPIRATORY_TRACT | Status: DC | PRN
  Filled 2020-09-01: qty 6.7

## 2020-09-01 MED ORDER — FUROSEMIDE 10 MG/ML IJ SOLN
40.0000 mg | Freq: Once | INTRAMUSCULAR | Status: AC
  Administered 2020-09-01: 40 mg via INTRAVENOUS
  Filled 2020-09-01: qty 4

## 2020-09-01 MED ORDER — TRACE MINERALS CU-MN-SE-ZN 300-55-60-3000 MCG/ML IV SOLN
INTRAVENOUS | Status: AC
  Filled 2020-09-01: qty 660

## 2020-09-01 MED ORDER — MAGNESIUM SULFATE 4 GM/100ML IV SOLN
4.0000 g | Freq: Once | INTRAVENOUS | Status: AC
  Administered 2020-09-01: 4 g via INTRAVENOUS
  Filled 2020-09-01: qty 100

## 2020-09-01 NOTE — Progress Notes (Signed)
PHARMACY - TOTAL PARENTERAL NUTRITION CONSULT NOTE   Indication: Prolonged ileus  Patient Measurements: Height: 5\' 6"  (167.6 cm) Weight: 73.5 kg (162 lb 0.6 oz) IBW/kg (Calculated) : 59.3 TPN AdjBW (KG): 56.2 Body mass index is 26.15 kg/m. Usual Weight: ~60kg  Assessment: 22 YOF presenting with sigmoid diverticulitis, with perforation and abscess.  Now s/p Hartman's with colostomy, post-op ileus and consulted for initiation of TPN.    Glucose / Insulin: A1c 5 (03/2020), CBGs 133-150  not on insulin Electrolytes: K 4.1, Na 133, Mg 1.3 (4g ordered), Phos 2.3 (20 mmol Na phos ordered); CoCa 8.9 Renal: SCr 0.58, BUN 11 Hepatic: AST/ALT wnl, Tbili 0.9, TG 77 Intake / Output; MIVF: UOP 0.2 ml/kg/hr, Drain 40 mL GI Imaging: 8/10 CT abd: Sigmoid diverticulitis acute 8/12 CT abd: Developing abscess, possible rupture  GI Surgeries / Procedures:  8/14: IR drain attempt 8/16 Hartman's procedure w/ colostomy  Central access: for PICC placement today TPN start date: 8/17  Nutritional Goals: RD Assessment:  Estimated Needs Total Energy Estimated Needs: 1600-1800 Total Protein Estimated Needs: 80-90 grams Total Fluid Estimated Needs: >1.6L/d  Goal rate 70 ml/hr to provide 84 gm protein, 252 gm CHO, 50 gm lipid which provides 1642 Kcal  Current Nutrition:  NPO  Plan:  Increase TPN slowly to 55 mL/hr at 1800 (provides 66g AA, 1293Kcal) because patient appears to be having some mild refeeding Electrolytes in TPN:  Na 53mEq/L, K 62mEq/L, Ca 24mEq/L, Incr Mg 74mEq/L, and Incr Phos 22 mmol/L. Cl:Ac 1:2 Add standard MVI and trace elements to TPN Initiate Sensitive q8h SSI and adjust as needed  Magnesium 4 gm IV x 1 Naphos 20 mmol IV x 1 BMP, Mag and Phos TPN labs in AM  10m, PharmD, [facility] Clinical Pharmacist Please see AMION for all Pharmacists' Contact Phone Numbers 09/01/2020, 7:07 AM

## 2020-09-01 NOTE — Progress Notes (Signed)
Progress Note  3 Days Post-Op  Subjective: Patient states she doesn't feel well today secondary to pain and SOB.  States husband just tested positive for COVID.  She states she is "feeling trapped"  when I try to differentiate out her symptoms it is difficult and as she seems very anxious and is tachypnic.  She has been having some breathing issues as she got lasix yesterday for some SOB it appears.  She states her nausea is improving.  No colostomy function yet.     Objective: Vital signs in last 24 hours: Temp:  [97.9 F (36.6 C)-98.8 F (37.1 C)] 97.9 F (36.6 C) (08/19 0405) Pulse Rate:  [86-106] 98 (08/19 0524) Resp:  [18-27] 19 (08/19 0524) BP: (117-179)/(65-98) 140/82 (08/19 0524) SpO2:  [94 %-98 %] 98 % (08/19 0524) Weight:  [73.5 kg] 73.5 kg (08/19 0500) Last BM Date: 08/28/20  Intake/Output from previous day: 08/18 0701 - 08/19 0700 In: 1067.1 [I.V.:552.9; IV Piggyback:514.3] Out: 340 [Urine:300; Drains:40] Intake/Output this shift: No intake/output data recorded.  PE: General: anxious Heart: tachy in 110s Lungs: sound a bit wet, but overall clear.  Tachypnic in high 20s to low 30s.  Sats around 93% on 1.5L Abd: soft, appropriately tender, midline wound is clean and packed.  Colostomy with pink stoma and no output yet.  JP is SS   Lab Results:  Recent Labs    08/31/20 0645 09/01/20 0421  WBC 29.6* 24.6*  HGB 11.2* 10.2*  HCT 31.2* 28.8*  PLT 306 205   BMET Recent Labs    08/31/20 0645 09/01/20 0421  NA 131* 133*  K 3.7 4.1  CL 106 106  CO2 18* 20*  GLUCOSE 119* 150*  BUN 13 11  CREATININE 0.64 0.58  CALCIUM 7.4* 7.3*   PT/INR No results for input(s): LABPROT, INR in the last 72 hours. CMP     Component Value Date/Time   NA 133 (L) 09/01/2020 0421   K 4.1 09/01/2020 0421   CL 106 09/01/2020 0421   CO2 20 (L) 09/01/2020 0421   GLUCOSE 150 (H) 09/01/2020 0421   BUN 11 09/01/2020 0421   CREATININE 0.58 09/01/2020 0421   CREATININE 1.01  (H) 04/04/2020 0000   CALCIUM 7.3 (L) 09/01/2020 0421   PROT 4.4 (L) 08/31/2020 0645   ALBUMIN 2.0 (L) 08/31/2020 0645   AST 24 08/31/2020 0645   ALT 16 08/31/2020 0645   ALKPHOS 34 (L) 08/31/2020 0645   BILITOT 0.9 08/31/2020 0645   GFRNONAA >60 09/01/2020 0421   GFRNONAA 57 (L) 04/04/2020 0000   GFRAA 66 04/04/2020 0000   Lipase     Component Value Date/Time   LIPASE 20 08/23/2020 1341       Studies/Results: DG CHEST PORT 1 VIEW  Result Date: 08/31/2020 CLINICAL DATA:  Shortness of breath EXAM: PORTABLE CHEST 1 VIEW COMPARISON:  Chest x-ray dated August 23, 2020 FINDINGS: Right arm PICC with tip positioned over the expected area of the upper right atrium. Low lung volumes with bibasilar opacities, likely combination of atelectasis and pleural effusions. No evidence of pneumothorax. Visualized cardiac and mediastinal contours are within normal limits. Lucency overlying the left upper quadrant, likely postoperative pneumoperitoneum. IMPRESSION: Right arm PICC with tip positioned over the expected area of the upper right atrium. Low lung volumes with bibasilar opacities, likely combination of atelectasis and pleural effusions. Electronically Signed   By: Allegra Lai M.D.   On: 08/31/2020 13:45   [place] EKG SITE RITE  Result Date: 08/30/2020  If [employer] not attached, placement could not be confirmed due to current cardiac rhythm.   Anti-infectives: Anti-infectives (From admission, onward)    Start     Dose/Rate Route Frequency Ordered Stop   08/27/20 1530  ertapenem (INVANZ) 1,000 mg in sodium chloride 0.9 % 100 mL IVPB        1 g 200 mL/hr over 30 Minutes Intravenous Every 24 hours 08/27/20 1406     08/24/20 1145  ceFEPIme (MAXIPIME) 2 g in sodium chloride 0.9 % 100 mL IVPB  Status:  Discontinued        2 g 200 mL/hr over 30 Minutes Intravenous Every 12 hours 08/24/20 1051 08/27/20 1406   08/23/20 2100  ceFEPIme (MAXIPIME) 2 g in sodium chloride 0.9 % 100 mL IVPB   Status:  Discontinued        2 g 200 mL/hr over 30 Minutes Intravenous Every 24 hours 08/23/20 2051 08/24/20 1051   08/23/20 1700  ciprofloxacin (CIPRO) IVPB 400 mg  Status:  Discontinued        400 mg 200 mL/hr over 60 Minutes Intravenous Every 12 hours 08/23/20 1645 08/23/20 2041   08/23/20 1700  metroNIDAZOLE (FLAGYL) IVPB 500 mg  Status:  Discontinued        500 mg 100 mL/hr over 60 Minutes Intravenous Every 8 hours 08/23/20 1645 08/27/20 1406        Assessment/Plan POD#3 s/p ex lap and Hartman's procedure with colostomy by AR 8/16 for diverticulitis with abscess - JP drain with 40 ml serosanguinous fluid - WBC down to 24K today.  On Invanz -cont NPO and await resolution of ileus -TNA started 8/17 -IS/pulm toilet -mobilize  FEN: NPO, TNA, lasix today again per medicine ID: ertapenem 8/14> VTE: scds, lovenox Foley: out 8/17, voiding  HTN AKI COVID exposure from husband - COVID test pending.  Patient expresses symptoms but also seems very anxious.  CXR last night did not show evidence of PNA etc.   LOS: 9 days    Letha Cape, [facility] Surgery 09/01/2020, 7:57 AM Please see Amion for pager number during day hours 7:00am-4:30pm

## 2020-09-01 NOTE — Consult Note (Signed)
WOC by to check on patient's status. She is COVID test pending, exposure from husband who tested positive yesterday. She is anxious and tachypnic and anxious today. She is not really able to participate in care. Her husband is not present due to COVID.  She will most likely be dependent on her based on our first teaching session. No output from stoma, stoma is very small. Will mention to CCS about potential for stricture. Follow up Monday for additional teaching with patient.   Extra supplies in the room  May need ostomy belt due to dips in the skin at 3 and 9 o'clock  WOC Nurse will follow along with you for continued support with ostomy teaching and care Minola Guin [facility] MSN, RN, [place], CNS, [place] [phone]

## 2020-09-01 NOTE — Progress Notes (Signed)
PROGRESS NOTE   Anne Aguirre  UKG:[id number] DOB: Apr 15, 1950 DOA: 08/23/2020 PCP: Pcp, No    Brief Narrative:  70 y.o. female with past medical history of hypertension presented to the hospital with abdominal pain, nausea, vomiting and cramps.  Patient was constipated prior to presentation.  Patient was unable to tolerate any food.  In the ED, patient was noted to have a diverticulitis with developing abscess.  Patient was then considered for admission to [facility] for further IR intervention and surgical follow-up.    During hospitalization, patient was seen by interventional radiology but there was no drainable abscess so he was treated initially conservatively with Invanz and general surgery followed-up.  Patient however persisted to have increasing abdominal pain persistent leukocytosis and elevated lactate, so patient underwent a Hartmann procedure on  on 08/29/2020.  Postoperatively, patient has been been having some difficulty breathing and persist to have ileus.   Assessment & Plan:   Principal Problem:   Acute diverticulitis Active Problems:   Essential hypertension, benign   Acute renal failure superimposed on stage 3a chronic kidney disease (HCC)   Hyponatremia   Hyperbilirubinemia   Hypercalcemia  Acute sigmoid diverticulitis,  with perforation and peritonitis with severe sepsis present on admission Status post colonic resection and colostomy  due to worsening abdominal symptoms.  Postop day 3 today.  General surgery on board.  On  Invanz.  Still significantly elevated leukocytosis.  Has been started on TPN for possibility of prolonged ileus..  Continue NPO.  Continue TPN.  Spoke with general surgery.  Dyspnea with anxiety.  Chest x-ray yesterday showed basilar atelectasis/effusion.  Received 1 dose of 20 mEq of Lasix yesterday.  We will give 1 dose of IV Lasix 40 mEq.  Encourage incentive spirometry, ambulation, deep breathing.  Added albuterol inhaler.  Patient is  overall positive fluid balance.  Patient's husband has been tested positive for COVID so we will do COVID testing.  Postoperative ileus, on TPN at this time.  Continue to monitor closely.  Postoperative pain. on morphine , muscle relaxant.  Controlled   Acute kidney injury superimposed on CKD IIIa  Improved.  Latest creatinine of 0.5  Hypokalemia.  Improved after K-Phos.  Latest potassium of 4.1.  Hypophosphatemia.   Latest phosphate of 2.3.    Hypomagnesemia.  We will replenish IV magnesium sulfate.  Check levels and again in AM.  Hyponatremia  Mild.  Improving.  We will continue to monitor.  Hypocalcemia.   No spasms or symptoms.  We will continue to monitor.   Hyperbilirubinemia  Improved at this time.  Liver cyst on CT scan of the abdomen.  Essential hypertension  Antihypertensives on hold at this time.  Patient is on amlodipine, Avapro and nadolol at home.  Blood pressure currently stable.   DVT prophylaxis: SCD's  Code Status:  Full code  Family Communication:  None today, I spoke with the patient's husband at bedside yesterday..  Status is: Inpatient  Remains inpatient appropriate because:Inpatient level of care appropriate due to severity of illness, on IV antibiotics, status post surgical intervention, expected ileus on TPN Dispo: The patient is from: Home              Anticipated d/c is to: Home              Patient currently is not medically stable to d/c.   Difficult to place patient No   Consultants:  General Surgery IR  Procedures:  Status post Hartmann procedure with colostomy on  08/29/2020  Antimicrobials: Ertapenem  Subjective: Today, patient was seen and examined at bedside.  Complains of difficulty breathing and anxiety.  Patient's husband had tested positive for COVID yesterday.  Denies any nausea or vomiting.  Has not had a bowel movement or flatus for  Objective:  Vitals:   09/01/20 0501 09/01/20 0524 09/01/20 0809 09/01/20 1158  BP:   140/82 140/79 128/88  Pulse:  98 93 89  Resp: (!) 22 19 20 19   Temp:   99.6 F (37.6 C) 98.9 F (37.2 C)  TempSrc:   Oral Oral  SpO2: 95% 98% 94% 95%  Weight:      Height:        Intake/Output Summary (Last 24 hours) at 09/01/2020 1305 Last data filed at 09/01/2020 [id number] Gross per 24 hour  Intake 1067.14 ml  Output 340 ml  Net 727.14 ml    Filed Weights   08/29/20 0421 08/30/20 0920 09/01/20 0500  Weight: 72.1 kg 71.9 kg 73.5 kg   Body mass index is 26.15 kg/m.   Physical examination: General:  Average built, mildly anxious, mildly tachypneic, on nasal cannula oxygen HENT:   No scleral pallor or icterus noted. Oral mucosa is moist.  Chest: Diminished breath sounds bilaterally.  Mildly tachypneic, coarse breath sounds noted. CVS: S1 &S2 heard. No murmur.  Regular rate and rhythm. Abdomen: Soft, abdominal wound with dressing and colostomy.  Bowel sounds absent. Extremities: No cyanosis, clubbing or edema.  Peripheral pulses are palpable. Psych: Alert, awake and oriented, mildly anxious CNS:  No cranial nerve deficits.  Power equal in all extremities.   Skin: Warm and dry.  Abdominal wound with dressing  Data Reviewed: I have personally reviewed following labs and imaging studies  CBC: Recent Labs  Lab 08/28/20 1103 08/29/20 0406 08/30/20 0742 08/31/20 0645 09/01/20 0421  WBC 25.3* 22.3* 17.5* 29.6* 24.6*  HGB 14.2 15.5* 11.3* 11.2* 10.2*  HCT 40.5 43.7 31.2* 31.2* 28.8*  MCV 92.9 89.9 90.7 90.7 92.0  PLT 398 454* 264 306 205    Basic Metabolic Panel: Recent Labs  Lab 08/28/20 2200 08/29/20 0406 08/30/20 0742 08/31/20 0645 09/01/20 0421  NA 129* 129* 130* 131* 133*  K 3.1* 3.2* 4.0 3.7 4.1  CL 101 104 104 106 106  CO2 12* 12* 18* 18* 20*  GLUCOSE 98 89 106* 119* 150*  BUN 7* 10 13 13 11   CREATININE 0.83 1.15* 0.91 0.64 0.58  CALCIUM 6.7* 6.9* 7.0* 7.4* 7.3*  MG 1.5* 1.4* 1.5* 1.5* 1.3*  PHOS 1.7* 2.3* 3.4 2.0* 2.3*    GFR: Estimated Creatinine  Clearance: 69.1 mL/min (by C-G formula based on SCr of 0.58 mg/dL). Liver Function Tests: Recent Labs  Lab 08/29/20 0406 08/30/20 0742 08/31/20 0645  AST 28 26 24   ALT 18 14 16   ALKPHOS 49 23* 34*  BILITOT 1.9* 1.2 0.9  PROT 5.1* 3.8* 4.4*  ALBUMIN 2.0* 2.0* 2.0*    No results for input(s): LIPASE, AMYLASE in the last 168 hours.  No results for input(s): AMMONIA in the last 168 hours. Coagulation Profile: Recent Labs  Lab 08/27/20 0456  INR 1.3*    Cardiac Enzymes: No results for input(s): CKTOTAL, CKMB, CKMBINDEX, TROPONINI in the last 168 hours. BNP (last 3 results) No results for input(s): PROBNP in the last 8760 hours. HbA1C: No results for input(s): HGBA1C in the last 72 hours. CBG: Recent Labs  Lab 08/31/20 0407 08/31/20 1723 08/31/20 2144 09/01/20 0548 09/01/20 1159  GLUCAP 105* 137* 133* 142* 155*  Lipid Profile: Recent Labs    08/31/20 0645  TRIG 77    Thyroid Function Tests: No results for input(s): TSH, T4TOTAL, FREET4, T3FREE, THYROIDAB in the last 72 hours. Anemia Panel: No results for input(s): VITAMINB12, FOLATE, FERRITIN, TIBC, IRON, RETICCTPCT in the last 72 hours. Sepsis Labs: Recent Labs  Lab 08/26/20 0935 08/26/20 1226 08/28/20 2143 08/29/20 0925  LATICACIDVEN 1.2 1.4 2.5* 2.6*     Recent Results (from the past 240 hour(s))  Resp Panel by RT-PCR (Flu A&B, Covid) Nasopharyngeal Swab     Status: None   Collection Time: 08/23/20  3:35 PM   Specimen: Nasopharyngeal Swab; Nasopharyngeal(NP) swabs in vial transport medium  Result Value Ref Range Status   SARS Coronavirus 2 by RT PCR NEGATIVE NEGATIVE Final    Comment: (NOTE) SARS-CoV-2 target nucleic acids are NOT DETECTED.  The SARS-CoV-2 RNA is generally detectable in upper respiratory specimens during the acute phase of infection. The lowest concentration of SARS-CoV-2 viral copies this assay can detect is 138 copies/mL. A negative result does not preclude  SARS-Cov-2 infection and should not be used as the sole basis for treatment or other patient management decisions. A negative result may occur with  improper specimen collection/handling, submission of specimen other than nasopharyngeal swab, presence of viral mutation(s) within the areas targeted by this assay, and inadequate number of viral copies(<138 copies/mL). A negative result must be combined with clinical observations, patient history, and epidemiological information. The expected result is Negative.  Fact Sheet for Patients:  BloggerCourse.com  Fact Sheet for Healthcare Providers:  SeriousBroker.it  This test is no t yet approved or cleared by the [place] FDA and  has been authorized for detection and/or diagnosis of SARS-CoV-2 by FDA under an Emergency Use Authorization (EUA). This EUA will remain  in effect (meaning this test can be used) for the duration of the COVID-19 declaration under Section 564(b)(1) of the Act, 21 U.S.C.section 360bbb-3(b)(1), unless the authorization is terminated  or revoked sooner.       Influenza A by PCR NEGATIVE NEGATIVE Final   Influenza B by PCR NEGATIVE NEGATIVE Final    Comment: (NOTE) The Xpert Xpress SARS-CoV-2/FLU/RSV plus assay is intended as an aid in the diagnosis of influenza from Nasopharyngeal swab specimens and should not be used as a sole basis for treatment. Nasal washings and aspirates are unacceptable for Xpert Xpress SARS-CoV-2/FLU/RSV testing.  Fact Sheet for Patients: BloggerCourse.com  Fact Sheet for Healthcare Providers: SeriousBroker.it  This test is not yet approved or cleared by the [place] FDA and has been authorized for detection and/or diagnosis of SARS-CoV-2 by FDA under an Emergency Use Authorization (EUA). This EUA will remain in effect (meaning this test can be used) for the duration of  the COVID-19 declaration under Section 564(b)(1) of the Act, 21 U.S.C. section 360bbb-3(b)(1), unless the authorization is terminated or revoked.  Performed at [facility], [address] [place], [place] [address]   Culture, blood (routine x 2)     Status: None   Collection Time: 08/23/20  8:07 PM   Specimen: BLOOD RIGHT ARM  Result Value Ref Range Status   Specimen Description BLOOD RIGHT ARM  Final   Special Requests   Final    Blood Culture results may not be optimal due to an inadequate volume of blood received in culture bottles BOTTLES DRAWN AEROBIC AND ANAEROBIC   Culture   Final    NO GROWTH 5 DAYS Performed at [facility], [address]  [place], [place] [address]    Report Status 08/28/2020 FINAL  Final  MRSA Next Gen by PCR, Nasal     Status: None   Collection Time: 08/24/20  3:43 AM   Specimen: Nasal Mucosa; Nasal Swab  Result Value Ref Range Status   MRSA by PCR Next Gen NOT DETECTED NOT DETECTED Final    Comment: (NOTE) The GeneXpert MRSA Assay (FDA approved for NASAL specimens only), is one component of a comprehensive MRSA colonization surveillance program. It is not intended to diagnose MRSA infection nor to guide or monitor treatment for MRSA infections. Test performance is not FDA approved in patients less than 27 years old. Performed at [facility], [address] [place], [place] [address]       Radiology Studies: DG CHEST PORT 1 VIEW  Result Date: 08/31/2020 CLINICAL DATA:  Shortness of breath EXAM: PORTABLE CHEST 1 VIEW COMPARISON:  Chest x-ray dated August 23, 2020 FINDINGS: Right arm PICC with tip positioned over the expected area of the upper right atrium. Low lung volumes with bibasilar opacities, likely combination of atelectasis and pleural effusions. No evidence of pneumothorax. Visualized cardiac and mediastinal contours are within normal limits. Lucency overlying the left upper quadrant, likely postoperative pneumoperitoneum. IMPRESSION: Right  arm PICC with tip positioned over the expected area of the upper right atrium. Low lung volumes with bibasilar opacities, likely combination of atelectasis and pleural effusions. Electronically Signed   By: Allegra Lai M.D.   On: 08/31/2020 13:45    Scheduled Meds:  Chlorhexidine Gluconate Cloth  6 each Topical Daily   enoxaparin (LOVENOX) injection  40 mg Subcutaneous Q24H   insulin aspart  0-6 Units Subcutaneous Q8H   pantoprazole (PROTONIX) IV  40 mg Intravenous Q24H   sodium chloride flush  10-40 mL Intracatheter Q12H   sodium chloride flush  10-40 mL Intracatheter Q12H   Continuous Infusions:  ertapenem 1,000 mg (09/01/20 1029)   methocarbamol (ROBAXIN) IV 1,000 mg (09/01/20 0045)   promethazine (PHENERGAN) injection (IM or IVPB)     sodium phosphate  Dextrose 5% IVPB 20 mmol (09/01/20 1113)   TPN ADULT (ION) 45 mL/hr at 08/31/20 1705   TPN ADULT (ION)       LOS: 9 days   Joycelyn Das, MD [facility] If 7PM-7AM, please contact night-coverage 09/01/2020, 1:05 PM

## 2020-09-01 NOTE — Progress Notes (Addendum)
Pt's Husband called this morning stating that he tested positive for COVID and is showing symptoms. On call MD was notified about testing the patient for Covid. Waiting for MD to call with an order. Droplet protocol initiated.

## 2020-09-01 NOTE — Progress Notes (Signed)
Occupational Therapy Evaluation Patient Details Name: Anne Aguirre MRN: [id number] DOB: 08/21/1950 Today's Date: 09/01/2020    History of Present Illness 70 y.o. female presented 08/23/20 to the emergency department for evaluation of abdominal pain, nausea, and vomiting. 8/12 CT abd +abscess in pelvis; 8/14 Attempt at CT guided aspiration/drainage of pelvic abscess; 8/16 exp lap with colostomy due to perforated diverticulitis PMH-HTN   Clinical Impression   Pt presents with above diagnosis. PTA pt PLOF living at home with spouse, I with ADLs and IADLs, still driving and retired. Pt is currently limited with safe ADL engagement due to pain, decreased strength, limited functional mobility, and decreased functional engagement. Pt will benefit with acute level OT to address AE education, HEP, functional stability, safety awareness, compensatory strategies, and energy conservation tecniques to maximize independence prior to DC to home setting with HHOT and supervision.     Follow Up Recommendations  Home health OT    Equipment Recommendations  3 in 1 bedside commode    Recommendations for Other Services       Precautions / Restrictions Precautions Precautions: Fall      Mobility Bed Mobility Overal bed mobility: Needs Assistance Bed Mobility: Rolling;Sidelying to Sit Rolling: Min assist         General bed mobility comments: using rail; step by step sequencing for rolling, pt required assist to rotate pelvis for completion of rolling on B sides.    Transfers Overall transfer level: Needs assistance               General transfer comment: EOB/OOB engagement declined, will further assess.    Balance Overall balance assessment: Needs assistance Sitting-balance support: No upper extremity supported;Feet supported Sitting balance-Leahy Scale: Fair     Standing balance support: Single extremity supported Standing balance-Leahy Scale: Poor                              ADL either performed or assessed with clinical judgement   ADL Overall ADL's : Needs assistance/impaired Eating/Feeding: Set up;Bed level     Grooming Details (indicate cue type and reason): Not formally assessed, however, will infer set up to min guard due to pain and limitations with movement.         Upper Body Dressing : Set up;Bed level     Lower Body Dressing Details (indicate cue type and reason): Not assessed, due to pt declining EOB mobility at this time. Pt will required assistance with LB dressing due to pain from procedure. Pt is limited with positioning.   Toilet Transfer Details (indicate cue type and reason): Pt declined OOB activity due to pain and fatigue from prior mobility. Attempted to encourage but pt continues to decline efforts.           General ADL Comments: Pt is concerned for functional stability and safety in standing to perform ADLs tasks, such as grooming, toileting/toilet transfer, and functional mobility. Will further assess seated ADL function once pt can tolerate activities.     Vision         Perception     Praxis      Pertinent Vitals/Pain Pain Assessment: 0-10 Pain Score: 7  Pain Location: abd Pain Descriptors / Indicators: Constant;Discomfort;Operative site guarding Pain Intervention(s): Limited activity within patient's tolerance;Monitored during session     Hand Dominance Right   Extremity/Trunk Assessment Upper Extremity Assessment Upper Extremity Assessment: Overall WFL for tasks assessed   Lower Extremity Assessment Lower Extremity  Assessment: Defer to PT evaluation   Cervical / Trunk Assessment Cervical / Trunk Assessment: Other exceptions Cervical / Trunk Exceptions: new colostomy   Communication Communication Communication: No difficulties   Cognition Arousal/Alertness: Awake/alert Behavior During Therapy: WFL for tasks assessed/performed Overall Cognitive Status: Within Functional Limits for tasks  assessed                                     General Comments  Pending covid results, husband reports testing positive for Covid this AM. Currenlty on airbourne and contact precautions. Pt on RA with O2 sats at 96%    Exercises     Shoulder Instructions      Home Living Family/patient expects to be discharged to:: Private residence Living Arrangements: Spouse/significant other Available Help at Discharge: Family;Available 24 hours/day Type of Home: House Home Access: Ramped entrance     Home Layout: One level     Bathroom Shower/Tub: Producer, television/film/video: Standard Bathroom Accessibility: Yes   Home Equipment: None;Grab bars - tub/shower;Grab bars - toilet          Prior Functioning/Environment Level of Independence: Needs assistance  Gait / Transfers Assistance Needed: independent no device ADL's / Homemaking Assistance Needed: occasional assist with socks and shoes due to back pain and sciatica            OT Problem List: Decreased strength;Decreased activity tolerance;Impaired balance (sitting and/or standing);Decreased safety awareness;Pain      OT Treatment/Interventions: Self-care/ADL training;Therapeutic exercise;DME and/or AE instruction;Energy conservation;Therapeutic activities;Patient/family education;Balance training    OT Goals(Current goals can be found in the care plan section) Acute Rehab OT Goals Patient Stated Goal: to feel better and get stronger to return home. OT Goal Formulation: With patient Time For Goal Achievement: 09/15/20  OT Frequency: Min 2X/week   Barriers to D/C:            Co-evaluation              AM-PAC OT "6 Clicks" Daily Activity     Outcome Measure Help from another person eating meals?: A Little Help from another person taking care of personal grooming?: A Little Help from another person toileting, which includes using toliet, bedpan, or urinal?: A Little Help from another person  bathing (including washing, rinsing, drying)?: A Lot Help from another person to put on and taking off regular upper body clothing?: A Little Help from another person to put on and taking off regular lower body clothing?: A Lot 6 Click Score: 16   End of Session Nurse Communication: Mobility status  Activity Tolerance: Patient limited by pain Patient left: in bed;with bed alarm set;with call bell/phone within reach  OT Visit Diagnosis: Unsteadiness on feet (R26.81);Muscle weakness (generalized) (M62.81);Pain                Time: 1400-1413 OT Time Calculation (min): 13 min Charges:  OT General Charges $OT Visit: 1 Visit OT Evaluation $OT Eval Low Complexity: 1 Low   Marquette Old, MSOT, OTR/L  Supplemental Rehabilitation Services  [phone]   Zigmund Daniel 09/01/2020, 3:33 PM

## 2020-09-02 ENCOUNTER — Inpatient Hospital Stay (HOSPITAL_COMMUNITY): Payer: Medicare PPO

## 2020-09-02 DIAGNOSIS — R0600 Dyspnea, unspecified: Secondary | ICD-10-CM

## 2020-09-02 DIAGNOSIS — U071 COVID-19: Secondary | ICD-10-CM

## 2020-09-02 DIAGNOSIS — N179 Acute kidney failure, unspecified: Secondary | ICD-10-CM | POA: Diagnosis not present

## 2020-09-02 DIAGNOSIS — K5792 Diverticulitis of intestine, part unspecified, without perforation or abscess without bleeding: Secondary | ICD-10-CM | POA: Diagnosis not present

## 2020-09-02 DIAGNOSIS — I10 Essential (primary) hypertension: Secondary | ICD-10-CM | POA: Diagnosis not present

## 2020-09-02 LAB — GLUCOSE, CAPILLARY
Glucose-Capillary: 134 mg/dL — ABNORMAL HIGH (ref 70–99)
Glucose-Capillary: 160 mg/dL — ABNORMAL HIGH (ref 70–99)
Glucose-Capillary: 193 mg/dL — ABNORMAL HIGH (ref 70–99)
Glucose-Capillary: 218 mg/dL — ABNORMAL HIGH (ref 70–99)

## 2020-09-02 LAB — BASIC METABOLIC PANEL
Anion gap: 6 (ref 5–15)
BUN: 9 mg/dL (ref 8–23)
CO2: 22 mmol/L (ref 22–32)
Calcium: 7.2 mg/dL — ABNORMAL LOW (ref 8.9–10.3)
Chloride: 103 mmol/L (ref 98–111)
Creatinine, Ser: 0.51 mg/dL (ref 0.44–1.00)
GFR, Estimated: >60 mL/min (ref >60)
Glucose, Bld: 141 mg/dL — ABNORMAL HIGH (ref 70–99)
Potassium: 3.7 mmol/L (ref 3.5–5.1)
Sodium: 131 mmol/L — ABNORMAL LOW (ref 135–145)

## 2020-09-02 LAB — MAGNESIUM: Magnesium: 1.4 mg/dL — ABNORMAL LOW (ref 1.7–2.4)

## 2020-09-02 LAB — CBC
HCT: 26.9 % — ABNORMAL LOW (ref 36.0–46.0)
Hemoglobin: 9.3 g/dL — ABNORMAL LOW (ref 12.0–15.0)
MCH: 32.4 pg (ref 26.0–34.0)
MCHC: 34.6 g/dL (ref 30.0–36.0)
MCV: 93.7 fL (ref 80.0–100.0)
Platelets: 187 10*3/uL (ref 150–400)
RBC: 2.87 MIL/uL — ABNORMAL LOW (ref 3.87–5.11)
RDW: 13.9 % (ref 11.5–15.5)
WBC: 18.7 10*3/uL — ABNORMAL HIGH (ref 4.0–10.5)
nRBC: 0 % (ref 0.0–0.2)

## 2020-09-02 LAB — C-REACTIVE PROTEIN: CRP: 17.1 mg/dL — ABNORMAL HIGH (ref <1.0)

## 2020-09-02 LAB — FERRITIN: Ferritin: 791 ng/mL — ABNORMAL HIGH (ref 11–307)

## 2020-09-02 LAB — PHOSPHORUS: Phosphorus: 2.3 mg/dL — ABNORMAL LOW (ref 2.5–4.6)

## 2020-09-02 LAB — D-DIMER, QUANTITATIVE: D-Dimer, Quant: 13.77 ug/mL-FEU — ABNORMAL HIGH (ref 0.00–0.50)

## 2020-09-02 MED ORDER — SODIUM CHLORIDE 0.9 % IV SOLN
100.0000 mg | Freq: Every day | INTRAVENOUS | Status: AC
  Administered 2020-09-03 – 2020-09-04 (×2): 100 mg via INTRAVENOUS
  Filled 2020-09-02: qty 20
  Filled 2020-09-02: qty 100

## 2020-09-02 MED ORDER — MAGNESIUM SULFATE 4 GM/100ML IV SOLN
4.0000 g | Freq: Once | INTRAVENOUS | Status: AC
  Administered 2020-09-02: 4 g via INTRAVENOUS
  Filled 2020-09-02: qty 100

## 2020-09-02 MED ORDER — FUROSEMIDE 10 MG/ML IJ SOLN
40.0000 mg | Freq: Once | INTRAMUSCULAR | Status: AC
  Administered 2020-09-02: 40 mg via INTRAVENOUS
  Filled 2020-09-02: qty 4

## 2020-09-02 MED ORDER — THIAMINE HCL 100 MG/ML IJ SOLN
100.0000 mg | Freq: Every day | INTRAMUSCULAR | Status: DC
  Administered 2020-09-02 – 2020-09-07 (×6): 100 mg via INTRAVENOUS
  Filled 2020-09-02 (×6): qty 2

## 2020-09-02 MED ORDER — POTASSIUM PHOSPHATES 15 MMOLE/5ML IV SOLN
30.0000 mmol | Freq: Once | INTRAVENOUS | Status: AC
  Administered 2020-09-02: 30 mmol via INTRAVENOUS
  Filled 2020-09-02: qty 10

## 2020-09-02 MED ORDER — SODIUM CHLORIDE 0.9 % IV SOLN
200.0000 mg | Freq: Once | INTRAVENOUS | Status: AC
  Administered 2020-09-02: 200 mg via INTRAVENOUS
  Filled 2020-09-02 (×2): qty 40

## 2020-09-02 MED ORDER — METHYLPREDNISOLONE SODIUM SUCC 40 MG IJ SOLR
40.0000 mg | Freq: Two times a day (BID) | INTRAMUSCULAR | Status: DC
  Administered 2020-09-02 – 2020-09-05 (×6): 40 mg via INTRAVENOUS
  Filled 2020-09-02 (×6): qty 1

## 2020-09-02 MED ORDER — TRAVASOL 10 % IV SOLN
INTRAVENOUS | Status: AC
  Filled 2020-09-02: qty 660

## 2020-09-02 MED ORDER — IOHEXOL 350 MG/ML SOLN
52.0000 mL | Freq: Once | INTRAVENOUS | Status: AC | PRN
  Administered 2020-09-02: 52 mL via INTRAVENOUS

## 2020-09-02 NOTE — Progress Notes (Signed)
Physical Therapy Treatment Patient Details Name: Anne Aguirre MRN: [id number] DOB: Jun 29, 1950 Today's Date: 09/02/2020    History of Present Illness 70 y.o. female presented 08/23/20 to the emergency department for evaluation of abdominal pain, nausea, and vomiting. 8/12 CT abd +abscess in pelvis; 8/14 Attempt at CT guided aspiration/drainage of pelvic abscess; 8/16 exp lap with colostomy due to perforated diverticulitis PMH-HTN    PT Comments    Pt supine in bed on arrival.  Pt reports she is feeling tired as she has participated in OT today as well.  Performed LE strengthening exercises and performed gt and transfers with use of RW.  Pt continues to fatigue quickly.  Plan for home health PT with support from her spouse.      Follow Up Recommendations  Home health PT;Supervision for mobility/OOB     Equipment Recommendations  Rolling walker with 5" wheels    Recommendations for Other Services       Precautions / Restrictions Precautions Precautions: Fall Restrictions Weight Bearing Restrictions: No    Mobility  Bed Mobility Overal bed mobility: Needs Assistance Bed Mobility: Rolling;Sidelying to Sit Rolling: Min assist Sidelying to sit: Min assist       General bed mobility comments: Cues for hand placement on railing to move hips to edge of bed, Pt required assistance to elevate trunk into a seated position.    Transfers Overall transfer level: Needs assistance Equipment used: Rolling walker (2 wheeled) Transfers: Sit to/from Stand Sit to Stand: Mod assist         General transfer comment: Cues for hand placement to and from seated surface.  Pt performed x 2 reps this session.  Ambulation/Gait Ambulation/Gait assistance: Min assist Gait Distance (Feet): 8 Feet Assistive device: Rolling walker (2 wheeled) Gait Pattern/deviations: Step-to pattern;Decreased step length - left;Decreased step length - right;Shuffle     General Gait Details: Performed 4 ft forward  and 4 ft backward.  109 bpm-125 bpm.  RR elevated to mid 30s.  SPO2 stable on supplemental O2.   Stairs             Wheelchair Mobility    Modified Rankin (Stroke Patients Only)       Balance Overall balance assessment: Needs assistance Sitting-balance support: No upper extremity supported;Feet supported Sitting balance-Leahy Scale: Fair       Standing balance-Leahy Scale: Poor                              Cognition Arousal/Alertness: Awake/alert Behavior During Therapy: WFL for tasks assessed/performed Overall Cognitive Status: Within Functional Limits for tasks assessed                                        Exercises General Exercises - Lower Extremity Ankle Circles/Pumps: AROM;Both;10 reps;Supine Quad Sets: AROM;Both;10 reps;Supine Heel Slides: AROM;Both;10 reps;Supine;AAROM Hip ABduction/ADduction: AROM;Both;Supine;AAROM;10 reps Straight Leg Raises: AROM;Both;10 reps;AAROM;Supine    General Comments        Pertinent Vitals/Pain Pain Assessment: 0-10 Pain Score: 7  Pain Location: abd Pain Descriptors / Indicators: Constant;Discomfort;Operative site guarding Pain Intervention(s): Monitored during session;Repositioned    Home Living                      Prior Function            PT Goals (current goals can now be  found in the care plan section) Acute Rehab PT Goals Patient Stated Goal: to feel better and get stronger to return home. Potential to Achieve Goals: Good Progress towards PT goals: Progressing toward goals    Frequency    Min 3X/week      PT Plan Current plan remains appropriate    Co-evaluation              AM-PAC PT "6 Clicks" Mobility   Outcome Measure  Help needed turning from your back to your side while in a flat bed without using bedrails?: A Little Help needed moving from lying on your back to sitting on the side of a flat bed without using bedrails?: A Lot Help needed  moving to and from a bed to a chair (including a wheelchair)?: A Lot Help needed standing up from a chair using your arms (e.g., wheelchair or bedside chair)?: A Lot Help needed to walk in hospital room?: A Lot Help needed climbing 3-5 steps with a railing? : Total 6 Click Score: 12    End of Session Equipment Utilized During Treatment: Gait belt;Oxygen Activity Tolerance: Patient limited by fatigue;Treatment limited secondary to medical complications (Comment) (increased RR and HR)   Nurse Communication: Mobility status PT Visit Diagnosis: Unsteadiness on feet (R26.81);Pain Pain - Right/Left:  (left) Pain - part of body:  (abdomen)     Time: 0254-2706 PT Time Calculation (min) (ACUTE ONLY): 30 min  Charges:  $Gait Training: 8-22 mins $Therapeutic Exercise: 8-22 mins                     Bonney Leitz , PTA Acute Rehabilitation Services Pager [phone] Office [phone]    Arnold Kester Artis Delay 09/02/2020, 2:17 PM

## 2020-09-02 NOTE — Plan of Care (Signed)
  Problem: Education: Goal: Knowledge of General Education information will improve Description: Including pain rating scale, medication(s)/side effects and non-pharmacologic comfort measures Outcome: Progressing   Problem: Activity: Goal: Risk for activity intolerance will decrease Outcome: Progressing   Problem: Coping: Goal: Level of anxiety will decrease Outcome: Progressing   

## 2020-09-02 NOTE — Plan of Care (Signed)

## 2020-09-02 NOTE — Progress Notes (Signed)
Progress Note  4 Days Post-Op  Subjective: Patient denies nausea. Has not been out of bed much. Pain is stable. She reports she is thirsty this AM.   Objective: Vital signs in last 24 hours: Temp:  [97.8 F (36.6 C)-99.6 F (37.6 C)] 98.6 F (37 C) (08/20 0820) Pulse Rate:  [89-114] 114 (08/20 0447) Resp:  [19-26] 23 (08/20 0447) BP: (113-158)/(67-95) 157/95 (08/20 0820) SpO2:  [94 %-98 %] 94 % (08/20 0820) Last BM Date: 08/28/20  Intake/Output from previous day: 08/19 0701 - 08/20 0700 In: 1747.3 [I.V.:1192.6; IV Piggyback:554.7] Out: 825 [Urine:800; Drains:25] Intake/Output this shift: No intake/output data recorded.  PE: General: anxious Heart: tachy in 100s Lungs: sound a bit wet, but overall clear. Sats around 93% on 1.5L Abd: soft, appropriately tender, midline wound is clean and packed.  Colostomy with pink stoma and small amount dark brown soft stool. JP is SS   Lab Results:  Recent Labs    09/01/20 0421 09/02/20 0452  WBC 24.6* 18.7*  HGB 10.2* 9.3*  HCT 28.8* 26.9*  PLT 205 187   BMET Recent Labs    09/01/20 0421 09/02/20 0452  NA 133* 131*  K 4.1 3.7  CL 106 103  CO2 20* 22  GLUCOSE 150* 141*  BUN 11 9  CREATININE 0.58 0.51  CALCIUM 7.3* 7.2*   PT/INR No results for input(s): LABPROT, INR in the last 72 hours. CMP     Component Value Date/Time   NA 131 (L) 09/02/2020 0452   K 3.7 09/02/2020 0452   CL 103 09/02/2020 0452   CO2 22 09/02/2020 0452   GLUCOSE 141 (H) 09/02/2020 0452   BUN 9 09/02/2020 0452   CREATININE 0.51 09/02/2020 0452   CREATININE 1.01 (H) 04/04/2020 0000   CALCIUM 7.2 (L) 09/02/2020 0452   PROT 4.4 (L) 08/31/2020 0645   ALBUMIN 2.0 (L) 08/31/2020 0645   AST 24 08/31/2020 0645   ALT 16 08/31/2020 0645   ALKPHOS 34 (L) 08/31/2020 0645   BILITOT 0.9 08/31/2020 0645   GFRNONAA >60 09/02/2020 0452   GFRNONAA 57 (L) 04/04/2020 0000   GFRAA 66 04/04/2020 0000   Lipase     Component Value Date/Time   LIPASE 20  08/23/2020 1341       Studies/Results: DG CHEST PORT 1 VIEW  Result Date: 08/31/2020 CLINICAL DATA:  Shortness of breath EXAM: PORTABLE CHEST 1 VIEW COMPARISON:  Chest x-ray dated August 23, 2020 FINDINGS: Right arm PICC with tip positioned over the expected area of the upper right atrium. Low lung volumes with bibasilar opacities, likely combination of atelectasis and pleural effusions. No evidence of pneumothorax. Visualized cardiac and mediastinal contours are within normal limits. Lucency overlying the left upper quadrant, likely postoperative pneumoperitoneum. IMPRESSION: Right arm PICC with tip positioned over the expected area of the upper right atrium. Low lung volumes with bibasilar opacities, likely combination of atelectasis and pleural effusions. Electronically Signed   By: Allegra Lai M.D.   On: 08/31/2020 13:45    Anti-infectives: Anti-infectives (From admission, onward)    Start     Dose/Rate Route Frequency Ordered Stop   08/27/20 1530  ertapenem (INVANZ) 1,000 mg in sodium chloride 0.9 % 100 mL IVPB        1 g 200 mL/hr over 30 Minutes Intravenous Every 24 hours 08/27/20 1406     08/24/20 1145  ceFEPIme (MAXIPIME) 2 g in sodium chloride 0.9 % 100 mL IVPB  Status:  Discontinued  2 g 200 mL/hr over 30 Minutes Intravenous Every 12 hours 08/24/20 1051 08/27/20 1406   08/23/20 2100  ceFEPIme (MAXIPIME) 2 g in sodium chloride 0.9 % 100 mL IVPB  Status:  Discontinued        2 g 200 mL/hr over 30 Minutes Intravenous Every 24 hours 08/23/20 2051 08/24/20 1051   08/23/20 1700  ciprofloxacin (CIPRO) IVPB 400 mg  Status:  Discontinued        400 mg 200 mL/hr over 60 Minutes Intravenous Every 12 hours 08/23/20 1645 08/23/20 2041   08/23/20 1700  metroNIDAZOLE (FLAGYL) IVPB 500 mg  Status:  Discontinued        500 mg 100 mL/hr over 60 Minutes Intravenous Every 8 hours 08/23/20 1645 08/27/20 1406        Assessment/Plan POD#4 s/p ex lap and Hartman's procedure with  colostomy by AR 8/16 for diverticulitis with abscess - JP drain with 25 ml serosanguinous fluid - WBC down to 17K today.  On Invanz - starting to have some stool output - start sips and ice chips today, would not advance past this - TNA started 8/17, continue for now, will start weaning once pt tolerating FLD - IS/pulm toilet - mobilize   FEN: ice chips and sips, TNA ID: ertapenem 8/14>> VTE: scds, lovenox Foley: out 8/17, voiding   HTN AKI COVID+ - per primary team   LOS: 10 days    Juliet Rude, [facility] 09/02/2020, 10:41 AM Please see Amion for pager number during day hours 7:00am-4:30pm

## 2020-09-02 NOTE — TOC Progression Note (Signed)
Transition of Care [facility]) - Progression Note    Patient Details  Name: Anne Aguirre MRN: [id number] Date of Birth: 27-Jun-1950  [facility] CM/SW Contact  Lorri Frederick, LCSW Phone Number: 09/02/2020, 11:04 AM  Clinical Narrative:   Message from Christy/[facility].  She has to decline [facility] referral due to staffing.     Expected Discharge Plan: Home w Home Health Services Barriers to Discharge: Continued Medical Work up  Expected Discharge Plan and Services Expected Discharge Plan: Home w Home Health Services     Post Acute Care Choice: Home Health Living arrangements for the past 2 months: Single Family Home                           HH Arranged: PT [facility] Agency: [facility] Date [facility] Agency Contacted: 08/31/20 Time HH Agency Contacted: 1402 Representative spoke with at [facility] Agency: Neysa Bonito   Social Determinants of Health (SDOH) Interventions    Readmission Risk Interventions No flowsheet data found.

## 2020-09-02 NOTE — Progress Notes (Addendum)
PROGRESS NOTE   Anne Aguirre  YHC:[id number] DOB: 1951/01/12 DOA: 08/23/2020 PCP: Pcp, No    Brief Narrative:  70 y.o. female with past medical history of hypertension presented to the hospital with abdominal pain, nausea, vomiting and cramps.  Patient was constipated prior to presentation.  Patient was unable to tolerate any food.  In the ED, patient was noted to have a diverticulitis with developing abscess.  Patient was then considered for admission to [facility] for further IR intervention and surgical follow-up.    During hospitalization, patient was seen by interventional radiology but there was no drainable abscess so he was treated initially conservatively with Invanz and general surgery followed-up.  Patient however persisted to have increasing abdominal pain persistent leukocytosis and elevated lactate, so patient underwent a Hartmann procedure on  on 08/29/2020.  Postoperatively, patient has been been having some difficulty breathing and persist to have ileus.  She has been started on TPN.  She has tested incidentally positive for COVID since her husband had recently been positive.  Assessment & Plan:   Principal Problem:   Acute diverticulitis Active Problems:   Essential hypertension, benign   Acute renal failure superimposed on stage 3a chronic kidney disease (HCC)   Hyponatremia   Hyperbilirubinemia   Hypercalcemia  Acute sigmoid diverticulitis,  with perforation and peritonitis with severe sepsis present on admission Status post colonic resection and colostomy (Hartmann procedure) due to worsening abdominal symptoms.  Postop day 4 today.  General surgery on board.  On  Invanz.  Leukocytosis has slightly trended down.  Continue TPN for prolonged ileus.  General surgery following.  Patient is still n.p.o. with ice chips and sips with meds.  Anxiety.  Likely exacerbating breathing issues.  On Ativan IV as needed.   Dyspnea.  Incidental COVID-positive.  Chest x-ray showed   basilar atelectasis/effusion.  Patient has been receiving intermittent doses of Lasix.  Patient will need to continue incentive spirometry, deep breathing.  Continue albuterol inhaler.  Patient is overall positive balance for 6970 mL.  We will give 1 dose of IV Lasix 40 mg again today.  Patient has tested positive for COVID as well.  We will get inflammatory markers today.  We will get a CTA scan of the chest PE protocol to rule out pulmonary embolism with persistent hypoxia tachycardia and dyspnea despite supportive care and diuretics..  We will start Solu-Medrol/remdesivir due to  persistent dyspnea with hypoxia  Postoperative ileus, on TPN at this time.  Continue to monitor closely.  Postoperative pain. on morphine , muscle relaxant.  Controlled   Acute kidney injury superimposed on CKD IIIa  Improved.  Latest creatinine of 0.5  Hypokalemia.  Resolved.  Hypophosphatemia.   Latest phosphate of 2.3.  We will continue to replenish aggressively.  Hypomagnesemia.  We will give 4 g of magnesium sulfate today.  Hyponatremia  Mild.  Continue to monitor closely.  Hypocalcemia.   Asymptomatic.  Will replace as necessary.   Hyperbilirubinemia  Improved, Liver cyst on CT scan of the abdomen.  Essential hypertension  Patient is on amlodipine, Avapro and nadolol at home.  Blood pressure currently stable.  As needed antihypertensives if needed   DVT prophylaxis: SCD's  Code Status:  Full code  Family Communication:  None today.  Status is: Inpatient  Remains inpatient appropriate because:Inpatient level of care appropriate due to severity of illness, on IV antibiotics, postoperative ileus on TPN.    Dispo: The patient is from: Home  Anticipated d/c is to: Home with home health as per PT evaluation.  We will continue to evaluate during hospitalization              Patient currently is not medically stable to d/c.   Difficult to place patient No   Consultants:  General  Surgery IR  Procedures:  Status post Hartmann procedure with colostomy on 08/29/2020  Antimicrobials: Ertapenem  Subjective: Today, patient was seen and examined at the bedside.  Still complains of abdominal pain and some dyspnea and feels anxious.  Objective:  Vitals:   09/02/20 0152 09/02/20 0447 09/02/20 0820 09/02/20 1313  BP:  (!) 143/88 (!) 157/95 (!) 147/85  Pulse: 94 (!) 114    Resp: (!) 21 (!) 23    Temp:  98 F (36.7 C) 98.6 F (37 C) 98.9 F (37.2 C)  TempSrc:  Oral Oral Oral  SpO2:  96% 94%   Weight:      Height:        Intake/Output Summary (Last 24 hours) at 09/02/2020 1335 Last data filed at 09/02/2020 0531 Gross per 24 hour  Intake 1747.27 ml  Output 825 ml  Net 922.27 ml    Filed Weights   08/29/20 0421 08/30/20 0920 09/01/20 0500  Weight: 72.1 kg 71.9 kg 73.5 kg   Body mass index is 26.15 kg/m.   Physical examination: General: Average built, mildly tachypneic, on nasal cannula oxygen, anxious HENT:   No scleral pallor or icterus noted. Oral mucosa is moist.  Chest: Decreased breath sounds.  Tachypneic.  Coarse breath sounds  CVS: S1 &S2 heard. No murmur.  Regular rate and rhythm. Abdomen: Soft, abdominal wound with dressing and colostomy.  Bowel sounds absent.. Extremities: No cyanosis, clubbing or edema.  Peripheral pulses are palpable. Psych: Alert, awake and oriented, mildly anxious CNS:  No cranial nerve deficits.  Power equal in all extremities.   Skin: Warm and dry.  Abdominal wound with dressing.  Data Reviewed: I have personally reviewed following labs and imaging studies  CBC: Recent Labs  Lab 08/29/20 0406 08/30/20 0742 08/31/20 0645 09/01/20 0421 09/02/20 0452  WBC 22.3* 17.5* 29.6* 24.6* 18.7*  HGB 15.5* 11.3* 11.2* 10.2* 9.3*  HCT 43.7 31.2* 31.2* 28.8* 26.9*  MCV 89.9 90.7 90.7 92.0 93.7  PLT 454* 264 306 205 187    Basic Metabolic Panel: Recent Labs  Lab 08/29/20 0406 08/30/20 0742 08/31/20 0645 09/01/20 0421  09/02/20 0452  NA 129* 130* 131* 133* 131*  K 3.2* 4.0 3.7 4.1 3.7  CL 104 104 106 106 103  CO2 12* 18* 18* 20* 22  GLUCOSE 89 106* 119* 150* 141*  BUN 10 13 13 11 9   CREATININE 1.15* 0.91 0.64 0.58 0.51  CALCIUM 6.9* 7.0* 7.4* 7.3* 7.2*  MG 1.4* 1.5* 1.5* 1.3* 1.4*  PHOS 2.3* 3.4 2.0* 2.3* 2.3*    GFR: Estimated Creatinine Clearance: 69.1 mL/min (by C-G formula based on SCr of 0.51 mg/dL). Liver Function Tests: Recent Labs  Lab 08/29/20 0406 08/30/20 0742 08/31/20 0645  AST 28 26 24   ALT 18 14 16   ALKPHOS 49 23* 34*  BILITOT 1.9* 1.2 0.9  PROT 5.1* 3.8* 4.4*  ALBUMIN 2.0* 2.0* 2.0*    No results for input(s): LIPASE, AMYLASE in the last 168 hours.  No results for input(s): AMMONIA in the last 168 hours. Coagulation Profile: Recent Labs  Lab 08/27/20 0456  INR 1.3*    Cardiac Enzymes: No results for input(s): CKTOTAL, CKMB, CKMBINDEX, TROPONINI in  the last 168 hours. BNP (last 3 results) No results for input(s): PROBNP in the last 8760 hours. HbA1C: No results for input(s): HGBA1C in the last 72 hours. CBG: Recent Labs  Lab 09/01/20 1159 09/01/20 1603 09/01/20 2143 09/02/20 0445 09/02/20 1310  GLUCAP 155* 116* 147* 134* 160*    Lipid Profile: Recent Labs    08/31/20 0645  TRIG 77    Thyroid Function Tests: No results for input(s): TSH, T4TOTAL, FREET4, T3FREE, THYROIDAB in the last 72 hours. Anemia Panel: No results for input(s): VITAMINB12, FOLATE, FERRITIN, TIBC, IRON, RETICCTPCT in the last 72 hours. Sepsis Labs: Recent Labs  Lab 08/28/20 2143 08/29/20 0925  LATICACIDVEN 2.5* 2.6*     Recent Results (from the past 240 hour(s))  Resp Panel by RT-PCR (Flu A&B, Covid) Nasopharyngeal Swab     Status: None   Collection Time: 08/23/20  3:35 PM   Specimen: Nasopharyngeal Swab; Nasopharyngeal(NP) swabs in vial transport medium  Result Value Ref Range Status   SARS Coronavirus 2 by RT PCR NEGATIVE NEGATIVE Final    Comment:  (NOTE) SARS-CoV-2 target nucleic acids are NOT DETECTED.  The SARS-CoV-2 RNA is generally detectable in upper respiratory specimens during the acute phase of infection. The lowest concentration of SARS-CoV-2 viral copies this assay can detect is 138 copies/mL. A negative result does not preclude SARS-Cov-2 infection and should not be used as the sole basis for treatment or other patient management decisions. A negative result may occur with  improper specimen collection/handling, submission of specimen other than nasopharyngeal swab, presence of viral mutation(s) within the areas targeted by this assay, and inadequate number of viral copies(<138 copies/mL). A negative result must be combined with clinical observations, patient history, and epidemiological information. The expected result is Negative.  Fact Sheet for Patients:  BloggerCourse.comhttps://www.fda.gov/media/152166/download  Fact Sheet for Healthcare Providers:  SeriousBroker.ithttps://www.fda.gov/media/152162/download  This test is no t yet approved or cleared by the [place] FDA and  has been authorized for detection and/or diagnosis of SARS-CoV-2 by FDA under an Emergency Use Authorization (EUA). This EUA will remain  in effect (meaning this test can be used) for the duration of the COVID-19 declaration under Section 564(b)(1) of the Act, 21 U.S.C.section 360bbb-3(b)(1), unless the authorization is terminated  or revoked sooner.       Influenza A by PCR NEGATIVE NEGATIVE Final   Influenza B by PCR NEGATIVE NEGATIVE Final    Comment: (NOTE) The Xpert Xpress SARS-CoV-2/FLU/RSV plus assay is intended as an aid in the diagnosis of influenza from Nasopharyngeal swab specimens and should not be used as a sole basis for treatment. Nasal washings and aspirates are unacceptable for Xpert Xpress SARS-CoV-2/FLU/RSV testing.  Fact Sheet for Patients: BloggerCourse.comhttps://www.fda.gov/media/152166/download  Fact Sheet for Healthcare  Providers: SeriousBroker.ithttps://www.fda.gov/media/152162/download  This test is not yet approved or cleared by the [place] FDA and has been authorized for detection and/or diagnosis of SARS-CoV-2 by FDA under an Emergency Use Authorization (EUA). This EUA will remain in effect (meaning this test can be used) for the duration of the COVID-19 declaration under Section 564(b)(1) of the Act, 21 U.S.C. section 360bbb-3(b)(1), unless the authorization is terminated or revoked.  Performed at [facility], [address] [place], [place] [address]   Culture, blood (routine x 2)     Status: None   Collection Time: 08/23/20  8:07 PM   Specimen: BLOOD RIGHT ARM  Result Value Ref Range Status   Specimen Description BLOOD RIGHT ARM  Final   Special Requests  Final    Blood Culture results may not be optimal due to an inadequate volume of blood received in culture bottles BOTTLES DRAWN AEROBIC AND ANAEROBIC   Culture   Final    NO GROWTH 5 DAYS Performed at [facility], [address] [place], [place] [address]    Report Status 08/28/2020 FINAL  Final  MRSA Next Gen by PCR, Nasal     Status: None   Collection Time: 08/24/20  3:43 AM   Specimen: Nasal Mucosa; Nasal Swab  Result Value Ref Range Status   MRSA by PCR Next Gen NOT DETECTED NOT DETECTED Final    Comment: (NOTE) The GeneXpert MRSA Assay (FDA approved for NASAL specimens only), is one component of a comprehensive MRSA colonization surveillance program. It is not intended to diagnose MRSA infection nor to guide or monitor treatment for MRSA infections. Test performance is not FDA approved in patients less than 30 years old. Performed at [facility], [address] [place], [place] [address]   SARS CORONAVIRUS 2 (TAT 6-24 HRS) Nasopharyngeal Nasopharyngeal Swab     Status: Abnormal   Collection Time: 09/01/20  7:53 AM   Specimen: Nasopharyngeal Swab  Result Value Ref Range Status   SARS Coronavirus 2 POSITIVE (A) NEGATIVE Final     Comment: (NOTE) SARS-CoV-2 target nucleic acids are DETECTED.  The SARS-CoV-2 RNA is generally detectable in upper and lower respiratory specimens during the acute phase of infection. Positive results are indicative of the presence of SARS-CoV-2 RNA. Clinical correlation with patient history and other diagnostic information is  necessary to determine patient infection status. Positive results do not rule out bacterial infection or co-infection with other viruses.  The expected result is Negative.  Fact Sheet for Patients: HairSlick.no  Fact Sheet for Healthcare Providers: quierodirigir.com  This test is not yet approved or cleared by the [place] FDA and  has been authorized for detection and/or diagnosis of SARS-CoV-2 by FDA under an Emergency Use Authorization (EUA). This EUA will remain  in effect (meaning this test can be used) for the duration of the COVID-19 declaration under Section 564(b)(1) of the Act, 21 U. S.C. section 360bbb-3(b)(1), unless the authorization is terminated or revoked sooner.   Performed at [facility], [address] [place], [place] [address]       Radiology Studies: No results found.  Scheduled Meds:  Chlorhexidine Gluconate Cloth  6 each Topical Daily   enoxaparin (LOVENOX) injection  40 mg Subcutaneous Q24H   insulin aspart  0-6 Units Subcutaneous Q8H   pantoprazole (PROTONIX) IV  40 mg Intravenous Q24H   sodium chloride flush  10-40 mL Intracatheter Q12H   sodium chloride flush  10-40 mL Intracatheter Q12H   Continuous Infusions:  ertapenem 1,000 mg (09/01/20 1029)   methocarbamol (ROBAXIN) IV 1,000 mg (09/02/20 0018)   potassium PHOSPHATE IVPB (in mmol) 30 mmol (09/02/20 1026)   promethazine (PHENERGAN) injection (IM or IVPB)     TPN ADULT (ION) 55 mL/hr at 09/02/20 0531   TPN ADULT (ION)       LOS: 10 days   Joycelyn Das, MD [facility] If 7PM-7AM,  please contact night-coverage 09/02/2020, 1:35 PM

## 2020-09-02 NOTE — Progress Notes (Signed)
PHARMACY - TOTAL PARENTERAL NUTRITION CONSULT NOTE   Indication: Prolonged ileus  Patient Measurements: Height: 5\' 6"  (167.6 cm) Weight: 73.5 kg (162 lb 0.6 oz) IBW/kg (Calculated) : 59.3 TPN AdjBW (KG): 56.2 Body mass index is 26.15 kg/m. Usual Weight: ~60kg  Assessment: 54 YOF presenting with sigmoid diverticulitis, with perforation and abscess.  Now s/p Hartman's with colostomy, post-op ileus and consulted for initiation of TPN.    Glucose / Insulin: A1c 5 (03/2020), CBGs 130-150s 1 unit of insulin given - vSSI Electrolytes: K 3.7, Na 131, Mg 1.4, Phos 2.3; CoCa 9 Renal: SCr 0.51, BUN 9 Hepatic: AST/ALT wnl, Tbili 0.9, TG 77 Intake / Output; MIVF: UOP 0.2>0.5 ml/kg/hr, Drain 40>25 mL GI Imaging: 8/10 CT abd: Sigmoid diverticulitis acute 8/12 CT abd: Developing abscess, possible rupture  GI Surgeries / Procedures:  8/14: IR drain attempt 8/16 Hartman's procedure w/ colostomy  Central access: for PICC placement today TPN start date: 8/17  Nutritional Goals: RD Assessment:  Estimated Needs Total Energy Estimated Needs: 1600-1800 Total Protein Estimated Needs: 80-90 grams Total Fluid Estimated Needs: >1.6L/d  Goal rate 70 ml/hr to provide 84 gm protein, 252 gm CHO, 50 gm lipid which provides 1642 Kcal  Current Nutrition:  NPO  Plan:  Continue TPN at 55 mL/hr at 1800 (provides 66g AA, 1293Kcal) until lytes stabilize more from refeeding  Electrolytes in TPN: Inc Na 3mEq/L, K 39mEq/L, Ca 48mEq/L, Incr Mg 40mEq/L, and Incr Phos 25 mmol/L. Cl:Ac 1:2 Add standard MVI and trace elements to TPN Continue Sensitive q8h SSI and adjust as needed  Give Mg 4g IV x 1 Give Kphos 30 mmol IV x 1 BMP and Mg/Phos in AM  9m, PharmD Clinical Pharmacist Please check AMION for all [facility] Pharmacy numbers 09/02/2020 7:15 AM

## 2020-09-03 DIAGNOSIS — K5792 Diverticulitis of intestine, part unspecified, without perforation or abscess without bleeding: Secondary | ICD-10-CM | POA: Diagnosis not present

## 2020-09-03 LAB — BASIC METABOLIC PANEL
Anion gap: 7 (ref 5–15)
BUN: 10 mg/dL (ref 8–23)
CO2: 25 mmol/L (ref 22–32)
Calcium: 7.5 mg/dL — ABNORMAL LOW (ref 8.9–10.3)
Chloride: 102 mmol/L (ref 98–111)
Creatinine, Ser: 0.47 mg/dL (ref 0.44–1.00)
GFR, Estimated: >60 mL/min (ref >60)
Glucose, Bld: 220 mg/dL — ABNORMAL HIGH (ref 70–99)
Potassium: 4.2 mmol/L (ref 3.5–5.1)
Sodium: 134 mmol/L — ABNORMAL LOW (ref 135–145)

## 2020-09-03 LAB — CBC
HCT: 23.3 % — ABNORMAL LOW (ref 36.0–46.0)
Hemoglobin: 8 g/dL — ABNORMAL LOW (ref 12.0–15.0)
MCH: 32.7 pg (ref 26.0–34.0)
MCHC: 34.3 g/dL (ref 30.0–36.0)
MCV: 95.1 fL (ref 80.0–100.0)
Platelets: 184 10*3/uL (ref 150–400)
RBC: 2.45 MIL/uL — ABNORMAL LOW (ref 3.87–5.11)
RDW: 14.1 % (ref 11.5–15.5)
WBC: 18.4 10*3/uL — ABNORMAL HIGH (ref 4.0–10.5)
nRBC: 0 % (ref 0.0–0.2)

## 2020-09-03 LAB — GLUCOSE, CAPILLARY
Glucose-Capillary: 131 mg/dL — ABNORMAL HIGH (ref 70–99)
Glucose-Capillary: 202 mg/dL — ABNORMAL HIGH (ref 70–99)
Glucose-Capillary: 195 mg/dL — ABNORMAL HIGH (ref 70–99)
Glucose-Capillary: 210 mg/dL — ABNORMAL HIGH (ref 70–99)
Glucose-Capillary: 213 mg/dL — ABNORMAL HIGH (ref 70–99)

## 2020-09-03 LAB — MAGNESIUM: Magnesium: 1.7 mg/dL (ref 1.7–2.4)

## 2020-09-03 LAB — PHOSPHORUS: Phosphorus: 2.7 mg/dL (ref 2.5–4.6)

## 2020-09-03 MED ORDER — MORPHINE SULFATE (PF) 4 MG/ML IV SOLN: 4.0000 mg | INTRAVENOUS | Status: DC | PRN

## 2020-09-03 MED ORDER — BOOST / RESOURCE BREEZE PO LIQD CUSTOM
1.0000 | Freq: Three times a day (TID) | ORAL | Status: DC
  Administered 2020-09-03 – 2020-09-05 (×8): 1 via ORAL

## 2020-09-03 MED ORDER — METHOCARBAMOL 500 MG PO TABS
1000.0000 mg | ORAL_TABLET | Freq: Three times a day (TID) | ORAL | Status: DC
  Administered 2020-09-03 – 2020-09-08 (×16): 1000 mg via ORAL
  Filled 2020-09-03 (×16): qty 2

## 2020-09-03 MED ORDER — MAGNESIUM SULFATE 2 GM/50ML IV SOLN
2.0000 g | Freq: Once | INTRAVENOUS | Status: AC
  Administered 2020-09-03: 2 g via INTRAVENOUS
  Filled 2020-09-03: qty 50

## 2020-09-03 MED ORDER — TRAVASOL 10 % IV SOLN
INTRAVENOUS | Status: AC
  Filled 2020-09-03: qty 840

## 2020-09-03 MED ORDER — ACETAMINOPHEN 325 MG PO TABS
650.0000 mg | ORAL_TABLET | Freq: Four times a day (QID) | ORAL | Status: DC
  Administered 2020-09-03 – 2020-09-08 (×20): 650 mg via ORAL
  Filled 2020-09-03 (×20): qty 2

## 2020-09-03 MED ORDER — SIMETHICONE 80 MG PO CHEW
80.0000 mg | CHEWABLE_TABLET | Freq: Four times a day (QID) | ORAL | Status: DC | PRN
  Administered 2020-09-04: 80 mg via ORAL
  Filled 2020-09-03 (×2): qty 1

## 2020-09-03 MED ORDER — OXYCODONE HCL 5 MG PO TABS
5.0000 mg | ORAL_TABLET | ORAL | Status: DC | PRN
  Administered 2020-09-03 – 2020-09-06 (×3): 10 mg via ORAL
  Administered 2020-09-06 – 2020-09-07 (×4): 5 mg via ORAL
  Administered 2020-09-07: 10 mg via ORAL
  Filled 2020-09-03: qty 2
  Filled 2020-09-03: qty 1
  Filled 2020-09-03: qty 2
  Filled 2020-09-03: qty 1
  Filled 2020-09-03 (×2): qty 2
  Filled 2020-09-03 (×2): qty 1

## 2020-09-03 NOTE — Progress Notes (Signed)
PHARMACY - TOTAL PARENTERAL NUTRITION CONSULT NOTE   Indication: Prolonged ileus  Patient Measurements: Height: 5\' 6"  (167.6 cm) Weight: 71.6 kg (157 lb 13.6 oz) IBW/kg (Calculated) : 59.3 TPN AdjBW (KG): 56.2 Body mass index is 25.48 kg/m. Usual Weight: ~60kg  Assessment: 23 YOF presenting with sigmoid diverticulitis, with perforation and abscess.  Now s/p Hartman's with colostomy, post-op ileus and consulted for initiation of TPN.    Glucose / Insulin: A1c 5 (03/2020), CBGs 130-200s 1 unit of insulin given - vSSI, also started Solu-Medrol yesterday Electrolytes: K 3.7>4.2, Na 131>134, Mg 1.7, Phos 2.3>2.7; CoCa 9 Renal: SCr 0.47, BUN 10 Hepatic: AST/ALT wnl, Tbili 0.9, TG 77 Intake / Output; MIVF: UOP 0.2>0.8 ml/kg/hr, some ostomy output GI Imaging: 8/10 CT abd: Sigmoid diverticulitis acute 8/12 CT abd: Developing abscess, possible rupture  GI Surgeries / Procedures:  8/14: IR drain attempt 8/16 Hartman's procedure w/ colostomy  Central access: for PICC placement today TPN start date: 8/17  Nutritional Goals: RD Assessment:  Estimated Needs Total Energy Estimated Needs: 1600-1800 Total Protein Estimated Needs: 80-90 grams Total Fluid Estimated Needs: >1.6L/d  Goal rate 70 ml/hr to provide 84 gm protein, 252 gm CHO, 50 gm lipid which provides 1697 Kcal  Current Nutrition:  NPO, trial sips/chips  Plan:  Increase TPN to goal at 70 mL/hr at 1800  Electrolytes in TPN: Inc Na 24mEq/L, K 14mEq/L, Ca 30mEq/L, Incr Mg 20mEq/L, and Incr Phos 25 mmol/L. Cl:Ac 1:2 Add standard MVI and trace elements to TPN Continue Sensitive q8h SSI and adjust as needed  TPN labs in AM  9m, PharmD Clinical Pharmacist Please check AMION for all [facility] Pharmacy numbers 09/03/2020 7:10 AM

## 2020-09-03 NOTE — Progress Notes (Signed)
PROGRESS NOTE   Anne Aguirre  LAG:[id number] DOB: 1950-06-18 DOA: 08/23/2020 PCP: Pcp, No    Brief Narrative:  70 y.o. female with past medical history of hypertension presented to the hospital with abdominal pain, nausea, vomiting and cramps.  Patient was constipated prior to presentation.  Patient was unable to tolerate any food.  In the ED, patient was noted to have a diverticulitis with developing abscess.  Patient was then considered for admission to [facility] for further IR intervention and surgical follow-up.    During hospitalization, patient was seen by interventional radiology but there was no drainable abscess so he was treated initially conservatively with Invanz and general surgery followed-up.  Patient however persisted to have increasing abdominal pain persistent leukocytosis and elevated lactate, so patient underwent a Hartmann procedure on  on 08/29/2020.  Postoperatively, patient has been been having some difficulty breathing and persist to have ileus.  She has been started on TPN.  She has tested incidentally positive for COVID since her husband had recently been positive.  Assessment & Plan:   Principal Problem:   Acute diverticulitis Active Problems:   Essential hypertension, benign   Acute renal failure superimposed on stage 3a chronic kidney disease (HCC)   Hyponatremia   Hyperbilirubinemia   Hypercalcemia  Acute sigmoid diverticulitis,  with perforation and peritonitis with severe sepsis present on admission Status post colonic resection and colostomy (Hartmann procedure) due to worsening abdominal symptoms.  Postop day 5 today.  General surgery on board.  On  Invanz.  Leukocytosis has slightly trended down.  Continue TPN for prolonged ileus.  Advancing to clear liquid today.  Anxiety.  Likely exacerbating breathing issues.  On Ativan IV as needed.   Dyspnea.  Incidental COVID-positive.  Negative test on admission 8/10 Positive test on 8/19.  Elevated  inflammatory markers including CRP of 17, D-dimer 13.  CTA of the chest with bilateral groundglass opacities consistent with COVID-pneumonia.  Negative for pulmonary embolism.  Continue to monitor due to significant symptoms  chest physiotherapy, incentive spirometry, deep breathing exercises, sputum induction, mucolytic's and bronchodilators. Supplemental oxygen to keep saturations more than 90%. Covid directed therapy with , steroids, Solu-Medrol remdesivir, remdesivir for 3 days Due to severity of symptoms, patient will need daily inflammatory markers, chest x-rays, liver function test to monitor and direct COVID-19 therapies.  Postoperative ileus, on TPN at this time.  Continue to monitor closely.  Advancing diet.  Replace magnesium.  Postoperative pain. on morphine , muscle relaxant.  Controlled   Essential hypertension  Patient is on amlodipine, Avapro and nadolol at home.  Blood pressure currently stable.  As needed antihypertensives if needed   DVT prophylaxis: SCD's  Code Status:  Full code  Family Communication:  None today.  Status is: Inpatient  Remains inpatient appropriate because:Inpatient level of care appropriate due to severity of illness, on IV antibiotics, postoperative ileus on TPN.    Dispo: The patient is from: Home              Anticipated d/c is to: Home with home health as per PT evaluation.  We will continue to evaluate during hospitalization              Patient currently is not medically stable to d/c.   Difficult to place patient No   Consultants:  General Surgery IR  Procedures:  Status post Gertie Gowda procedure with colostomy on 08/29/2020  Antimicrobials: Ertapenem  Subjective: Patient seen and examined.  Still has bloating of the abdomen but  better.  She was very happy to have flatus and bowel movement in the colostomy. Mild shortness of breath at rest and exertion.  Was able to walk to the bathroom without oxygen.  Unable to take any  meaningful deep breaths in.  Afebrile.   Objective:  Vitals:   09/03/20 0400 09/03/20 0500 09/03/20 0840 09/03/20 1154  BP:   140/77 (!) 133/93  Pulse:   76 82  Resp:   17 (!) 21  Temp: 98.1 F (36.7 C)  98 F (36.7 C) 97.8 F (36.6 C)  TempSrc: Oral  Oral Oral  SpO2:   97% 98%  Weight:  71.6 kg    Height:        Intake/Output Summary (Last 24 hours) at 09/03/2020 1313 Last data filed at 09/03/2020 0900 Gross per 24 hour  Intake 1024.95 ml  Output 1550 ml  Net -525.05 ml   Filed Weights   08/30/20 0920 09/01/20 0500 09/03/20 0500  Weight: 71.9 kg 73.5 kg 71.6 kg   Body mass index is 25.48 kg/m.   Physical examination: General: Looks mildly anxious.  In mild distress on talking and mobility. Cardiovascular: S1-S2 normal.  Regular rate rhythm.  Tachycardic. Respiratory: Bilateral clear.  No added sounds.  Poor air entry. Gastrointestinal: Soft.  Tenderness around the colostomy site.  There is a stool and air in the colostomy bag. Ext: No cyanosis or edema. Neuro: Intact.  Data Reviewed: I have personally reviewed following labs and imaging studies  CBC: Recent Labs  Lab 08/30/20 0742 08/31/20 0645 09/01/20 0421 09/02/20 0452 09/03/20 0409  WBC 17.5* 29.6* 24.6* 18.7* 18.4*  HGB 11.3* 11.2* 10.2* 9.3* 8.0*  HCT 31.2* 31.2* 28.8* 26.9* 23.3*  MCV 90.7 90.7 92.0 93.7 95.1  PLT 264 306 205 187 184   Basic Metabolic Panel: Recent Labs  Lab 08/30/20 0742 08/31/20 0645 09/01/20 0421 09/02/20 0452 09/03/20 0409  NA 130* 131* 133* 131* 134*  K 4.0 3.7 4.1 3.7 4.2  CL 104 106 106 103 102  CO2 18* 18* 20* 22 25  GLUCOSE 106* 119* 150* 141* 220*  BUN 13 13 11 9 10   CREATININE 0.91 0.64 0.58 0.51 0.47  CALCIUM 7.0* 7.4* 7.3* 7.2* 7.5*  MG 1.5* 1.5* 1.3* 1.4* 1.7  PHOS 3.4 2.0* 2.3* 2.3* 2.7   GFR: Estimated Creatinine Clearance: 68.2 mL/min (by C-G formula based on SCr of 0.47 mg/dL). Liver Function Tests: Recent Labs  Lab 08/29/20 0406 08/30/20 0742  08/31/20 0645  AST 28 26 24   ALT 18 14 16   ALKPHOS 49 23* 34*  BILITOT 1.9* 1.2 0.9  PROT 5.1* 3.8* 4.4*  ALBUMIN 2.0* 2.0* 2.0*   No results for input(s): LIPASE, AMYLASE in the last 168 hours.  No results for input(s): AMMONIA in the last 168 hours. Coagulation Profile: No results for input(s): INR, PROTIME in the last 168 hours. Cardiac Enzymes: No results for input(s): CKTOTAL, CKMB, CKMBINDEX, TROPONINI in the last 168 hours. BNP (last 3 results) No results for input(s): PROBNP in the last 8760 hours. HbA1C: No results for input(s): HGBA1C in the last 72 hours. CBG: Recent Labs  Lab 09/02/20 2011 09/02/20 2355 09/03/20 0349 09/03/20 0838 09/03/20 1153  GLUCAP 193* 218* 131* 202* 195*   Lipid Profile: No results for input(s): CHOL, HDL, LDLCALC, TRIG, CHOLHDL, LDLDIRECT in the last 72 hours. Thyroid Function Tests: No results for input(s): TSH, T4TOTAL, FREET4, T3FREE, THYROIDAB in the last 72 hours. Anemia Panel: Recent Labs    09/02/20 1358  FERRITIN 791*   Sepsis Labs: Recent Labs  Lab 08/28/20 2143 08/29/20 0925  LATICACIDVEN 2.5* 2.6*    Recent Results (from the past 240 hour(s))  SARS CORONAVIRUS 2 (TAT 6-24 HRS) Nasopharyngeal Nasopharyngeal Swab     Status: Abnormal   Collection Time: 09/01/20  7:53 AM   Specimen: Nasopharyngeal Swab  Result Value Ref Range Status   SARS Coronavirus 2 POSITIVE (A) NEGATIVE Final    Comment: (NOTE) SARS-CoV-2 target nucleic acids are DETECTED.  The SARS-CoV-2 RNA is generally detectable in upper and lower respiratory specimens during the acute phase of infection. Positive results are indicative of the presence of SARS-CoV-2 RNA. Clinical correlation with patient history and other diagnostic information is  necessary to determine patient infection status. Positive results do not rule out bacterial infection or co-infection with other viruses.  The expected result is Negative.  Fact Sheet for  Patients: HairSlick.no  Fact Sheet for Healthcare Providers: quierodirigir.com  This test is not yet approved or cleared by the [place] FDA and  has been authorized for detection and/or diagnosis of SARS-CoV-2 by FDA under an Emergency Use Authorization (EUA). This EUA will remain  in effect (meaning this test can be used) for the duration of the COVID-19 declaration under Section 564(b)(1) of the Act, 21 U. S.C. section 360bbb-3(b)(1), unless the authorization is terminated or revoked sooner.   Performed at [facility], [address] [place], [place] [address]       Radiology Studies: CT Angio Chest Pulmonary Embolism (PE) W or WO Contrast  Result Date: 09/02/2020 CLINICAL DATA:  Hypoxia.  COVID-19 infection. EXAM: CT ANGIOGRAPHY CHEST WITH CONTRAST TECHNIQUE: Multidetector CT imaging of the chest was performed using the standard protocol during bolus administration of intravenous contrast. Multiplanar CT image reconstructions and MIPs were obtained to evaluate the vascular anatomy. CONTRAST:  34mL OMNIPAQUE IOHEXOL 350 MG/ML SOLN COMPARISON:  Chest radiograph, 08/31/2020. FINDINGS: Cardiovascular: Pulmonary arteries are satisfactorily opacified. Study mildly limited due to low lung volumes and respiratory motion. Allowing for this, there is no evidence of a pulmonary embolism. Heart is normal in size. No pericardial effusion. Three-vessel coronary artery calcifications. Great vessels are normal in caliber. Mild aortic atherosclerosis. No dissection. Mediastinum/Nodes: No enlarged mediastinal, hilar, or axillary lymph nodes. Thyroid gland, trachea, and esophagus demonstrate no significant findings. Right sided PICC has its tip just entering the right atrium. Lungs/Pleura: Moderate right pleural effusion. Trace left pleural fluid. There is a sub pulmonic loculated component of the pleural effusion that contains non dependent  air, and measures 10 x 8 x 2.2 cm. This could potentially reside underneath the hemidiaphragm. It does create a concave indentation of the underlying liver contour. Right lower lobe is collapsed. There is partial atelectasis of the left lower lobe. Patchy areas of predominantly ground-glass opacity are noted bilaterally, the most apparent in the peripheral left upper lobe. Additional mild linear atelectasis is noted at the bases of the left upper lobe lingula and right middle lobe. No pneumothorax. Upper Abdomen: Air-fluid levels noted in the stomach visualized right upper quadrant bowel. Small amount of ascites. Musculoskeletal: No fracture or acute finding.  No bone lesion. Review of the MIP images confirms the above findings. IMPRESSION: 1. No evidence of a pulmonary embolism. 2. Peripheral areas of ground-glass opacity in the lungs consistent with multifocal pneumonia, pattern also consistent with viral/COVID-19 infection. 3. Complete right and partial left lower lobe atelectasis. 4. Moderate right and trace left pleural effusions. 5. Right base fluid collection  containing nondependent air, which may be a sub pulmonic loculated fusion or subdiaphragmatic in location. If this is subdiaphragmatic, the source of the non dependent air is unclear. The non dependent air favors a subpulmonic location. 6. Coronary artery calcifications and mild aortic atherosclerosis. Aortic Atherosclerosis (ICD10-I70.0). Electronically Signed   By: Amie Portlandavid  Ormond M.D.   On: 09/02/2020 20:31    Scheduled Meds:  acetaminophen  650 mg Oral Q6H   Chlorhexidine Gluconate Cloth  6 each Topical Daily   enoxaparin (LOVENOX) injection  40 mg Subcutaneous Q24H   feeding supplement  1 Container Oral TID BM   insulin aspart  0-6 Units Subcutaneous Q8H   methocarbamol  1,000 mg Oral TID   methylPREDNISolone (SOLU-MEDROL) injection  40 mg Intravenous Q12H   pantoprazole (PROTONIX) IV  40 mg Intravenous Q24H   sodium chloride flush  10-40  mL Intracatheter Q12H   thiamine injection  100 mg Intravenous Daily   Continuous Infusions:  ertapenem 1,000 mg (09/02/20 1518)   promethazine (PHENERGAN) injection (IM or IVPB)     remdesivir 100 mg in NS 100 mL 100 mg (09/03/20 1020)   TPN ADULT (ION) 55 mL/hr at 09/02/20 1725   TPN ADULT (ION)       LOS: 11 days   Total time spent: 25 minutes  Dorcas CarrowKuber Doryan Bahl, MD [facility] If 7PM-7AM, please contact night-coverage 09/03/2020, 1:13 PM

## 2020-09-03 NOTE — Progress Notes (Signed)
Progress Note  5 Days Post-Op  Subjective: Patient reports some cramping gas pains overnight, trying to wiggle in the bed to help mobilize but then sats get low. She tolerated sips of clears yesterday without nausea or vomiting. She is still having stool out from colostomy.   Objective: Vital signs in last 24 hours: Temp:  [98 F (36.7 C)-98.9 F (37.2 C)] 98 F (36.7 C) (08/21 0840) Pulse Rate:  [74-98] 76 (08/21 0840) Resp:  [17-19] 17 (08/21 0840) BP: (126-147)/(65-85) 140/77 (08/21 0840) SpO2:  [97 %] 97 % (08/21 0840) Weight:  [71.6 kg] 71.6 kg (08/21 0500) Last BM Date: 08/28/20  Intake/Output from previous day: 08/20 0701 - 08/21 0700 In: 1275 [I.V.:534.5; IV Piggyback:740.5] Out: 1450 [Urine:1450] Intake/Output this shift: No intake/output data recorded.  PE: General: anxious Heart: RRR Lungs: effort non labored. Sats around 97% on 3L Abd: soft, appropriately tender, midline wound is clean and packed.  Colostomy with pink stoma and small amount dark brown soft stool. JP is SS   Lab Results:  Recent Labs    09/02/20 0452 09/03/20 0409  WBC 18.7* 18.4*  HGB 9.3* 8.0*  HCT 26.9* 23.3*  PLT 187 184   BMET Recent Labs    09/02/20 0452 09/03/20 0409  NA 131* 134*  K 3.7 4.2  CL 103 102  CO2 22 25  GLUCOSE 141* 220*  BUN 9 10  CREATININE 0.51 0.47  CALCIUM 7.2* 7.5*   PT/INR No results for input(s): LABPROT, INR in the last 72 hours. CMP     Component Value Date/Time   NA 134 (L) 09/03/2020 0409   K 4.2 09/03/2020 0409   CL 102 09/03/2020 0409   CO2 25 09/03/2020 0409   GLUCOSE 220 (H) 09/03/2020 0409   BUN 10 09/03/2020 0409   CREATININE 0.47 09/03/2020 0409   CREATININE 1.01 (H) 04/04/2020 0000   CALCIUM 7.5 (L) 09/03/2020 0409   PROT 4.4 (L) 08/31/2020 0645   ALBUMIN 2.0 (L) 08/31/2020 0645   AST 24 08/31/2020 0645   ALT 16 08/31/2020 0645   ALKPHOS 34 (L) 08/31/2020 0645   BILITOT 0.9 08/31/2020 0645   GFRNONAA >60 09/03/2020 0409    GFRNONAA 57 (L) 04/04/2020 0000   GFRAA 66 04/04/2020 0000   Lipase     Component Value Date/Time   LIPASE 20 08/23/2020 1341       Studies/Results: CT Angio Chest Pulmonary Embolism (PE) W or WO Contrast  Result Date: 09/02/2020 CLINICAL DATA:  Hypoxia.  COVID-19 infection. EXAM: CT ANGIOGRAPHY CHEST WITH CONTRAST TECHNIQUE: Multidetector CT imaging of the chest was performed using the standard protocol during bolus administration of intravenous contrast. Multiplanar CT image reconstructions and MIPs were obtained to evaluate the vascular anatomy. CONTRAST:  31mL OMNIPAQUE IOHEXOL 350 MG/ML SOLN COMPARISON:  Chest radiograph, 08/31/2020. FINDINGS: Cardiovascular: Pulmonary arteries are satisfactorily opacified. Study mildly limited due to low lung volumes and respiratory motion. Allowing for this, there is no evidence of a pulmonary embolism. Heart is normal in size. No pericardial effusion. Three-vessel coronary artery calcifications. Great vessels are normal in caliber. Mild aortic atherosclerosis. No dissection. Mediastinum/Nodes: No enlarged mediastinal, hilar, or axillary lymph nodes. Thyroid gland, trachea, and esophagus demonstrate no significant findings. Right sided PICC has its tip just entering the right atrium. Lungs/Pleura: Moderate right pleural effusion. Trace left pleural fluid. There is a sub pulmonic loculated component of the pleural effusion that contains non dependent air, and measures 10 x 8 x 2.2 cm. This could potentially  reside underneath the hemidiaphragm. It does create a concave indentation of the underlying liver contour. Right lower lobe is collapsed. There is partial atelectasis of the left lower lobe. Patchy areas of predominantly ground-glass opacity are noted bilaterally, the most apparent in the peripheral left upper lobe. Additional mild linear atelectasis is noted at the bases of the left upper lobe lingula and right middle lobe. No pneumothorax. Upper Abdomen:  Air-fluid levels noted in the stomach visualized right upper quadrant bowel. Small amount of ascites. Musculoskeletal: No fracture or acute finding.  No bone lesion. Review of the MIP images confirms the above findings. IMPRESSION: 1. No evidence of a pulmonary embolism. 2. Peripheral areas of ground-glass opacity in the lungs consistent with multifocal pneumonia, pattern also consistent with viral/COVID-19 infection. 3. Complete right and partial left lower lobe atelectasis. 4. Moderate right and trace left pleural effusions. 5. Right base fluid collection containing nondependent air, which may be a sub pulmonic loculated fusion or subdiaphragmatic in location. If this is subdiaphragmatic, the source of the non dependent air is unclear. The non dependent air favors a subpulmonic location. 6. Coronary artery calcifications and mild aortic atherosclerosis. Aortic Atherosclerosis (ICD10-I70.0). Electronically Signed   By: Amie Portland M.D.   On: 09/02/2020 20:31    Anti-infectives: Anti-infectives (From admission, onward)    Start     Dose/Rate Route Frequency Ordered Stop   09/03/20 1000  remdesivir 100 mg in sodium chloride 0.9 % 100 mL IVPB       See Hyperspace for full Linked Orders Report.   100 mg 200 mL/hr over 30 Minutes Intravenous Daily 09/02/20 1414 09/05/20 0959   09/02/20 1500  remdesivir 200 mg in sodium chloride 0.9% 250 mL IVPB       See Hyperspace for full Linked Orders Report.   200 mg 580 mL/hr over 30 Minutes Intravenous Once 09/02/20 1414 09/02/20 2312   08/27/20 1530  ertapenem (INVANZ) 1,000 mg in sodium chloride 0.9 % 100 mL IVPB        1 g 200 mL/hr over 30 Minutes Intravenous Every 24 hours 08/27/20 1406     08/24/20 1145  ceFEPIme (MAXIPIME) 2 g in sodium chloride 0.9 % 100 mL IVPB  Status:  Discontinued        2 g 200 mL/hr over 30 Minutes Intravenous Every 12 hours 08/24/20 1051 08/27/20 1406   08/23/20 2100  ceFEPIme (MAXIPIME) 2 g in sodium chloride 0.9 % 100 mL IVPB   Status:  Discontinued        2 g 200 mL/hr over 30 Minutes Intravenous Every 24 hours 08/23/20 2051 08/24/20 1051   08/23/20 1700  ciprofloxacin (CIPRO) IVPB 400 mg  Status:  Discontinued        400 mg 200 mL/hr over 60 Minutes Intravenous Every 12 hours 08/23/20 1645 08/23/20 2041   08/23/20 1700  metroNIDAZOLE (FLAGYL) IVPB 500 mg  Status:  Discontinued        500 mg 100 mL/hr over 60 Minutes Intravenous Every 8 hours 08/23/20 1645 08/27/20 1406        Assessment/Plan POD#5 s/p ex lap and Hartman's procedure with colostomy by AR 8/16 for diverticulitis with abscess - JP drain with serosanguinous fluid - amt not recorded for last 24 hrs - WBC stable at 18.  On Invanz - tolerating sips/chips and having stool output - advance to CLD - add simethicone for gas related pains - start transitioning to PO pain control - Continue BID dressing changes - TNA started  8/17, continue for now, will start weaning once pt tolerating FLD - IS/pulm toilet - mobilize   FEN: CLD, TNA ID: ertapenem 8/14>> VTE: scds, lovenox Foley: out 8/17, voiding   HTN AKI COVID+ - per primary team   LOS: 11 days    Juliet Rude, [facility] Surgery 09/03/2020, 8:45 AM Please see Amion for pager number during day hours 7:00am-4:30pm

## 2020-09-04 DIAGNOSIS — K5792 Diverticulitis of intestine, part unspecified, without perforation or abscess without bleeding: Secondary | ICD-10-CM | POA: Diagnosis not present

## 2020-09-04 LAB — GLUCOSE, CAPILLARY
Glucose-Capillary: 204 mg/dL — ABNORMAL HIGH (ref 70–99)
Glucose-Capillary: 210 mg/dL — ABNORMAL HIGH (ref 70–99)
Glucose-Capillary: 213 mg/dL — ABNORMAL HIGH (ref 70–99)
Glucose-Capillary: 222 mg/dL — ABNORMAL HIGH (ref 70–99)
Glucose-Capillary: 223 mg/dL — ABNORMAL HIGH (ref 70–99)
Glucose-Capillary: 227 mg/dL — ABNORMAL HIGH (ref 70–99)
Glucose-Capillary: 253 mg/dL — ABNORMAL HIGH (ref 70–99)

## 2020-09-04 LAB — TRIGLYCERIDES
Triglycerides: UNDETERMINED mg/dL (ref <150)
Triglycerides: 99 mg/dL (ref <150)

## 2020-09-04 LAB — CBC
HCT: 25.6 % — ABNORMAL LOW (ref 36.0–46.0)
Hemoglobin: 8.3 g/dL — ABNORMAL LOW (ref 12.0–15.0)
MCH: 33.6 pg (ref 26.0–34.0)
MCHC: 32.4 g/dL (ref 30.0–36.0)
MCV: 103.6 fL — ABNORMAL HIGH (ref 80.0–100.0)
Platelets: 341 10*3/uL (ref 150–400)
RBC: 2.47 MIL/uL — ABNORMAL LOW (ref 3.87–5.11)
RDW: 14.8 % (ref 11.5–15.5)
WBC: 26.3 10*3/uL — ABNORMAL HIGH (ref 4.0–10.5)
nRBC: 0 % (ref 0.0–0.2)

## 2020-09-04 LAB — D-DIMER, QUANTITATIVE: D-Dimer, Quant: 16.37 ug/mL-FEU — ABNORMAL HIGH (ref 0.00–0.50)

## 2020-09-04 MED ORDER — TRAVASOL 10 % IV SOLN
INTRAVENOUS | Status: AC
  Filled 2020-09-04: qty 840

## 2020-09-04 MED ORDER — AMLODIPINE BESYLATE 10 MG PO TABS
10.0000 mg | ORAL_TABLET | Freq: Every day | ORAL | Status: DC
  Administered 2020-09-04 – 2020-09-08 (×5): 10 mg via ORAL
  Filled 2020-09-04 (×5): qty 1

## 2020-09-04 MED ORDER — INSULIN ASPART 100 UNIT/ML IJ SOLN
0.0000 [IU] | Freq: Four times a day (QID) | INTRAMUSCULAR | Status: DC
  Administered 2020-09-04: 5 [IU] via SUBCUTANEOUS
  Administered 2020-09-04: 8 [IU] via SUBCUTANEOUS
  Administered 2020-09-04: 5 [IU] via SUBCUTANEOUS
  Administered 2020-09-05: 3 [IU] via SUBCUTANEOUS
  Administered 2020-09-05: 5 [IU] via SUBCUTANEOUS
  Administered 2020-09-05: 8 [IU] via SUBCUTANEOUS
  Administered 2020-09-06 (×2): 3 [IU] via SUBCUTANEOUS

## 2020-09-04 NOTE — Progress Notes (Signed)
Occupational Therapy Treatment Patient Details Name: Anne Aguirre MRN: [id number] DOB: Jan 17, 1950 Today's Date: 09/04/2020    History of present illness 70 y.o. female presented 08/23/20 to the emergency department for evaluation of abdominal pain, nausea, and vomiting. 8/12 CT abd +abscess in pelvis; 8/14 Attempt at CT guided aspiration/drainage of pelvic abscess; 8/16 exp lap with colostomy due to perforated diverticulitis PMH-HTN   OT comments  Patient met after toileting task with nursing staff. Provided time to recover before initiation of OT treatment session. Session with focus on activity tolerance in prep for ADLs. Patient continues to be limited by decreased cardiopulmonary status requiring 4L O2 via [place], generalized weakness, and poor activity tolerance. Patient would benefit from continued acute OT services in prep for safe d/c to next level of care. Recommendation for HHOT remains appropriate.    Follow Up Recommendations  Home health OT;Supervision/Assistance - 24 hour    Equipment Recommendations  3 in 1 bedside commode    Recommendations for Other Services      Precautions / Restrictions Precautions Precautions: Fall Precaution Comments: colostomy, R lower abdomen JP Restrictions Weight Bearing Restrictions: No       Mobility Bed Mobility Overal bed mobility: Needs Assistance Bed Mobility: Supine to Sit Rolling: Supervision Sidelying to sit: Min assist Supine to sit: Min assist;HOB elevated Sit to supine: Min assist;HOB elevated   General bed mobility comments: Supervision A for rolling to R with increased time 2/2 fatigue. Min A and increased time to elevate trunk.    Transfers Overall transfer level: Needs assistance Equipment used: Rolling walker (2 wheeled) Transfers: Sit to/from Stand Sit to Stand: Mod assist;Min assist;From elevated surface         General transfer comment: Heavy Min A for sit to stand from elevated EOB with cues for hand  placement.    Balance Overall balance assessment: Needs assistance Sitting-balance support: No upper extremity supported;Feet supported Sitting balance-Leahy Scale: Fair     Standing balance support: Bilateral upper extremity supported;During functional activity Standing balance-Leahy Scale: Poor Standing balance comment: reliant on external support                           ADL either performed or assessed with clinical judgement   ADL Overall ADL's : Needs assistance/impaired                                             Vision       Perception     Praxis      Cognition Arousal/Alertness: Awake/alert Behavior During Therapy: WFL for tasks assessed/performed Overall Cognitive Status: No family/caregiver present to determine baseline cognitive functioning                                 General Comments: Pt with increased processing time to follow cues, at times requires repeated verbal cuing to follow mobility commands.        Exercises General Exercises - Lower Extremity Quad Sets: AROM;Both;10 reps;Supine Straight Leg Raises: AROM;Both;10 reps;Supine   Shoulder Instructions       General Comments 4L O2 via [place]. Desat to mid 80's with transfer to recliner (poor pleth). Back to high 90's once seated in recliner.    Pertinent Vitals/ Pain  Pain Assessment: Faces Faces Pain Scale: Hurts little more Pain Location: abd Pain Descriptors / Indicators: Constant;Discomfort;Operative site guarding Pain Intervention(s): Limited activity within patient's tolerance;Monitored during session;Repositioned  Home Living                                          Prior Functioning/Environment              Frequency  Min 2X/week        Progress Toward Goals  OT Goals(current goals can now be found in the care plan section)  Progress towards OT goals: Progressing toward goals  Acute Rehab OT  Goals Patient Stated Goal: to feel better and get stronger to return home. OT Goal Formulation: With patient Time For Goal Achievement: 09/15/20 ADL Goals Pt Will Perform Grooming: with modified independence;standing;with adaptive equipment Pt Will Perform Upper Body Dressing: with modified independence;sitting Pt Will Perform Lower Body Dressing: with modified independence;sit to/from stand Pt Will Transfer to Toilet: with modified independence;ambulating;regular height toilet;bedside commode  Plan Discharge plan remains appropriate;Frequency remains appropriate    Co-evaluation                 AM-PAC OT "6 Clicks" Daily Activity     Outcome Measure   Help from another person eating meals?: A Little Help from another person taking care of personal grooming?: A Little Help from another person toileting, which includes using toliet, bedpan, or urinal?: A Little Help from another person bathing (including washing, rinsing, drying)?: A Lot Help from another person to put on and taking off regular upper body clothing?: A Little Help from another person to put on and taking off regular lower body clothing?: A Lot 6 Click Score: 16    End of Session Equipment Utilized During Treatment: Oxygen  OT Visit Diagnosis: Unsteadiness on feet (R26.81);Muscle weakness (generalized) (M62.81);Pain   Activity Tolerance Patient limited by fatigue;Patient tolerated treatment well   Patient Left in chair;with call bell/phone within reach;with chair alarm set   Nurse Communication Mobility status        Time: 8295-6213 OT Time Calculation (min): 26 min  Charges: OT General Charges $OT Visit: 1 Visit OT Treatments $Therapeutic Activity: 23-37 mins  Aiman Sonn H. OTR/L Supplemental OT, Department of rehab services [phone]   Alma Mohiuddin R H. 09/04/2020, 2:03 PM

## 2020-09-04 NOTE — Progress Notes (Signed)
PHARMACY - TOTAL PARENTERAL NUTRITION CONSULT NOTE   Indication: Prolonged ileus  Patient Measurements: Height: 5\' 6"  (167.6 cm) Weight: 73.5 kg (162 lb 0.6 oz) IBW/kg (Calculated) : 59.3 TPN AdjBW (KG): 56.2 Body mass index is 26.15 kg/m. Usual Weight: ~60kg  Assessment: 30 YOF presenting with sigmoid diverticulitis, with perforation and abscess.  Now s/p Hartman's with colostomy, post-op ileus and consulted for initiation of TPN.  **Covid 19 + 8/19**  Glucose / Insulin: A1c 5 (03/2020), CBGs 190s-220s 6 unit of insulin given - vSSI, continuing on Solu-Medrol  Electrolytes: Labs were drawn 8/22, but unable to run likely contaminated from TPN as drawn from PICC line. Ordered repeat labs for AM with guidance to draw from Midline. Has been stable over the last week. Last labs 8/21 K 3.7>4.2, Na 131>134, Mg 1.7, Phos 2.3>2.7; CoCa 9 Renal:  SCr 0.47, BUN 10 Hepatic: AST/ALT wnl, Tbili 0.9, TG 77>>633 (likely contaminated by TPN, repeat ordered from [facility]) Intake / Output; MIVF: UOP 1.4L/24hr, Drain 125 mL GI Imaging: 8/10 CT abd: Sigmoid diverticulitis acute 8/12 CT abd: Developing abscess, possible rupture 8/20 CT PE: no PE, multi-focal PNA vs COVID  GI Surgeries / Procedures:  8/14: IR drain attempt 8/16 Hartman's procedure w/ colostomy  Central access: PICC 8/17, Midline 8/15 TPN start date: 8/17  Nutritional Goals: RD Assessment:  Estimated Needs Total Energy Estimated Needs: 1600-1800 Total Protein Estimated Needs: 80-90 grams Total Fluid Estimated Needs: >1.6L/d  Goal rate 70 ml/hr to provide 84 gm protein, 252 gm CHO, 50 gm lipid which provides 1697 Kcal  Current Nutrition:  NPO, trial sips/chips 8/21 Intake -2/3 1 Boost Carton - 1 jello Plan for 8/22 intake:  -1 Boost, jello, ginger ale  -Advanced to Full liquid diet today  Plan:  Continue TPN at goal 70 mL/hr (AA 84gm, 1697 kcal/day, 100% of estimated need) Electrolytes in TPN: Inc Na 39mEq/L, K 13mEq/L,  Ca 63mEq/L, Mg 15mEq/L, and Phos 25 mmol/L. Cl:Ac 1:2 Ordered repeat TG level from midline for this afternoon. Will f/u Continue standard MVI and trace elements to TPN Change to moderate SSI and adjust as needed  F/u po intake as patient is advanced to full liquid diet TPN labs in AM along with Mon, Thurs. Please draw further labs from Mid[facility].  Previous labs drawn from PICC, may have impacted results.  Thank you,  9m, PharmD Clinical Pharmacist  Please check AMION for all [facility] Pharmacy numbers After 10:00 PM, call Main Pharmacy [phone]

## 2020-09-04 NOTE — TOC Progression Note (Signed)
Transition of Care [facility]) - Progression Note    Patient Details  Name: Anne Aguirre MRN: [id number] Date of Birth: Jul 31, 1950  Transition of Care [facility]) CM/SW Contact  Lorri Frederick, LCSW Phone Number: 09/04/2020, 9:11 AM  Clinical Narrative:  TC from pt husband Brett Canales.  He has called a number of PCP options in [place] and all of them cannot see pt until late fall to establish care, asking for assistance.  CSW asked him to contact [facility] Medicare directly and ask for assistance--call CSW back if still unable to locate provider.      Expected Discharge Plan: Home w Home Health Services Barriers to Discharge: Continued Medical Work up  Expected Discharge Plan and Services Expected Discharge Plan: Home w Home Health Services     Post Acute Care Choice: Home Health Living arrangements for the past 2 months: Single Family Home                           HH Arranged: PT [facility] Agency: [facility] Date [facility] Agency Contacted: 08/31/20 Time HH Agency Contacted: 1402 Representative spoke with at [facility] Agency: Neysa Bonito   Social Determinants of Health (SDOH) Interventions    Readmission Risk Interventions No flowsheet data found.

## 2020-09-04 NOTE — Progress Notes (Signed)
PROGRESS NOTE   Anne Aguirre  OKH:[id number] DOB: April 22, 1950 DOA: 08/23/2020 PCP: Pcp, No    Brief Narrative:  70 y.o. female with past medical history of hypertension presented to the hospital with abdominal pain, nausea, vomiting and cramps.  Patient was constipated prior to presentation.  Patient was unable to tolerate any food.  In the ED, patient was noted to have a diverticulitis with developing abscess.  Patient was then considered for admission to [facility] for further IR intervention and surgical follow-up.    During hospitalization, patient was seen by interventional radiology but there was no drainable abscess so he was treated initially conservatively with Invanz and general surgery followed-up.  Patient however persisted to have increasing abdominal pain persistent leukocytosis and elevated lactate, so patient underwent a Hartmann procedure on  on 08/29/2020.  Postoperatively, patient has been been having some difficulty breathing and persist to have ileus.  She has been started on TPN.  She has tested incidentally positive for COVID since her husband had recently been positive.  Assessment & Plan:   Principal Problem:   Acute diverticulitis Active Problems:   Essential hypertension, benign   Acute renal failure superimposed on stage 3a chronic kidney disease (HCC)   Hyponatremia   Hyperbilirubinemia   Hypercalcemia  Acute sigmoid diverticulitis,  with perforation and peritonitis with severe sepsis present on admission Status post colonic resection and colostomy (Hartmann procedure) due to worsening abdominal symptoms.  Postop day 6 today.  General surgery on board.  On  Invanz.  Leukocytosis has slightly trended down.  Continue TPN for prolonged ileus.  Advancing to clear liquid today.  Anxiety.  Likely exacerbating breathing issues.  On Ativan IV as needed.   Acute hypoxemic respiratory failure, not present on admission.  Secondary to COVID-19 pneumonia. Negative  test on admission 8/10 Positive test on 8/19.  Elevated inflammatory markers including CRP of 17, D-dimer 13.  CTA of the chest with bilateral groundglass opacities consistent with COVID-pneumonia.  Negative for pulmonary embolism.  Continue to monitor due to significant symptoms  chest physiotherapy, incentive spirometry, deep breathing exercises, sputum induction, mucolytic's and bronchodilators. Supplemental oxygen to keep saturations more than 90%. Covid directed therapy with , steroids, Solu-Medrol remdesivir, remdesivir for 5 days Due to severity of symptoms, patient will need daily inflammatory markers, chest x-rays, liver function test to monitor and direct COVID-19 therapies.  Postoperative ileus, on TPN at this time.  Continue to monitor closely.  Advancing diet.  Replace magnesium.  Postoperative pain. on morphine , muscle relaxant.  Controlled   Essential hypertension  Patient is on amlodipine, Avapro and nadolol at home.  Blood pressure currently stable.  As needed antihypertensives.  Continue to mobilize.  Can discontinue cardiac telemetry and increase mobility.   DVT prophylaxis: SCD's  Code Status:  Full code  Family Communication:  None today.  Status is: Inpatient  Remains inpatient appropriate because:Inpatient level of care appropriate due to severity of illness, on IV antibiotics, postoperative ileus on TPN.    Dispo: The patient is from: Home              Anticipated d/c is to: Home with home health as per PT evaluation.  We will continue to evaluate during hospitalization              Patient currently is not medically stable to d/c.   Difficult to place patient No   Consultants:  General Surgery IR  Procedures:  Status post Hartmann procedure with colostomy on  08/29/2020  Antimicrobials: Ertapenem  Subjective: Patient seen and examined.  Overnight events noted.  Denies any nausea vomiting.  She had a lot of gas on her bag and some liquids.   Still has some abdominal bloating.  Unable to take deep breaths.  Afebrile. Loses breath on going to the bathroom. She was wondering about her multiple monitors including cardiac telemetry.   Objective:  Vitals:   09/03/20 2200 09/03/20 2358 09/04/20 0321 09/04/20 0549  BP: (!) 148/77 (!) 152/82    Pulse: 75 91    Resp:  (!) 26    Temp: 98 F (36.7 C) 97.6 F (36.4 C) 98.3 F (36.8 C)   TempSrc: Oral Oral Axillary   SpO2:  95%    Weight:    73.5 kg  Height:        Intake/Output Summary (Last 24 hours) at 09/04/2020 1142 Last data filed at 09/03/2020 2157 Gross per 24 hour  Intake 120 ml  Output 125 ml  Net -5 ml   Filed Weights   09/01/20 0500 09/03/20 0500 09/04/20 0549  Weight: 73.5 kg 71.6 kg 73.5 kg   Body mass index is 26.15 kg/m.   Physical examination: General: Looks mildly anxious.  In mild distress on talking and mobility. Cardiovascular: S1-S2 normal.  Regular rate rhythm.  Tachycardic. Respiratory: Bilateral clear.  No added sounds.  Poor air entry. Gastrointestinal: Soft.  Tenderness around the colostomy site.  There is a stool and air in the colostomy bag. Ext: No cyanosis or edema. Neuro: Intact.  Data Reviewed: I have personally reviewed following labs and imaging studies  CBC: Recent Labs  Lab 08/31/20 0645 09/01/20 0421 09/02/20 0452 09/03/20 0409 09/04/20 1034  WBC 29.6* 24.6* 18.7* 18.4* 26.3*  HGB 11.2* 10.2* 9.3* 8.0* 8.3*  HCT 31.2* 28.8* 26.9* 23.3* 25.6*  MCV 90.7 92.0 93.7 95.1 103.6*  PLT 306 205 187 184 341   Basic Metabolic Panel: Recent Labs  Lab 08/30/20 0742 08/31/20 0645 09/01/20 0421 09/02/20 0452 09/03/20 0409  NA 130* 131* 133* 131* 134*  K 4.0 3.7 4.1 3.7 4.2  CL 104 106 106 103 102  CO2 18* 18* 20* 22 25  GLUCOSE 106* 119* 150* 141* 220*  BUN 13 13 11 9 10   CREATININE 0.91 0.64 0.58 0.51 0.47  CALCIUM 7.0* 7.4* 7.3* 7.2* 7.5*  MG 1.5* 1.5* 1.3* 1.4* 1.7  PHOS 3.4 2.0* 2.3* 2.3* 2.7   GFR: Estimated  Creatinine Clearance: 69.1 mL/min (by C-G formula based on SCr of 0.47 mg/dL). Liver Function Tests: Recent Labs  Lab 08/29/20 0406 08/30/20 0742 08/31/20 0645  AST 28 26 24   ALT 18 14 16   ALKPHOS 49 23* 34*  BILITOT 1.9* 1.2 0.9  PROT 5.1* 3.8* 4.4*  ALBUMIN 2.0* 2.0* 2.0*   No results for input(s): LIPASE, AMYLASE in the last 168 hours.  No results for input(s): AMMONIA in the last 168 hours. Coagulation Profile: No results for input(s): INR, PROTIME in the last 168 hours. Cardiac Enzymes: No results for input(s): CKTOTAL, CKMB, CKMBINDEX, TROPONINI in the last 168 hours. BNP (last 3 results) No results for input(s): PROBNP in the last 8760 hours. HbA1C: No results for input(s): HGBA1C in the last 72 hours. CBG: Recent Labs  Lab 09/03/20 2201 09/03/20 2354 09/04/20 0304 09/04/20 0548 09/04/20 0748  GLUCAP 213* 213* 227* 223* 210*   Lipid Profile: Recent Labs    09/04/20 0915  TRIG 633*   Thyroid Function Tests: No results for input(s): TSH, T4TOTAL,  FREET4, T3FREE, THYROIDAB in the last 72 hours. Anemia Panel: Recent Labs    09/02/20 1358  FERRITIN 791*   Sepsis Labs: Recent Labs  Lab 08/28/20 2143 08/29/20 0925  LATICACIDVEN 2.5* 2.6*    Recent Results (from the past 240 hour(s))  SARS CORONAVIRUS 2 (TAT 6-24 HRS) Nasopharyngeal Nasopharyngeal Swab     Status: Abnormal   Collection Time: 09/01/20  7:53 AM   Specimen: Nasopharyngeal Swab  Result Value Ref Range Status   SARS Coronavirus 2 POSITIVE (A) NEGATIVE Final    Comment: (NOTE) SARS-CoV-2 target nucleic acids are DETECTED.  The SARS-CoV-2 RNA is generally detectable in upper and lower respiratory specimens during the acute phase of infection. Positive results are indicative of the presence of SARS-CoV-2 RNA. Clinical correlation with patient history and other diagnostic information is  necessary to determine patient infection status. Positive results do not rule out bacterial infection  or co-infection with other viruses.  The expected result is Negative.  Fact Sheet for Patients: HairSlick.nohttps://www.fda.gov/media/138098/download  Fact Sheet for Healthcare Providers: quierodirigir.comhttps://www.fda.gov/media/138095/download  This test is not yet approved or cleared by the [place] FDA and  has been authorized for detection and/or diagnosis of SARS-CoV-2 by FDA under an Emergency Use Authorization (EUA). This EUA will remain  in effect (meaning this test can be used) for the duration of the COVID-19 declaration under Section 564(b)(1) of the Act, 21 U. S.C. section 360bbb-3(b)(1), unless the authorization is terminated or revoked sooner.   Performed at [facility], [address] [place], [place] [address]       Radiology Studies: CT Angio Chest Pulmonary Embolism (PE) W or WO Contrast  Result Date: 09/02/2020 CLINICAL DATA:  Hypoxia.  COVID-19 infection. EXAM: CT ANGIOGRAPHY CHEST WITH CONTRAST TECHNIQUE: Multidetector CT imaging of the chest was performed using the standard protocol during bolus administration of intravenous contrast. Multiplanar CT image reconstructions and MIPs were obtained to evaluate the vascular anatomy. CONTRAST:  52mL OMNIPAQUE IOHEXOL 350 MG/ML SOLN COMPARISON:  Chest radiograph, 08/31/2020. FINDINGS: Cardiovascular: Pulmonary arteries are satisfactorily opacified. Study mildly limited due to low lung volumes and respiratory motion. Allowing for this, there is no evidence of a pulmonary embolism. Heart is normal in size. No pericardial effusion. Three-vessel coronary artery calcifications. Great vessels are normal in caliber. Mild aortic atherosclerosis. No dissection. Mediastinum/Nodes: No enlarged mediastinal, hilar, or axillary lymph nodes. Thyroid gland, trachea, and esophagus demonstrate no significant findings. Right sided PICC has its tip just entering the right atrium. Lungs/Pleura: Moderate right pleural effusion. Trace left pleural fluid. There  is a sub pulmonic loculated component of the pleural effusion that contains non dependent air, and measures 10 x 8 x 2.2 cm. This could potentially reside underneath the hemidiaphragm. It does create a concave indentation of the underlying liver contour. Right lower lobe is collapsed. There is partial atelectasis of the left lower lobe. Patchy areas of predominantly ground-glass opacity are noted bilaterally, the most apparent in the peripheral left upper lobe. Additional mild linear atelectasis is noted at the bases of the left upper lobe lingula and right middle lobe. No pneumothorax. Upper Abdomen: Air-fluid levels noted in the stomach visualized right upper quadrant bowel. Small amount of ascites. Musculoskeletal: No fracture or acute finding.  No bone lesion. Review of the MIP images confirms the above findings. IMPRESSION: 1. No evidence of a pulmonary embolism. 2. Peripheral areas of ground-glass opacity in the lungs consistent with multifocal pneumonia, pattern also consistent with viral/COVID-19 infection. 3. Complete right and  partial left lower lobe atelectasis. 4. Moderate right and trace left pleural effusions. 5. Right base fluid collection containing nondependent air, which may be a sub pulmonic loculated fusion or subdiaphragmatic in location. If this is subdiaphragmatic, the source of the non dependent air is unclear. The non dependent air favors a subpulmonic location. 6. Coronary artery calcifications and mild aortic atherosclerosis. Aortic Atherosclerosis (ICD10-I70.0). Electronically Signed   By: Amie Portland M.D.   On: 09/02/2020 20:31    Scheduled Meds:  acetaminophen  650 mg Oral Q6H   Chlorhexidine Gluconate Cloth  6 each Topical Daily   enoxaparin (LOVENOX) injection  40 mg Subcutaneous Q24H   feeding supplement  1 Container Oral TID BM   insulin aspart  0-15 Units Subcutaneous Q6H   methocarbamol  1,000 mg Oral TID   methylPREDNISolone (SOLU-MEDROL) injection  40 mg Intravenous  Q12H   pantoprazole (PROTONIX) IV  40 mg Intravenous Q24H   sodium chloride flush  10-40 mL Intracatheter Q12H   thiamine injection  100 mg Intravenous Daily   Continuous Infusions:  ertapenem 1,000 mg (09/03/20 1550)   promethazine (PHENERGAN) injection (IM or IVPB)     TPN ADULT (ION) 70 mL/hr at 09/03/20 1802     LOS: 12 days   Total time spent: 25 minutes  Dorcas Carrow, MD [facility] If 7PM-7AM, please contact night-coverage 09/04/2020, 11:42 AM

## 2020-09-04 NOTE — Progress Notes (Signed)
Physical Therapy Treatment Patient Details Name: Anne Aguirre MRN: [id number] DOB: [birth year] Today's Date: 09/04/2020    History of Present Illness 70 y.o. female presented 08/23/20 to the emergency department for evaluation of abdominal pain, nausea, and vomiting. 8/12 CT abd +abscess in pelvis; 8/14 Attempt at CT guided aspiration/drainage of pelvic abscess; 8/16 exp lap with colostomy due to perforated diverticulitis PMH-HTN    PT Comments    Pt with slightly increased processing time and mild difficulty following mobility commands today. Pt ambulatory for 20 ft in room with RW, overall requiring min-mod assist for mobility at this time. Pt with SPO2 86-94% on 3-4LO2 via [place] with accompanying tachypnea to 30s, cues for breathing technique and rest breaks throughout mobility. PT encouraged pt and RN to mobilize pt OOB multiple times a day with assist, pt and RN express understanding. Will continue to follow.    Follow Up Recommendations  Home health PT;Supervision for mobility/OOB     Equipment Recommendations  Rolling walker with 5" wheels    Recommendations for Other Services       Precautions / Restrictions Precautions Precautions: Fall Precaution Comments: colostomy, R lower abdomen JP Restrictions Weight Bearing Restrictions: No    Mobility  Bed Mobility Overal bed mobility: Needs Assistance Bed Mobility: Supine to Sit;Sit to Supine     Supine to sit: Min assist;HOB elevated Sit to supine: Min assist;HOB elevated   General bed mobility comments: Min assist for truncal management, lifting LEs back into bed.    Transfers Overall transfer level: Needs assistance Equipment used: Rolling walker (2 wheeled) Transfers: Sit to/from Stand Sit to Stand: Mod assist;Min assist;From elevated surface         General transfer comment: Initially attempted stand without AD, required mod boost assist and steadying. Min assist for boost/steady with use of RW. STS x3, from EOB each  time.  Ambulation/Gait Ambulation/Gait assistance: Min assist Gait Distance (Feet): 20 Feet Assistive device: Rolling walker (2 wheeled) Gait Pattern/deviations: Shuffle;Step-through pattern;Decreased stride length;Trunk flexed Gait velocity: decr   General Gait Details: min assist to steady, physically maneuver RW, tactile/verbal cuing for upright posture and room navigation. SpO19min 86% on 4LO2 via [place], HRmax 137 bpm, RRmax 30s breath/min, cues for breathing technique and standing rest breaks as needed.   Stairs             Wheelchair Mobility    Modified Rankin (Stroke Patients Only)       Balance Overall balance assessment: Needs assistance Sitting-balance support: No upper extremity supported;Feet supported Sitting balance-Leahy Scale: Fair     Standing balance support: Bilateral upper extremity supported;During functional activity Standing balance-Leahy Scale: Poor Standing balance comment: reliant on external support                            Cognition Arousal/Alertness: Awake/alert Behavior During Therapy: WFL for tasks assessed/performed Overall Cognitive Status: No family/caregiver present to determine baseline cognitive functioning                                 General Comments: Pt with increased processing time to follow cues, at times requires repeated verbal cuing to follow mobility commands.      Exercises      General Comments        Pertinent Vitals/Pain Pain Assessment: Faces Faces Pain Scale: Hurts little more Pain Location: abd Pain Descriptors / Indicators: Constant;Discomfort;Operative site  guarding Pain Intervention(s): Limited activity within patient's tolerance;Repositioned;Monitored during session    Home Living                      Prior Function            PT Goals (current goals can now be found in the care plan section) Acute Rehab PT Goals Patient Stated Goal: to feel better and get  stronger to return home. PT Goal Formulation: With patient Time For Goal Achievement: 09/14/20 Potential to Achieve Goals: Good Progress towards PT goals: Progressing toward goals    Frequency    Min 3X/week      PT Plan Current plan remains appropriate    Co-evaluation              AM-PAC PT "6 Clicks" Mobility   Outcome Measure  Help needed turning from your back to your side while in a flat bed without using bedrails?: A Little Help needed moving from lying on your back to sitting on the side of a flat bed without using bedrails?: A Little Help needed moving to and from a bed to a chair (including a wheelchair)?: A Little Help needed standing up from a chair using your arms (e.g., wheelchair or bedside chair)?: A Little Help needed to walk in hospital room?: A Little Help needed climbing 3-5 steps with a railing? : A Lot 6 Click Score: 17    End of Session Equipment Utilized During Treatment: Oxygen Activity Tolerance: Patient limited by fatigue;Treatment limited secondary to medical complications (Comment) (increased RR and HR) Patient left: in bed;with call bell/phone within reach;with bed alarm set;with nursing/sitter in room Nurse Communication: Mobility status PT Visit Diagnosis: Unsteadiness on feet (R26.81);Pain Pain - Right/Left:  (left) Pain - part of body:  (abdomen)     Time: 3582-5189 PT Time Calculation (min) (ACUTE ONLY): 24 min  Charges:  $Gait Training: 8-22 mins $Therapeutic Activity: 8-22 mins                     Marye Round, PT DPT Acute Rehabilitation Services Pager [phone]  Office [phone]    [name] 09/04/2020, 10:57 AM

## 2020-09-04 NOTE — Progress Notes (Signed)
6 Days Post-Op  Subjective: CC: Patient had some leakage of stool from ostomy bag this morning. RN has already replaced this with a 1 piece bag. No output or air since in bag since that time. She had 1 jello yesterday w/o n/v. She doesn't like cld options. Drain ss. Voiding. Some soreness around midline, otherwise no abdominal pain.   Patient is very sob just moving around in bed. She reports she walked to the bathroom with assistance yesterday x2 and was up to the chair for 15 minutes. PT rec HH. She walked 44ft with PT on 8/20. PT is on the schedule to work with her again today.   Objective: Vital signs in last 24 hours: Temp:  [97.6 F (36.4 C)-98.3 F (36.8 C)] 98.3 F (36.8 C) (08/22 0321) Pulse Rate:  [71-91] 91 (08/21 2358) Resp:  [17-26] 26 (08/21 2358) BP: (133-152)/(77-93) 152/82 (08/21 2358) SpO2:  [95 %-98 %] 95 % (08/21 2358) Weight:  [73.5 kg] 73.5 kg (08/22 0549) Last BM Date: 09/03/20  Intake/Output from previous day: 08/21 0701 - 08/22 0700 In: 120 [P.O.:120] Out: 225 [Drains:125; Stool:100] Intake/Output this shift: No intake/output data recorded.  PE: Gen:  Alert, NAD, pleasant Card:  RRR Pulm:  CTAB, on 3L o2 Abd: Soft, appropriately tender, midline wound is clean. Colostomy without air. Very small amount of stool at ostomy. Unable to see ostomy 2/2 stool. JP is SS Ext:  Trace b/l LE edema without calf tenderness Psych: A&Ox3  Skin: no rashes noted, warm and dry  Lab Results:  Recent Labs    09/02/20 0452 09/03/20 0409  WBC 18.7* 18.4*  HGB 9.3* 8.0*  HCT 26.9* 23.3*  PLT 187 184   BMET Recent Labs    09/02/20 0452 09/03/20 0409  NA 131* 134*  K 3.7 4.2  CL 103 102  CO2 22 25  GLUCOSE 141* 220*  BUN 9 10  CREATININE 0.51 0.47  CALCIUM 7.2* 7.5*   PT/INR No results for input(s): LABPROT, INR in the last 72 hours. CMP     Component Value Date/Time   NA 134 (L) 09/03/2020 0409   K 4.2 09/03/2020 0409   CL 102 09/03/2020 0409    CO2 25 09/03/2020 0409   GLUCOSE 220 (H) 09/03/2020 0409   BUN 10 09/03/2020 0409   CREATININE 0.47 09/03/2020 0409   CREATININE 1.01 (H) 04/04/2020 0000   CALCIUM 7.5 (L) 09/03/2020 0409   PROT 4.4 (L) 08/31/2020 0645   ALBUMIN 2.0 (L) 08/31/2020 0645   AST 24 08/31/2020 0645   ALT 16 08/31/2020 0645   ALKPHOS 34 (L) 08/31/2020 0645   BILITOT 0.9 08/31/2020 0645   GFRNONAA >60 09/03/2020 0409   GFRNONAA 57 (L) 04/04/2020 0000   GFRAA 66 04/04/2020 0000   Lipase     Component Value Date/Time   LIPASE 20 08/23/2020 1341    Studies/Results: CT Angio Chest Pulmonary Embolism (PE) W or WO Contrast  Result Date: 09/02/2020 CLINICAL DATA:  Hypoxia.  COVID-19 infection. EXAM: CT ANGIOGRAPHY CHEST WITH CONTRAST TECHNIQUE: Multidetector CT imaging of the chest was performed using the standard protocol during bolus administration of intravenous contrast. Multiplanar CT image reconstructions and MIPs were obtained to evaluate the vascular anatomy. CONTRAST:  11mL OMNIPAQUE IOHEXOL 350 MG/ML SOLN COMPARISON:  Chest radiograph, 08/31/2020. FINDINGS: Cardiovascular: Pulmonary arteries are satisfactorily opacified. Study mildly limited due to low lung volumes and respiratory motion. Allowing for this, there is no evidence of a pulmonary embolism. Heart is normal in  size. No pericardial effusion. Three-vessel coronary artery calcifications. Great vessels are normal in caliber. Mild aortic atherosclerosis. No dissection. Mediastinum/Nodes: No enlarged mediastinal, hilar, or axillary lymph nodes. Thyroid gland, trachea, and esophagus demonstrate no significant findings. Right sided PICC has its tip just entering the right atrium. Lungs/Pleura: Moderate right pleural effusion. Trace left pleural fluid. There is a sub pulmonic loculated component of the pleural effusion that contains non dependent air, and measures 10 x 8 x 2.2 cm. This could potentially reside underneath the hemidiaphragm. It does create a  concave indentation of the underlying liver contour. Right lower lobe is collapsed. There is partial atelectasis of the left lower lobe. Patchy areas of predominantly ground-glass opacity are noted bilaterally, the most apparent in the peripheral left upper lobe. Additional mild linear atelectasis is noted at the bases of the left upper lobe lingula and right middle lobe. No pneumothorax. Upper Abdomen: Air-fluid levels noted in the stomach visualized right upper quadrant bowel. Small amount of ascites. Musculoskeletal: No fracture or acute finding.  No bone lesion. Review of the MIP images confirms the above findings. IMPRESSION: 1. No evidence of a pulmonary embolism. 2. Peripheral areas of ground-glass opacity in the lungs consistent with multifocal pneumonia, pattern also consistent with viral/COVID-19 infection. 3. Complete right and partial left lower lobe atelectasis. 4. Moderate right and trace left pleural effusions. 5. Right base fluid collection containing nondependent air, which may be a sub pulmonic loculated fusion or subdiaphragmatic in location. If this is subdiaphragmatic, the source of the non dependent air is unclear. The non dependent air favors a subpulmonic location. 6. Coronary artery calcifications and mild aortic atherosclerosis. Aortic Atherosclerosis (ICD10-I70.0). Electronically Signed   By: Amie [place] M.D.   On: 09/02/2020 20:31    Anti-infectives: Anti-infectives (From admission, onward)    Start     Dose/Rate Route Frequency Ordered Stop   09/03/20 1000  remdesivir 100 mg in sodium chloride 0.9 % 100 mL IVPB       See Hyperspace for full Linked Orders Report.   100 mg 200 mL/hr over 30 Minutes Intravenous Daily 09/02/20 1414 09/05/20 0959   09/02/20 1500  remdesivir 200 mg in sodium chloride 0.9% 250 mL IVPB       See Hyperspace for full Linked Orders Report.   200 mg 580 mL/hr over 30 Minutes Intravenous Once 09/02/20 1414 09/02/20 2312   08/27/20 1530  ertapenem  (INVANZ) 1,000 mg in sodium chloride 0.9 % 100 mL IVPB        1 g 200 mL/hr over 30 Minutes Intravenous Every 24 hours 08/27/20 1406     08/24/20 1145  ceFEPIme (MAXIPIME) 2 g in sodium chloride 0.9 % 100 mL IVPB  Status:  Discontinued        2 g 200 mL/hr over 30 Minutes Intravenous Every 12 hours 08/24/20 1051 08/27/20 1406   08/23/20 2100  ceFEPIme (MAXIPIME) 2 g in sodium chloride 0.9 % 100 mL IVPB  Status:  Discontinued        2 g 200 mL/hr over 30 Minutes Intravenous Every 24 hours 08/23/20 2051 08/24/20 1051   08/23/20 1700  ciprofloxacin (CIPRO) IVPB 400 mg  Status:  Discontinued        400 mg 200 mL/hr over 60 Minutes Intravenous Every 12 hours 08/23/20 1645 08/23/20 2041   08/23/20 1700  metroNIDAZOLE (FLAGYL) IVPB 500 mg  Status:  Discontinued        500 mg 100 mL/hr over 60 Minutes Intravenous Every 8  hours 08/23/20 1645 08/27/20 1406        Assessment/Plan POD#6 s/p ex lap and Hartman's procedure with colostomy by AR 8/16 for diverticulitis with abscess - JP drain with serosanguinous fluid - continue  - WBC pending this am.  On Invanz - Adv to FLD for options. Not eating much.  - TNA started 8/17. Await increased PO intake before weaning TNA  - start transitioning to PO pain control - Continue BID dressing changes - IS/pulm toilet - mobilize, PT rec HH    FEN: FLD, TNA ID: ertapenem 8/14>> VTE: scds, lovenox Foley: out 8/17, voiding   HTN AKI COVID+ - per primary team, on o2. CTA negative for PE 8/20. On remdesivir and solu-medrol   LOS: 12 days    Jacinto Halim , [facility] 09/04/2020, 8:35 AM Please see Amion for pager number during day hours 7:00am-4:30pm

## 2020-09-05 DIAGNOSIS — K5792 Diverticulitis of intestine, part unspecified, without perforation or abscess without bleeding: Secondary | ICD-10-CM | POA: Diagnosis not present

## 2020-09-05 LAB — CBC
HCT: 26.7 % — ABNORMAL LOW (ref 36.0–46.0)
Hemoglobin: 9 g/dL — ABNORMAL LOW (ref 12.0–15.0)
MCH: 32.6 pg (ref 26.0–34.0)
MCHC: 33.7 g/dL (ref 30.0–36.0)
MCV: 96.7 fL (ref 80.0–100.0)
Platelets: 335 10*3/uL (ref 150–400)
RBC: 2.76 MIL/uL — ABNORMAL LOW (ref 3.87–5.11)
RDW: 14.1 % (ref 11.5–15.5)
WBC: 20.6 10*3/uL — ABNORMAL HIGH (ref 4.0–10.5)
nRBC: 0 % (ref 0.0–0.2)

## 2020-09-05 LAB — BASIC METABOLIC PANEL
Anion gap: 9 (ref 5–15)
BUN: 15 mg/dL (ref 8–23)
CO2: 25 mmol/L (ref 22–32)
Calcium: 8.1 mg/dL — ABNORMAL LOW (ref 8.9–10.3)
Chloride: 101 mmol/L (ref 98–111)
Creatinine, Ser: 0.53 mg/dL (ref 0.44–1.00)
GFR, Estimated: >60 mL/min (ref >60)
Glucose, Bld: 239 mg/dL — ABNORMAL HIGH (ref 70–99)
Potassium: 4.7 mmol/L (ref 3.5–5.1)
Sodium: 135 mmol/L (ref 135–145)

## 2020-09-05 LAB — GLUCOSE, CAPILLARY
Glucose-Capillary: 178 mg/dL — ABNORMAL HIGH (ref 70–99)
Glucose-Capillary: 193 mg/dL — ABNORMAL HIGH (ref 70–99)
Glucose-Capillary: 236 mg/dL — ABNORMAL HIGH (ref 70–99)
Glucose-Capillary: 259 mg/dL — ABNORMAL HIGH (ref 70–99)

## 2020-09-05 LAB — D-DIMER, QUANTITATIVE: D-Dimer, Quant: 15.07 ug/mL-FEU — ABNORMAL HIGH (ref 0.00–0.50)

## 2020-09-05 LAB — MAGNESIUM: Magnesium: 1.5 mg/dL — ABNORMAL LOW (ref 1.7–2.4)

## 2020-09-05 LAB — PHOSPHORUS: Phosphorus: 3.3 mg/dL (ref 2.5–4.6)

## 2020-09-05 MED ORDER — METHYLPREDNISOLONE SODIUM SUCC 40 MG IJ SOLR
40.0000 mg | Freq: Every day | INTRAMUSCULAR | Status: DC
  Administered 2020-09-06 – 2020-09-07 (×2): 40 mg via INTRAVENOUS
  Filled 2020-09-05 (×2): qty 1

## 2020-09-05 MED ORDER — TRAVASOL 10 % IV SOLN
INTRAVENOUS | Status: AC
  Filled 2020-09-05: qty 840

## 2020-09-05 MED ORDER — MAGNESIUM SULFATE 4 GM/100ML IV SOLN
4.0000 g | Freq: Once | INTRAVENOUS | Status: AC
  Administered 2020-09-05: 4 g via INTRAVENOUS
  Filled 2020-09-05: qty 100

## 2020-09-05 MED ORDER — ENOXAPARIN SODIUM 40 MG/0.4ML IJ SOSY
40.0000 mg | PREFILLED_SYRINGE | INTRAMUSCULAR | Status: DC
  Administered 2020-09-05 – 2020-09-07 (×3): 40 mg via SUBCUTANEOUS
  Filled 2020-09-05 (×3): qty 0.4

## 2020-09-05 NOTE — Plan of Care (Signed)
  Problem: Education: Goal: Knowledge of General Education information will improve Description: Including pain rating scale, medication(s)/side effects and non-pharmacologic comfort measures Outcome: Progressing   Problem: Pain Managment: Goal: General experience of comfort will improve Outcome: Progressing   

## 2020-09-05 NOTE — Progress Notes (Addendum)
PHARMACY - TOTAL PARENTERAL NUTRITION CONSULT NOTE   Indication: Prolonged ileus  Patient Measurements: Height: 5\' 6"  (167.6 cm) Weight: 72.5 kg (159 lb 13.3 oz) IBW/kg (Calculated) : 59.3 TPN AdjBW (KG): 56.2 Body mass index is 25.8 kg/m. Usual Weight: ~60kg  Assessment: 75 YOF presenting with sigmoid diverticulitis, with perforation and abscess.  Now s/p Hartman's with colostomy, post-op ileus and consulted for initiation of TPN.  **Covid 19 + 8/19**  Glucose / Insulin: A1c 5 (03/2020), CBGs 180-200s 20 unit of insulin given - adjusted to moderate SSI 8/22, continuing on Solu-Medrol (LOT?) Electrolytes: Labs delayed again due to draw from PICC line.  Reordered and phlebotomy drew. Unable to be processed prior to 12pm due time.  Renal:   Hepatic: AST/ALT wnl, Tbili 0.9, 8/22 TG 77>>99  Intake / Output; MIVF: UOP 751 mL/24hr, Drain 10 mL GI Imaging: 8/10 CT abd: Sigmoid diverticulitis acute 8/12 CT abd: Developing abscess, possible rupture 8/20 CT PE: no PE, multi-focal PNA vs COVID  GI Surgeries / Procedures:  8/14: IR drain attempt 8/16 Hartman's procedure w/ colostomy  Central access: PICC 8/17, Midline 8/15 TPN start date: 8/17  Nutritional Goals: RD Assessment:  Estimated Needs Total Energy Estimated Needs: 1600-1800 Total Protein Estimated Needs: 80-90 grams Total Fluid Estimated Needs: >1.6L/d  Goal rate 70 ml/hr to provide 84 gm protein, 252 gm CHO, 50 gm lipid which provides 1697 Kcal  Current Nutrition:  NPO, trial sips/chips 8/23 Intake -1 Boost Carton - 1 jello  -1 ginger ale  -1 juice  -On Full liquid diet>>adv to soft diet  Plan:  -Continue TPN at goal 70 mL/hr (AA 84gm, 1697 kcal/day, 100% of estimated need) -Electrolytes in TPN: Continue Na 34mEq/L, Decrease K 37mEq/L, Ca 69mEq/L, Mg 3mEq/L, and Decrease Phos 20 mmol/L (as no new labs). Cl:Ac 1:2 -Empirically decreased K and Phos as no new labs. AM labs drawn from PICC line and were contaminated.   Redrawn labs not processed prior to TPN due time.  -Discussed with provider to maintain full rate of TPN today as patient intake was low yesterday. Provider has advanced patient to soft diet today.  -F/u improved po intake and tolerance of soft diet to guide weaning on TPN in the next few days. -Continue standard MVI and trace elements to TPN -Continue moderate SSI and adjust as needed  -TPN labs Mon,Thurs. Please draw further labs by phlebotomy.   Thank you,  9m, PharmD Clinical Pharmacist  Please check AMION for all [facility] Pharmacy numbers After 10:00 PM, call Main Pharmacy [phone]

## 2020-09-05 NOTE — Progress Notes (Signed)
7 Days Post-Op  Subjective: CC: Doing well. Tolerating fld but only eating small amounts +shakes. No n/v. Some cramping/gas pains on the left side of her abdomen. No n/v. Having colostomy output.   Objective: Vital signs in last 24 hours: Temp:  [97.6 F (36.4 C)-98.1 F (36.7 C)] 97.7 F (36.5 C) (08/23 0747) Pulse Rate:  [68-90] 90 (08/23 0747) Resp:  [20-24] 21 (08/23 0747) BP: (124-175)/(76-90) 124/76 (08/23 0747) SpO2:  [92 %-97 %] 94 % (08/23 0747) Weight:  [72.5 kg] 72.5 kg (08/23 0431) Last BM Date: 09/04/20  Intake/Output from previous day: 08/22 0701 - 08/23 0700 In: 2034.6 [P.O.:360; I.V.:1574.6; IV Piggyback:100] Out: 787 [Urine:752; Drains:35] Intake/Output this shift: No intake/output data recorded.  PE: Gen:  Alert, NAD, pleasant Card:  RRR Pulm:  CTAB, on o2 Abd: Soft, mild tenderness on the left side of her abdomen, midline wound is clean with small area of dehiscence at the base of the wound. No evisceration. Colostomy with air and stool in bag. Unable to see ostomy 2/2 stool. JP is SS Ext:  Trace b/l LE edema without calf tenderness Psych: A&Ox3  Skin: no rashes noted, warm and dry  Lab Results:  Recent Labs    09/04/20 1034 09/05/20 0227  WBC 26.3* 20.6*  HGB 8.3* 9.0*  HCT 25.6* 26.7*  PLT 341 335   BMET Recent Labs    09/03/20 0409  NA 134*  K 4.2  CL 102  CO2 25  GLUCOSE 220*  BUN 10  CREATININE 0.47  CALCIUM 7.5*   PT/INR No results for input(s): LABPROT, INR in the last 72 hours. CMP     Component Value Date/Time   NA 134 (L) 09/03/2020 0409   K 4.2 09/03/2020 0409   CL 102 09/03/2020 0409   CO2 25 09/03/2020 0409   GLUCOSE 220 (H) 09/03/2020 0409   BUN 10 09/03/2020 0409   CREATININE 0.47 09/03/2020 0409   CREATININE 1.01 (H) 04/04/2020 0000   CALCIUM 7.5 (L) 09/03/2020 0409   PROT 4.4 (L) 08/31/2020 0645   ALBUMIN 2.0 (L) 08/31/2020 0645   AST 24 08/31/2020 0645   ALT 16 08/31/2020 0645   ALKPHOS 34 (L)  08/31/2020 0645   BILITOT 0.9 08/31/2020 0645   GFRNONAA >60 09/03/2020 0409   GFRNONAA 57 (L) 04/04/2020 0000   GFRAA 66 04/04/2020 0000   Lipase     Component Value Date/Time   LIPASE 20 08/23/2020 1341    Studies/Results: No results found.  Anti-infectives: Anti-infectives (From admission, onward)    Start     Dose/Rate Route Frequency Ordered Stop   09/03/20 1000  remdesivir 100 mg in sodium chloride 0.9 % 100 mL IVPB       See Hyperspace for full Linked Orders Report.   100 mg 200 mL/hr over 30 Minutes Intravenous Daily 09/02/20 1414 09/04/20 1043   09/02/20 1500  remdesivir 200 mg in sodium chloride 0.9% 250 mL IVPB       See Hyperspace for full Linked Orders Report.   200 mg 580 mL/hr over 30 Minutes Intravenous Once 09/02/20 1414 09/02/20 2312   08/27/20 1530  ertapenem (INVANZ) 1,000 mg in sodium chloride 0.9 % 100 mL IVPB        1 g 200 mL/hr over 30 Minutes Intravenous Every 24 hours 08/27/20 1406     08/24/20 1145  ceFEPIme (MAXIPIME) 2 g in sodium chloride 0.9 % 100 mL IVPB  Status:  Discontinued  2 g 200 mL/hr over 30 Minutes Intravenous Every 12 hours 08/24/20 1051 08/27/20 1406   08/23/20 2100  ceFEPIme (MAXIPIME) 2 g in sodium chloride 0.9 % 100 mL IVPB  Status:  Discontinued        2 g 200 mL/hr over 30 Minutes Intravenous Every 24 hours 08/23/20 2051 08/24/20 1051   08/23/20 1700  ciprofloxacin (CIPRO) IVPB 400 mg  Status:  Discontinued        400 mg 200 mL/hr over 60 Minutes Intravenous Every 12 hours 08/23/20 1645 08/23/20 2041   08/23/20 1700  metroNIDAZOLE (FLAGYL) IVPB 500 mg  Status:  Discontinued        500 mg 100 mL/hr over 60 Minutes Intravenous Every 8 hours 08/23/20 1645 08/27/20 1406        Assessment/Plan POD#7 s/p ex lap and Hartman's procedure with colostomy by AR 8/16 for diverticulitis with abscess - JP drain with serosanguinous fluid - continue  - On Invanz. Cont 10d abx - Patient w/ robf and tol FLD. Adv to soft diet and  start weaning TPN - Continue BID dressing changes - IS/pulm toilet - mobilize, PT rec HH    FEN: Soft, 1/2 TNA ID: ertapenem 8/14>> VTE: scds, lovenox Foley: out 8/17, voiding   HTN AKI COVID+ - per primary team, on o2. CTA negative for PE 8/20. On remdesivir and solu-medrol   LOS: 13 days    Jacinto Halim , [facility] Surgery 09/05/2020, 8:42 AM Please see Amion for pager number during day hours 7:00am-4:30pm

## 2020-09-05 NOTE — Progress Notes (Signed)
Physical Therapy Treatment Patient Details Name: Anne Aguirre MRN: [id number] DOB: 1950-07-26 Today's Date: 09/05/2020    History of Present Illness 70 y.o. female presented 08/23/20 to the emergency department for evaluation of abdominal pain, nausea, and vomiting. 8/12 CT abd +abscess in pelvis; 8/14 Attempt at CT guided aspiration/drainage of pelvic abscess; 8/16 exp lap with colostomy due to perforated diverticulitis PMH-HTN    PT Comments    Pt states she had a rough morning, increased anxiety with reports of a panic attack after breakfast. Pt now okay, and agreeable to mobility. Pt ambulatory for room distance, but requires seated rest break to recover fatigue, dyspnea, and tachycardia up to 143 bpm. Pt with SPO2 min of 85% on 4LO2, recovers well with seated rest and breathing technique cues. Pt overall requiring min assist for mobility at this time, recommendations below remain appropriate.     Follow Up Recommendations  Home health PT;Supervision for mobility/OOB     Equipment Recommendations  Rolling walker with 5" wheels    Recommendations for Other Services       Precautions / Restrictions Precautions Precautions: Fall Precaution Comments: colostomy, R lower abdomen JP Restrictions Weight Bearing Restrictions: No    Mobility  Bed Mobility Overal bed mobility: Needs Assistance Bed Mobility: Rolling;Sidelying to Sit Rolling: Supervision Sidelying to sit: Min guard       General bed mobility comments: supervision-min guard for safety, verbal cuing for sequencing log roll for abdominal protection and comfort.    Transfers Overall transfer level: Needs assistance Equipment used: Rolling walker (2 wheeled) Transfers: Sit to/from Stand Sit to Stand: Min assist         General transfer comment: Min assist for rise with facilitation at pelvis, steadying upon standing. STS x4 throughout session.  Ambulation/Gait Ambulation/Gait assistance: Min assist Gait Distance  (Feet): 12 Feet (x2) Assistive device: Rolling walker (2 wheeled) Gait Pattern/deviations: Shuffle;Step-through pattern;Decreased stride length;Trunk flexed Gait velocity: decr   General Gait Details: min assist to steady, guide pt/RW as pt nearly bumping into objects in room x2. Seated rest break x2 minutes to recover dyspnea with RR 37, SpO2 min 85% on 4LO2 recovering to 90s with rest and breathing technique cues.   Stairs             Wheelchair Mobility    Modified Rankin (Stroke Patients Only)       Balance Overall balance assessment: Needs assistance Sitting-balance support: No upper extremity supported;Feet supported Sitting balance-Leahy Scale: Fair     Standing balance support: Bilateral upper extremity supported;During functional activity Standing balance-Leahy Scale: Poor Standing balance comment: reliant on external support                            Cognition Arousal/Alertness: Awake/alert Behavior During Therapy: WFL for tasks assessed/performed;Anxious Overall Cognitive Status: Within Functional Limits for tasks assessed                                 General Comments: pt states she has been increasingly anxious, states she had a "panic attack" after breakfast but now recovered.      Exercises      General Comments General comments (skin integrity, edema, etc.): 4LO2, SpO2 85-97%, cues for pursed lip breathing technique. HRmax 137 bpm      Pertinent Vitals/Pain Pain Assessment: 0-10 Pain Score: 6  Pain Location: abd Pain Descriptors / Indicators: Constant;Discomfort;Operative site  guarding;Sore Pain Intervention(s): Limited activity within patient's tolerance;Monitored during session;Repositioned    Home Living                      Prior Function            PT Goals (current goals can now be found in the care plan section) Acute Rehab PT Goals Patient Stated Goal: to feel better and get stronger to  return home. PT Goal Formulation: With patient Time For Goal Achievement: 09/14/20 Potential to Achieve Goals: Good Progress towards PT goals: Progressing toward goals    Frequency    Min 3X/week      PT Plan Current plan remains appropriate    Co-evaluation              AM-PAC PT "6 Clicks" Mobility   Outcome Measure  Help needed turning from your back to your side while in a flat bed without using bedrails?: A Little Help needed moving from lying on your back to sitting on the side of a flat bed without using bedrails?: A Little Help needed moving to and from a bed to a chair (including a wheelchair)?: A Little Help needed standing up from a chair using your arms (e.g., wheelchair or bedside chair)?: A Little Help needed to walk in hospital room?: A Little Help needed climbing 3-5 steps with a railing? : A Lot 6 Click Score: 17    End of Session Equipment Utilized During Treatment: Oxygen Activity Tolerance: Patient limited by fatigue Patient left: in bed;with call bell/phone within reach;with bed alarm set;with nursing/sitter in room Nurse Communication: Mobility status PT Visit Diagnosis: Unsteadiness on feet (R26.81);Pain Pain - Right/Left:  (left) Pain - part of body:  (abdomen)     Time: 7782-4235 PT Time Calculation (min) (ACUTE ONLY): 27 min  Charges:  $Gait Training: 8-22 mins $Therapeutic Activity: 8-22 mins                     Marye Round, PT DPT Acute Rehabilitation Services Pager [phone]  Office [phone]    Anne Aguirre 09/05/2020, 10:46 AM

## 2020-09-05 NOTE — Consult Note (Addendum)
WOC Nurse ostomy follow up Surgical team following for assessment and plan of care for abd wound.  Pt had colostomy surgery performed on 8/16 and is in isolation for Covid. Current pouch is leaking behind the barrier. Stoma type/location: LUQ; end colostomy Stomal assessment/size: Flush with skin level, 7/8" x 1" slightly oval shaped, located with creases at 3:00 o'clock and 9:00 o'clock Add belt to attempt to maintain a seal; ostomy is located in close proximity to the wound and it is difficult for the pouch to adhere over the edge of the dressing. Output: mod amt liquid brown stool  Ostomy pouching: 1pc.flexible convex, Hart [place] # [id number] and barrier ring, Hart [place] # [phone]  Education provided:  Husband is ill with Covid at home is is unable to visit at this time. He will need to have teaching sessions performed when he and the patient have recovered fully.  Demonstrated pouch change (cutting new skin barrier, measuring stoma, cleaning peristomal skin and stoma, use of barrier ring) Education on emptying when 1/3 to 1/2 full and how to empty Demonstrated use of wick to clean spout  Pt watched the procedure, using one piece flexible convex pouch, barrier ring, and belt. was able to open and close velcro to empty and asked appropriate questions. Extra supplies ordered for the bedside nurses to use if leaking occurs. Educational materials in the room.    Enrolled patient in [place] Secure Start Discharge program: Yes, previously   WOC Nurse will follow along with you for continued support with ostomy teaching and care. Cammie Mcgee MSN, RN, CWOCN, [place], CNS [phone]

## 2020-09-05 NOTE — Progress Notes (Signed)
PROGRESS NOTE   Anne Aguirre  UUV:[id number] DOB: [birth year] DOA: 08/23/2020 PCP: Pcp, No    Brief Narrative:  70 y.o. female with past medical history of hypertension presented to the hospital with abdominal pain, nausea, vomiting and cramps.  Patient was constipated prior to presentation.  Patient was unable to tolerate any food.  In the ED, patient was noted to have a diverticulitis with developing abscess.  Patient was then considered for admission to [facility] for further IR intervention and surgical follow-up.    During hospitalization, patient was seen by interventional radiology but there was no drainable abscess so he was treated initially conservatively with Invanz and general surgery followed-up.  Patient however persisted to have increasing abdominal pain persistent leukocytosis and elevated lactate, so patient underwent a Hartmann procedure on  on 08/29/2020.  Postoperatively, patient has been been having some difficulty breathing and persist to have ileus.  She has been started on TPN.  She has tested incidentally positive for COVID since her husband had recently been positive.  Assessment & Plan:   Principal Problem:   Acute diverticulitis Active Problems:   Essential hypertension, benign   Acute renal failure superimposed on stage 3a chronic kidney disease (HCC)   Hyponatremia   Hyperbilirubinemia   Hypercalcemia  Acute sigmoid diverticulitis,  with perforation and peritonitis with severe sepsis present on admission Status post colonic resection and colostomy (Hartmann procedure) due to worsening abdominal symptoms.  Postop day 7 today.  General surgery on board.  On  Invanz.  Leukocytosis has slightly trended down but mostly related to steroid use. Continue TPN for prolonged ileus.  Advancing to soft diet today.  Some return of bowel functions.  Anxiety.  Likely exacerbating breathing issues.  On Ativan IV as needed.   Acute hypoxemic respiratory failure, not  present on admission.  Secondary to COVID-19 pneumonia. Negative test on admission 8/10 Positive test on 8/19.  Elevated inflammatory markers including CRP of 17, D-dimer 13.  CTA of the chest 8/20 with bilateral groundglass opacities consistent with COVID-pneumonia.  Negative for pulmonary embolism.  Continue to monitor due to significant symptoms  chest physiotherapy, incentive spirometry, deep breathing exercises, sputum induction, mucolytic's and bronchodilators. Supplemental oxygen to keep saturations more than 90%. Covid directed therapy with , steroids, Solu-Medrol.  We will start tapering off. remdesivir, completed therapy. Due to severity of symptoms, patient will need daily inflammatory markers, chest x-rays, liver function test to monitor and direct COVID-19 therapies. D-dimer was greatly elevated, CTA was negative for PE.  Postoperative ileus, on TPN.  Advancing to diet.  Waiting for diet tolerance.  Postoperative pain. on morphine , muscle relaxant.  Controlled   Essential hypertension  Patient is on amlodipine, Avapro and nadolol at home.  Antihypertensives resumed.  Hypomagnesemia Replaced.  Monitor with TPN.  Continue to mobilize.  Can discontinue cardiac telemetry and increase mobility.   DVT prophylaxis: SCD's  Code Status:  Full code  Family Communication:  Husband on the phone.  Status is: Inpatient  Remains inpatient appropriate because:Inpatient level of care appropriate due to severity of illness, on IV antibiotics, postoperative ileus on TPN.    Dispo: The patient is from: Home              Anticipated d/c is to: Home with home health as per PT evaluation.  We will continue to evaluate during hospitalization              Patient currently is not medically stable to d/c.  Difficult to place patient No   Consultants:  General Surgery IR  Procedures:  Status post Hartmann procedure with colostomy on 08/29/2020  Antimicrobials: Ertapenem  8/14---  Subjective: Patient seen and examined in the morning rounds.  Today, she has some improvement in her breathing.  She looks comfortable on 4 L oxygen.  Was able to take more deep breaths and.  Denies any nausea vomiting.  She has a stool in her colostomy.  Abdomen is still distended but soft and less tense than before.   Objective:  Vitals:   09/05/20 0003 09/05/20 0431 09/05/20 0747 09/05/20 1253  BP: (!) 169/81  124/76 (!) 149/83  Pulse:   90 73  Resp:   (!) 21 (!) 25  Temp: 97.8 F (36.6 C)  97.7 F (36.5 C) 98.4 F (36.9 C)  TempSrc: Oral  Oral Oral  SpO2:   94% 96%  Weight:  72.5 kg    Height:        Intake/Output Summary (Last 24 hours) at 09/05/2020 1343 Last data filed at 09/05/2020 1100 Gross per 24 hour  Intake 1574.63 ml  Output 837 ml  Net 737.63 ml   Filed Weights   09/03/20 0500 09/04/20 0549 09/05/20 0431  Weight: 71.6 kg 73.5 kg 72.5 kg   Body mass index is 25.8 kg/m.   Physical examination: General: Looks mildly anxious.  Otherwise comfortable. Cardiovascular: S1-S2 normal.  Regular rate rhythm.  Tachycardic. Respiratory: Bilateral clear.  No added sounds.  Poor air entry. SpO2: 96 % O2 Flow Rate (L/min): 4 L/min  Gastrointestinal: Soft.  Tenderness around the colostomy site.  There is a stool and air in the colostomy bag. Midline surgical incision with open margins. Ext: No cyanosis or edema. Neuro: Intact.  Data Reviewed: I have personally reviewed following labs and imaging studies  CBC: Recent Labs  Lab 09/01/20 0421 09/02/20 0452 09/03/20 0409 09/04/20 1034 09/05/20 0227  WBC 24.6* 18.7* 18.4* 26.3* 20.6*  HGB 10.2* 9.3* 8.0* 8.3* 9.0*  HCT 28.8* 26.9* 23.3* 25.6* 26.7*  MCV 92.0 93.7 95.1 103.6* 96.7  PLT 205 187 184 341 335   Basic Metabolic Panel: Recent Labs  Lab 08/31/20 0645 09/01/20 0421 09/02/20 0452 09/03/20 0409 09/05/20 1104  NA 131* 133* 131* 134* 135  K 3.7 4.1 3.7 4.2 4.7  CL 106 106 103 102 101  CO2  18* 20* 22 25 25   GLUCOSE 119* 150* 141* 220* 239*  BUN 13 11 9 10 15   CREATININE 0.64 0.58 0.51 0.47 0.53  CALCIUM 7.4* 7.3* 7.2* 7.5* 8.1*  MG 1.5* 1.3* 1.4* 1.7 1.5*  PHOS 2.0* 2.3* 2.3* 2.7 3.3   GFR: Estimated Creatinine Clearance: 68.6 mL/min (by C-G formula based on SCr of 0.53 mg/dL). Liver Function Tests: Recent Labs  Lab 08/30/20 0742 08/31/20 0645  AST 26 24  ALT 14 16  ALKPHOS 23* 34*  BILITOT 1.2 0.9  PROT 3.8* 4.4*  ALBUMIN 2.0* 2.0*   No results for input(s): LIPASE, AMYLASE in the last 168 hours.  No results for input(s): AMMONIA in the last 168 hours. Coagulation Profile: No results for input(s): INR, PROTIME in the last 168 hours. Cardiac Enzymes: No results for input(s): CKTOTAL, CKMB, CKMBINDEX, TROPONINI in the last 168 hours. BNP (last 3 results) No results for input(s): PROBNP in the last 8760 hours. HbA1C: No results for input(s): HGBA1C in the last 72 hours. CBG: Recent Labs  Lab 09/04/20 1755 09/04/20 2019 09/05/20 0441 09/05/20 0803 09/05/20 1230  GLUCAP 204*  222* 178* 193* 236*   Lipid Profile: Recent Labs    09/04/20 0915 09/04/20 1240  TRIG SPECIMEN CONTAMINATED, UNABLE TO PERFORM TEST(S). 99   Thyroid Function Tests: No results for input(s): TSH, T4TOTAL, FREET4, T3FREE, THYROIDAB in the last 72 hours. Anemia Panel: Recent Labs    09/02/20 1358  FERRITIN 791*   Sepsis Labs: No results for input(s): PROCALCITON, LATICACIDVEN in the last 168 hours.   Recent Results (from the past 240 hour(s))  SARS CORONAVIRUS 2 (TAT 6-24 HRS) Nasopharyngeal Nasopharyngeal Swab     Status: Abnormal   Collection Time: 09/01/20  7:53 AM   Specimen: Nasopharyngeal Swab  Result Value Ref Range Status   SARS Coronavirus 2 POSITIVE (A) NEGATIVE Final    Comment: (NOTE) SARS-CoV-2 target nucleic acids are DETECTED.  The SARS-CoV-2 RNA is generally detectable in upper and lower respiratory specimens during the acute phase of infection.  Positive results are indicative of the presence of SARS-CoV-2 RNA. Clinical correlation with patient history and other diagnostic information is  necessary to determine patient infection status. Positive results do not rule out bacterial infection or co-infection with other viruses.  The expected result is Negative.  Fact Sheet for Patients: HairSlick.no  Fact Sheet for Healthcare Providers: quierodirigir.com  This test is not yet approved or cleared by the [place] FDA and  has been authorized for detection and/or diagnosis of SARS-CoV-2 by FDA under an Emergency Use Authorization (EUA). This EUA will remain  in effect (meaning this test can be used) for the duration of the COVID-19 declaration under Section 564(b)(1) of the Act, 21 U. S.C. section 360bbb-3(b)(1), unless the authorization is terminated or revoked sooner.   Performed at [facility], [address] [place], [place] [address]       Radiology Studies: No results found.  Scheduled Meds:  acetaminophen  650 mg Oral Q6H   amLODipine  10 mg Oral Daily   Chlorhexidine Gluconate Cloth  6 each Topical Daily   enoxaparin (LOVENOX) injection  40 mg Subcutaneous Q24H   feeding supplement  1 Container Oral TID BM   insulin aspart  0-15 Units Subcutaneous Q6H   methocarbamol  1,000 mg Oral TID   [START ON 09/06/2020] methylPREDNISolone (SOLU-MEDROL) injection  40 mg Intravenous Daily   pantoprazole (PROTONIX) IV  40 mg Intravenous Q24H   sodium chloride flush  10-40 mL Intracatheter Q12H   thiamine injection  100 mg Intravenous Daily   Continuous Infusions:  ertapenem 1,000 mg (09/05/20 0839)   magnesium sulfate bolus IVPB 4 g (09/05/20 1324)   promethazine (PHENERGAN) injection (IM or IVPB)     TPN ADULT (ION) 70 mL/hr at 09/04/20 1711   TPN ADULT (ION)       LOS: 13 days   Total time spent: 25 minutes  Dorcas Carrow, MD [facility] If  7PM-7AM, please contact night-coverage 09/05/2020, 1:43 PM

## 2020-09-05 NOTE — TOC Progression Note (Signed)
Transition of Care [facility]) - Progression Note    Patient Details  Name: Anne Aguirre MRN: [id number] Date of Birth: 11/03/1950  [facility]) CM/SW Contact  Lorri Frederick, LCSW Phone Number: 09/05/2020, 11:35 AM  Clinical Narrative:  CSW continues to follow for DC needs.       Expected Discharge Plan: Home w Home Health Services Barriers to Discharge: Continued Medical Work up  Expected Discharge Plan and Services Expected Discharge Plan: Home w Home Health Services     Post Acute Care Choice: Home Health Living arrangements for the past 2 months: Single Family Home                           HH Arranged: PT [facility] Agency: [facility] Date [facility] Agency Contacted: 08/31/20 Time HH Agency Contacted: 1402 Representative spoke with at [facility] Agency: Neysa Bonito   Social Determinants of Health (SDOH) Interventions    Readmission Risk Interventions No flowsheet data found.

## 2020-09-06 ENCOUNTER — Inpatient Hospital Stay (HOSPITAL_COMMUNITY): Payer: Medicare PPO

## 2020-09-06 LAB — GLUCOSE, CAPILLARY
Glucose-Capillary: 189 mg/dL — ABNORMAL HIGH (ref 70–99)
Glucose-Capillary: 190 mg/dL — ABNORMAL HIGH (ref 70–99)
Glucose-Capillary: 219 mg/dL — ABNORMAL HIGH (ref 70–99)
Glucose-Capillary: 243 mg/dL — ABNORMAL HIGH (ref 70–99)
Glucose-Capillary: 291 mg/dL — ABNORMAL HIGH (ref 70–99)

## 2020-09-06 LAB — D-DIMER, QUANTITATIVE: D-Dimer, Quant: 13.24 ug/mL-FEU — ABNORMAL HIGH (ref 0.00–0.50)

## 2020-09-06 MED ORDER — TRAVASOL 10 % IV SOLN
INTRAVENOUS | Status: AC
  Filled 2020-09-06: qty 600

## 2020-09-06 MED ORDER — ENSURE ENLIVE PO LIQD
237.0000 mL | Freq: Two times a day (BID) | ORAL | Status: DC
  Administered 2020-09-07: 237 mL via ORAL

## 2020-09-06 MED ORDER — INSULIN ASPART 100 UNIT/ML IJ SOLN
0.0000 [IU] | Freq: Three times a day (TID) | INTRAMUSCULAR | Status: DC
  Administered 2020-09-06 (×2): 5 [IU] via SUBCUTANEOUS
  Administered 2020-09-07: 3 [IU] via SUBCUTANEOUS
  Administered 2020-09-07: 11 [IU] via SUBCUTANEOUS
  Administered 2020-09-07: 8 [IU] via SUBCUTANEOUS

## 2020-09-06 MED ORDER — INSULIN ASPART 100 UNIT/ML IJ SOLN
0.0000 [IU] | Freq: Every day | INTRAMUSCULAR | Status: DC
Start: 2020-09-06 — End: 2020-09-08
  Administered 2020-09-06: 3 [IU] via SUBCUTANEOUS
  Administered 2020-09-07: 2 [IU] via SUBCUTANEOUS

## 2020-09-06 NOTE — Plan of Care (Signed)
  Problem: Education: Goal: Knowledge of General Education information will improve Description: Including pain rating scale, medication(s)/side effects and non-pharmacologic comfort measures Outcome: Progressing   Problem: Activity: Goal: Risk for activity intolerance will decrease Outcome: Progressing   Problem: Nutrition: Goal: Adequate nutrition will be maintained Outcome: Progressing   Problem: Coping: Goal: Level of anxiety will decrease Outcome: Progressing   

## 2020-09-06 NOTE — TOC Progression Note (Signed)
Transition of Care [facility]) - Progression Note    Patient Details  Name: Anne Aguirre MRN: [id number] Date of Birth: Sep 15, 1950  Transition of Care [facility]) CM/SW Contact  Lorri Frederick, LCSW Phone Number: 09/06/2020, 12:46 PM  Clinical Narrative:  CSW spoke with husband Anne Aguirre.  He did speak to [facility] regarding need for new PCP and is waiting on a call back from them for assistance.     Expected Discharge Plan: Home w Home Health Services Barriers to Discharge: Continued Medical Work up  Expected Discharge Plan and Services Expected Discharge Plan: Home w Home Health Services     Post Acute Care Choice: Home Health Living arrangements for the past 2 months: Single Family Home                           HH Arranged: PT [facility] Agency: [facility] Date [facility] Agency Contacted: 08/31/20 Time HH Agency Contacted: 1402 Representative spoke with at [facility] Agency: Neysa Bonito   Social Determinants of Health (SDOH) Interventions    Readmission Risk Interventions No flowsheet data found.

## 2020-09-06 NOTE — Progress Notes (Signed)
Occupational Therapy Treatment Patient Details Name: Anne Aguirre MRN: [id number] DOB: 10-28-1950 Today's Date: 09/06/2020    History of present illness 70 y.o. female presented 08/23/20 to the emergency department for evaluation of abdominal pain, nausea, and vomiting. 8/12 CT abd +abscess in pelvis; 8/14 Attempt at CT guided aspiration/drainage of pelvic abscess; 8/16 exp lap with colostomy due to perforated diverticulitis PMH-HTN   OT comments  Pt received sitting up in chair with husband present at bedside, pleasant and agreeable to OT session. Pt c/o of no pain during session but reports abdominal discomfort last night. Pt is progressing toward established goals with concerns in activity tolerance and endurance (see below for details). Pt required min guard to min A for toilet transfer with use of RW, min guard bed mobility with less physical assistance. Pt will benefit to continue skilled level OT to address established deficits and to increase independence with ADLs prior to dc.   Follow Up Recommendations  Home health OT;Supervision/Assistance - 24 hour    Equipment Recommendations  3 in 1 bedside commode    Recommendations for Other Services      Precautions / Restrictions Precautions Precautions: Fall Precaution Comments: colostomy, R lower abdomen JP       Mobility Bed Mobility Overal bed mobility: Needs Assistance Bed Mobility: Sit to Supine       Sit to supine: HOB elevated;Min guard   General bed mobility comments: min guard for safety and management of lines. No physical assistance required.    Transfers Overall transfer level: Needs assistance Equipment used: Rolling walker (2 wheeled) Transfers: Sit to/from Stand Sit to Stand: Min assist         General transfer comment: Min assist for initial push to stand.verbal cueing for hand placement prior to standing.    Balance Overall balance assessment: Needs assistance Sitting-balance support: No upper  extremity supported;Feet supported Sitting balance-Leahy Scale: Fair     Standing balance support: Bilateral upper extremity supported;During functional activity Standing balance-Leahy Scale: Poor Standing balance comment: reliant on external support                           ADL either performed or assessed with clinical judgement   ADL Overall ADL's : Needs assistance/impaired     Grooming: Wash/dry hands;Set up Grooming Details (indicate cue type and reason): pt given hand sanitizer after toileting at [facility].                 Toilet Transfer: Minimal assistance;Min Pension scheme manager Details (indicate cue type and reason): initialy sit to stand min A to power up with cues for hand placement and sequencing with RW. min guard during progression to ambulate to [facility] with RW.           General ADL Comments: Pt reports feeling slight SOB but not fatigued during toilet transfer, there was an increase in HR >100 bpm, but decreased once pt seated EOB at 97 bpm.     Vision       Perception     Praxis      Cognition Arousal/Alertness: Awake/alert Behavior During Therapy: [facility] for tasks assessed/performed;Anxious Overall Cognitive Status: Within Functional Limits for tasks assessed                                 General Comments: really pleasant and cooperative.        Exercises  Shoulder Instructions       General Comments Pt on RA 96-98% O2 saturation, HR range 94->100 bpm, husband present at bedside.    Pertinent Vitals/ Pain       Pain Assessment: Faces Faces Pain Scale: Hurts a little bit Pain Location: abd Pain Descriptors / Indicators: Constant;Discomfort;Operative site guarding;Sore Pain Intervention(s): Monitored during session (reported increased abdominal discomfort last night.)  Home Living                                          Prior Functioning/Environment              Frequency  Min  2X/week        Progress Toward Goals  OT Goals(current goals can now be found in the care plan section)  Progress towards OT goals: Progressing toward goals  Acute Rehab OT Goals Patient Stated Goal: to feel better and get stronger to return home. OT Goal Formulation: With patient Time For Goal Achievement: 09/15/20 ADL Goals Pt Will Perform Grooming: with modified independence;standing;with adaptive equipment Pt Will Perform Upper Body Dressing: with modified independence;sitting Pt Will Perform Lower Body Dressing: with modified independence;sit to/from stand Pt Will Transfer to Toilet: with modified independence;ambulating;regular height toilet;bedside commode  Plan Discharge plan remains appropriate;Frequency remains appropriate    Co-evaluation                 AM-PAC OT "6 Clicks" Daily Activity     Outcome Measure   Help from another person eating meals?: A Little Help from another person taking care of personal grooming?: A Little Help from another person toileting, which includes using toliet, bedpan, or urinal?: A Little Help from another person bathing (including washing, rinsing, drying)?: A Lot Help from another person to put on and taking off regular upper body clothing?: A Little Help from another person to put on and taking off regular lower body clothing?: A Lot 6 Click Score: 16    End of Session    OT Visit Diagnosis: Unsteadiness on feet (R26.81);Muscle weakness (generalized) (M62.81);Pain   Activity Tolerance Patient tolerated treatment well   Patient Left with call bell/phone within reach;in bed;with family/visitor present   Nurse Communication Mobility status        Time: 5732-2025 OT Time Calculation (min): 20 min  Charges: OT General Charges $OT Visit: 1 Visit OT Treatments $Self Care/Home Management : 8-22 mins  Marquette Old, MSOT, OTR/L  Supplemental Rehabilitation Services  [phone]    Zigmund Daniel 09/06/2020,  4:47 PM

## 2020-09-06 NOTE — Progress Notes (Signed)
PHARMACY - TOTAL PARENTERAL NUTRITION CONSULT NOTE   Indication: Prolonged ileus  Patient Measurements: Height: 5\' 6"  (167.6 cm) Weight: 72.8 kg (160 lb 7.9 oz) IBW/kg (Calculated) : 59.3 TPN AdjBW (KG): 56.2 Body mass index is 25.9 kg/m. Usual Weight: ~60kg  Assessment: 61 YOF presenting with sigmoid diverticulitis, with perforation and abscess.  Now s/p Hartman's with colostomy, post-op ileus and consulted for initiation of TPN.  **Covid 19 + 8/19**  Glucose / Insulin: A1c 5 (03/2020), CBGs 180-200s 20 unit of insulin given - adjusted to moderate SSI 8/22, continuing on Solu-Medrol (LOT?) Electrolytes: 8/23 Na 135, K 4.7, Mg, 1.5, Phos 3.3 Renal:  Scr 0.53, BUN 15 Hepatic: AST/ALT wnl, Tbili 0.9, 8/22 TG 77>>99  Intake / Output; MIVF: UOP 1800 mL/24hr, Drain 35 mL GI Imaging: 8/10 CT abd: Sigmoid diverticulitis acute 8/12 CT abd: Developing abscess, possible rupture 8/20 CT PE: no PE, multi-focal PNA vs COVID  GI Surgeries / Procedures:  8/14: IR drain attempt 8/16 Hartman's procedure w/ colostomy  Central access: PICC 8/17, Midline 8/15 TPN start date: 8/17  Nutritional Goals: RD Assessment:  Estimated Needs Total Energy Estimated Needs: 1600-1800 Total Protein Estimated Needs: 80-90 grams Total Fluid Estimated Needs: >1.6L/d  Goal rate 70 ml/hr to provide 84 gm protein, 252 gm CHO, 50 gm lipid which provides 1697 Kcal  Current Nutrition:  8/24 Intake -Unable to remember breakfast and lunch -Dinner: carrots, mashed potatoes, beef, biscuit (1/3) -Ginger ale, juice, water -Breakfast (8/25) AM: pancake, bacon   Plan:  - Plan to decrease TPN to 50 ml/hr to encourage increased po intake (AA 60gm, 1212 kcal, meeting 70% of estimated need) -Adjust elytes for decreased rate: Increase Na 144mEq/L, Increase K 88mEq/L, Ca 33mEq/L, Increase Mg 35mEq/L, and Increase Phos 25 mmol/L (as no new labs). Cl:Ac 1:2  -Change SSI to mealtime as patient is now on a soft diet. -Glucoses  elevated. Planning to wean steroids and taper down TPN based on oral intake.  -Continue standard MVI and trace elements to TPN -TPN labs Mon,Thurs. Please draw further labs by phlebotomy.   Thank you,  12m, PharmD Clinical Pharmacist  Please check AMION for all [facility] Pharmacy numbers After 10:00 PM, call Main Pharmacy [phone]

## 2020-09-06 NOTE — Progress Notes (Signed)
8 Days Post-Op  Subjective: CC: Doing well. Had some upper abdominal bloating/pressure yesterday that has resolved this am. No current abdominal pain. Denies n/v yesterday or this am. Tolerating soft diet and eating more. Having ostomy output.   Objective: Vital signs in last 24 hours: Temp:  [97.8 F (36.6 C)-98.4 F (36.9 C)] 98.1 F (36.7 C) (08/24 0800) Pulse Rate:  [73-96] 92 (08/24 0800) Resp:  [18-25] 25 (08/24 0800) BP: (137-164)/(74-84) 161/74 (08/24 0800) SpO2:  [95 %-100 %] 98 % (08/24 0800) Weight:  [72.8 kg] 72.8 kg (08/24 0601) Last BM Date: 09/05/20  Intake/Output from previous day: 08/23 0701 - 08/24 0700 In: 100 [IV Piggyback:100] Out: 2760 [Urine:2700; Drains:10; Stool:50] Intake/Output this shift: No intake/output data recorded.  PE: Gen:  Alert, NAD, pleasant Pulm:  Normal rate and effort, on o2 Abd: Soft, mild tenderness around incision that appears appropriate, midline wound is clean with small area of dehiscence at the base of the wound. No evisceration. Colostomy with air and stool in bag. Unable to see ostomy 2/2 stool. JP is SS Psych: A&Ox3  Skin: no rashes noted, warm and dry  Lab Results:  Recent Labs    09/04/20 1034 09/05/20 0227  WBC 26.3* 20.6*  HGB 8.3* 9.0*  HCT 25.6* 26.7*  PLT 341 335   BMET Recent Labs    09/05/20 1104  NA 135  K 4.7  CL 101  CO2 25  GLUCOSE 239*  BUN 15  CREATININE 0.53  CALCIUM 8.1*   PT/INR No results for input(s): LABPROT, INR in the last 72 hours. CMP     Component Value Date/Time   NA 135 09/05/2020 1104   K 4.7 09/05/2020 1104   CL 101 09/05/2020 1104   CO2 25 09/05/2020 1104   GLUCOSE 239 (H) 09/05/2020 1104   BUN 15 09/05/2020 1104   CREATININE 0.53 09/05/2020 1104   CREATININE 1.01 (H) 04/04/2020 0000   CALCIUM 8.1 (L) 09/05/2020 1104   PROT 4.4 (L) 08/31/2020 0645   ALBUMIN 2.0 (L) 08/31/2020 0645   AST 24 08/31/2020 0645   ALT 16 08/31/2020 0645   ALKPHOS 34 (L) 08/31/2020  0645   BILITOT 0.9 08/31/2020 0645   GFRNONAA >60 09/05/2020 1104   GFRNONAA 57 (L) 04/04/2020 0000   GFRAA 66 04/04/2020 0000   Lipase     Component Value Date/Time   LIPASE 20 08/23/2020 1341    Studies/Results: No results found.  Anti-infectives: Anti-infectives (From admission, onward)    Start     Dose/Rate Route Frequency Ordered Stop   09/03/20 1000  remdesivir 100 mg in sodium chloride 0.9 % 100 mL IVPB       See Hyperspace for full Linked Orders Report.   100 mg 200 mL/hr over 30 Minutes Intravenous Daily 09/02/20 1414 09/04/20 1043   09/02/20 1500  remdesivir 200 mg in sodium chloride 0.9% 250 mL IVPB       See Hyperspace for full Linked Orders Report.   200 mg 580 mL/hr over 30 Minutes Intravenous Once 09/02/20 1414 09/02/20 2312   08/27/20 1530  ertapenem (INVANZ) 1,000 mg in sodium chloride 0.9 % 100 mL IVPB        1 g 200 mL/hr over 30 Minutes Intravenous Every 24 hours 08/27/20 1406 09/08/20 2359   08/24/20 1145  ceFEPIme (MAXIPIME) 2 g in sodium chloride 0.9 % 100 mL IVPB  Status:  Discontinued        2 g 200 mL/hr over 30 Minutes  Intravenous Every 12 hours 08/24/20 1051 08/27/20 1406   08/23/20 2100  ceFEPIme (MAXIPIME) 2 g in sodium chloride 0.9 % 100 mL IVPB  Status:  Discontinued        2 g 200 mL/hr over 30 Minutes Intravenous Every 24 hours 08/23/20 2051 08/24/20 1051   08/23/20 1700  ciprofloxacin (CIPRO) IVPB 400 mg  Status:  Discontinued        400 mg 200 mL/hr over 60 Minutes Intravenous Every 12 hours 08/23/20 1645 08/23/20 2041   08/23/20 1700  metroNIDAZOLE (FLAGYL) IVPB 500 mg  Status:  Discontinued        500 mg 100 mL/hr over 60 Minutes Intravenous Every 8 hours 08/23/20 1645 08/27/20 1406        Assessment/Plan POD#8 s/p ex lap and Hartman's procedure with colostomy by AR 8/16 for diverticulitis with abscess - JP drain with serosanguinous fluid - continue  - On Invanz. Cont 10d abx - Patient w/ robf and tol soft diet and inc PO  intake. 1/2 TPN  - Continue BID dressing changes - IS/pulm toilet - mobilize, PT rec HH    FEN: Soft, 1/2 TNA ID: ertapenem 8/14>> VTE: scds, lovenox Foley: out 8/17, voiding   HTN AKI COVID+ - per primary team, on o2. CTA negative for PE 8/20. On remdesivir and solu-medrol   LOS: 14 days    Jacinto Halim , [facility] Surgery 09/06/2020, 11:20 AM Please see Amion for pager number during day hours 7:00am-4:30pm

## 2020-09-06 NOTE — Progress Notes (Signed)
Nutrition Follow-up  DOCUMENTATION CODES:  Not applicable  INTERVENTION:  -48 hour calorie count -d/c Boost Breeze -Ensure Enlive po BID, each supplement provides 350 kcal and 20 grams of protein -Magic cup BID with meals, each supplement provides 290 kcal and 9 grams of protein -Continue TPN management per Pharmacy  NUTRITION DIAGNOSIS:  Inadequate oral intake related to acute illness (diverticulitis) as evidenced by per patient/family report, meal completion < 25%. -- ongoing  GOAL:  Patient will meet greater than or equal to 90% of their needs -- addressing with TPN and supplements  MONITOR:  Labs, Weight trends, I & O's  REASON FOR ASSESSMENT:  Consult New TPN/TNA  ASSESSMENT:  Pt with PMH significant for HTN, HLD, OA presented to the hospital with abdominal pain, N/V, and constipation with an inability to tolerate food. Pt admitted for diverticulitis with perforation and developing abscess with severe sepsis POA.  Pt has tested incidentally positive for COVID.   8/14 s/p failed attempt at CT guided aspiration/drainage of pelvic abscess 8/16 s/p ex lap and Hartman's procedure w/ colostomy 8/17 TPN initiated due to concerns for postop ileus 8/19 COVID positive 8/21 diet advanced to clear liquids 8/22 diet advanced to full liquids  8/23 diet advanced to soft after return of some bowel function  Pt reports improvement in breathing. Denies N/V. Having stool in colostomy. Abdomen distended. Appetite/PO intake inadequate to meet needs; pt eating 0-30% of meals at this time. Per Pharmacy, plan is to reduce TPN rate to 38ml/hr today to encourage po intake. Will provide pt with oral nutrition supplements and initiate calorie count so intake can be monitored more closely.   Medications: Boost Breeze TID, SSI, solu-medrol, protonix, thiamine , IV phenergan Labs: Mg 1.5 (L) CBGs [phone]  UOP: 2.7L x24 hours JP Drain: 62ml output x24 hours Colostomy: 50ml output x24  hours I/O: +5.2L since admit  Diet Order:   Diet Order             DIET SOFT Room service appropriate? Yes; Fluid consistency: Thin  Diet effective now                  EDUCATION NEEDS:  Education needs have been addressed  Skin:  Skin Assessment: Skin Integrity Issues: Skin Integrity Issues:: Incisions Incisions: abdomen  Last BM:  8/23 via colostomy  Height:  Ht Readings from Last 1 Encounters:  08/23/20 5\' 6"  (1.676 m)   Weight:  Wt Readings from Last 1 Encounters:  09/06/20 72.8 kg   BMI:  Body mass index is 25.9 kg/m.  Estimated Nutritional Needs:  Kcal:  1600-1800 Protein:  80-90 grams Fluid:  >1.6L/d    09/08/20, MS, RD, LDN (she/her/hers) RD pager number and weekend/on-call pager number located in Amion.

## 2020-09-06 NOTE — Progress Notes (Signed)
Inpatient Diabetes Program Recommendations  AACE/ADA: New Consensus Statement on Inpatient Glycemic Control (2015)  Target Ranges:  Prepandial:   less than 140 mg/dL      Peak postprandial:   less than 180 mg/dL (1-2 hours)      Critically ill patients:  140 - 180 mg/dL   Lab Results  Component Value Date   GLUCAP 189 (H) 09/06/2020   HGBA1C 5.0 04/04/2020    Review of Glycemic Control Results for Anne Aguirre, Anne Aguirre (MRN [id number]) as of 09/06/2020 11:04  Ref. Range 09/05/2020 08:03 09/05/2020 12:30 09/05/2020 16:28 09/06/2020 00:15 09/06/2020 05:34  Glucose-Capillary Latest Ref Range: 70 - 99 mg/dL 488 (H) 891 (H) 694 (H) 190 (H) 189 (H)   Diabetes history: DM 2 Outpatient Diabetes medications:  None Current orders for Inpatient glycemic control:  Novolog moderate tid with meals and HS Solumedrol 40 mg IV daily TPN 50 cc/hr  Inpatient Diabetes Program Recommendations:    Since TPN is still infusing, consider increasing Novolog q 4 hours.   Thanks,  Beryl Meager, RN, BC-ADM Inpatient Diabetes Coordinator Pager [phone]  (8a-5p)

## 2020-09-06 NOTE — Progress Notes (Signed)
PROGRESS NOTE   Anne Aguirre  HWE:[id number] DOB: Nov 25, 1950 DOA: 08/23/2020 PCP: Pcp, No    Brief Narrative:  70 y.o. female with past medical history of hypertension presented to the hospital with abdominal pain, nausea, vomiting and cramps. Patient was unable to tolerate any food.  In the ED, patient was noted to have a sigmoid diverticulitis with developing abscess.  Patient was then considered for admission to [facility] for further IR intervention and surgical follow-up.    During hospitalization, patient was seen by interventional radiology but there was no drainable abscess so she was treated initially conservatively with Invanz and general surgery followed-up.  Patient however persisted to have increasing abdominal pain, persistent leukocytosis and elevated lactate, so patient underwent a Hartmann procedure on  on 08/29/2020.  Postoperatively, patient has been been having some difficulty breathing and persist to have ileus.  She has been started on TPN.  She tested incidentally positive for COVID as we checked her after her husband tested positive.  Assessment & Plan:   Principal Problem:   Acute diverticulitis Active Problems:   Essential hypertension, benign   Acute renal failure superimposed on stage 3a chronic kidney disease (HCC)   Hyponatremia   Hyperbilirubinemia   Hypercalcemia  Acute sigmoid diverticulitis,  with perforation and peritonitis with severe sepsis present on admission Status post Hartmann procedure with prolonged ileus.   Currently return of bowel function, soft diet.  Decreasing TPN to half dose today.  Electrolytes replaced with TPN protocol.   Midline incision with some dehiscence.  Surgery following.   Mobilize.   Patient is on Invanz, day 11/14  Acute hypoxemic respiratory failure, not present on admission.  Secondary to COVID-19 pneumonia. Negative test on admission 8/10 Positive test on 8/19.  Elevated inflammatory markers including CRP of  17, D-dimer 13.  CTA of the chest 8/20 with bilateral groundglass opacities consistent with COVID-pneumonia.  Negative for pulmonary embolism.  Continue with chest physiotherapy, incentive spirometry, deep breathing exercises, sputum induction, mucolytic's and bronchodilators. Supplemental oxygen to keep saturations more than 90%. Covid directed therapy with , steroids, Solu-Medrol.  We will start tapering off.  Changed to prednisone tomorrow. remdesivir, completed 5 days of therapy.   Repeat chest x-ray today.  Will review. D-dimer was greatly elevated, CTA was negative for PE.  Postoperative ileus, on TPN.  Advancing to diet.  Waiting for diet tolerance.  Postoperative pain. on morphine , muscle relaxant.  Controlled   Essential hypertension  Patient is on amlodipine, Avapro and nadolol at home.  Antihypertensives resumed.  Hypomagnesemia Replaced.  Monitor with TPN.  Recheck tomorrow.  Continue to mobilize.  Can discontinue cardiac telemetry and increase mobility.   DVT prophylaxis: SCD's  Code Status:  Full code  Family Communication:  Husband on the phone 8/23.  He can visit as he suffered from COVID-19 and living in same household.  Status is: Inpatient  Remains inpatient appropriate because:Inpatient level of care appropriate due to severity of illness, on IV antibiotics, postoperative ileus on TPN.    Dispo: The patient is from: Home              Anticipated d/c is to: Home with home health as per PT evaluation.  We will continue to evaluate during hospitalization              Patient currently is not medically stable to d/c.   Difficult to place patient No   Consultants:  General Surgery IR  Procedures:  Status post Gertie Gowda  procedure with colostomy on 08/29/2020  Antimicrobials: Ertapenem 8/14---  Subjective: Patient seen and examined in the morning rounds.  Somehow improvement of respiratory status.  She was able to go to the bathroom with oxygen.  She had  episode of bloating and distention last night that improved.  Flatus and some stool in the colostomy.   Objective:  Vitals:   09/05/20 2134 09/06/20 0536 09/06/20 0601 09/06/20 0800  BP: (!) 164/81 137/84  (!) 161/74  Pulse: 96 77  92  Resp: (!) 21 18  (!) 25  Temp: 98 F (36.7 C) 97.8 F (36.6 C)  98.1 F (36.7 C)  TempSrc: Oral Oral  Oral  SpO2: 95% 100%  98%  Weight:   72.8 kg   Height:        Intake/Output Summary (Last 24 hours) at 09/06/2020 1145 Last data filed at 09/06/2020 0536 Gross per 24 hour  Intake 100 ml  Output 2010 ml  Net -1910 ml   Filed Weights   09/04/20 0549 09/05/20 0431 09/06/20 0601  Weight: 73.5 kg 72.5 kg 72.8 kg   Body mass index is 25.9 kg/m.   Physical examination: General: looks fairly comfortable. Cardiovascular: S1-S2 normal.  Regular rate rhythm.   Respiratory: Bilateral clear.  No added sounds.  Poor air entry. SpO2: 98 % O2 Flow Rate (L/min): 5 L/min  Gastrointestinal: Soft but distended.  Tenderness around the colostomy site.  There is a stool and air in the colostomy bag. Midline surgical incision with dehiscence lower incision. JP drain with some serosanguineous discharge. Ext: No cyanosis or edema. Neuro: Intact.  Data Reviewed: I have personally reviewed following labs and imaging studies  CBC: Recent Labs  Lab 09/01/20 0421 09/02/20 0452 09/03/20 0409 09/04/20 1034 09/05/20 0227  WBC 24.6* 18.7* 18.4* 26.3* 20.6*  HGB 10.2* 9.3* 8.0* 8.3* 9.0*  HCT 28.8* 26.9* 23.3* 25.6* 26.7*  MCV 92.0 93.7 95.1 103.6* 96.7  PLT 205 187 184 341 335   Basic Metabolic Panel: Recent Labs  Lab 08/31/20 0645 09/01/20 0421 09/02/20 0452 09/03/20 0409 09/05/20 1104  NA 131* 133* 131* 134* 135  K 3.7 4.1 3.7 4.2 4.7  CL 106 106 103 102 101  CO2 18* 20* 22 25 25   GLUCOSE 119* 150* 141* 220* 239*  BUN 13 11 9 10 15   CREATININE 0.64 0.58 0.51 0.47 0.53  CALCIUM 7.4* 7.3* 7.2* 7.5* 8.1*  MG 1.5* 1.3* 1.4* 1.7 1.5*  PHOS 2.0*  2.3* 2.3* 2.7 3.3   GFR: Estimated Creatinine Clearance: 68.7 mL/min (by C-G formula based on SCr of 0.53 mg/dL). Liver Function Tests: Recent Labs  Lab 08/31/20 0645  AST 24  ALT 16  ALKPHOS 34*  BILITOT 0.9  PROT 4.4*  ALBUMIN 2.0*   No results for input(s): LIPASE, AMYLASE in the last 168 hours.  No results for input(s): AMMONIA in the last 168 hours. Coagulation Profile: No results for input(s): INR, PROTIME in the last 168 hours. Cardiac Enzymes: No results for input(s): CKTOTAL, CKMB, CKMBINDEX, TROPONINI in the last 168 hours. BNP (last 3 results) No results for input(s): PROBNP in the last 8760 hours. HbA1C: No results for input(s): HGBA1C in the last 72 hours. CBG: Recent Labs  Lab 09/05/20 0803 09/05/20 1230 09/05/20 1628 09/06/20 0015 09/06/20 0534  GLUCAP 193* 236* 259* 190* 189*   Lipid Profile: Recent Labs    09/04/20 0915 09/04/20 1240  TRIG SPECIMEN CONTAMINATED, UNABLE TO PERFORM TEST(S). 99   Thyroid Function Tests: No results for  input(s): TSH, T4TOTAL, FREET4, T3FREE, THYROIDAB in the last 72 hours. Anemia Panel: No results for input(s): VITAMINB12, FOLATE, FERRITIN, TIBC, IRON, RETICCTPCT in the last 72 hours.  Sepsis Labs: No results for input(s): PROCALCITON, LATICACIDVEN in the last 168 hours.   Recent Results (from the past 240 hour(s))  SARS CORONAVIRUS 2 (TAT 6-24 HRS) Nasopharyngeal Nasopharyngeal Swab     Status: Abnormal   Collection Time: 09/01/20  7:53 AM   Specimen: Nasopharyngeal Swab  Result Value Ref Range Status   SARS Coronavirus 2 POSITIVE (A) NEGATIVE Final    Comment: (NOTE) SARS-CoV-2 target nucleic acids are DETECTED.  The SARS-CoV-2 RNA is generally detectable in upper and lower respiratory specimens during the acute phase of infection. Positive results are indicative of the presence of SARS-CoV-2 RNA. Clinical correlation with patient history and other diagnostic information is  necessary to determine  patient infection status. Positive results do not rule out bacterial infection or co-infection with other viruses.  The expected result is Negative.  Fact Sheet for Patients: HairSlick.no  Fact Sheet for Healthcare Providers: quierodirigir.com  This test is not yet approved or cleared by the [place] FDA and  has been authorized for detection and/or diagnosis of SARS-CoV-2 by FDA under an Emergency Use Authorization (EUA). This EUA will remain  in effect (meaning this test can be used) for the duration of the COVID-19 declaration under Section 564(b)(1) of the Act, 21 U. S.C. section 360bbb-3(b)(1), unless the authorization is terminated or revoked sooner.   Performed at [facility], [address] [place], [place] [address]       Radiology Studies: No results found.  Scheduled Meds:  acetaminophen  650 mg Oral Q6H   amLODipine  10 mg Oral Daily   Chlorhexidine Gluconate Cloth  6 each Topical Daily   enoxaparin (LOVENOX) injection  40 mg Subcutaneous Q24H   feeding supplement  237 mL Oral BID BM   insulin aspart  0-15 Units Subcutaneous TID WC   insulin aspart  0-5 Units Subcutaneous QHS   methocarbamol  1,000 mg Oral TID   methylPREDNISolone (SOLU-MEDROL) injection  40 mg Intravenous Daily   pantoprazole (PROTONIX) IV  40 mg Intravenous Q24H   sodium chloride flush  10-40 mL Intracatheter Q12H   thiamine injection  100 mg Intravenous Daily   Continuous Infusions:  ertapenem 1,000 mg (09/05/20 0839)   promethazine (PHENERGAN) injection (IM or IVPB)     TPN ADULT (ION) 70 mL/hr at 09/05/20 1747   TPN ADULT (ION)       LOS: 14 days   Total time spent: 25 minutes  Dorcas Carrow, MD [facility] If 7PM-7AM, please contact night-coverage 09/06/2020, 11:45 AM

## 2020-09-07 ENCOUNTER — Inpatient Hospital Stay (HOSPITAL_COMMUNITY): Payer: Medicare PPO

## 2020-09-07 DIAGNOSIS — R609 Edema, unspecified: Secondary | ICD-10-CM

## 2020-09-07 LAB — COMPREHENSIVE METABOLIC PANEL
ALT: 27 U/L (ref 0–44)
AST: 21 U/L (ref 15–41)
Albumin: 1.9 g/dL — ABNORMAL LOW (ref 3.5–5.0)
Alkaline Phosphatase: 52 U/L (ref 38–126)
Anion gap: 4 — ABNORMAL LOW (ref 5–15)
BUN: 19 mg/dL (ref 8–23)
CO2: 28 mmol/L (ref 22–32)
Calcium: 8.4 mg/dL — ABNORMAL LOW (ref 8.9–10.3)
Chloride: 101 mmol/L (ref 98–111)
Creatinine, Ser: 0.55 mg/dL (ref 0.44–1.00)
GFR, Estimated: >60 mL/min (ref >60)
Glucose, Bld: 225 mg/dL — ABNORMAL HIGH (ref 70–99)
Potassium: 4.3 mmol/L (ref 3.5–5.1)
Sodium: 133 mmol/L — ABNORMAL LOW (ref 135–145)
Total Bilirubin: 0.4 mg/dL (ref 0.3–1.2)
Total Protein: 4.7 g/dL — ABNORMAL LOW (ref 6.5–8.1)

## 2020-09-07 LAB — GLUCOSE, CAPILLARY
Glucose-Capillary: 162 mg/dL — ABNORMAL HIGH (ref 70–99)
Glucose-Capillary: 277 mg/dL — ABNORMAL HIGH (ref 70–99)
Glucose-Capillary: 308 mg/dL — ABNORMAL HIGH (ref 70–99)
Glucose-Capillary: 205 mg/dL — ABNORMAL HIGH (ref 70–99)

## 2020-09-07 LAB — PHOSPHORUS: Phosphorus: 4.2 mg/dL (ref 2.5–4.6)

## 2020-09-07 LAB — CBC
HCT: 23.7 % — ABNORMAL LOW (ref 36.0–46.0)
Hemoglobin: 8 g/dL — ABNORMAL LOW (ref 12.0–15.0)
MCH: 32.9 pg (ref 26.0–34.0)
MCHC: 33.8 g/dL (ref 30.0–36.0)
MCV: 97.5 fL (ref 80.0–100.0)
Platelets: 373 10*3/uL (ref 150–400)
RBC: 2.43 MIL/uL — ABNORMAL LOW (ref 3.87–5.11)
RDW: 14.1 % (ref 11.5–15.5)
WBC: 18.9 10*3/uL — ABNORMAL HIGH (ref 4.0–10.5)
nRBC: 0 % (ref 0.0–0.2)

## 2020-09-07 LAB — MAGNESIUM: Magnesium: 1.7 mg/dL (ref 1.7–2.4)

## 2020-09-07 LAB — D-DIMER, QUANTITATIVE: D-Dimer, Quant: 7.94 ug/mL-FEU — ABNORMAL HIGH (ref 0.00–0.50)

## 2020-09-07 MED ORDER — PANTOPRAZOLE SODIUM 40 MG PO TBEC
40.0000 mg | DELAYED_RELEASE_TABLET | Freq: Every day | ORAL | Status: DC
  Administered 2020-09-07: 40 mg via ORAL
  Filled 2020-09-07: qty 1

## 2020-09-07 MED ORDER — BOOST / RESOURCE BREEZE PO LIQD CUSTOM
1.0000 | Freq: Three times a day (TID) | ORAL | Status: DC
  Administered 2020-09-07 – 2020-09-08 (×4): 1 via ORAL

## 2020-09-07 MED ORDER — THIAMINE HCL 100 MG PO TABS
100.0000 mg | ORAL_TABLET | Freq: Every day | ORAL | Status: DC
  Administered 2020-09-08: 100 mg via ORAL
  Filled 2020-09-07: qty 1

## 2020-09-07 MED ORDER — PREDNISONE 20 MG PO TABS
20.0000 mg | ORAL_TABLET | Freq: Every day | ORAL | Status: DC
  Administered 2020-09-08: 20 mg via ORAL
  Filled 2020-09-07: qty 1

## 2020-09-07 MED ORDER — POLYETHYLENE GLYCOL 3350 17 G PO PACK
17.0000 g | PACK | Freq: Every day | ORAL | Status: DC
  Administered 2020-09-07 – 2020-09-08 (×2): 17 g via ORAL
  Filled 2020-09-07: qty 1

## 2020-09-07 MED ORDER — MAGNESIUM SULFATE 2 GM/50ML IV SOLN
2.0000 g | Freq: Once | INTRAVENOUS | Status: AC
  Administered 2020-09-07: 2 g via INTRAVENOUS
  Filled 2020-09-07: qty 50

## 2020-09-07 NOTE — Progress Notes (Signed)
PHARMACY - TOTAL PARENTERAL NUTRITION CONSULT NOTE   Indication: Prolonged ileus  Patient Measurements: Height: 5\' 6"  (167.6 cm) Weight: 72.8 kg (160 lb 7.9 oz) IBW/kg (Calculated) : 59.3 TPN AdjBW (KG): 56.2 Body mass index is 25.9 kg/m. Usual Weight: ~60kg  Assessment: 89 YOF presenting with sigmoid diverticulitis, with perforation and abscess.  Now s/p Hartman's with colostomy, post-op ileus and consulted for initiation of TPN.  **Covid 19 + 8/19**  Glucose / Insulin: A1c 5 (03/2020),  BG 162-291 on steroids, received 16 units of insulin   Electrolytes:  Na 133 down, Mg 1.7, Phos 4.2 up, CoCa 10.08 Renal:  Scr 0.55, BUN 19 Hepatic: LFTs wnl, TG 99  Intake / Output; MIVF: UOP 0.53ml/kg/hr, Drain 0 mL GI Imaging: 8/10 CT abd: Sigmoid diverticulitis acute 8/12 CT abd: Developing abscess, possible rupture 8/20 CT PE: no PE, multi-focal PNA vs COVID  GI Surgeries / Procedures:  8/14: IR drain attempt 8/16 Hartman's procedure w/ colostomy  Central access: PICC 8/17, Midline 8/15 TPN start date: 8/17  Nutritional Goals: RD Assessment:  Estimated Needs Total Energy Estimated Needs: 1600-1800 Total Protein Estimated Needs: 80-90 grams Total Fluid Estimated Needs: >1.6L/d  Goal rate 70 ml/hr to provide 84 gm protein, 252 gm CHO, 50 gm lipid which provides 1697 Kcal  Current Nutrition:  8/25 Intake: 9/25 toast, 1 piece of bacon, fruit cup, bites of chicken (because doesn't eat much meat), potatoes, peas, some broccoli, peach cobbler, chocolate chip cookie. Would have drank [place] but was not given to her (doesn't like chocolate flavor)  Plan:  - Stop TPN after today's bag - taper at half rate for last hour  - Continue mealtime SSI, wean steroids as able - Give Mag IV 2g x1   [employer], PharmD, BCPS, BCCP Clinical Pharmacist  Please check AMION for all [facility] phone numbers After 10:00 PM, call Main Pharmacy [phone]

## 2020-09-07 NOTE — Progress Notes (Signed)
Bilateral lower extremity venous duplex has been completed. Preliminary results can be found in CV Proc through chart review.   09/07/20 12:53 PM Olen Cordial RVT

## 2020-09-07 NOTE — Discharge Instructions (Signed)
Wet to Dry WOUND CARE: - Change dressing twice daily - Supplies: sterile saline, kerlex, scissors, ABD pads, tape  Remove dressing and all packing carefully, moistening with sterile saline as needed to avoid packing/internal dressing sticking to the wound. 2.   Clean edges of skin around the wound with water/gauze, making sure there is no tape debris or leakage left on skin that could cause skin irritation or breakdown. 3.   Dampen and clean kerlex with sterile saline and pack wound from wound base to skin level, making sure to take note of any possible areas of wound tracking, tunneling and packing appropriately. Wound can be packed loosely. Trim kerlex to size if a whole kerlex is not required. 4.   Cover wound with a dry ABD pad and secure with tape.  5.   Write the date/time on the dry dressing/tape to better track when the last dressing change occurred. - apply any skin protectant/powder if recommended by clinician to protect skin/skin folds. - change dressing as needed if leakage occurs, wound gets contaminated, or patient requests to shower. - You may shower daily with wound open and following the shower the wound should be dried and a clean dressing placed.  - Medical grade tape as well as packing supplies can be found at [facility] on [address] or [facility] on [address]. The remaining supplies can be found at your local drug store, [employer] etc.  [facility]      [facility] [phone]  OPEN ABDOMINAL SURGERY: POST OP INSTRUCTIONS  Always review your discharge instruction sheet given to you by the facility where your surgery was performed.  IF YOU HAVE DISABILITY OR FAMILY LEAVE FORMS, YOU MUST BRING THEM TO THE OFFICE FOR PROCESSING.  PLEASE DO NOT GIVE THEM TO YOUR DOCTOR.  A prescription for pain medication may be given to you upon discharge.  Take your pain medication as prescribed, if needed.  If narcotic pain medicine is not needed,  then you may take acetaminophen (Tylenol) or ibuprofen (Advil) as needed. Take your usually prescribed medications unless otherwise directed. If you need a refill on your pain medication, please contact your pharmacy. They will contact our office to request authorization.  Prescriptions will not be filled after 5pm or on week-ends. You should follow a light diet the first few days after arrival home, such as soup and crackers, pudding, etc.unless your doctor has advised otherwise. A high-fiber, low fat diet can be resumed as tolerated.   Be sure to include lots of fluids daily. Most patients will experience some swelling and bruising on the chest and neck area.  Ice packs will help.  Swelling and bruising can take several days to resolve Most patients will experience some swelling and bruising in the area of the incision. Ice pack will help. Swelling and bruising can take several days to resolve..  It is common to experience some constipation if taking pain medication after surgery.  Increasing fluid intake and taking a stool softener will usually help or prevent this problem from occurring.  A mild laxative (Milk of Magnesia or Miralax) should be taken according to package directions if there are no bowel movements after 48 hours.  You may have steri-strips (small skin tapes) in place directly over the incision.  These strips should be left on the skin for 7-10 days.  If your surgeon used skin glue on the incision, you may shower in 24 hours.  The glue will flake off over the   next 2-3 weeks.  Any sutures or staples will be removed at the office during your follow-up visit. You may find that a light gauze bandage over your incision may keep your staples from being rubbed or pulled. You may shower and replace the bandage daily. ACTIVITIES:  You may resume regular (light) daily activities beginning the next day--such as daily self-care, walking, climbing stairs--gradually increasing activities as tolerated.   You may have sexual intercourse when it is comfortable.  Refrain from any heavy lifting or straining until approved by your doctor. You may drive when you no longer are taking prescription pain medication, you can comfortably wear a seatbelt, and you can safely maneuver your car and apply brakes Return to Work: ___________________________________ You should see your doctor in the office for a follow-up appointment approximately two weeks after your surgery.  Make sure that you call for this appointment within a day or two after you arrive home to insure a convenient appointment time. OTHER INSTRUCTIONS:  _____________________________________________________________ _____________________________________________________________  WHEN TO CALL YOUR DOCTOR: Fever over 101.0 Inability to urinate Nausea and/or vomiting Extreme swelling or bruising Continued bleeding from incision. Increased pain, redness, or drainage from the incision. Difficulty swallowing or breathing Muscle cramping or spasms. Numbness or tingling in hands or feet or around lips.  The clinic staff is available to answer your questions during regular business hours.  Please don't hesitate to call and ask to speak to one of the nurses if you have concerns.  For further questions, please visit [email or web address]   

## 2020-09-07 NOTE — Progress Notes (Signed)
Physical Therapy Treatment Patient Details Name: Anne Aguirre MRN: [id number] DOB: 1950-01-24 Today's Date: 09/07/2020    History of Present Illness 70 y.o. female presented 08/23/20 to the emergency department for evaluation of abdominal pain, nausea, and vomiting. 8/12 CT abd +abscess in pelvis; 8/14 Attempt at CT guided aspiration/drainage of pelvic abscess; 8/16 exp lap with colostomy due to perforated diverticulitis PMH-HTN    PT Comments    Pt in good spirits and on RA upon PT arrival to room. Pt ambulatory x2 room distances, with seated rest breaks as needed to recovery tachycardia and tachypnea. Pt overall requiring close guard only today, no physical assist needed for repeated sit to stands. SpO2 maintained 89% and greater on RA throughout. PT encouraged pt to keep Les elevated given swelling, and encouraged AROM LE exercises. Pt and husband at bedside in agreement. Will continue to follow.    SpO2 89-97% on RA during mobility, x1 period registering 82% but inaccurate waveform. HRmax 145 bpm, RRmax 37 breaths/min     Follow Up Recommendations  Home health PT;Supervision for mobility/OOB     Equipment Recommendations  Rolling walker with 5" wheels    Recommendations for Other Services       Precautions / Restrictions Precautions Precautions: Fall Precaution Comments: colostomy, R lower abdomen JP Restrictions Weight Bearing Restrictions: No    Mobility  Bed Mobility Overal bed mobility: Needs Assistance Bed Mobility: Rolling;Sidelying to Sit Rolling: Supervision Sidelying to sit: Supervision;HOB elevated       General bed mobility comments: for safety, HOB elevated. Cues for log roll.    Transfers Overall transfer level: Needs assistance Equipment used: Rolling walker (2 wheeled) Transfers: Sit to/from Stand Sit to Stand: Supervision         General transfer comment: for safety, cues for hand placement when rising/sitting. STS x2, from EOB  x2.  Ambulation/Gait Ambulation/Gait assistance: Min guard Gait Distance (Feet): 20 Feet (x2) Assistive device: Rolling walker (2 wheeled) Gait Pattern/deviations: Shuffle;Step-through pattern;Decreased stride length;Trunk flexed Gait velocity: dcr   General Gait Details: close guard for safety, verbal cuing for avoiding obstacles in room, upright posture, rest breaks as needed. Seated rest break x2 min to recover tachycardia and tachypnea.   Stairs             Wheelchair Mobility    Modified Rankin (Stroke Patients Only)       Balance Overall balance assessment: Needs assistance Sitting-balance support: No upper extremity supported;Feet supported Sitting balance-Leahy Scale: Fair     Standing balance support: Bilateral upper extremity supported;During functional activity Standing balance-Leahy Scale: Poor Standing balance comment: reliant on external support                            Cognition Arousal/Alertness: Awake/alert Behavior During Therapy: WFL for tasks assessed/performed Overall Cognitive Status: Within Functional Limits for tasks assessed                                        Exercises      General Comments General comments (skin integrity, edema, etc.): SpO2 89-97% on RA during mobility, x1 period registering 82% but inaccurate waveform. HRmax 145 bpm, RRmax 37 breaths/min      Pertinent Vitals/Pain Pain Assessment: Faces Faces Pain Scale: Hurts little more Pain Location: abd Pain Descriptors / Indicators: Operative site guarding;Sore Pain Intervention(s): Limited activity within patient's tolerance;Monitored  during session    Home Living                      Prior Function            PT Goals (current goals can now be found in the care plan section) Acute Rehab PT Goals Patient Stated Goal: to feel better and get stronger to return home. PT Goal Formulation: With patient Time For Goal Achievement:  09/14/20 Potential to Achieve Goals: Good Progress towards PT goals: Progressing toward goals    Frequency    Min 3X/week      PT Plan Current plan remains appropriate    Co-evaluation              AM-PAC PT "6 Clicks" Mobility   Outcome Measure  Help needed turning from your back to your side while in a flat bed without using bedrails?: A Little Help needed moving from lying on your back to sitting on the side of a flat bed without using bedrails?: A Little Help needed moving to and from a bed to a chair (including a wheelchair)?: A Little Help needed standing up from a chair using your arms (e.g., wheelchair or bedside chair)?: A Little Help needed to walk in hospital room?: A Little Help needed climbing 3-5 steps with a railing? : A Little 6 Click Score: 18    End of Session   Activity Tolerance: Patient tolerated treatment well;Patient limited by fatigue Patient left: with call bell/phone within reach;in chair;with family/visitor present;Other (comment) (pt verbalizes she will press call button and wait for assist prior to mobilizing back to bed, husband at bedside in agreement) Nurse Communication: Mobility status PT Visit Diagnosis: Unsteadiness on feet (R26.81);Pain Pain - Right/Left:  (mid) Pain - part of body:  (abd)     Time: 6812-7517 PT Time Calculation (min) (ACUTE ONLY): 18 min  Charges:  $Gait Training: 8-22 mins                    Marye Round, PT DPT Acute Rehabilitation Services Pager [phone]  Office [phone]]    Truddie Coco 09/07/2020, 4:29 PM

## 2020-09-07 NOTE — Progress Notes (Signed)
Calorie Count Note  48 hour calorie count ordered.  Diet: soft Supplements: Ensure Enlive BID, Magic Cup BID  Day 1 -- Pt ate 0% of breakfast and lunch then only 5% of dinner. No documentation provided on supplement intake. Regardless, intake over day 1 was inadequate to meet pt's nutritional needs. Discussed calorie count and meal/supplement documentation with RN. Will follow-up tomorrow with results from day 2. Noted that pt prefers Boost to Ensure, so will switch order.  Nutrition Dx: Inadequate oral intake related to acute illness (diverticulitis) as evidenced by per patient/family report, meal completion < 25%. -- ongoing  Goal: Patient will meet greater than or equal to 90% of their needs -- addressing with TPN and supplements  Intervention:  -Continue calorie count -d/c Ensure Enlive -Boost Breeze po TID, each supplement provides 250 kcal and 9 grams of protein -Continue Magic Cup BID with meals -Continue TPN, management per Pharmacy. Would not recommend discontinuing TPN until PO intake improves.   Eugene Gavia, MS, RD, LDN (she/her/hers) RD pager number and weekend/on-call pager number located in Amion.

## 2020-09-07 NOTE — TOC Progression Note (Signed)
Transition of Care [facility]) - Progression Note    Patient Details  Name: Anne Aguirre MRN: [id number] Date of Birth: Feb 21, 1950  Transition of Care [facility]) CM/SW Contact  Lorri Frederick, LCSW Phone Number: 09/07/2020, 3:43 PM  Clinical Narrative:   CSW called [employer], multiple transfers to multiple reps, finally was able to speak with someone who did a PCP search from pt zip code and emailed a list of potential PCPs.  Also able to confirm that there are notes that a case mgr is assigned to help customer locate PCP.  CSW spoke with pt husband by phone, he was in pt room, is also recovering from covid.  PT Alexa agreed to deliver the PCP list to pt--talked about [employer], pt will continue to call PCP options.      Expected Discharge Plan: Home w Home Health Services Barriers to Discharge: Continued Medical Work up  Expected Discharge Plan and Services Expected Discharge Plan: Home w Home Health Services     Post Acute Care Choice: Home Health Living arrangements for the past 2 months: Single Family Home                           HH Arranged: PT [facility] Agency: [facility] Date [facility] Agency Contacted: 08/31/20 Time HH Agency Contacted: 1402 Representative spoke with at [facility] Agency: Neysa Bonito   Social Determinants of Health (SDOH) Interventions    Readmission Risk Interventions No flowsheet data found.

## 2020-09-07 NOTE — Progress Notes (Signed)
9 Days Post-Op  Subjective: CC: Patient denies any abdominal pain this morning. She is tolerating her diet and eating 1/2 trays per reports without n/v. No output recorded from colostomy in I/O but has stool and air in bag. Doesn't like chocolate or vanilla shakes. Asking if there is a berry flavor. Working with therapies. Complains of pain in the LE's and swelling. No sob and trying to wean her o2. No CP.   Objective: Vital signs in last 24 hours: Temp:  [97.8 F (36.6 C)-98.2 F (36.8 C)] 98 F (36.7 C) (08/25 0750) Pulse Rate:  [66-90] 66 (08/25 0750) Resp:  [18] 18 (08/25 0336) BP: (126-130)/(65-74) 126/65 (08/25 0750) SpO2:  [100 %] 100 % (08/25 0750) Last BM Date: 09/07/20  Intake/Output from previous day: 08/24 0701 - 08/25 0700 In: 1925.7 [P.O.:600; I.V.:1225.7; IV Piggyback:100] Out: 1100 [Urine:1100] Intake/Output this shift: Total I/O In: 167.3 [P.O.:120; I.V.:47.3] Out: -   PE: Gen:  Alert, NAD, pleasant Pulm:  Normal rate and effort, on 2L o2 Abd: Soft, NT, midline wound is clean with small area of dehiscence at the base of the wound. No evisceration. Colostomy with air and stool in bag. Unable to see ostomy 2/2 stool. JP is SS Psych: A&Ox3  MSK: 2+ LE edema with calf tenderness Skin: no rashes noted, warm and dry  Lab Results:  Recent Labs    09/04/20 1034 09/05/20 0227  WBC 26.3* 20.6*  HGB 8.3* 9.0*  HCT 25.6* 26.7*  PLT 341 335   BMET Recent Labs    09/05/20 1104 09/07/20 0330  NA 135 133*  K 4.7 4.3  CL 101 101  CO2 25 28  GLUCOSE 239* 225*  BUN 15 19  CREATININE 0.53 0.55  CALCIUM 8.1* 8.4*   PT/INR No results for input(s): LABPROT, INR in the last 72 hours. CMP     Component Value Date/Time   NA 133 (L) 09/07/2020 0330   K 4.3 09/07/2020 0330   CL 101 09/07/2020 0330   CO2 28 09/07/2020 0330   GLUCOSE 225 (H) 09/07/2020 0330   BUN 19 09/07/2020 0330   CREATININE 0.55 09/07/2020 0330   CREATININE 1.01 (H) 04/04/2020 0000    CALCIUM 8.4 (L) 09/07/2020 0330   PROT 4.7 (L) 09/07/2020 0330   ALBUMIN 1.9 (L) 09/07/2020 0330   AST 21 09/07/2020 0330   ALT 27 09/07/2020 0330   ALKPHOS 52 09/07/2020 0330   BILITOT 0.4 09/07/2020 0330   GFRNONAA >60 09/07/2020 0330   GFRNONAA 57 (L) 04/04/2020 0000   GFRAA 66 04/04/2020 0000   Lipase     Component Value Date/Time   LIPASE 20 08/23/2020 1341    Studies/Results: DG CHEST PORT 1 VIEW  Result Date: 09/06/2020 CLINICAL DATA:  Follow-up COVID pneumonia. EXAM: PORTABLE CHEST 1 VIEW COMPARISON:  CT chest 09/02/2020; chest radiograph 08/31/2020 FINDINGS: Right upper extremity PICC tip projects at the level of the superior cavoatrial junction. Persistent small right pleural effusion with right lower lobe atelectasis. A few small locules of air project inferior to the right hemidiaphragm and likely correspond to the air noted in the probable subpulmonic fluid collection on recent CT 09/02/2020. Persistent streaky opacities in the left lower lobe, likely subsegmental atelectasis. The peripheral areas of ground-glass opacity seen on prior CT are not appreciated on today's exam. No new focal consolidation. Heart size is difficult to assess due to adjacent consolidation. Mild aortic calcifications. IMPRESSION: 1. Small right pleural effusion with associated right lower lobe atelectasis. A  few small locules of air projecting just inferior to the right hemidiaphragm are likely located in the subpulmonic fluid collection seen on CT 09/02/2020. 2. Left basilar subsegmental atelectasis, unchanged. 3. No new focal consolidation. Electronically Signed   By: Sherron Ales M.D.   On: 09/06/2020 14:39    Anti-infectives: Anti-infectives (From admission, onward)    Start     Dose/Rate Route Frequency Ordered Stop   09/03/20 1000  remdesivir 100 mg in sodium chloride 0.9 % 100 mL IVPB       See Hyperspace for full Linked Orders Report.   100 mg 200 mL/hr over 30 Minutes Intravenous Daily  09/02/20 1414 09/04/20 1043   09/02/20 1500  remdesivir 200 mg in sodium chloride 0.9% 250 mL IVPB       See Hyperspace for full Linked Orders Report.   200 mg 580 mL/hr over 30 Minutes Intravenous Once 09/02/20 1414 09/02/20 2312   08/27/20 1530  ertapenem (INVANZ) 1,000 mg in sodium chloride 0.9 % 100 mL IVPB        1 g 200 mL/hr over 30 Minutes Intravenous Every 24 hours 08/27/20 1406 09/08/20 2359   08/24/20 1145  ceFEPIme (MAXIPIME) 2 g in sodium chloride 0.9 % 100 mL IVPB  Status:  Discontinued        2 g 200 mL/hr over 30 Minutes Intravenous Every 12 hours 08/24/20 1051 08/27/20 1406   08/23/20 2100  ceFEPIme (MAXIPIME) 2 g in sodium chloride 0.9 % 100 mL IVPB  Status:  Discontinued        2 g 200 mL/hr over 30 Minutes Intravenous Every 24 hours 08/23/20 2051 08/24/20 1051   08/23/20 1700  ciprofloxacin (CIPRO) IVPB 400 mg  Status:  Discontinued        400 mg 200 mL/hr over 60 Minutes Intravenous Every 12 hours 08/23/20 1645 08/23/20 2041   08/23/20 1700  metroNIDAZOLE (FLAGYL) IVPB 500 mg  Status:  Discontinued        500 mg 100 mL/hr over 60 Minutes Intravenous Every 8 hours 08/23/20 1645 08/27/20 1406        Assessment/Plan POD#9 s/p ex lap and Hartman's procedure with colostomy by AR 8/16 for diverticulitis with abscess - JP drain with serosanguinous fluid - continue. Can likely be d/c'd before d/c  - On Invanz. Cont 10d post op abx. Stop date tomorrow  - Patient w/ robf and tol soft diet and inc PO intake. Stop tpn. Add boost breeze - Continue BID dressing changes - IS/pulm toilet - mobilize, PT rec HH    FEN: Soft, stop TNA ID: ertapenem 8/14>> VTE: scds, lovenox Foley: out 8/17, voiding   HTN AKI COVID+ - per primary team, on o2. CTA negative for PE 8/20. On remdesivir and solu-medrol. R pleural effusion LE edema - [place] to r/o DVT   LOS: 15 days    Jacinto Halim , [facility] 09/07/2020, 9:02 AM Please see Amion for pager number  during day hours 7:00am-4:30pm

## 2020-09-07 NOTE — Progress Notes (Signed)
PROGRESS NOTE   Mauricio PoLynne Claar  ZOX:[id number] DOB: 12/20/1950 DOA: 08/23/2020 PCP: Pcp, No    Brief Narrative:  70 y.o. female with past medical history of hypertension presented to the hospital with abdominal pain, nausea, vomiting and cramps. Patient was unable to tolerate any food.  In the ED, patient was noted to have a sigmoid diverticulitis with developing abscess.  Patient was then considered for admission to [facility] for further IR intervention and surgical follow-up.    During hospitalization, patient was seen by interventional radiology but there was no drainable abscess so she was treated initially conservatively with Invanz and general surgery followed-up.  Patient however persisted to have increasing abdominal pain, persistent leukocytosis and elevated lactate, so patient underwent a Hartmann procedure on  on 08/29/2020.  Postoperatively, patient has been been having some difficulty breathing and persist to have ileus.  She has been started on TPN.  She tested incidentally positive for COVID as we checked her after her husband tested positive.  Assessment & Plan:   Principal Problem:   Acute diverticulitis Active Problems:   Essential hypertension, benign   Acute renal failure superimposed on stage 3a chronic kidney disease (HCC)   Hyponatremia   Hyperbilirubinemia   Hypercalcemia  Acute sigmoid diverticulitis,  with perforation and peritonitis with severe sepsis present on admission Status post Hartmann procedure with prolonged ileus.   Currently return of bowel function, soft diet.   Surgery managing TPN.  Discontinuing today.   Magnesium replaced.   Midline incision with some dehiscence.  Surgery following.   Mobilize.   Patient is on Invanz, day 12/14 Leukocytosis is due to high-dose steroid use.  Acute hypoxemic respiratory failure, not present on admission.  Secondary to COVID-19 pneumonia. Negative test on admission 8/10 Positive test on 8/19.  Elevated  inflammatory markers including CRP of 17, D-dimer 13.  CTA of the chest 8/20 with bilateral groundglass opacities consistent with COVID-pneumonia.  Negative for pulmonary embolism.  Continue with chest physiotherapy, incentive spirometry, deep breathing exercises, sputum induction, mucolytic's and bronchodilators. Supplemental oxygen to keep saturations more than 90%. Covid directed therapy with , steroids, Solu-Medrol.  We will start tapering off.  Changed to prednisone tomorrow. remdesivir, completed 5 days of therapy.   Repeat chest x-ray with no evidence of pneumonia.  Basal atelectasis as anticipated from surgery. D-dimer was greatly elevated, CTA was negative for PE. Check lower extremity duplexes today due to swelling.  Postoperative ileus, on TPN.  Advancing to regular diet     Essential hypertension  Patient is on amlodipine, Avapro and nadolol at home.  Antihypertensives resumed.  Hypomagnesemia Replaced.  Check tomorrow morning.  Continue to mobilize.  Can discontinue cardiac telemetry and increase mobility. Patient is medically stable from COVID standpoint.  Can discharge home when surgically stable.   DVT prophylaxis: SCD's  Code Status:  Full code  Family Communication:  None.  Status is: Inpatient  Remains inpatient appropriate because:Inpatient level of care appropriate due to severity of illness, on IV antibiotics, postoperative ileus on TPN.    Dispo: The patient is from: Home              Anticipated d/c is to: Home with home health as per PT evaluation.  We will continue to evaluate during hospitalization              Patient currently is not medically stable to d/c.   Difficult to place patient No   Consultants:  General Surgery IR  Procedures:  Status post Gertie Gowda procedure with colostomy on 08/29/2020  Antimicrobials: Ertapenem 8/14---  Subjective: Patient seen and examined.  Denies any breathing problem.  Today she is on 2 L oxygen.  Able to  take deep breaths.  She is eating about half of her meals.  No other overnight events.  Passing flatus.  Denies any nausea vomiting.  Pain is controlled.   Objective:  Vitals:   09/06/20 2023 09/06/20 2125 09/07/20 0336 09/07/20 0750  BP: 127/74 127/74 130/74 126/65  Pulse: 88 79 90 66  Resp: 18 18 18    Temp: 98.2 F (36.8 C) 97.8 F (36.6 C) 97.8 F (36.6 C) 98 F (36.7 C)  TempSrc: Oral Oral Oral Oral  SpO2: 100% 100% 100% 100%  Weight:      Height:        Intake/Output Summary (Last 24 hours) at 09/07/2020 1008 Last data filed at 09/07/2020 0844 Gross per 24 hour  Intake 1912.97 ml  Output 1100 ml  Net 812.97 ml   Filed Weights   09/04/20 0549 09/05/20 0431 09/06/20 0601  Weight: 73.5 kg 72.5 kg 72.8 kg   Body mass index is 25.9 kg/m.   Physical examination: General: looks fairly comfortable. Cardiovascular: S1-S2 normal.  Regular rate rhythm.   Respiratory: Bilateral clear.  No added sounds.  Poor air entry. SpO2: 100 % O2 Flow Rate (L/min): 2 L/min  Gastrointestinal: Soft but distended.  Tenderness around the colostomy site.  There is a stool and air in the colostomy bag. Midline surgical incision with dehiscence lower incision. JP drain with some serosanguineous discharge. Ext: No cyanosis or edema. Neuro: Intact.  Data Reviewed: I have personally reviewed following labs and imaging studies  CBC: Recent Labs  Lab 09/02/20 0452 09/03/20 0409 09/04/20 1034 09/05/20 0227 09/07/20 0330  WBC 18.7* 18.4* 26.3* 20.6* 18.9*  HGB 9.3* 8.0* 8.3* 9.0* 8.0*  HCT 26.9* 23.3* 25.6* 26.7* 23.7*  MCV 93.7 95.1 103.6* 96.7 97.5  PLT 187 184 341 335 373   Basic Metabolic Panel: Recent Labs  Lab 09/01/20 0421 09/02/20 0452 09/03/20 0409 09/05/20 1104 09/07/20 0330  NA 133* 131* 134* 135 133*  K 4.1 3.7 4.2 4.7 4.3  CL 106 103 102 101 101  CO2 20* 22 25 25 28   GLUCOSE 150* 141* 220* 239* 225*  BUN 11 9 10 15 19   CREATININE 0.58 0.51 0.47 0.53 0.55   CALCIUM 7.3* 7.2* 7.5* 8.1* 8.4*  MG 1.3* 1.4* 1.7 1.5* 1.7  PHOS 2.3* 2.3* 2.7 3.3 4.2   GFR: Estimated Creatinine Clearance: 68.7 mL/min (by C-G formula based on SCr of 0.55 mg/dL). Liver Function Tests: Recent Labs  Lab 09/07/20 0330  AST 21  ALT 27  ALKPHOS 52  BILITOT 0.4  PROT 4.7*  ALBUMIN 1.9*   No results for input(s): LIPASE, AMYLASE in the last 168 hours.  No results for input(s): AMMONIA in the last 168 hours. Coagulation Profile: No results for input(s): INR, PROTIME in the last 168 hours. Cardiac Enzymes: No results for input(s): CKTOTAL, CKMB, CKMBINDEX, TROPONINI in the last 168 hours. BNP (last 3 results) No results for input(s): PROBNP in the last 8760 hours. HbA1C: No results for input(s): HGBA1C in the last 72 hours. CBG: Recent Labs  Lab 09/06/20 0534 09/06/20 1146 09/06/20 1600 09/06/20 2123 09/07/20 0747  GLUCAP 189* 219* 243* 291* 162*   Lipid Profile: Recent Labs    09/04/20 1240  TRIG 99   Thyroid Function Tests: No results for input(s): TSH, T4TOTAL,  FREET4, T3FREE, THYROIDAB in the last 72 hours. Anemia Panel: No results for input(s): VITAMINB12, FOLATE, FERRITIN, TIBC, IRON, RETICCTPCT in the last 72 hours.  Sepsis Labs: No results for input(s): PROCALCITON, LATICACIDVEN in the last 168 hours.   Recent Results (from the past 240 hour(s))  SARS CORONAVIRUS 2 (TAT 6-24 HRS) Nasopharyngeal Nasopharyngeal Swab     Status: Abnormal   Collection Time: 09/01/20  7:53 AM   Specimen: Nasopharyngeal Swab  Result Value Ref Range Status   SARS Coronavirus 2 POSITIVE (A) NEGATIVE Final    Comment: (NOTE) SARS-CoV-2 target nucleic acids are DETECTED.  The SARS-CoV-2 RNA is generally detectable in upper and lower respiratory specimens during the acute phase of infection. Positive results are indicative of the presence of SARS-CoV-2 RNA. Clinical correlation with patient history and other diagnostic information is  necessary to  determine patient infection status. Positive results do not rule out bacterial infection or co-infection with other viruses.  The expected result is Negative.  Fact Sheet for Patients: HairSlick.no  Fact Sheet for Healthcare Providers: quierodirigir.com  This test is not yet approved or cleared by the [place] FDA and  has been authorized for detection and/or diagnosis of SARS-CoV-2 by FDA under an Emergency Use Authorization (EUA). This EUA will remain  in effect (meaning this test can be used) for the duration of the COVID-19 declaration under Section 564(b)(1) of the Act, 21 U. S.C. section 360bbb-3(b)(1), unless the authorization is terminated or revoked sooner.   Performed at [facility], [address], [place], [place] [address]       Radiology Studies: DG CHEST PORT 1 VIEW  Result Date: 09/06/2020 CLINICAL DATA:  Follow-up COVID pneumonia. EXAM: PORTABLE CHEST 1 VIEW COMPARISON:  CT chest 09/02/2020; chest radiograph 08/31/2020 FINDINGS: Right upper extremity PICC tip projects at the level of the superior cavoatrial junction. Persistent small right pleural effusion with right lower lobe atelectasis. A few small locules of air project inferior to the right hemidiaphragm and likely correspond to the air noted in the probable subpulmonic fluid collection on recent CT 09/02/2020. Persistent streaky opacities in the left lower lobe, likely subsegmental atelectasis. The peripheral areas of ground-glass opacity seen on prior CT are not appreciated on today's exam. No new focal consolidation. Heart size is difficult to assess due to adjacent consolidation. Mild aortic calcifications. IMPRESSION: 1. Small right pleural effusion with associated right lower lobe atelectasis. A few small locules of air projecting just inferior to the right hemidiaphragm are likely located in the subpulmonic fluid collection seen on CT  09/02/2020. 2. Left basilar subsegmental atelectasis, unchanged. 3. No new focal consolidation. Electronically Signed   By: Sherron Ales M.D.   On: 09/06/2020 14:39    Scheduled Meds:  acetaminophen  650 mg Oral Q6H   amLODipine  10 mg Oral Daily   Chlorhexidine Gluconate Cloth  6 each Topical Daily   enoxaparin (LOVENOX) injection  40 mg Subcutaneous Q24H   feeding supplement  1 Container Oral TID BM   feeding supplement  237 mL Oral BID BM   insulin aspart  0-15 Units Subcutaneous TID WC   insulin aspart  0-5 Units Subcutaneous QHS   methocarbamol  1,000 mg Oral TID   pantoprazole  40 mg Oral QHS   polyethylene glycol  17 g Oral Daily   [START ON 09/08/2020] predniSONE  20 mg Oral Q breakfast   sodium chloride flush  10-40 mL Intracatheter Q12H   [START ON 09/08/2020] thiamine  100  mg Oral Daily   Continuous Infusions:  ertapenem 1,000 mg (09/07/20 0817)   magnesium sulfate bolus IVPB     promethazine (PHENERGAN) injection (IM or IVPB)     TPN ADULT (ION) 50 mL/hr at 09/06/20 1751     LOS: 15 days   Total time spent: 25 minutes  Dorcas Carrow, MD [facility] If 7PM-7AM, please contact night-coverage 09/07/2020, 10:08 AM

## 2020-09-08 ENCOUNTER — Other Ambulatory Visit (HOSPITAL_COMMUNITY): Payer: Self-pay

## 2020-09-08 LAB — GLUCOSE, CAPILLARY
Glucose-Capillary: 81 mg/dL (ref 70–99)
Glucose-Capillary: 113 mg/dL — ABNORMAL HIGH (ref 70–99)

## 2020-09-08 LAB — D-DIMER, QUANTITATIVE: D-Dimer, Quant: 9.52 ug/mL-FEU — ABNORMAL HIGH (ref 0.00–0.50)

## 2020-09-08 MED ORDER — MAGNESIUM SULFATE 2 GM/50ML IV SOLN
2.0000 g | Freq: Once | INTRAVENOUS | Status: AC
  Administered 2020-09-08: 2 g via INTRAVENOUS
  Filled 2020-09-08: qty 50

## 2020-09-08 MED ORDER — OXYCODONE HCL 5 MG PO TABS
5.0000 mg | ORAL_TABLET | Freq: Four times a day (QID) | ORAL | 0 refills | Status: DC | PRN
  Filled 2020-09-08: qty 15, 4d supply, fill #0

## 2020-09-08 NOTE — TOC Transition Note (Signed)
Transition of Care [facility]) - CM/SW Discharge Note   Patient Details  Name: Angie Piercey MRN: [id number] Date of Birth: 1950-09-19  Transition of Care [facility]) CM/SW Contact:  Lorri Frederick, LCSW Phone Number: 09/08/2020, 10:57 AM   Clinical Narrative:   Pt discharging home with [facility].  [employer] provides 3n1, walker, hospital bed.  See today's other note for PCP details.  Husband to transport home.    Final next level of care: Home w Home Health Services Barriers to Discharge: Barriers Resolved   Patient Goals and CMS Choice Patient states their goals for this hospitalization and ongoing recovery are:: "get back to where I was" CMS Medicare.gov Compare Post Acute Care list provided to:: Patient Choice offered to / list presented to : Patient  Discharge Placement                  Name of family member notified: husband Brett Canales Patient and family notified of of transfer: 09/08/20  Discharge Plan and Services     Post Acute Care Choice: Home Health          DME Arranged: Hospital bed DME Agency: [employer] Date DME Agency Contacted: 09/08/20 Time DME Agency Contacted: [phone] Representative spoke with at DME Agency: Velna Hatchet HH Arranged: PT, OT, RN [facility] Agency: [facility] Date [facility] Agency Contacted: 09/08/20 Time HH Agency Contacted: [phone] Representative spoke with at [facility] Agency: Kandee Keen  Social Determinants of Health (SDOH) Interventions     Readmission Risk Interventions No flowsheet data found.

## 2020-09-08 NOTE — Progress Notes (Addendum)
Physical Therapy Treatment Patient Details Name: Anne Aguirre MRN: [id number] DOB: 12/21/1950 Today's Date: 09/08/2020    History of Present Illness 70 y.o. female presented 08/23/20 to the emergency department for evaluation of abdominal pain, nausea, and vomiting. 8/12 CT abd +abscess in pelvis; 8/14 Attempt at CT guided aspiration/drainage of pelvic abscess; 8/16 exp lap with colostomy due to perforated diverticulitis PMH-HTN    PT Comments    Pt eager to d/c home, agreeable to gait training and HEP administration prior to d/c. Pt tolerates about 30 ft of gait with RW before fatiguing. PT encouraged pt to have husband place a straight back chair on sidewalk about midpoint distance for seated rest break as needed when entering home today, in case pt fatigues. PT also encouraged pt to have husband with her whenever mobilizing to ensure pt safety and steadiness. Lastly, pt encouraged pt to purchase a pulse oximeter for home use to monitor SpO2 and HR, pt agreeable. HEP reviewed and administered, pt with no further questions.      Follow Up Recommendations  Home health PT;Supervision for mobility/OOB     Equipment Recommendations  Rolling walker with 5" wheels    Recommendations for Other Services       Precautions / Restrictions Precautions Precautions: Fall Precaution Comments: colostomy, R lower abdomen JP Restrictions Weight Bearing Restrictions: No    Mobility  Bed Mobility Overal bed mobility: Modified Independent             General bed mobility comments: Mod I for in/out of bed for HOB elevation and increased time.    Transfers Overall transfer level: Needs assistance Equipment used: Rolling walker (2 wheeled) Transfers: Sit to/from Stand Sit to Stand: Supervision         General transfer comment: for safety, no cues for hand placement required. STS x2, from EOB x2.  Ambulation/Gait Ambulation/Gait assistance: Supervision Gait Distance (Feet): 30  Feet Assistive device: Rolling walker (2 wheeled) Gait Pattern/deviations: Shuffle;Step-through pattern;Decreased stride length;Trunk flexed Gait velocity: decr   General Gait Details: supervision for safety, periods of close guard. PT cuing for upright posture, room navigation.   Stairs             Wheelchair Mobility    Modified Rankin (Stroke Patients Only)       Balance Overall balance assessment: Needs assistance Sitting-balance support: No upper extremity supported;Feet supported Sitting balance-Leahy Scale: Fair     Standing balance support: Bilateral upper extremity supported;During functional activity Standing balance-Leahy Scale: Poor Standing balance comment: reliant on external support                            Cognition Arousal/Alertness: Awake/alert Behavior During Therapy: WFL for tasks assessed/performed Overall Cognitive Status: Within Functional Limits for tasks assessed                                        Exercises General Exercises - Lower Extremity Hip ABduction/ADduction: AROM;Both;5 reps;Standing Other Exercises Other Exercises: HEP: sit to stands x10, 2x/day; LAQs x20, 2x/day; standing hip abd x10 with UE support bilat, 2x/day; standing knee flexion with UE support bilat x10, 2x/day. HEP reviewed, STS and standing hip abduction practiced, PT encouraged pt to self-regulate number of reps based on fatigue. Handout made on medbridge and administered to pt    General Comments  Pertinent Vitals/Pain Pain Assessment: Faces Faces Pain Scale: Hurts little more Pain Location: abd Pain Descriptors / Indicators: Operative site guarding;Sore Pain Intervention(s): Limited activity within patient's tolerance;Monitored during session;Repositioned    Home Living                      Prior Function            PT Goals (current goals can now be found in the care plan section) Acute Rehab PT  Goals Patient Stated Goal: to feel better and get stronger to return home. PT Goal Formulation: With patient Time For Goal Achievement: 09/14/20 Potential to Achieve Goals: Good Progress towards PT goals: Progressing toward goals    Frequency    Min 3X/week      PT Plan Current plan remains appropriate    Co-evaluation              AM-PAC PT "6 Clicks" Mobility   Outcome Measure  Help needed turning from your back to your side while in a flat bed without using bedrails?: A Little Help needed moving from lying on your back to sitting on the side of a flat bed without using bedrails?: A Little Help needed moving to and from a bed to a chair (including a wheelchair)?: A Little Help needed standing up from a chair using your arms (e.g., wheelchair or bedside chair)?: A Little Help needed to walk in hospital room?: A Little Help needed climbing 3-5 steps with a railing? : A Little 6 Click Score: 18    End of Session   Activity Tolerance: Patient tolerated treatment well;Patient limited by fatigue Patient left: with call bell/phone within reach;in bed;with bed alarm set Nurse Communication: Mobility status PT Visit Diagnosis: Unsteadiness on feet (R26.81);Pain Pain - Right/Left:  (mid) Pain - part of body:  (abd)     Time: 6712-4580 PT Time Calculation (min) (ACUTE ONLY): 17 min  Charges:  $Gait Training: 8-22 mins                     Marye Round, PT DPT Acute Rehabilitation Services Pager [phone]  Office [phone]    Anne Aguirre 09/08/2020, 2:58 PM

## 2020-09-08 NOTE — TOC Progression Note (Addendum)
Transition of Care [facility]) - Progression Note    Patient Details  Name: Anne Aguirre MRN: [id number] Date of Birth: 02/18/1950  Transition of Care [facility]) CM/SW Contact  Lorri Frederick, LCSW Phone Number: 09/08/2020, 10:53 AM  Clinical Narrative:   CSW spoke with husband, he has an appt for PCP with Dr Tommi Rumps [place] at [facility] clinic on 10/5.  Also working on one other potential option in [place].    CSW spoke with [facility] office, they cannot do hospital discharge appt prior to the 10/5 appt.  CSW messaged Dr Judithann Sheen who will provide bridge HH orders in the interim.  Cory at [facility] accepts referral and is aware of the PCP situation.    Spoke with husband again, confirmed the above.  He is requesting hospital bed, which was ordered.    1144: Phone call from husband,  they have changed their mind, do not want hospital bed.  [facility] informed.     Expected Discharge Plan: Home w Home Health Services Barriers to Discharge: Continued Medical Work up  Expected Discharge Plan and Services Expected Discharge Plan: Home w Home Health Services     Post Acute Care Choice: Home Health Living arrangements for the past 2 months: Single Family Home                 DME Arranged: 3-N-1, Walker rolling DME Agency: [employer] Date DME Agency Contacted: 09/08/20 Time DME Agency Contacted: [phone] Representative spoke with at DME Agency: Velna Hatchet HH Arranged: PT, OT, RN [facility] Agency: [facility] Date [facility] Agency Contacted: 09/08/20 Time HH Agency Contacted: [phone] Representative spoke with at [facility] Agency: Kandee Keen   Social Determinants of Health (SDOH) Interventions    Readmission Risk Interventions No flowsheet data found.

## 2020-09-08 NOTE — Discharge Summary (Signed)
Physician Discharge Summary  Natassia Guthridge GNF:[id number] DOB: December 07, 1950 DOA: 08/23/2020  PCP: Pcp, No  Admit date: 08/23/2020 Discharge date: 09/08/2020  Admitted From: Home  Disposition: Home with home health  Recommendations for Outpatient Follow-up:  Follow up with PCP in 1-2 weeks as scheduled Please obtain BMP/CBC/magnesium/phosphorus in one week Follow-up with surgery as a scheduled  Home Health: PT/OT/RN Equipment/Devices: Hospital bed, 3 and 1 and walker  Discharge Condition: Stable CODE STATUS: Full code Diet recommendation: Low-salt regular diet  Discharge summary: 70 y.o. female with past medical history of hypertension presented to the hospital with abdominal pain, nausea, vomiting and cramps.  Patient was found to have sigmoid diverticulitis with local abscess.  Initially treated conservatively, persistent abdominal pain and unresolved infection, underwent Hartmann procedure on 8/16.  Postoperatively developed some breathing difficulties and diagnosed with COVID-19 infection that was checked because her husband contracted COVID-19.  Improved now.  Acute sigmoid diverticulitis with perforation and peritonitis, sepsis present on admission: Failed conservative treatment Status post Hartmann procedure with prolonged ileus and treated with TPN.  Adequately improved.  Now with return of bowel function. Completed antibiotics 10 days of therapy.  Off TPN. Going home with regular diet, colostomy care and surgical follow-up. Patient has midline incision with dehiscence on the inferior aspect, local wound care advised.  Acute hypoxemic respiratory failure secondary to multifactorial, COVID-19 pneumonia, atelectasis and abdominal distention from ileus: Patient was treated with COVID-19 directed therapy with steroids and remdesivir.  Aggressive bronchodilator therapy and chest physiotherapy.  Symptomatically improved.  Now on room air.  At one point she was on 5 L oxygen. Completed 7  days IV steroids, no indication to continue as her symptoms have improved. She will continue to use incentive spirometry and chest physiotherapy at home.  Essential hypertension: Resume all home medications.  Electrolytes are adequate and replaced.  Patient with significant debility and deconditioning, will benefit with home health PT OT.  She has significant new medical diagnoses including colostomy, impaired mobility and will benefit with a hospital bed.  Stable to discharge home with home health therapies today.      Discharge Diagnoses:  Principal Problem:   Acute diverticulitis Active Problems:   Essential hypertension, benign   Acute renal failure superimposed on stage 3a chronic kidney disease (HCC)   Hyponatremia   Hyperbilirubinemia   Hypercalcemia    Discharge Instructions  Discharge Instructions     Amb Referral to Ostomy Clinic   Complete by: As directed    Reason for referral modifiers: Pre and post-operative counseling for ostomy management   Call MD for:  difficulty breathing, headache or visual disturbances   Complete by: As directed    Call MD for:  persistant nausea and vomiting   Complete by: As directed    Call MD for:  severe uncontrolled pain   Complete by: As directed    Diet - low sodium heart healthy   Complete by: As directed    Discharge wound care:   Complete by: As directed    Abdominal wound  Cleanse with NS, pat gently dry. Fill defects with saline moistened roll gauze, top with dry gauze, ABD pads and secure with tape. Change PRN for soiling, otherwise twice daily.   Increase activity slowly   Complete by: As directed       Allergies as of 09/08/2020   No Known Allergies      Medication List     STOP taking these medications    Vitamin D-3 125 MCG (  5000 UT) Tabs       TAKE these medications    amLODipine 10 MG tablet Commonly known as: NORVASC Take 1 tablet (10 mg total) by mouth daily.   irbesartan 300 MG  tablet Commonly known as: AVAPRO Take 1 tablet (300 mg total) by mouth daily.   Nebivolol HCl 20 MG Tabs Take 1 tablet (20 mg total) by mouth daily at 12 noon.   oxyCODONE 5 MG immediate release tablet Commonly known as: Oxy IR/ROXICODONE Take 1 tablet (5 mg total) by mouth every 6 (six) hours as needed for breakthrough pain.               Durable Medical Equipment  (From admission, onward)           Start     Ordered   09/08/20 1050  For home use only DME Hospital bed  Once       Question Answer Comment  Length of Need Lifetime   Patient has (list medical condition): Ruptured diverticulitis , Covid 19 infection   The above medical condition requires: Patient requires the ability to reposition frequently   Head must be elevated greater than: 30 degrees   Bed type Semi-electric      09/08/20 1050   09/07/20 1516  For home use only DME 3 n 1  Once        09/07/20 1519   09/07/20 1516  For home use only DME Walker rolling  Once       Question Answer Comment  Walker: With 5 Inch Wheels   Patient needs a walker to treat with the following condition Physical deconditioning      09/07/20 1519              Discharge Care Instructions  (From admission, onward)           Start     Ordered   09/08/20 0000  Discharge wound care:       Comments: Abdominal wound  Cleanse with NS, pat gently dry. Fill defects with saline moistened roll gauze, top with dry gauze, ABD pads and secure with tape. Change PRN for soiling, otherwise twice daily.   09/08/20 1105            Follow-up Information     [facility], [facility] Follow up on 09/27/2020.   Specialty: General Surgery Why: @420pm  for follow up with Dr. . Please arrive 30 minutes prior to your appointment for paperwork. Please bring a copy of your photo ID and insurance card. Contact information: [address] [employer] [place] [phone]         Care, [facility]  Follow up.   Specialty: Home Health Services Why: [facility] will contact you to schedule your first home visit. Contact information: [address] [place] [phone]         [facility], [facility] Follow up.   Why: This company, now called [employer], will provide your hospital bed.  Please call them with any issues. Contact information: [address] [place] [phone] [place] [phone]                No Known Allergies  Consultations: General surgery   Procedures/Studies: CT ABDOMEN PELVIS WO CONTRAST  Result Date: 08/23/2020 CLINICAL DATA:  Lower abdominal pain EXAM: CT ABDOMEN AND PELVIS WITHOUT CONTRAST TECHNIQUE: Multidetector CT imaging of the abdomen and pelvis was performed following the standard protocol without IV contrast. COMPARISON:  None. FINDINGS: Lower chest: Lung bases demonstrate no acute consolidation or effusion. Partially visualized breast prostheses. Mild circumferential thickening of the distal esophagus. Hepatobiliary: Hypodense liver lesions likely cysts. Additional subcentimeter hypodensities too small to further characterize. No calcified gallstone or biliary dilatation Pancreas: Unremarkable. No pancreatic ductal dilatation or surrounding inflammatory changes. Spleen: Normal in size without focal abnormality. Adrenals/Urinary Tract: Adrenal glands are normal. Kidneys show no hydronephrosis. The bladder is slightly thick walled Stomach/Bowel: The stomach is nonenlarged. No dilated small bowel. Fluid distension of the colon. Mild wall thickening with inflammatory change at the sigmoid colon with diverticula present. Vascular/Lymphatic: Mild to moderate aortic atherosclerosis. No aneurysm. No suspicious nodes. Reproductive: Uterus and bilateral adnexa are unremarkable. Other: No free air.  Small fluid in the pelvis Musculoskeletal: No acute osseous abnormality. Degenerative changes of the spine. IMPRESSION: 1. Mild wall thickening with  associated inflammatory change at sigmoid colon with diverticula present, likely reflecting an acute diverticulitis. No definite perforation or abscess. 2. Moderate fluid distension of the colon upstream to the area of sigmoid colon inflammation. No evidence for small bowel obstruction. 3. Mild circumferential distal esophageal thickening questionable for reflux or esophagitis. Electronically Signed   By: Jasmine Pang M.D.   On: 08/23/2020 16:34   DG Chest 2 View  Result Date: 08/23/2020 CLINICAL DATA:  Nausea vomiting EXAM: CHEST - 2 VIEW COMPARISON:  None. FINDINGS: The heart size and mediastinal contours are within normal limits. Both lungs are clear. The visualized skeletal structures are unremarkable. IMPRESSION: No active cardiopulmonary disease. Electronically Signed   By: Jasmine Pang M.D.   On: 08/23/2020 16:34   CT Angio Chest Pulmonary Embolism (PE) W or WO Contrast  Result Date: 09/02/2020 CLINICAL DATA:  Hypoxia.  COVID-19 infection. EXAM: CT ANGIOGRAPHY CHEST WITH CONTRAST TECHNIQUE: Multidetector CT imaging of the chest was performed using the standard protocol during bolus administration of intravenous contrast. Multiplanar CT image reconstructions and MIPs were obtained to evaluate the vascular anatomy. CONTRAST:  53mL OMNIPAQUE IOHEXOL 350 MG/ML SOLN COMPARISON:  Chest radiograph, 08/31/2020. FINDINGS: Cardiovascular: Pulmonary arteries are satisfactorily opacified. Study mildly limited due to low lung volumes and respiratory motion. Allowing for this, there is no evidence of a pulmonary embolism. Heart is normal in size. No pericardial effusion. Three-vessel coronary artery calcifications. Great vessels are normal in caliber. Mild aortic atherosclerosis. No dissection. Mediastinum/Nodes: No enlarged mediastinal, hilar, or axillary lymph nodes. Thyroid gland, trachea, and esophagus demonstrate no significant findings. Right sided PICC has its tip just entering the right atrium.  Lungs/Pleura: Moderate right pleural effusion. Trace left pleural fluid. There is a sub pulmonic loculated component of the pleural effusion that contains non dependent air, and measures 10 x 8 x 2.2 cm. This could potentially reside underneath the hemidiaphragm. It does create a concave indentation of the underlying liver contour. Right lower lobe is collapsed. There is partial atelectasis of the left lower lobe. Patchy areas of predominantly ground-glass opacity are noted bilaterally, the most apparent in the peripheral left upper lobe. Additional mild linear atelectasis is noted at the bases of the left upper lobe lingula and right middle lobe. No pneumothorax. Upper Abdomen: Air-fluid levels noted in the stomach visualized right upper quadrant bowel. Small amount of ascites. Musculoskeletal: No fracture or acute finding.  No bone lesion. Review of the MIP images confirms the above findings. IMPRESSION: 1. No evidence of a pulmonary embolism. 2. Peripheral areas of ground-glass opacity in the lungs consistent with multifocal pneumonia, pattern also consistent with viral/COVID-19 infection. 3.  Complete right and partial left lower lobe atelectasis. 4. Moderate right and trace left pleural effusions. 5. Right base fluid collection containing nondependent air, which may be a sub pulmonic loculated fusion or subdiaphragmatic in location. If this is subdiaphragmatic, the source of the non dependent air is unclear. The non dependent air favors a subpulmonic location. 6. Coronary artery calcifications and mild aortic atherosclerosis. Aortic Atherosclerosis (ICD10-I70.0). Electronically Signed   By: Amie Portland M.D.   On: 09/02/2020 20:31   CT ABDOMEN PELVIS W CONTRAST  Result Date: 08/25/2020 CLINICAL DATA:  Abdominal distension and elevated white blood cell count EXAM: CT ABDOMEN AND PELVIS WITH CONTRAST TECHNIQUE: Multidetector CT imaging of the abdomen and pelvis was performed using the standard protocol  following bolus administration of intravenous contrast. CONTRAST:  85mL OMNIPAQUE IOHEXOL 350 MG/ML SOLN COMPARISON:  08/23/2020 FINDINGS: Lower chest: Lung bases demonstrate bibasilar consolidation with small effusions new from the prior exam. Bilateral breast implants are noted. Hepatobiliary: Scattered hypodensities are again seen throughout the liver consistent with cysts. Gallbladder demonstrates some minimal dependent density likely related to sludge. No definitive calcified stones are noted. No biliary ductal dilatation is seen. Pancreas: Unremarkable. No pancreatic ductal dilatation or surrounding inflammatory changes. Spleen: Normal in size without focal abnormality. Adrenals/Urinary Tract: Adrenal glands are within normal limits. Kidneys demonstrate a normal enhancement pattern bilaterally. Normal excretion of contrast is seen. A few small cysts are noted. Ureters are within normal limits. Bladder is partially distended. Stomach/Bowel: Colons demonstrates scattered diverticular change is well as some inflammatory change in the region of the sigmoid these changes are similar to that noted on the prior exam. Stomach is well distended with contrast material. The small bowel is prominent with multiple fluid-filled dilated loops of small bowel identified. Some peristalsis is noted within the small bowel although it appears to be dilated to the level of mid ileum without discrete transition zone. Decreased bowel sounds are noted on physical exam in these changes may represent a generalized small bowel ileus. Vascular/Lymphatic: Aortic atherosclerosis. No enlarged abdominal or pelvic lymph nodes. Reproductive: Uterus and bilateral adnexa are unremarkable. Other: There is a new air-fluid collection identified in the right hemipelvis best seen on image number 74 of series 2 this is new from the prior exam and demonstrates a small amount of air within. This measures 5.8 x 4.3 cm in greatest dimension. Given the  inflammatory changes and diverticulitis this likely represents a developing abscess from diverticular rupture. No other focal fluid collection is noted. Musculoskeletal: Degenerative changes of the lumbar spine are noted. IMPRESSION: New air-fluid collection in the right hemipelvis likely representing a developing abscess. This is most likely related to the underlying diverticulitis and possible diverticular rupture. Generalized small bowel dilatation with fluid-filled loops of small bowel. On clinical exam there are decreased bowel sounds in these changes may represent a reactive small bowel ileus. Continued follow-up is recommended. New bilateral lower lobe consolidation with associated effusions consistent with atelectasis/early infiltrate. Electronically Signed   By: Alcide Clever M.D.   On: 08/25/2020 17:12   DG CHEST PORT 1 VIEW  Result Date: 09/06/2020 CLINICAL DATA:  Follow-up COVID pneumonia. EXAM: PORTABLE CHEST 1 VIEW COMPARISON:  CT chest 09/02/2020; chest radiograph 08/31/2020 FINDINGS: Right upper extremity PICC tip projects at the level of the superior cavoatrial junction. Persistent small right pleural effusion with right lower lobe atelectasis. A few small locules of air project inferior to the right hemidiaphragm and likely correspond to the air noted in the probable subpulmonic  fluid collection on recent CT 09/02/2020. Persistent streaky opacities in the left lower lobe, likely subsegmental atelectasis. The peripheral areas of ground-glass opacity seen on prior CT are not appreciated on today's exam. No new focal consolidation. Heart size is difficult to assess due to adjacent consolidation. Mild aortic calcifications. IMPRESSION: 1. Small right pleural effusion with associated right lower lobe atelectasis. A few small locules of air projecting just inferior to the right hemidiaphragm are likely located in the subpulmonic fluid collection seen on CT 09/02/2020. 2. Left basilar subsegmental  atelectasis, unchanged. 3. No new focal consolidation. Electronically Signed   By: Sherron Ales M.D.   On: 09/06/2020 14:39   DG CHEST PORT 1 VIEW  Result Date: 08/31/2020 CLINICAL DATA:  Shortness of breath EXAM: PORTABLE CHEST 1 VIEW COMPARISON:  Chest x-ray dated August 23, 2020 FINDINGS: Right arm PICC with tip positioned over the expected area of the upper right atrium. Low lung volumes with bibasilar opacities, likely combination of atelectasis and pleural effusions. No evidence of pneumothorax. Visualized cardiac and mediastinal contours are within normal limits. Lucency overlying the left upper quadrant, likely postoperative pneumoperitoneum. IMPRESSION: Right arm PICC with tip positioned over the expected area of the upper right atrium. Low lung volumes with bibasilar opacities, likely combination of atelectasis and pleural effusions. Electronically Signed   By: Allegra Lai M.D.   On: 08/31/2020 13:45   CT IMAGE GUIDED DRAINAGE BY PERCUTANEOUS CATHETER  Result Date: 08/27/2020 INDICATION: 70 year old female referred for possible CT-guided drainage of pelvic fluid, in the setting of acute diverticulitis. EXAM: CT GUIDED DRAINAGE OF PELVIC   ABSCESS DISCONTINUATION OF PROCEDURE MEDICATIONS: None ANESTHESIA/SEDATION: None COMPLICATIONS: None TECHNIQUE: Informed written consent was obtained from the patient after a thorough discussion of the procedural risks, benefits and alternatives. All questions were addressed. Maximal Sterile Barrier Technique was utilized including caps, mask, sterile gowns, sterile gloves, sterile drape, hand hygiene and skin antiseptic. A timeout was performed prior to the initiation of the procedure. PROCEDURE: Patient was position prone position on the CT gantry table for scout CT. Scout CT was performed demonstrating there was a decreasing size of the previous pelvic fluid collection, potentially read distributed given the scant fluid within the dependent pelvis adjacent  to the urinary bladder. No target for aspiration/drainage. FINDINGS: No target for attempted drainage. IMPRESSION: Prone position CT for attempt at a transgluteal aspiration/drainage reveals that the fluid collection is improved/read distributed, with no target for safe percutaneous access. Drainage was deferred. Electronically Signed   By: Gilmer Mor D.O.   On: 08/27/2020 12:22   VAS [place] LOWER EXTREMITY VENOUS (DVT)  Result Date: 09/07/2020  Lower Venous DVT Study Patient Name:  BRITNAY MAGNUSSEN  Date of Exam:   09/07/2020 Medical Rec #: [id number]    Accession #:    [id number] Date of Birth: 1950-03-12     Patient Gender: F Patient Age:   56 years Exam Location:  [facility] Procedure:      VAS [place] LOWER EXTREMITY VENOUS (DVT) Referring Phys: MICHAEL MACZIS --------------------------------------------------------------------------------  Indications: Edema.  Risk Factors: COVID 19 positive. Limitations: Poor ultrasound/tissue interface. Comparison Study: No prior studies. Performing Technologist: Chanda Busing RVT  Examination Guidelines: A complete evaluation includes B-mode imaging, spectral Doppler, color Doppler, and power Doppler as needed of all accessible portions of each vessel. Bilateral testing is considered an integral part of a complete examination. Limited examinations for reoccurring indications may be performed as noted. The reflux portion of the exam is performed with the  patient in reverse Trendelenburg.  +---------+---------------+---------+-----------+----------+--------------+ RIGHT    CompressibilityPhasicitySpontaneityPropertiesThrombus Aging +---------+---------------+---------+-----------+----------+--------------+ CFV      Full           Yes      Yes                                 +---------+---------------+---------+-----------+----------+--------------+ SFJ      Full                                                         +---------+---------------+---------+-----------+----------+--------------+ FV Prox  Full                                                        +---------+---------------+---------+-----------+----------+--------------+ FV Mid   Full           Yes      Yes                                 +---------+---------------+---------+-----------+----------+--------------+ FV DistalFull                                                        +---------+---------------+---------+-----------+----------+--------------+ PFV      Full                                                        +---------+---------------+---------+-----------+----------+--------------+ POP      Full           Yes      Yes                                 +---------+---------------+---------+-----------+----------+--------------+ PTV      Full                                                        +---------+---------------+---------+-----------+----------+--------------+ PERO     Full                                                        +---------+---------------+---------+-----------+----------+--------------+   +---------+---------------+---------+-----------+----------+--------------+ LEFT     CompressibilityPhasicitySpontaneityPropertiesThrombus Aging +---------+---------------+---------+-----------+----------+--------------+ CFV      Full           Yes      Yes                                 +---------+---------------+---------+-----------+----------+--------------+  SFJ      Full                                                        +---------+---------------+---------+-----------+----------+--------------+ FV Prox  Full                                                        +---------+---------------+---------+-----------+----------+--------------+ FV Mid   Full                                                         +---------+---------------+---------+-----------+----------+--------------+ FV DistalFull           Yes      Yes                                 +---------+---------------+---------+-----------+----------+--------------+ PFV      Full                                                        +---------+---------------+---------+-----------+----------+--------------+ POP      Full           Yes      Yes                                 +---------+---------------+---------+-----------+----------+--------------+ PTV      Full                                                        +---------+---------------+---------+-----------+----------+--------------+ PERO     Full                                                        +---------+---------------+---------+-----------+----------+--------------+     Summary: RIGHT: - There is no evidence of deep vein thrombosis in the lower extremity. However, portions of this examination were limited- see technologist comments above.  - No cystic structure found in the popliteal fossa.  LEFT: - There is no evidence of deep vein thrombosis in the lower extremity. However, portions of this examination were limited- see technologist comments above.  - No cystic structure found in the popliteal fossa.  *See table(s) above for measurements and observations. Electronically signed by Heath Lark on 09/07/2020 at 5:52:54 PM.    Final    [place] EKG SITE RITE  Result Date: 08/30/2020 If Site Rite image not attached, placement could not  be confirmed due to current cardiac rhythm.  (Echo, Carotid, EGD, Colonoscopy, ERCP)    Subjective: Patient seen and examined.  Today she is on room air.  Very excited to go home.  She mobilized around, she was able to keep up her oxygen levels.  Was slightly tachypneic but not hypoxic.  She was anxious because she had not walked for long time.  Eager to go home.  Her husband will learn wound care and colostomy bag  care.   Discharge Exam: Vitals:   09/07/20 2048 09/08/20 0457  BP: (!) 164/81 (!) 165/90  Pulse: 79 83  Resp: 20 16  Temp: 98.2 F (36.8 C) 98.1 F (36.7 C)  SpO2: (!) 84% 94%   Vitals:   09/07/20 1206 09/07/20 1609 09/07/20 2048 09/08/20 0457  BP:  (!) 112/57 (!) 164/81 (!) 165/90  Pulse:  95 79 83  Resp:  20 20 16   Temp: 98.1 F (36.7 C) 97.8 F (36.6 C) 98.2 F (36.8 C) 98.1 F (36.7 C)  TempSrc: Oral Oral Oral Oral  SpO2:  94% (!) 84% 94%  Weight:      Height:        General: Pt is alert, awake, not in acute distress Today on room air. Cardiovascular: RRR, S1/S2 +, no rubs, no gallops Respiratory: CTA bilaterally, no wheezing, no rhonchi, no added sounds. Abdominal: Soft, mildly distended.  Bowel sounds present.  Colostomy bag full with brown stool.  Midline incision with some dehiscence on the lower aspect. Extremities: no edema, no cyanosis    The results of significant diagnostics from this hospitalization (including imaging, microbiology, ancillary and laboratory) are listed below for reference.     Microbiology: Recent Results (from the past 240 hour(s))  SARS CORONAVIRUS 2 (TAT 6-24 HRS) Nasopharyngeal Nasopharyngeal Swab     Status: Abnormal   Collection Time: 09/01/20  7:53 AM   Specimen: Nasopharyngeal Swab  Result Value Ref Range Status   SARS Coronavirus 2 POSITIVE (A) NEGATIVE Final    Comment: (NOTE) SARS-CoV-2 target nucleic acids are DETECTED.  The SARS-CoV-2 RNA is generally detectable in upper and lower respiratory specimens during the acute phase of infection. Positive results are indicative of the presence of SARS-CoV-2 RNA. Clinical correlation with patient history and other diagnostic information is  necessary to determine patient infection status. Positive results do not rule out bacterial infection or co-infection with other viruses.  The expected result is Negative.  Fact Sheet for  Patients: 09/03/20  Fact Sheet for Healthcare Providers: HairSlick.no  This test is not yet approved or cleared by the [place] FDA and  has been authorized for detection and/or diagnosis of SARS-CoV-2 by FDA under an Emergency Use Authorization (EUA). This EUA will remain  in effect (meaning this test can be used) for the duration of the COVID-19 declaration under Section 564(b)(1) of the Act, 21 U. S.C. section 360bbb-3(b)(1), unless the authorization is terminated or revoked sooner.   Performed at [facility], [address] [place], [place]      Labs: BNP (last 3 results) No results for input(s): BNP in the last 8760 hours. Basic Metabolic Panel: Recent Labs  Lab 09/02/20 0452 09/03/20 0409 09/05/20 1104 09/07/20 0330  NA 131* 134* 135 133*  K 3.7 4.2 4.7 4.3  CL 103 102 101 101  CO2 22 25 25 28   GLUCOSE 141* 220* 239* 225*  BUN 9 10 15 19   CREATININE 0.51 0.47 0.53 0.55  CALCIUM 7.2* 7.5*  8.1* 8.4*  MG 1.4* 1.7 1.5* 1.7  PHOS 2.3* 2.7 3.3 4.2   Liver Function Tests: Recent Labs  Lab 09/07/20 0330  AST 21  ALT 27  ALKPHOS 52  BILITOT 0.4  PROT 4.7*  ALBUMIN 1.9*   No results for input(s): LIPASE, AMYLASE in the last 168 hours. No results for input(s): AMMONIA in the last 168 hours. CBC: Recent Labs  Lab 09/02/20 0452 09/03/20 0409 09/04/20 1034 09/05/20 0227 09/07/20 0330  WBC 18.7* 18.4* 26.3* 20.6* 18.9*  HGB 9.3* 8.0* 8.3* 9.0* 8.0*  HCT 26.9* 23.3* 25.6* 26.7* 23.7*  MCV 93.7 95.1 103.6* 96.7 97.5  PLT 187 184 341 335 373   Cardiac Enzymes: No results for input(s): CKTOTAL, CKMB, CKMBINDEX, TROPONINI in the last 168 hours. BNP: Invalid input(s): POCBNP CBG: Recent Labs  Lab 09/07/20 0747 09/07/20 1204 09/07/20 1607 09/07/20 2047 09/08/20 0750  GLUCAP 162* 277* 308* 205* 81   D-Dimer Recent Labs    09/07/20 0330 09/08/20 0435  DDIMER 7.94*  9.52*   Hgb A1c No results for input(s): HGBA1C in the last 72 hours. Lipid Profile No results for input(s): CHOL, HDL, LDLCALC, TRIG, CHOLHDL, LDLDIRECT in the last 72 hours. Thyroid function studies No results for input(s): TSH, T4TOTAL, T3FREE, THYROIDAB in the last 72 hours.  Invalid input(s): FREET3 Anemia work up No results for input(s): VITAMINB12, FOLATE, FERRITIN, TIBC, IRON, RETICCTPCT in the last 72 hours. Urinalysis No results found for: COLORURINE, APPEARANCEUR, LABSPEC, PHURINE, GLUCOSEU, HGBUR, BILIRUBINUR, KETONESUR, PROTEINUR, UROBILINOGEN, NITRITE, LEUKOCYTESUR Sepsis Labs Invalid input(s): PROCALCITONIN,  WBC,  LACTICIDVEN Microbiology Recent Results (from the past 240 hour(s))  SARS CORONAVIRUS 2 (TAT 6-24 HRS) Nasopharyngeal Nasopharyngeal Swab     Status: Abnormal   Collection Time: 09/01/20  7:53 AM   Specimen: Nasopharyngeal Swab  Result Value Ref Range Status   SARS Coronavirus 2 POSITIVE (A) NEGATIVE Final    Comment: (NOTE) SARS-CoV-2 target nucleic acids are DETECTED.  The SARS-CoV-2 RNA is generally detectable in upper and lower respiratory specimens during the acute phase of infection. Positive results are indicative of the presence of SARS-CoV-2 RNA. Clinical correlation with patient history and other diagnostic information is  necessary to determine patient infection status. Positive results do not rule out bacterial infection or co-infection with other viruses.  The expected result is Negative.  Fact Sheet for Patients: HairSlick.no  Fact Sheet for Healthcare Providers: quierodirigir.com  This test is not yet approved or cleared by the [place] FDA and  has been authorized for detection and/or diagnosis of SARS-CoV-2 by FDA under an Emergency Use Authorization (EUA). This EUA will remain  in effect (meaning this test can be used) for the duration of the COVID-19 declaration under  Section 564(b)(1) of the Act, 21 U. S.C. section 360bbb-3(b)(1), unless the authorization is terminated or revoked sooner.   Performed at [facility], [address] [place], [place] [address]      Time coordinating discharge:  35 minutes  SIGNED:   Dorcas Carrow, MD  [facility] 09/08/2020, 11:06 AM

## 2020-09-08 NOTE — Progress Notes (Addendum)
10 Days Post-Op  Subjective: CC: Doing well. Tolerating diet and feels she is eating as much as she is at home + shakes (had 1 ensure yesterday). Reports she finished about 50% of her trays yesterday. No abdominal pain this morning. Had some soreness on her left abdomen yesterday that is well controlled on medications. She was afebrile. No n/v. Having ostomy output. Planned for teaching for midline and ostomy at 1pm today w/ WOCN and RN. Her husband will also be present for this. Has been working with therapies (PT yesterday). Voiding.   Objective: Vital signs in last 24 hours: Temp:  [97.8 F (36.6 C)-98.2 F (36.8 C)] 98.1 F (36.7 C) (08/26 0457) Pulse Rate:  [79-95] 83 (08/26 0457) Resp:  [16-20] 16 (08/26 0457) BP: (112-165)/(57-90) 165/90 (08/26 0457) SpO2:  [84 %-94 %] 94 % (08/26 0457) Last BM Date: 09/07/20  Intake/Output from previous day: 08/25 0701 - 08/26 0700 In: 910.9 [P.O.:480; I.V.:430.9] Out: 2575 [Urine:2270; Drains:5; Stool:300] Intake/Output this shift: No intake/output data recorded.  PE: Gen:  Alert, NAD, pleasant Pulm:  Normal rate and effort Abd: Soft, NT, midline wound is clean with small area of dehiscence at the base of the wound. No evisceration. Colostomy leaking and was changed at bedside with assistance of RN. Stoma retracted but appears pink/stool stained and is functioning. JP is SS and removed.  Psych: A&Ox3  MSK: 1+ LE edema Skin: no rashes noted, warm and dry  Lab Results:  Recent Labs    09/07/20 0330  WBC 18.9*  HGB 8.0*  HCT 23.7*  PLT 373   BMET Recent Labs    09/05/20 1104 09/07/20 0330  NA 135 133*  K 4.7 4.3  CL 101 101  CO2 25 28  GLUCOSE 239* 225*  BUN 15 19  CREATININE 0.53 0.55  CALCIUM 8.1* 8.4*   PT/INR No results for input(s): LABPROT, INR in the last 72 hours. CMP     Component Value Date/Time   NA 133 (L) 09/07/2020 0330   K 4.3 09/07/2020 0330   CL 101 09/07/2020 0330   CO2 28 09/07/2020 0330    GLUCOSE 225 (H) 09/07/2020 0330   BUN 19 09/07/2020 0330   CREATININE 0.55 09/07/2020 0330   CREATININE 1.01 (H) 04/04/2020 0000   CALCIUM 8.4 (L) 09/07/2020 0330   PROT 4.7 (L) 09/07/2020 0330   ALBUMIN 1.9 (L) 09/07/2020 0330   AST 21 09/07/2020 0330   ALT 27 09/07/2020 0330   ALKPHOS 52 09/07/2020 0330   BILITOT 0.4 09/07/2020 0330   GFRNONAA >60 09/07/2020 0330   GFRNONAA 57 (L) 04/04/2020 0000   GFRAA 66 04/04/2020 0000   Lipase     Component Value Date/Time   LIPASE 20 08/23/2020 1341    Studies/Results: DG CHEST PORT 1 VIEW  Result Date: 09/06/2020 CLINICAL DATA:  Follow-up COVID pneumonia. EXAM: PORTABLE CHEST 1 VIEW COMPARISON:  CT chest 09/02/2020; chest radiograph 08/31/2020 FINDINGS: Right upper extremity PICC tip projects at the level of the superior cavoatrial junction. Persistent small right pleural effusion with right lower lobe atelectasis. A few small locules of air project inferior to the right hemidiaphragm and likely correspond to the air noted in the probable subpulmonic fluid collection on recent CT 09/02/2020. Persistent streaky opacities in the left lower lobe, likely subsegmental atelectasis. The peripheral areas of ground-glass opacity seen on prior CT are not appreciated on today's exam. No new focal consolidation. Heart size is difficult to assess due to adjacent consolidation. Mild aortic  calcifications. IMPRESSION: 1. Small right pleural effusion with associated right lower lobe atelectasis. A few small locules of air projecting just inferior to the right hemidiaphragm are likely located in the subpulmonic fluid collection seen on CT 09/02/2020. 2. Left basilar subsegmental atelectasis, unchanged. 3. No new focal consolidation. Electronically Signed   By: Sherron AlesLaura  Parra M.D.   On: 09/06/2020 14:39   VAS US LOWER EXTREMITY VENOUS (DVT)  Result Date: 09/07/2020  Lower Venous DVT Study Patient Name:  Mauricio PoLYNNE Kersting  Date of Exam:   09/07/2020 Medical Rec #: [id number]     Accession #:    [id number] Date of Birth: 04/21/1950     Patient Gender: F Patient Age:   [age] years [facility] Procedure:      VAS US LOWER EXTREMITY VENOUS (DVT) Referring Phys: Roberta Kelly --------------------------------------------------------------------------------  Indications: Edema.  Risk Factors: COVID 19 positive. Limitations: Poor ultrasound/tissue interface. Comparison Study: No prior studies. Performing Technologist: Chanda BusingGregory Collins RVT  Examination Guidelines: A complete evaluation includes B-mode imaging, spectral Doppler, color Doppler, and power Doppler as needed of all accessible portions of each vessel. Bilateral testing is considered an integral part of a complete examination. Limited examinations for reoccurring indications may be performed as noted. The reflux portion of the exam is performed with the patient in reverse Trendelenburg.  +---------+---------------+---------+-----------+----------+--------------+ RIGHT    CompressibilityPhasicitySpontaneityPropertiesThrombus Aging +---------+---------------+---------+-----------+----------+--------------+ CFV      Full           Yes      Yes                                 +---------+---------------+---------+-----------+----------+--------------+ SFJ      Full                                                        +---------+---------------+---------+-----------+----------+--------------+ FV Prox  Full                                                        +---------+---------------+---------+-----------+----------+--------------+ FV Mid   Full           Yes      Yes                                 +---------+---------------+---------+-----------+----------+--------------+ FV DistalFull                                                        +---------+---------------+---------+-----------+----------+--------------+ PFV      Full                                                         +---------+---------------+---------+-----------+----------+--------------+ POP  Full           Yes      Yes                                 +---------+---------------+---------+-----------+----------+--------------+ PTV      Full                                                        +---------+---------------+---------+-----------+----------+--------------+ PERO     Full                                                        +---------+---------------+---------+-----------+----------+--------------+   +---------+---------------+---------+-----------+----------+--------------+ LEFT     CompressibilityPhasicitySpontaneityPropertiesThrombus Aging +---------+---------------+---------+-----------+----------+--------------+ CFV      Full           Yes      Yes                                 +---------+---------------+---------+-----------+----------+--------------+ SFJ      Full                                                        +---------+---------------+---------+-----------+----------+--------------+ FV Prox  Full                                                        +---------+---------------+---------+-----------+----------+--------------+ FV Mid   Full                                                        +---------+---------------+---------+-----------+----------+--------------+ FV DistalFull           Yes      Yes                                 +---------+---------------+---------+-----------+----------+--------------+ PFV      Full                                                        +---------+---------------+---------+-----------+----------+--------------+ POP      Full           Yes      Yes                                 +---------+---------------+---------+-----------+----------+--------------+  PTV      Full                                                         +---------+---------------+---------+-----------+----------+--------------+ PERO     Full                                                        +---------+---------------+---------+-----------+----------+--------------+     Summary: RIGHT: - There is no evidence of deep vein thrombosis in the lower extremity. However, portions of this examination were limited- see technologist comments above.  - No cystic structure found in the popliteal fossa.  LEFT: - There is no evidence of deep vein thrombosis in the lower extremity. However, portions of this examination were limited- see technologist comments above.  - No cystic structure found in the popliteal fossa.  *See table(s) above for measurements and observations. Electronically signed by Heath Lark on 09/07/2020 at 5:52:54 PM.    Final     Anti-infectives: Anti-infectives (From admission, onward)    Start     Dose/Rate Route Frequency Ordered Stop   09/03/20 1000  remdesivir 100 mg in sodium chloride 0.9 % 100 mL IVPB       See Hyperspace for full Linked Orders Report.   100 mg 200 mL/hr over 30 Minutes Intravenous Daily 09/02/20 1414 09/04/20 1043   09/02/20 1500  remdesivir 200 mg in sodium chloride 0.9% 250 mL IVPB       See Hyperspace for full Linked Orders Report.   200 mg 580 mL/hr over 30 Minutes Intravenous Once 09/02/20 1414 09/02/20 2312   08/27/20 1530  ertapenem (INVANZ) 1,000 mg in sodium chloride 0.9 % 100 mL IVPB        1 g 200 mL/hr over 30 Minutes Intravenous Every 24 hours 08/27/20 1406 09/08/20 2359   08/24/20 1145  ceFEPIme (MAXIPIME) 2 g in sodium chloride 0.9 % 100 mL IVPB  Status:  Discontinued        2 g 200 mL/hr over 30 Minutes Intravenous Every 12 hours 08/24/20 1051 08/27/20 1406   08/23/20 2100  ceFEPIme (MAXIPIME) 2 g in sodium chloride 0.9 % 100 mL IVPB  Status:  Discontinued        2 g 200 mL/hr over 30 Minutes Intravenous Every 24 hours 08/23/20 2051 08/24/20 1051   08/23/20 1700  ciprofloxacin (CIPRO)  IVPB 400 mg  Status:  Discontinued        400 mg 200 mL/hr over 60 Minutes Intravenous Every 12 hours 08/23/20 1645 08/23/20 2041   08/23/20 1700  metroNIDAZOLE (FLAGYL) IVPB 500 mg  Status:  Discontinued        500 mg 100 mL/hr over 60 Minutes Intravenous Every 8 hours 08/23/20 1645 08/27/20 1406        Assessment/Plan POD#10 s/p ex lap and Hartman's procedure with colostomy by AR 8/16 for diverticulitis with abscess - JP drain removed  - On Invanz. Finish abx today - Patient w/ robf and tol soft diet. She reports she is eating the normal amount she would eat at home + shakes.  - Continue BID dressing changes - Path w/ diverticulitis w/o  evidence of malignancy  - IS/pulm toilet - mobilize, PT rec HH  - I have written for [facility] PT/OT/RN and reached out to [facility] who confirmed this will be arranged - I have spoken with WOCN and RN about meeting with patient and husband at 1pm for teaching on midline wound dressing changing and colostomy bag emptying/changing etc - If patient and her husband feel comfortable w/ colostomy care and dressing changes - patient is okay for discharge from our standpoint. Will arrange follow up and send pain medication to pharmacy. Will send referral to ostomy clinic as well. Will ask PT to work with her again today.    FEN: Soft, stop TNA ID: ertapenem 8/14 - 8/26 VTE: scds, lovenox Foley: out 8/17, voiding   HTN AKI - resolved  COVID+ - per primary team, off o2. CTA negative for PE 8/20. On remdesivir and steriods Leukocytosis - difficult to know if this is from steroids, covid, etc. Clinically doing well without pain or tenderness; is tolerating a diet without n/v and having bowel function.  R pleural effusion LE edema - improved. LE [place] neg for DVT   LOS: 16 days    Jacinto Halim , [facility] 09/08/2020, 10:31 AM Please see Amion for pager number during day hours 7:00am-4:30pm

## 2020-09-08 NOTE — Progress Notes (Signed)
OT Cancellation Note  Patient Details Name: Anne Aguirre MRN: [id number] DOB: [birth year]   Cancelled Treatment:    Reason Eval/Treat Not Completed: Fatigue/lethargy limiting ability to participate. RN reports recently completing dressing changes with patient request to rest at this time. OT to check back as time allows.   Anne Aguirre OTR/L Supplemental OT, Department of rehab services [phone]  [name]. 09/08/2020, 9:48 AM

## 2020-09-08 NOTE — Final Consult Note (Signed)
[place] Nurse ostomy follow up Patient receiving care in [facility] 2W10 Being discharged today home with [facility].  Stoma type/location: LUQ end colostomy Stomal assessment/size: Not assessed today as the pouch was changed this AM with the bedside nurse.  Peristomal assessment:  Treatment options for stomal/peristomal skin: Barrier ring Output; Dark brown mushy Ostomy pouching: 1pc. Convex Education provided: patient and spouse was in the room and we reviewed the entire process of removing the pouch, cleaning around the stoma, placing a barrier ring around the stoma, cutting the pouch to size, placing the pouch around the stoma, opening and closing the pouch and emptying the pouch when 1/3 full. May use mirror when at home for better view of the process. May empty pouch in the toilet by sitting and emptying between the legs or standing and leaning over the toilet. Always change pouch if leaking. Change pouch at least twice a week. Referral to Ostomy clinic. Someone should call for appointment. Ostomy clinic handout and handout with step by step pouch changing instructions. Take a shower with or without the pouch on. Measuring the stoma. Calling the Ostomy clinic if any further questions after discharge. Setting up supply delivery with [employer]. SS kit has been received at home per spouse.  Enrolled patient in [place] Start Discharge program: Yes previously. WOC will sign off at this time.   Cathlean Marseilles Tamala Julian, MSN, RN, [place], Lysle Pearl, [facility] Wound Treatment Associate Pager [phone]

## 2020-09-08 NOTE — Progress Notes (Signed)
Calorie Count Note  48 hour calorie count ordered.  Diet: soft Supplements: Ensure Enlive BID, Magic Cup BID  Day 1 -- Pt ate 0% of breakfast and lunch then only 5% of dinner. No documentation provided on supplement intake. Regardless, intake over day 1 was inadequate to meet pt's nutritional needs.  Day 2 -- Breakfast: 75 kcals, 3g protein Lunch: 300 kcals, 10g protein Dinner: 86 kcals, 3g protein Supplements: 350 kcals, 20g protein  Total intake: 811 kcal (50% of minimum estimated needs)  36g protein (45% of minimum estimated needs)  Nutrition Dx: Inadequate oral intake related to acute illness (diverticulitis) as evidenced by per patient/family report, meal completion < 25%. -- ongoing  Goal: Patient will meet greater than or equal to 90% of their needs --  not met  Intervention:  -d/c calorie count -Continue Boost Breeze po TID, each supplement provides 250 kcal and 9 grams of protein -Continue Magic Cup BID with meals  Note pt has discharge orders signed as pt reports that she "feels she is eating as much as she does at home." Ate approx. 257 kcals and 13g protein for breakfast today. Denies N/V/abdominal pain.   Larkin Ina, MS, RD, LDN (she/her/hers) RD pager number and weekend/on-call pager number located in [place].

## 2020-09-10 ENCOUNTER — Other Ambulatory Visit: Payer: Self-pay

## 2020-09-10 ENCOUNTER — Encounter (HOSPITAL_COMMUNITY): Payer: Self-pay

## 2020-09-10 ENCOUNTER — Inpatient Hospital Stay (HOSPITAL_COMMUNITY)
Admission: EM | Admit: 2020-09-10 | Discharge: 2020-09-18 | DRG: 862 | Disposition: A | Discharge status: Home Health | Payer: Medicare PPO | Attending: Family Medicine | Admitting: Family Medicine

## 2020-09-10 ENCOUNTER — Emergency Department (HOSPITAL_COMMUNITY): Payer: Medicare PPO

## 2020-09-10 DIAGNOSIS — E785 Hyperlipidemia, unspecified: Secondary | ICD-10-CM | POA: Diagnosis present

## 2020-09-10 DIAGNOSIS — Z933 Colostomy status: Secondary | ICD-10-CM

## 2020-09-10 DIAGNOSIS — I10 Essential (primary) hypertension: Secondary | ICD-10-CM | POA: Diagnosis not present

## 2020-09-10 DIAGNOSIS — U071 COVID-19: Secondary | ICD-10-CM | POA: Diagnosis not present

## 2020-09-10 DIAGNOSIS — Z823 Family history of stroke: Secondary | ICD-10-CM | POA: Diagnosis not present

## 2020-09-10 DIAGNOSIS — Z980 Intestinal bypass and anastomosis status: Secondary | ICD-10-CM | POA: Diagnosis not present

## 2020-09-10 DIAGNOSIS — T8141XA Infection following a procedure, superficial incisional surgical site, initial encounter: Secondary | ICD-10-CM | POA: Diagnosis present

## 2020-09-10 DIAGNOSIS — R8271 Bacteriuria: Secondary | ICD-10-CM | POA: Diagnosis not present

## 2020-09-10 DIAGNOSIS — R627 Adult failure to thrive: Secondary | ICD-10-CM | POA: Diagnosis present

## 2020-09-10 DIAGNOSIS — K5720 Diverticulitis of large intestine with perforation and abscess without bleeding: Secondary | ICD-10-CM | POA: Diagnosis not present

## 2020-09-10 DIAGNOSIS — R109 Unspecified abdominal pain: Secondary | ICD-10-CM | POA: Diagnosis not present

## 2020-09-10 DIAGNOSIS — I959 Hypotension, unspecified: Secondary | ICD-10-CM | POA: Diagnosis not present

## 2020-09-10 DIAGNOSIS — Z6824 Body mass index (BMI) 24.0-24.9, adult: Secondary | ICD-10-CM

## 2020-09-10 DIAGNOSIS — E162 Hypoglycemia, unspecified: Secondary | ICD-10-CM | POA: Diagnosis present

## 2020-09-10 DIAGNOSIS — R509 Fever, unspecified: Secondary | ICD-10-CM | POA: Diagnosis not present

## 2020-09-10 DIAGNOSIS — A419 Sepsis, unspecified organism: Secondary | ICD-10-CM | POA: Diagnosis not present

## 2020-09-10 DIAGNOSIS — E8809 Other disorders of plasma-protein metabolism, not elsewhere classified: Secondary | ICD-10-CM | POA: Diagnosis present

## 2020-09-10 DIAGNOSIS — J9811 Atelectasis: Secondary | ICD-10-CM | POA: Diagnosis present

## 2020-09-10 DIAGNOSIS — J90 Pleural effusion, not elsewhere classified: Secondary | ICD-10-CM | POA: Diagnosis not present

## 2020-09-10 DIAGNOSIS — Z9049 Acquired absence of other specified parts of digestive tract: Secondary | ICD-10-CM | POA: Diagnosis not present

## 2020-09-10 DIAGNOSIS — R52 Pain, unspecified: Secondary | ICD-10-CM | POA: Diagnosis not present

## 2020-09-10 DIAGNOSIS — Z79899 Other long term (current) drug therapy: Secondary | ICD-10-CM

## 2020-09-10 DIAGNOSIS — D539 Nutritional anemia, unspecified: Secondary | ICD-10-CM | POA: Diagnosis present

## 2020-09-10 DIAGNOSIS — Z8249 Family history of ischemic heart disease and other diseases of the circulatory system: Secondary | ICD-10-CM

## 2020-09-10 DIAGNOSIS — E871 Hypo-osmolality and hyponatremia: Secondary | ICD-10-CM | POA: Diagnosis not present

## 2020-09-10 DIAGNOSIS — T8143XA Infection following a procedure, organ and space surgical site, initial encounter: Secondary | ICD-10-CM | POA: Diagnosis not present

## 2020-09-10 DIAGNOSIS — Z9882 Breast implant status: Secondary | ICD-10-CM | POA: Diagnosis not present

## 2020-09-10 DIAGNOSIS — K750 Abscess of liver: Secondary | ICD-10-CM | POA: Diagnosis present

## 2020-09-10 DIAGNOSIS — K7689 Other specified diseases of liver: Secondary | ICD-10-CM | POA: Diagnosis not present

## 2020-09-10 DIAGNOSIS — K651 Peritoneal abscess: Secondary | ICD-10-CM

## 2020-09-10 DIAGNOSIS — K5792 Diverticulitis of intestine, part unspecified, without perforation or abscess without bleeding: Secondary | ICD-10-CM | POA: Diagnosis not present

## 2020-09-10 DIAGNOSIS — T8130XA Disruption of wound, unspecified, initial encounter: Secondary | ICD-10-CM | POA: Diagnosis present

## 2020-09-10 DIAGNOSIS — K6389 Other specified diseases of intestine: Secondary | ICD-10-CM | POA: Diagnosis not present

## 2020-09-10 DIAGNOSIS — K219 Gastro-esophageal reflux disease without esophagitis: Secondary | ICD-10-CM | POA: Diagnosis present

## 2020-09-10 DIAGNOSIS — N179 Acute kidney failure, unspecified: Secondary | ICD-10-CM | POA: Diagnosis not present

## 2020-09-10 DIAGNOSIS — L02211 Cutaneous abscess of abdominal wall: Secondary | ICD-10-CM | POA: Diagnosis not present

## 2020-09-10 DIAGNOSIS — M1990 Unspecified osteoarthritis, unspecified site: Secondary | ICD-10-CM | POA: Diagnosis present

## 2020-09-10 DIAGNOSIS — R140 Abdominal distension (gaseous): Secondary | ICD-10-CM

## 2020-09-10 DIAGNOSIS — E876 Hypokalemia: Secondary | ICD-10-CM | POA: Diagnosis not present

## 2020-09-10 DIAGNOSIS — R0689 Other abnormalities of breathing: Secondary | ICD-10-CM | POA: Diagnosis not present

## 2020-09-10 HISTORY — DX: Sepsis, unspecified organism: A41.9

## 2020-09-10 HISTORY — DX: Infection following a procedure, organ and space surgical site, initial encounter: T81.43XA

## 2020-09-10 HISTORY — DX: COVID-19: U07.1

## 2020-09-10 HISTORY — DX: Peritoneal abscess: K65.1

## 2020-09-10 LAB — URINALYSIS, ROUTINE W REFLEX MICROSCOPIC
Bilirubin Urine: NEGATIVE
Glucose, UA: NEGATIVE mg/dL
Hgb urine dipstick: NEGATIVE
Ketones, ur: NEGATIVE mg/dL
Leukocytes,Ua: NEGATIVE
Nitrite: NEGATIVE
Protein, ur: NEGATIVE mg/dL
Specific Gravity, Urine: 1.025 (ref 1.005–1.030)
pH: 7 (ref 5.0–8.0)

## 2020-09-10 LAB — COMPREHENSIVE METABOLIC PANEL
ALT: 37 U/L (ref 0–44)
AST: 32 U/L (ref 15–41)
Albumin: 2.3 g/dL — ABNORMAL LOW (ref 3.5–5.0)
Alkaline Phosphatase: 65 U/L (ref 38–126)
Anion gap: 7 (ref 5–15)
BUN: 15 mg/dL (ref 8–23)
CO2: 23 mmol/L (ref 22–32)
Calcium: 8.2 mg/dL — ABNORMAL LOW (ref 8.9–10.3)
Chloride: 100 mmol/L (ref 98–111)
Creatinine, Ser: 0.77 mg/dL (ref 0.44–1.00)
GFR, Estimated: >60 mL/min (ref >60)
Glucose, Bld: 111 mg/dL — ABNORMAL HIGH (ref 70–99)
Potassium: 3.8 mmol/L (ref 3.5–5.1)
Sodium: 130 mmol/L — ABNORMAL LOW (ref 135–145)
Total Bilirubin: 1.2 mg/dL (ref 0.3–1.2)
Total Protein: 5.4 g/dL — ABNORMAL LOW (ref 6.5–8.1)

## 2020-09-10 LAB — LACTIC ACID, PLASMA
Lactic Acid, Venous: 1.1 mmol/L (ref 0.5–1.9)
Lactic Acid, Venous: 1.3 mmol/L (ref 0.5–1.9)

## 2020-09-10 LAB — CBC WITH DIFFERENTIAL/PLATELET
Abs Immature Granulocytes: 0.48 10*3/uL — ABNORMAL HIGH (ref 0.00–0.07)
Basophils Absolute: 0.1 10*3/uL (ref 0.0–0.1)
Basophils Relative: 0 %
Eosinophils Absolute: 0.1 10*3/uL (ref 0.0–0.5)
Eosinophils Relative: 0 %
HCT: 29.7 % — ABNORMAL LOW (ref 36.0–46.0)
Hemoglobin: 9.9 g/dL — ABNORMAL LOW (ref 12.0–15.0)
Immature Granulocytes: 2 %
Lymphocytes Relative: 5 %
Lymphs Abs: 1.4 10*3/uL (ref 0.7–4.0)
MCH: 32.4 pg (ref 26.0–34.0)
MCHC: 33.3 g/dL (ref 30.0–36.0)
MCV: 97.1 fL (ref 80.0–100.0)
Monocytes Absolute: 1.8 10*3/uL — ABNORMAL HIGH (ref 0.1–1.0)
Monocytes Relative: 6 %
Neutro Abs: 26.6 10*3/uL — ABNORMAL HIGH (ref 1.7–7.7)
Neutrophils Relative %: 87 %
Platelets: 424 10*3/uL — ABNORMAL HIGH (ref 150–400)
RBC: 3.06 MIL/uL — ABNORMAL LOW (ref 3.87–5.11)
RDW: 14.2 % (ref 11.5–15.5)
WBC: 30.4 10*3/uL — ABNORMAL HIGH (ref 4.0–10.5)
nRBC: 0 % (ref 0.0–0.2)

## 2020-09-10 LAB — RESP PANEL BY RT-PCR (FLU A&B, COVID) ARPGX2
Influenza A by PCR: NEGATIVE
Influenza B by PCR: NEGATIVE
SARS Coronavirus 2 by RT PCR: POSITIVE — AB

## 2020-09-10 LAB — PROTIME-INR
INR: 1.1 (ref 0.8–1.2)
Prothrombin Time: 14.4 seconds (ref 11.4–15.2)

## 2020-09-10 LAB — APTT: aPTT: 29 seconds (ref 24–36)

## 2020-09-10 MED ORDER — BISACODYL 10 MG RE SUPP: 10.0000 mg | Freq: Every day | RECTAL | Status: DC | PRN

## 2020-09-10 MED ORDER — AMLODIPINE BESYLATE 10 MG PO TABS
10.0000 mg | ORAL_TABLET | Freq: Every day | ORAL | Status: DC
  Administered 2020-09-11 – 2020-09-18 (×8): 10 mg via ORAL
  Filled 2020-09-10 (×5): qty 1
  Filled 2020-09-10: qty 2
  Filled 2020-09-10 (×3): qty 1

## 2020-09-10 MED ORDER — SODIUM CHLORIDE 0.9% FLUSH
3.0000 mL | Freq: Two times a day (BID) | INTRAVENOUS | Status: DC
  Administered 2020-09-10 – 2020-09-12 (×2): 3 mL via INTRAVENOUS

## 2020-09-10 MED ORDER — LACTATED RINGERS IV BOLUS
1000.0000 mL | Freq: Once | INTRAVENOUS | Status: AC
  Administered 2020-09-10: 1000 mL via INTRAVENOUS

## 2020-09-10 MED ORDER — IOHEXOL 350 MG/ML SOLN
100.0000 mL | Freq: Once | INTRAVENOUS | Status: AC | PRN
  Administered 2020-09-10: 75 mL via INTRAVENOUS

## 2020-09-10 MED ORDER — ACETAMINOPHEN 650 MG RE SUPP: 650.0000 mg | Freq: Four times a day (QID) | RECTAL | Status: DC | PRN

## 2020-09-10 MED ORDER — ADULT MULTIVITAMIN W/MINERALS CH
1.0000 | ORAL_TABLET | Freq: Every day | ORAL | Status: DC
  Administered 2020-09-10 – 2020-09-18 (×7): 1 via ORAL
  Filled 2020-09-10 (×9): qty 1

## 2020-09-10 MED ORDER — ACETAMINOPHEN 325 MG PO TABS
650.0000 mg | ORAL_TABLET | Freq: Once | ORAL | Status: AC
  Administered 2020-09-10: 650 mg via ORAL
  Filled 2020-09-10: qty 2

## 2020-09-10 MED ORDER — SODIUM CHLORIDE 0.9 % IV SOLN: 250.0000 mL | INTRAVENOUS | Status: DC | PRN

## 2020-09-10 MED ORDER — PIPERACILLIN-TAZOBACTAM 3.375 G IVPB
3.3750 g | Freq: Three times a day (TID) | INTRAVENOUS | Status: DC
  Administered 2020-09-10 – 2020-09-17 (×22): 3.375 g via INTRAVENOUS
  Filled 2020-09-10 (×22): qty 50

## 2020-09-10 MED ORDER — TRAZODONE HCL 50 MG PO TABS
50.0000 mg | ORAL_TABLET | Freq: Every evening | ORAL | Status: DC | PRN
  Administered 2020-09-11 – 2020-09-17 (×5): 50 mg via ORAL
  Filled 2020-09-10 (×6): qty 1

## 2020-09-10 MED ORDER — ONDANSETRON HCL 4 MG PO TABS: 4.0000 mg | ORAL_TABLET | Freq: Four times a day (QID) | ORAL | Status: DC | PRN

## 2020-09-10 MED ORDER — ONDANSETRON HCL 4 MG/2ML IJ SOLN: 4.0000 mg | Freq: Four times a day (QID) | INTRAMUSCULAR | Status: DC | PRN

## 2020-09-10 MED ORDER — NEBIVOLOL HCL 10 MG PO TABS
20.0000 mg | ORAL_TABLET | Freq: Every day | ORAL | Status: DC
  Administered 2020-09-10 – 2020-09-18 (×9): 20 mg via ORAL
  Filled 2020-09-10 (×10): qty 2

## 2020-09-10 MED ORDER — LACTATED RINGERS IV BOLUS
500.0000 mL | Freq: Once | INTRAVENOUS | Status: AC
  Administered 2020-09-10: 500 mL via INTRAVENOUS

## 2020-09-10 MED ORDER — HEPARIN SODIUM (PORCINE) 5000 UNIT/ML IJ SOLN
5000.0000 [IU] | Freq: Three times a day (TID) | INTRAMUSCULAR | Status: DC
Start: 2020-09-11 — End: 2020-09-16
  Administered 2020-09-11 – 2020-09-16 (×15): 5000 [IU] via SUBCUTANEOUS
  Filled 2020-09-10 (×15): qty 1

## 2020-09-10 MED ORDER — HYDRALAZINE HCL 20 MG/ML IJ SOLN: 10.0000 mg | INTRAMUSCULAR | Status: DC | PRN

## 2020-09-10 MED ORDER — ACETAMINOPHEN 325 MG PO TABS
650.0000 mg | ORAL_TABLET | Freq: Four times a day (QID) | ORAL | Status: DC | PRN
  Administered 2020-09-10 – 2020-09-17 (×15): 650 mg via ORAL
  Filled 2020-09-10 (×15): qty 2

## 2020-09-10 MED ORDER — SODIUM CHLORIDE 0.9% FLUSH
3.0000 mL | Freq: Two times a day (BID) | INTRAVENOUS | Status: DC
  Administered 2020-09-10 – 2020-09-15 (×4): 3 mL via INTRAVENOUS

## 2020-09-10 MED ORDER — OXYCODONE HCL 5 MG PO TABS
5.0000 mg | ORAL_TABLET | Freq: Four times a day (QID) | ORAL | Status: DC | PRN
  Administered 2020-09-12 (×2): 5 mg via ORAL
  Filled 2020-09-10 (×2): qty 1

## 2020-09-10 MED ORDER — SODIUM CHLORIDE 0.9% FLUSH: 3.0000 mL | INTRAVENOUS | Status: DC | PRN

## 2020-09-10 MED ORDER — POLYETHYLENE GLYCOL 3350 17 G PO PACK: 17.0000 g | PACK | Freq: Every day | ORAL | Status: DC | PRN

## 2020-09-10 NOTE — ED Notes (Signed)
Spoke with [facility] at this time to set up transportation.

## 2020-09-10 NOTE — ED Notes (Signed)
Pt noted with new stoma from colostomy placement that occurred approx 1 week ago, unable to get get clear view of stoma at this time. Appears to be light pink with dark red/brown outer edges.

## 2020-09-10 NOTE — Progress Notes (Signed)
Pharmacy Antibiotic Note  Jonique Kulig a 70 y.o. female admitted on 09/10/2020 with  intra abdominal infection .  Pharmacy has been consulted for zosyn dosing.  Plan: Zosyn 3.375g IV q8h (4 hour infusion).   Medical History: Past Medical History:  Diagnosis Date   Essential hypertension, benign 10/01/2018   HLD (hyperlipidemia) 10/01/2018   Osteoarthritis 10/01/2018   Vitamin D deficiency disease 10/01/2018    Allergies:  No Known Allergies  Filed Weights   09/10/20 0901 09/10/20 0927  Weight: 72 kg (158 lb 11.7 oz) 69.5 kg (153 lb 4.8 oz)    CBC Latest Ref Rng & Units 09/10/2020 09/07/2020 09/05/2020  WBC 4.0 - 10.5 K/uL 30.4(H) 18.9(H) 20.6(H)  Hemoglobin 12.0 - 15.0 g/dL 0.3(E) 0.9(Q) 3.3(A)  Hematocrit 36.0 - 46.0 % 29.7(L) 23.7(L) 26.7(L)  Platelets 150 - 400 K/uL 424(H) 373 335     Estimated Creatinine Clearance: 63 mL/min (by C-G formula based on SCr of 0.77 mg/dL).  Antibiotics Given (last 72 hours)     None       Antimicrobials this admission:  zosyn 09/10/2020  >>   Microbiology results: 09/10/2020  BCx: sent 09/10/2020  UCx: sent  Thank you for allowing pharmacy to be a part of this patient's care.  Luan Pulling, PharmD Clinical Pharmacist

## 2020-09-10 NOTE — ED Provider Notes (Signed)
[facility] EMERGENCY DEPARTMENT Provider Note   CSN: [id number] Arrival date & time: 09/10/20  8295     History No chief complaint on file.   Anne Aguirre is a 70 y.o. female.  HPI Patient is a 70 year old female with a history of essential hypertension, hyperlipidemia, diverticulitis, who presents to the emergency department due to stomach pain.  Patient had a recent admission on August 10 and was discharged on August 26 for diverticulitis.  During this admission she was found to have sigmoid diverticulitis with a local abscess.  She was initially treated conservatively but after persistent abdominal pain and an unresolved infection she underwent a Hartmann procedure on August 16.  Postoperatively she developed some breathing difficulties and was diagnosed with COVID-19 infection.  While in the hospital she received 10 days of antibiotic therapy and was removed from TPN.  She also completed 7 days of IV steroids as well as remdesivir for her COVID-19.  She was found to have dehiscence of the inferior aspect of her midline incision and was instructed on wound care.    Patient presents today due to abdominal pain and fevers that began this morning.  Patient states she woke up with her symptoms.  Reports T-max of 52 F which she measured orally.  Reports associated abdominal distention.  Patient denies any increased output or changes in consistency of the stool in her ostomy bag.  Patient states she changed the bandaging and repacked the wound with gauze last night.    Past Medical History:  Diagnosis Date   Essential hypertension, benign 10/01/2018   HLD (hyperlipidemia) 10/01/2018   Osteoarthritis 10/01/2018   Vitamin D deficiency disease 10/01/2018    Patient Active Problem List   Diagnosis Date Noted   Acute diverticulitis 08/23/2020   Acute renal failure superimposed on stage 3a chronic kidney disease (HCC) 08/23/2020   Hyponatremia 08/23/2020   Hyperbilirubinemia 08/23/2020    Hypercalcemia 08/23/2020   Essential hypertension, benign 10/01/2018   HLD (hyperlipidemia) 10/01/2018   Vitamin D deficiency disease 10/01/2018   Osteoarthritis 10/01/2018    Past Surgical History:  Procedure Laterality Date   ABDOMINAL SURGERY     COLON RESECTION N/A 08/29/2020   Procedure: COLON RESECTION;  Surgeon: Axel Filler, MD;  Location: [facility] OR;  Service: General;  Laterality: N/A;   COLON SURGERY     COLOSTOMY N/A 08/29/2020   Procedure: COLOSTOMY;  Surgeon: Axel Filler, MD;  Location: [facility] OR;  Service: General;  Laterality: N/A;   LAPAROTOMY N/A 08/29/2020   Procedure: EXPLORATORY LAPAROTOMY;  Surgeon: Axel Filler, MD;  Location: [facility] OR;  Service: General;  Laterality: N/A;   TONSILLECTOMY Bilateral      OB History   No obstetric history on file.     Family History  Problem Relation Age of Onset   Hypertension Mother    Stroke Mother    Stroke Father    Hypertension Brother    Hypertension Son     Social History   Tobacco Use   Smoking status: Never   Smokeless tobacco: Never  Vaping Use   Vaping Use: Never used  Substance Use Topics   Alcohol use: Yes    Alcohol/week: 7.0 standard drinks    Types: 7 Glasses of wine per week    Comment: Red wine   Drug use: Never    Home Medications Prior to Admission medications   Medication Sig Start Date End Date Taking? Authorizing Provider  amLODipine (NORVASC) 10 MG tablet Take 1 tablet (10 mg  total) by mouth daily. 03/30/20   Wilson Singer, MD  irbesartan (AVAPRO) 300 MG tablet Take 1 tablet (300 mg total) by mouth daily. 05/30/20   Wilson Singer, MD  Nebivolol HCl 20 MG TABS Take 1 tablet (20 mg total) by mouth daily at 12 noon. 05/30/20   Wilson Singer, MD  oxyCODONE (OXY IR/ROXICODONE) 5 MG immediate release tablet Take 1 tablet (5 mg total) by mouth every 6 (six) hours as needed for breakthrough pain. 09/08/20   Maczis, Elmer Sow, PA-C    Allergies    Patient has no known  allergies.  Review of Systems   Review of Systems  All other systems reviewed and are negative. Ten systems reviewed and are negative for acute change, except as noted in the HPI.   Physical Exam Updated Vital Signs BP 139/76   Pulse 100   Temp (!) 100.8 F (38.2 C) (Rectal)   Resp (!) 22   Ht 5\' 6"  (1.676 m)   Wt 69.5 kg   SpO2 95%   BMI 24.74 kg/m   Physical Exam Vitals and nursing note reviewed.  Constitutional:      General: She is not in acute distress.    Appearance: She is not ill-appearing, toxic-appearing or diaphoretic.  HENT:     Head: Normocephalic and atraumatic.     Right Ear: External ear normal.     Left Ear: External ear normal.     Nose: Nose normal.     Mouth/Throat:     Mouth: Mucous membranes are moist.     Pharynx: Oropharynx is clear. No oropharyngeal exudate or posterior oropharyngeal erythema.  Eyes:     General: No scleral icterus.       Right eye: No discharge.        Left eye: No discharge.     Extraocular Movements: Extraocular movements intact.     Conjunctiva/sclera: Conjunctivae normal.  Cardiovascular:     Rate and Rhythm: Regular rhythm. Tachycardia present.     Pulses: Normal pulses.     Heart sounds: Normal heart sounds. No murmur heard.   No friction rub. No gallop.  Pulmonary:     Effort: Pulmonary effort is normal. No respiratory distress.     Breath sounds: Normal breath sounds. No stridor. No wheezing, rhonchi or rales.  Abdominal:     General: Abdomen is flat. There is distension.     Palpations: Abdomen is soft.     Tenderness: There is abdominal tenderness.     Comments: Protuberant abdomen that is soft.  Moderate distention noted.  Diffuse abdominal tenderness appreciated.  Please see images below of her surgical wound.  Sterile gauze was removed from the wound.  Gauze appeared to be mostly saturated with purulent drainage.  No active drainage noted from the wound.  Colostomy bag noted in the left portion of the abdomen  that is moderately filled with brown/yellow stool.  Musculoskeletal:        General: Normal range of motion.     Cervical back: Normal range of motion and neck supple. No tenderness.  Skin:    General: Skin is warm and dry.  Neurological:     General: No focal deficit present.     Mental Status: She is alert and oriented to person, place, and time.  Psychiatric:        Mood and Affect: Mood normal.        Behavior: Behavior normal.    ED Results / Procedures /  Treatments   Labs (all labs ordered are listed, but only abnormal results are displayed) Labs Reviewed  COMPREHENSIVE METABOLIC PANEL - Abnormal; Notable for the following components:      Result Value   Sodium 130 (*)    Glucose, Bld 111 (*)    Calcium 8.2 (*)    Total Protein 5.4 (*)    Albumin 2.3 (*)    All other components within normal limits  CBC WITH DIFFERENTIAL/PLATELET - Abnormal; Notable for the following components:   WBC 30.4 (*)    RBC 3.06 (*)    Hemoglobin 9.9 (*)    HCT 29.7 (*)    Platelets 424 (*)    Neutro Abs 26.6 (*)    Monocytes Absolute 1.8 (*)    Abs Immature Granulocytes 0.48 (*)    All other components within normal limits  CULTURE, BLOOD (ROUTINE X 2)  URINE CULTURE  CULTURE, BLOOD (ROUTINE X 2)  RESP PANEL BY RT-PCR (FLU A&B, COVID) ARPGX2  LACTIC ACID, PLASMA  PROTIME-INR  APTT  LACTIC ACID, PLASMA  URINALYSIS, ROUTINE W REFLEX MICROSCOPIC   EKG None  Radiology CT ABDOMEN PELVIS W CONTRAST  Result Date: 09/10/2020 CLINICAL DATA:  Abdominal pain, fever. Recent surgery for diverticulitis with abscess. New onset fevers and abdominal pain this morning. EXAM: CT ABDOMEN AND PELVIS WITH CONTRAST TECHNIQUE: Multidetector CT imaging of the abdomen and pelvis was performed using the standard protocol following bolus administration of intravenous contrast. CONTRAST:  3mL OMNIPAQUE IOHEXOL 350 MG/ML SOLN COMPARISON:  CT abdomen dated 08/25/2020. FINDINGS: Lower chest: Small RIGHT pleural  effusion with compressive atelectasis. Additional mild bibasilar scarring/atelectasis. Hepatobiliary: Hepatic cysts. Gallbladder is unremarkable. Nose significant bile duct dilatation appreciated. Pancreas: Unremarkable. No pancreatic ductal dilatation or surrounding inflammatory changes. Spleen: Normal in size without focal abnormality. Adrenals/Urinary Tract: Adrenal glands appear normal. Kidneys are unremarkable without suspicious mass, stone or hydronephrosis. Stomach/Bowel: Surgical changes of a partial colon resection in the posterior pelvis and LEFT abdominal wall ostomy creation. Mildly distended gas-filled loops of small bowel are seen within the RIGHT abdomen, likely ileus. Thickening of the walls of the small bowel in the RIGHT abdomen and upper pelvis is likely reactive in nature. Vascular/Lymphatic: Aortic atherosclerosis. No acute appearing vascular abnormality. No enlarged lymph nodes are identified. Reproductive: Uterus and bilateral adnexa are unremarkable. Other: Small amount of free fluid scattered throughout the abdomen and pelvis. Slightly more circumscribed fluid collection within the RIGHT upper quadrant, underlying the RIGHT liver lobe and extending into the upper paracolic gutter, worrisome for early developing abscess, measuring approximately 5 cm greatest dimension (series 2, image 32; coronal series 5, images 51 through 59). Additional fluid is seen over the RIGHT liver lobe, possibly contiguous. No free intraperitoneal air. No evidence of acute hemorrhage within the abdomen or pelvis. Musculoskeletal: Degenerative spondylosis of the scoliotic lower lumbar spine, at least moderate in degree. No acute-appearing osseous abnormality. Surgical defect within the anterior abdominal wall, with associated packing. IMPRESSION: 1. Circumscribed fluid collection within the RIGHT upper quadrant, underlying the RIGHT liver lobe and extending into the upper RIGHT paracolic gutter, worrisome for early  developing abscess, measuring approximately 5 cm greatest dimension. Additional fluid is seen over the RIGHT liver lobe, possibly contiguous. 2. No additional circumscribed fluid collection or abscess-like collection is seen within the abdomen or pelvis. Expected small amount of free fluid scattered throughout the abdomen and pelvis. No evidence of acute hemorrhage within the abdomen or pelvis. 3. Surgical changes of a partial colon resection in  the posterior pelvis and LEFT abdominal wall ostomy creation. 4. Mildly distended gas-filled loops of small bowel within the RIGHT abdomen, likely ileus. 5. Thickening of the walls of the small bowel in the RIGHT abdomen and upper pelvis, likely reactive in nature. 6. Small RIGHT pleural effusion with compressive atelectasis. Aortic Atherosclerosis (ICD10-I70.0). Electronically Signed   By: Bary Richard M.D.   On: 09/10/2020 10:40   DG Chest Port 1 View  Result Date: 09/10/2020 CLINICAL DATA:  Fever. EXAM: PORTABLE CHEST 1 VIEW COMPARISON:  September 06, 2020. FINDINGS: The heart size and mediastinal contours are within normal limits. Hypoinflation of the lungs is noted. Mild bibasilar subsegmental atelectasis is noted. There is interval development of patchy opacity seen laterally in the left lung concerning for atypical inflammation. Probable small right pleural effusion. The visualized skeletal structures are unremarkable. IMPRESSION: Hypoinflation of the lungs is noted with mild bibasilar subsegmental atelectasis. Probable small right pleural effusion is noted. There is interval development of patchy opacities seen laterally in the left lung concerning for atypical inflammation. Electronically Signed   By: Lupita Raider M.D.   On: 09/10/2020 09:56    Procedures Procedures   Medications Ordered in ED Medications  piperacillin-tazobactam (ZOSYN) IVPB 3.375 g (has no administration in time range)  lactated ringers bolus 1,000 mL (1,000 mLs Intravenous New  Bag/Given 09/10/20 0926)  acetaminophen (TYLENOL) tablet 650 mg (650 mg Oral Given 09/10/20 1146)  iohexol (OMNIPAQUE) 350 MG/ML injection 100 mL (75 mLs Intravenous Contrast Given 09/10/20 1011)    ED Course  I have reviewed the triage vital signs and the nursing notes.  Pertinent labs & imaging results that were available during my care of the patient were reviewed by me and considered in my medical decision making (see chart for details).    MDM Rules/Calculators/A&P                          Pt is a 70 y.o. female who was recently discharged from the hospital after having a Hartmann procedure performed.  Patient was also diagnosed with COVID-19 while in the hospital.  She presents today due to abdominal pain, fevers, as well as abdominal distention.  Labs: CBC with a white count of 30.4, hemoglobin of 9.9, platelets of 424, neutrophils of 26.6, monocytes of 1.8, absolute immature granulocytes of 0.48. CMP with a sodium of 130, glucose of 111, calcium of 8.2, total protein of 5.4, albumin at 2.3. Lactic acid 1.1. APTT of 29. Prothrombin time 14.4 with an INR of 1.1.   Imaging: CT scan of the abdomen/pelvis with contrast showing IMPRESSION: 1. Circumscribed fluid collection within the RIGHT upper quadrant, underlying the RIGHT liver lobe and extending into the upper RIGHT paracolic gutter, worrisome for early developing abscess, measuring approximately 5 cm greatest dimension. Additional fluid is seen over the RIGHT liver lobe, possibly contiguous. 2. No additional circumscribed fluid collection or abscess-like collection is seen within the abdomen or pelvis. Expected small amount of free fluid scattered throughout the abdomen and pelvis. No evidence of acute hemorrhage within the abdomen or pelvis. 3. Surgical changes of a partial colon resection in the posterior pelvis and LEFT abdominal wall ostomy creation. 4. Mildly distended gas-filled loops of small bowel within the RIGHT abdomen,  likely ileus. 5. Thickening of the walls of the small bowel in the RIGHT abdomen and upper pelvis, likely reactive in nature. 6. Small RIGHT pleural effusion with compressive atelectasis. Aortic Atherosclerosis   I,  Placido SouLogan Kala Gassmann, PA-C, personally reviewed and evaluated these images and lab results as part of my medical decision-making.  Patient with a recent complicated admission and was discharged 2 days ago.  Came back today for worsening abdominal pain, fevers, as well as abdominal distention.  CT scan concerning for a developing hepatic abscess.  Patient discussed with general surgeon Dr. Claudine Moutonodenberg at [facility].  Given patient's recent surgery at [facility] with Dr. Derrell Lollingamirez he recommends that we consult their surgical team.  We will give patient a dose of Zosyn here in the emergency department.  Patient discussed with Dr. Janee Mornhompson with general surgery at [facility].  Recommends medicine admission and [facility].  They will consult on the patient.  We will discuss with the medicine team at this time.  Note: Portions of this report may have been transcribed using voice recognition software. Every effort was made to ensure accuracy; however, inadvertent computerized transcription errors may be present.   Final Clinical Impression(s) / ED Diagnoses Final diagnoses:  Hepatic abscess    Rx / DC Orders ED Discharge Orders     None        Placido SouJoldersma, Callie Bunyard, PA-C 09/10/20 1150    Franne FortsGray, Alicia P, DO 09/10/20 1502

## 2020-09-10 NOTE — ED Triage Notes (Signed)
Pt here for stomach pain, reports fever, pt took hydrocodone for pain, she finished her abx 2 days ago, pt had recent colon sx with an open incision, new ostomy.  Pt Friday from [facility].  Pt alert and oriented, skin warm and pale.

## 2020-09-10 NOTE — Progress Notes (Signed)
Patient arriived to 6 NORTH room 32 alert and oriented x4. Airborne precautions in place. Bed in lowest position. Call light in reach. Will continue to monitor pt. Awaiting new orders

## 2020-09-10 NOTE — H&P (Signed)
Patient Demographics:    Bess Saltzman, is a 70 y.o. female  MRN: [id number]   DOB - [birth year]  Admit Date - 09/10/2020  Outpatient Primary MD for the patient is [name], Buren Kos, MD   Assessment & Plan:    Principal Problem:   Sepsis Due to Intraabdomonal Abscess Active Problems:   Post-op intraabdominal/Liver abscess-Had Hartmann's with Colostomy on 08/29/2020 for Sigmoid Diverticulits   Hyponatremia   COVID-19 virus infection ---- +ve on 09/01/2020   Essential hypertension, benign   1)Sepsis secondary to intra-abdominal/liver abscess in the postop patient---POA -Patient was initially admitted on 08/23/2020 with sepsis secondary to sigmoid diverticulitis and AKI, she failed conservative treatment with antibiotics and developed perforation and abscess with peritonitis --required exploratory lap with Hartman's procedure and colostomy on 08/29/2020--she developed wound dehiscence of postop abdominal wound-- -Patient meets sepsis criteria--WBC of 30.4 K, patient with fevers, tachycardia and tachypnea, -Lactic acid is not elevated - Now with Liver/intrabdominal abscess--- IV Zosyn and IV fluids as ordered EDP D/w Dr Alric Seton recommended transfer to [facility] for further general surgery evaluation and possible IR involvement Defer to Gen Surgeon if IR needs to be involved -  2)Covid 19 Infection--- -During hospitalization she tested positive for COVID on 09/01/2020--initially was hypoxic, was treated with steroids and was weaned off O2 prior to discharge home - -Patient was discharged home with home health on 09/08/2020 -Repeat COVID test positive, -Continue COVID isolation given fevers and chest x-ray and CT abdomen findings of possible COVID-pneumonia -Currently without hypoxia and giving sepsis from intra-abdominal  infection we will avoid steroids at this time  3)Acute Anemia in the postop patient-- -Hemoglobin is 9.9-which is higher than recent baseline --No obvious bleeding at this time  -Monitor H&H closely and transfuse as clinically indicated  4)Persistent Hyponatremia--- suspect due to poor oral intake -sodium is 130, hydrate gently monitor sodium levels  5)Hypoalbuminemia-- FTT with  albumin of  2.3,  -Patient with recent prolonged hospitalization and surgical procedures resulting in poor oral intake overall----May need TPN  6)HTN--okay to restart Bystolic 20 mg daily, amlodipine 10 mg daily -Hold Avapro--due to poor oral intake, contrast study and risk of dehydration and AKI - may use IV Hydralazine 10 mg  Every 4 hours Prn for systolic blood pressure over 160 mmhg  Disposition/Need for in-Hospital Stay- patient unable to be discharged at this time due to --Sepsis secondary to intra-abdominal/liver abscess-requiring iv antibiotics and IVF --- pending  culture data  Status is: Inpatient  Remains inpatient appropriate because: Please see disposition above  Dispo: The patient is from: Home              Anticipated d/c is to:  Home with HH Vs SNF              Anticipated d/c date is: > 3 days              Patient currently is not medically stable to d/c.  Barriers: Not Clinically Stable-   With History of - Reviewed by me  Past Medical History:  Diagnosis Date   Essential hypertension, benign 10/01/2018   HLD (hyperlipidemia) 10/01/2018   Osteoarthritis 10/01/2018   Vitamin D deficiency disease 10/01/2018      Past Surgical History:  Procedure Laterality Date   ABDOMINAL SURGERY     COLON RESECTION N/A 08/29/2020   Procedure: COLON RESECTION;  Surgeon: Axel Filler, MD;  Location: [facility] OR;  Service: General;  Laterality: N/A;   COLON SURGERY     COLOSTOMY N/A 08/29/2020   Procedure: COLOSTOMY;  Surgeon: Axel Filler, MD;  Location: [facility] OR;  Service: General;  Laterality: N/A;    LAPAROTOMY N/A 08/29/2020   Procedure: EXPLORATORY LAPAROTOMY;  Surgeon: Axel Filler, MD;  Location: [facility] OR;  Service: General;  Laterality: N/A;   TONSILLECTOMY Bilateral    CC-- Fevers/chills      HPI:    Rainy Rothman  is a 70 y.o. female with past medical history relevant for HTN and persistent hyponatremia  who is  s/p ex lap and Hartman's procedure with colostomy by [facility] 08/29/20 for diverticulitis with abscess-- Now with Liver/intrabdominal abscess--- - Patient was initially admitted on 08/23/2020 with sepsis secondary to sigmoid diverticulitis and AKI, she failed conservative treatment with antibiotics and developed perforation and abscess with peritonitis --required exploratory lap with Hartman's procedure and colostomy on 08/29/2020--she developed wound dehiscence of postop abdominal wound-- -During hospitalization she tested positive for COVID on 09/01/2020--initially was hypoxic, was treated with steroids and was weaned off O2 prior to discharge home - -Patient was discharged home with home health on 09/08/2020 -On 09/09/2020 she developed fevers and chills -Presented to the ED on 09/10/2020 with persistent fevers and chills and finding of intra-abdominal/liver Abscess and WBC of 30.4 K -Lactic acid is not elevated  -Hemoglobin is 9.9-which is higher than recent baseline -Sodium is 130, creatinine 0.77, albumin is low at 2.3, LFTs are not elevated  CT Abd /Pelvis--- IMPRESSION: 1. Circumscribed fluid collection within the RIGHT upper quadrant, underlying the RIGHT liver lobe and extending into the upper RIGHT paracolic gutter, worrisome for early developing abscess, measuring approximately 5 cm greatest dimension. Additional fluid is seen over the RIGHT liver lobe, possibly contiguous. 2. No additional circumscribed fluid collection or abscess-like collection is seen within the abdomen or pelvis. Expected small amount of free fluid scattered throughout the abdomen and pelvis.  No evidence of acute hemorrhage within the abdomen or pelvis. 3. Surgical changes of a partial colon resection in the posterior pelvis and LEFT abdominal wall ostomy creation. 4. Mildly distended gas-filled loops of small bowel within the RIGHT abdomen, likely ileus. 5. Thickening of the walls of the small bowel in the RIGHT abdomen and upper pelvis, likely reactive in nature. 6. Small RIGHT pleural effusion with compressive atelectasis.  --EDP discussed case with general surgeon on-call at [facility] Dr. Janee Morn--- recommended transfer to [facility] for further general surgery evaluation and possible IR involvement  --Additional history obtained from patient's husband at bedside  No Nausea, Vomiting or Diarrhea -Denies increased abdominal pain   Review of systems:    In addition to the HPI above,   A full Review of  Systems was done, all other systems reviewed are negative except as noted above in HPI , .    Social History:  Reviewed by me    Social History   Tobacco Use   Smoking status: Never   Smokeless tobacco: Never  Substance Use Topics  Alcohol use: Yes    Alcohol/week: 7.0 standard drinks    Types: 7 Glasses of wine per week    Comment: Red wine     Family History :  Reviewed by me    Family History  Problem Relation Age of Onset   Hypertension Mother    Stroke Mother    Stroke Father    Hypertension Brother    Hypertension Son     Home Medications:   Prior to Admission medications   Medication Sig Start Date End Date Taking? Authorizing Provider  amLODipine (NORVASC) 10 MG tablet Take 1 tablet (10 mg total) by mouth daily. 03/30/20   Wilson Singer, MD  irbesartan (AVAPRO) 300 MG tablet Take 1 tablet (300 mg total) by mouth daily. 05/30/20   Wilson Singer, MD  Nebivolol HCl 20 MG TABS Take 1 tablet (20 mg total) by mouth daily at 12 noon. 05/30/20   Wilson Singer, MD  oxyCODONE (OXY IR/ROXICODONE) 5 MG immediate release tablet Take 1  tablet (5 mg total) by mouth every 6 (six) hours as needed for breakthrough pain. 09/08/20   Maczis, Elmer Sow, PA-C     Allergies:    No Known Allergies   Physical Exam:   Vitals  Blood pressure 139/76, pulse 100, temperature (!) 100.8 F (38.2 C), temperature source Rectal, resp. rate (!) 22, height 5\' 6"  (1.676 m), weight 69.5 kg, SpO2 95 %.  Physical Examination: General appearance - alert  and in no distress  Mental status - alert, oriented to person, place, and time,  Eyes - sclera anicteric Neck - supple, no JVD elevation , Chest -somewhat diminished breath sounds on the left, no wheezing  heart - S1 and S2 normal, regular ,tachycardic Abdomen - soft, nondistended, ostomy bag with scant fecal material, anterior abdominal wound dehiscence--without significant drainage no evidence of significant wound infection, slight discomfort without rebound or guarding on palpation Neurological - screening mental status exam normal, neck supple without rigidity, cranial nerves II through XII intact, DTR's normal and symmetric Extremities - no pedal edema noted, intact peripheral pulses  Skin - warm, dry     Data Review:    CBC Recent Labs  Lab 09/04/20 1034 09/05/20 0227 09/07/20 0330 09/10/20 0910  WBC 26.3* 20.6* 18.9* 30.4*  HGB 8.3* 9.0* 8.0* 9.9*  HCT 25.6* 26.7* 23.7* 29.7*  PLT 341 335 373 424*  MCV 103.6* 96.7 97.5 97.1  MCH 33.6 32.6 32.9 32.4  MCHC 32.4 33.7 33.8 33.3  RDW 14.8 14.1 14.1 14.2  LYMPHSABS  --   --   --  1.4  MONOABS  --   --   --  1.8*  EOSABS  --   --   --  0.1  BASOSABS  --   --   --  0.1   ------------------------------------------------------------------------------------------------------------------  Chemistries  Recent Labs  Lab 09/05/20 1104 09/07/20 0330 09/10/20 0910  NA 135 133* 130*  K 4.7 4.3 3.8  CL 101 101 100  CO2 25 28 23   GLUCOSE 239* 225* 111*  BUN 15 19 15   CREATININE 0.53 0.55 0.77  CALCIUM 8.1* 8.4* 8.2*  MG 1.5*  1.7  --   AST  --  21 32  ALT  --  27 37  ALKPHOS  --  52 65  BILITOT  --  0.4 1.2   ------------------------------------------------------------------------------------------------------------------ estimated creatinine clearance is 63 mL/min (by C-G formula based on SCr of 0.77 mg/dL).  Coagulation profile Recent Labs  Lab  09/10/20 0910  INR 1.1   ------------------------------------------------------------------------------------------------------------------- Recent Labs    09/08/20 0435  DDIMER 9.52*     Imaging Results:    CT ABDOMEN PELVIS W CONTRAST  Result Date: 09/10/2020 CLINICAL DATA:  Abdominal pain, fever. Recent surgery for diverticulitis with abscess. New onset fevers and abdominal pain this morning. EXAM: CT ABDOMEN AND PELVIS WITH CONTRAST TECHNIQUE: Multidetector CT imaging of the abdomen and pelvis was performed using the standard protocol following bolus administration of intravenous contrast. CONTRAST:  75mL OMNIPAQUE IOHEXOL 350 MG/ML SOLN COMPARISON:  CT abdomen dated 08/25/2020. FINDINGS: Lower chest: Small RIGHT pleural effusion with compressive atelectasis. Additional mild bibasilar scarring/atelectasis. Hepatobiliary: Hepatic cysts. Gallbladder is unremarkable. Nose significant bile duct dilatation appreciated. Pancreas: Unremarkable. No pancreatic ductal dilatation or surrounding inflammatory changes. Spleen: Normal in size without focal abnormality. Adrenals/Urinary Tract: Adrenal glands appear normal. Kidneys are unremarkable without suspicious mass, stone or hydronephrosis. Stomach/Bowel: Surgical changes of a partial colon resection in the posterior pelvis and LEFT abdominal wall ostomy creation. Mildly distended gas-filled loops of small bowel are seen within the RIGHT abdomen, likely ileus. Thickening of the walls of the small bowel in the RIGHT abdomen and upper pelvis is likely reactive in nature. Vascular/Lymphatic: Aortic atherosclerosis. No acute  appearing vascular abnormality. No enlarged lymph nodes are identified. Reproductive: Uterus and bilateral adnexa are unremarkable. Other: Small amount of free fluid scattered throughout the abdomen and pelvis. Slightly more circumscribed fluid collection within the RIGHT upper quadrant, underlying the RIGHT liver lobe and extending into the upper paracolic gutter, worrisome for early developing abscess, measuring approximately 5 cm greatest dimension (series 2, image 32; coronal series 5, images 51 through 59). Additional fluid is seen over the RIGHT liver lobe, possibly contiguous. No free intraperitoneal air. No evidence of acute hemorrhage within the abdomen or pelvis. Musculoskeletal: Degenerative spondylosis of the scoliotic lower lumbar spine, at least moderate in degree. No acute-appearing osseous abnormality. Surgical defect within the anterior abdominal wall, with associated packing. IMPRESSION: 1. Circumscribed fluid collection within the RIGHT upper quadrant, underlying the RIGHT liver lobe and extending into the upper RIGHT paracolic gutter, worrisome for early developing abscess, measuring approximately 5 cm greatest dimension. Additional fluid is seen over the RIGHT liver lobe, possibly contiguous. 2. No additional circumscribed fluid collection or abscess-like collection is seen within the abdomen or pelvis. Expected small amount of free fluid scattered throughout the abdomen and pelvis. No evidence of acute hemorrhage within the abdomen or pelvis. 3. Surgical changes of a partial colon resection in the posterior pelvis and LEFT abdominal wall ostomy creation. 4. Mildly distended gas-filled loops of small bowel within the RIGHT abdomen, likely ileus. 5. Thickening of the walls of the small bowel in the RIGHT abdomen and upper pelvis, likely reactive in nature. 6. Small RIGHT pleural effusion with compressive atelectasis. Aortic Atherosclerosis (ICD10-I70.0). Electronically Signed   By: Bary RichardStan  Maynard  M.D.   On: 09/10/2020 10:40   DG Chest Port 1 View  Result Date: 09/10/2020 CLINICAL DATA:  Fever. EXAM: PORTABLE CHEST 1 VIEW COMPARISON:  September 06, 2020. FINDINGS: The heart size and mediastinal contours are within normal limits. Hypoinflation of the lungs is noted. Mild bibasilar subsegmental atelectasis is noted. There is interval development of patchy opacity seen laterally in the left lung concerning for atypical inflammation. Probable small right pleural effusion. The visualized skeletal structures are unremarkable. IMPRESSION: Hypoinflation of the lungs is noted with mild bibasilar subsegmental atelectasis. Probable small right pleural effusion is noted. There is interval development  of patchy opacities seen laterally in the left lung concerning for atypical inflammation. Electronically Signed   By: Lupita Raider M.D.   On: 09/10/2020 09:56    Radiological Exams on Admission: CT ABDOMEN PELVIS W CONTRAST  Result Date: 09/10/2020 CLINICAL DATA:  Abdominal pain, fever. Recent surgery for diverticulitis with abscess. New onset fevers and abdominal pain this morning. EXAM: CT ABDOMEN AND PELVIS WITH CONTRAST TECHNIQUE: Multidetector CT imaging of the abdomen and pelvis was performed using the standard protocol following bolus administration of intravenous contrast. CONTRAST:  75mL OMNIPAQUE IOHEXOL 350 MG/ML SOLN COMPARISON:  CT abdomen dated 08/25/2020. FINDINGS: Lower chest: Small RIGHT pleural effusion with compressive atelectasis. Additional mild bibasilar scarring/atelectasis. Hepatobiliary: Hepatic cysts. Gallbladder is unremarkable. Nose significant bile duct dilatation appreciated. Pancreas: Unremarkable. No pancreatic ductal dilatation or surrounding inflammatory changes. Spleen: Normal in size without focal abnormality. Adrenals/Urinary Tract: Adrenal glands appear normal. Kidneys are unremarkable without suspicious mass, stone or hydronephrosis. Stomach/Bowel: Surgical changes of a partial  colon resection in the posterior pelvis and LEFT abdominal wall ostomy creation. Mildly distended gas-filled loops of small bowel are seen within the RIGHT abdomen, likely ileus. Thickening of the walls of the small bowel in the RIGHT abdomen and upper pelvis is likely reactive in nature. Vascular/Lymphatic: Aortic atherosclerosis. No acute appearing vascular abnormality. No enlarged lymph nodes are identified. Reproductive: Uterus and bilateral adnexa are unremarkable. Other: Small amount of free fluid scattered throughout the abdomen and pelvis. Slightly more circumscribed fluid collection within the RIGHT upper quadrant, underlying the RIGHT liver lobe and extending into the upper paracolic gutter, worrisome for early developing abscess, measuring approximately 5 cm greatest dimension (series 2, image 32; coronal series 5, images 51 through 59). Additional fluid is seen over the RIGHT liver lobe, possibly contiguous. No free intraperitoneal air. No evidence of acute hemorrhage within the abdomen or pelvis. Musculoskeletal: Degenerative spondylosis of the scoliotic lower lumbar spine, at least moderate in degree. No acute-appearing osseous abnormality. Surgical defect within the anterior abdominal wall, with associated packing. IMPRESSION: 1. Circumscribed fluid collection within the RIGHT upper quadrant, underlying the RIGHT liver lobe and extending into the upper RIGHT paracolic gutter, worrisome for early developing abscess, measuring approximately 5 cm greatest dimension. Additional fluid is seen over the RIGHT liver lobe, possibly contiguous. 2. No additional circumscribed fluid collection or abscess-like collection is seen within the abdomen or pelvis. Expected small amount of free fluid scattered throughout the abdomen and pelvis. No evidence of acute hemorrhage within the abdomen or pelvis. 3. Surgical changes of a partial colon resection in the posterior pelvis and LEFT abdominal wall ostomy creation. 4.  Mildly distended gas-filled loops of small bowel within the RIGHT abdomen, likely ileus. 5. Thickening of the walls of the small bowel in the RIGHT abdomen and upper pelvis, likely reactive in nature. 6. Small RIGHT pleural effusion with compressive atelectasis. Aortic Atherosclerosis (ICD10-I70.0). Electronically Signed   By: Bary Richard M.D.   On: 09/10/2020 10:40   DG Chest Port 1 View  Result Date: 09/10/2020 CLINICAL DATA:  Fever. EXAM: PORTABLE CHEST 1 VIEW COMPARISON:  September 06, 2020. FINDINGS: The heart size and mediastinal contours are within normal limits. Hypoinflation of the lungs is noted. Mild bibasilar subsegmental atelectasis is noted. There is interval development of patchy opacity seen laterally in the left lung concerning for atypical inflammation. Probable small right pleural effusion. The visualized skeletal structures are unremarkable. IMPRESSION: Hypoinflation of the lungs is noted with mild bibasilar subsegmental atelectasis. Probable small right  pleural effusion is noted. There is interval development of patchy opacities seen laterally in the left lung concerning for atypical inflammation. Electronically Signed   By: Lupita Raider M.D.   On: 09/10/2020 09:56    DVT Prophylaxis -SCD /Heparin AM Labs Ordered, also please review Full Orders  Family Communication: Admission, patients condition and plan of care including tests being ordered have been discussed with the patient and Husband at bedside who indicate understanding and agree with the plan   Code Status - Full Code  Likely DC to home with home health Versus SNF after resolution of sepsis pathophysiology  Condition   stable  Shon Hale M.D on 09/10/2020 at 1:29 PM Go to [email or web address] -  for contact info  [facility] - Office  [phone]

## 2020-09-11 DIAGNOSIS — A419 Sepsis, unspecified organism: Secondary | ICD-10-CM | POA: Diagnosis not present

## 2020-09-11 LAB — CBC
HCT: 24.1 % — ABNORMAL LOW (ref 36.0–46.0)
HCT: 23.1 % — ABNORMAL LOW (ref 36.0–46.0)
Hemoglobin: 7.7 g/dL — ABNORMAL LOW (ref 12.0–15.0)
Hemoglobin: 7.8 g/dL — ABNORMAL LOW (ref 12.0–15.0)
MCH: 31.4 pg (ref 26.0–34.0)
MCH: 32.4 pg (ref 26.0–34.0)
MCHC: 32 g/dL (ref 30.0–36.0)
MCHC: 33.8 g/dL (ref 30.0–36.0)
MCV: 98.4 fL (ref 80.0–100.0)
MCV: 95.9 fL (ref 80.0–100.0)
Platelets: 243 10*3/uL (ref 150–400)
Platelets: 217 10*3/uL (ref 150–400)
RBC: 2.45 MIL/uL — ABNORMAL LOW (ref 3.87–5.11)
RBC: 2.41 MIL/uL — ABNORMAL LOW (ref 3.87–5.11)
RDW: 13.9 % (ref 11.5–15.5)
RDW: 13.9 % (ref 11.5–15.5)
WBC: 18.5 10*3/uL — ABNORMAL HIGH (ref 4.0–10.5)
WBC: 18.3 10*3/uL — ABNORMAL HIGH (ref 4.0–10.5)
nRBC: 0 % (ref 0.0–0.2)
nRBC: 0 % (ref 0.0–0.2)

## 2020-09-11 LAB — COMPREHENSIVE METABOLIC PANEL
ALT: 33 U/L (ref 0–44)
AST: 29 U/L (ref 15–41)
Albumin: 1.8 g/dL — ABNORMAL LOW (ref 3.5–5.0)
Alkaline Phosphatase: 58 U/L (ref 38–126)
Anion gap: 8 (ref 5–15)
BUN: 10 mg/dL (ref 8–23)
CO2: 22 mmol/L (ref 22–32)
Calcium: 8.1 mg/dL — ABNORMAL LOW (ref 8.9–10.3)
Chloride: 101 mmol/L (ref 98–111)
Creatinine, Ser: 0.77 mg/dL (ref 0.44–1.00)
GFR, Estimated: >60 mL/min (ref >60)
Glucose, Bld: 77 mg/dL (ref 70–99)
Potassium: 3.3 mmol/L — ABNORMAL LOW (ref 3.5–5.1)
Sodium: 131 mmol/L — ABNORMAL LOW (ref 135–145)
Total Bilirubin: 1.5 mg/dL — ABNORMAL HIGH (ref 0.3–1.2)
Total Protein: 4.6 g/dL — ABNORMAL LOW (ref 6.5–8.1)

## 2020-09-11 LAB — GLUCOSE, CAPILLARY
Glucose-Capillary: 57 mg/dL — ABNORMAL LOW (ref 70–99)
Glucose-Capillary: 89 mg/dL (ref 70–99)
Glucose-Capillary: 56 mg/dL — ABNORMAL LOW (ref 70–99)
Glucose-Capillary: 63 mg/dL — ABNORMAL LOW (ref 70–99)
Glucose-Capillary: 119 mg/dL — ABNORMAL HIGH (ref 70–99)

## 2020-09-11 LAB — BASIC METABOLIC PANEL
Anion gap: 11 (ref 5–15)
BUN: 9 mg/dL (ref 8–23)
CO2: 21 mmol/L — ABNORMAL LOW (ref 22–32)
Calcium: 8.1 mg/dL — ABNORMAL LOW (ref 8.9–10.3)
Chloride: 100 mmol/L (ref 98–111)
Creatinine, Ser: 0.84 mg/dL (ref 0.44–1.00)
GFR, Estimated: >60 mL/min (ref >60)
Glucose, Bld: 57 mg/dL — ABNORMAL LOW (ref 70–99)
Potassium: 3.6 mmol/L (ref 3.5–5.1)
Sodium: 132 mmol/L — ABNORMAL LOW (ref 135–145)

## 2020-09-11 MED ORDER — DEXTROSE 50 % IV SOLN
12.5000 g | INTRAVENOUS | Status: AC
  Administered 2020-09-11: 12.5 g via INTRAVENOUS

## 2020-09-11 MED ORDER — DEXTROSE 10 % IV SOLN: INTRAVENOUS | Status: DC

## 2020-09-11 MED ORDER — DEXTROSE 50 % IV SOLN
12.5000 g | INTRAVENOUS | Status: DC | PRN
  Filled 2020-09-11: qty 50

## 2020-09-11 NOTE — Consult Note (Signed)
Reason for Consult:postop abscess Referring Physician: Dr. Acquanetta Belling Ferrante is an 70 y.o. female.  HPI: This is a 70 year old female who is known to our service status post an emergent exploratory laparotomy with partial colectomy and end colostomy for perforated diverticulitis performed by Dr. Derrell Lolling on 8/16.  During that hospitalization she tested positive for COVID.  She was discharged home on 8/26 doing well.  She has been undergoing wet-to-dry dressing changes.  On 8/27 she started developing fevers and chills.  She reports no abdominal pain no shortness of breath.  She underwent a CT scan of the abdomen and pelvis which showed findings worrisome for possible abscess in the right upper quadrant of the abdomen.  She also had a pleural effusion and atelectasis.  At that time her white blood count was 30.4. This morning her white blood count is pending.  She is now on IV antibiotics.  She reports no abdominal pain.  She reports her ostomy has been working well.  Past Medical History:  Diagnosis Date   Essential hypertension, benign 10/01/2018   HLD (hyperlipidemia) 10/01/2018   Osteoarthritis 10/01/2018   Vitamin D deficiency disease 10/01/2018    Past Surgical History:  Procedure Laterality Date   ABDOMINAL SURGERY     COLON RESECTION N/A 08/29/2020   Procedure: COLON RESECTION;  Surgeon: Axel Filler, MD;  Location: [facility] OR;  Service: General;  Laterality: N/A;   COLON SURGERY     COLOSTOMY N/A 08/29/2020   Procedure: COLOSTOMY;  Surgeon: Axel Filler, MD;  Location: [facility] OR;  Service: General;  Laterality: N/A;   LAPAROTOMY N/A 08/29/2020   Procedure: EXPLORATORY LAPAROTOMY;  Surgeon: Axel Filler, MD;  Location[facility] OR;  Service: General;  Laterality: N/A;   TONSILLECTOMY Bilateral     Family History  Problem Relation Age of Onset   Hypertension Mother    Stroke Mother    Stroke Father    Hypertension Brother    Hypertension Son     Social History:  reports that  she has never smoked. She has never used smokeless tobacco. She reports current alcohol use of about 7.0 standard drinks per week. She reports that she does not use drugs.  Allergies: No Known Allergies  Medications: I have reviewed the patient's current medications.  Results for orders placed or performed during the hospital encounter of 09/10/20 (from the past 48 hour(s))  Lactic acid, plasma     Status: None   Collection Time: 09/10/20  9:10 AM  Result Value Ref Range   Lactic Acid, Venous 1.1 0.5 - 1.9 mmol/L    Comment: Performed at [facility], [address], [place], [place] [address]  Comprehensive metabolic panel     Status: Abnormal   Collection Time: 09/10/20  9:10 AM  Result Value Ref Range   Sodium 130 (L) 135 - 145 mmol/L   Potassium 3.8 3.5 - 5.1 mmol/L   Chloride 100 98 - 111 mmol/L   CO2 23 22 - 32 mmol/L   Glucose, Bld 111 (H) 70 - 99 mg/dL    Comment: Glucose reference range applies only to samples taken after fasting for at least 8 hours.   BUN 15 8 - 23 mg/dL   Creatinine, Ser 1.97 0.44 - 1.00 mg/dL   Calcium 8.2 (L) 8.9 - 10.3 mg/dL   Total Protein 5.4 (L) 6.5 - 8.1 g/dL   Albumin 2.3 (L) 3.5 - 5.0 g/dL   AST 32 15 - 41 U/L   ALT 37 0 -  44 U/L   Alkaline Phosphatase 65 38 - 126 U/L   Total Bilirubin 1.2 0.3 - 1.2 mg/dL   GFR, Estimated >29 >52 mL/min    Comment: (NOTE) Calculated using the CKD-EPI Creatinine Equation (2021)    Anion gap 7 5 - 15    Comment: Performed at [facility], [address] [place], [place] [address]  CBC WITH DIFFERENTIAL     Status: Abnormal   Collection Time: 09/10/20  9:10 AM  Result Value Ref Range   WBC 30.4 (H) 4.0 - 10.5 K/uL   RBC 3.06 (L) 3.87 - 5.11 MIL/uL   Hemoglobin 9.9 (L) 12.0 - 15.0 g/dL   HCT 44.0 (L) 10.2 - 72.5 %   MCV 97.1 80.0 - 100.0 fL   MCH 32.4 26.0 - 34.0 pg   MCHC 33.3 30.0 - 36.0 g/dL   RDW 36.6 44.0 - 34.7 %   Platelets 424 (H) 150 - 400 K/uL   nRBC 0.0 0.0 - 0.2 %   Neutrophils Relative %  87 %   Neutro Abs 26.6 (H) 1.7 - 7.7 K/uL   Lymphocytes Relative 5 %   Lymphs Abs 1.4 0.7 - 4.0 K/uL   Monocytes Relative 6 %   Monocytes Absolute 1.8 (H) 0.1 - 1.0 K/uL   Eosinophils Relative 0 %   Eosinophils Absolute 0.1 0.0 - 0.5 K/uL   Basophils Relative 0 %   Basophils Absolute 0.1 0.0 - 0.1 K/uL   Immature Granulocytes 2 %   Abs Immature Granulocytes 0.48 (H) 0.00 - 0.07 K/uL    Comment: Performed at [facility], [address], [place], [place] [address]  Protime-INR     Status: None   Collection Time: 09/10/20  9:10 AM  Result Value Ref Range   Prothrombin Time 14.4 11.4 - 15.2 seconds   INR 1.1 0.8 - 1.2    Comment: (NOTE) INR goal varies based on device and disease states. Performed at [facility], [address], [place], [place] [address]   APTT     Status: None   Collection Time: 09/10/20  9:10 AM  Result Value Ref Range   aPTT 29 24 - 36 seconds    Comment: Performed at [facility], [address], [place], [place] [address]  Blood Culture (routine x 2)     Status: None (Preliminary result)   Collection Time: 09/10/20  9:33 AM   Specimen: Peripheral; Blood  Result Value Ref Range   Specimen Description BLOOD LEFT ARM BOTTLES DRAWN AEROBIC ONLY    Special Requests      Blood Culture adequate volume Performed at [facility], [address] [place], [place] [address]    Culture PENDING    Report Status PENDING   Lactic acid, plasma     Status: None   Collection Time: 09/10/20 11:38 AM  Result Value Ref Range   Lactic Acid, Venous 1.3 0.5 - 1.9 mmol/L    Comment: Performed at [facility], [address], [place], [place] [address]  Resp Panel by RT-PCR (Flu A&B, Covid) Nasopharyngeal Swab     Status: Abnormal   Collection Time: 09/10/20 11:43 AM   Specimen: Nasopharyngeal Swab; Nasopharyngeal(NP) swabs in vial transport medium  Result Value Ref Range   SARS Coronavirus 2 by RT PCR POSITIVE (A) NEGATIVE    Comment: CRITICAL RESULT CALLED TO, READ BACK BY AND  VERIFIED WITH: DAVIS,N [id number] 09/10/2020 COLEMAN,R (NOTE) SARS-CoV-2 target nucleic acids are DETECTED.  The SARS-CoV-2 RNA is generally detectable in  upper respiratory specimens during the acute phase of infection. Positive results are indicative of the presence of the identified virus, but do not rule out bacterial infection or co-infection with other pathogens not detected by the test. Clinical correlation with patient history and other diagnostic information is necessary to determine patient infection status. The expected result is Negative.  Fact Sheet for Patients: BloggerCourse.com  Fact Sheet for Healthcare Providers: SeriousBroker.it  This test is not yet approved or cleared by the [place] FDA and  has been authorized for detection and/or diagnosis of SARS-CoV-2 by FDA under an Emergency Use Authorization (EUA).  This EUA will remain in effect (meaning this te st can be used) for the duration of  the COVID-19 declaration under Section 564(b)(1) of the Act, 21 U.S.C. section 360bbb-3(b)(1), unless the authorization is terminated or revoked sooner.     Influenza A by PCR NEGATIVE NEGATIVE   Influenza B by PCR NEGATIVE NEGATIVE    Comment: (NOTE) The Xpert Xpress SARS-CoV-2/FLU/RSV plus assay is intended as an aid in the diagnosis of influenza from Nasopharyngeal swab specimens and should not be used as a sole basis for treatment. Nasal washings and aspirates are unacceptable for Xpert Xpress SARS-CoV-2/FLU/RSV testing.  Fact Sheet for Patients: BloggerCourse.com  Fact Sheet for Healthcare Providers: SeriousBroker.it  This test is not yet approved or cleared by the [place] FDA and has been authorized for detection and/or diagnosis of SARS-CoV-2 by FDA under an Emergency Use Authorization (EUA). This EUA will remain in effect (meaning this test can be used)  for the duration of the COVID-19 declaration under Section 564(b)(1) of the Act, 21 U.S.C. section 360bbb-3(b)(1), unless the authorization is terminated or revoked.  Performed at [facility], [address], [place], [place] [address]   Urinalysis, Routine w reflex microscopic Urine, Clean Catch     Status: None   Collection Time: 09/10/20  3:03 PM  Result Value Ref Range   Color, Urine YELLOW YELLOW   APPearance CLEAR CLEAR   Specific Gravity, Urine 1.025 1.005 - 1.030   pH 7.0 5.0 - 8.0   Glucose, UA NEGATIVE NEGATIVE mg/dL   Hgb urine dipstick NEGATIVE NEGATIVE   Bilirubin Urine NEGATIVE NEGATIVE   Ketones, ur NEGATIVE NEGATIVE mg/dL   Protein, ur NEGATIVE NEGATIVE mg/dL   Nitrite NEGATIVE NEGATIVE   Leukocytes,Ua NEGATIVE NEGATIVE    Comment: Performed at [facility], [address], [place], [place] [address]  Basic metabolic panel     Status: Abnormal   Collection Time: 09/11/20  6:48 AM  Result Value Ref Range   Sodium 132 (L) 135 - 145 mmol/L   Potassium 3.6 3.5 - 5.1 mmol/L   Chloride 100 98 - 111 mmol/L   CO2 21 (L) 22 - 32 mmol/L   Glucose, Bld 57 (L) 70 - 99 mg/dL    Comment: Glucose reference range applies only to samples taken after fasting for at least 8 hours.   BUN 9 8 - 23 mg/dL   Creatinine, Ser 7.16 0.44 - 1.00 mg/dL   Calcium 8.1 (L) 8.9 - 10.3 mg/dL   GFR, Estimated >96 >78 mL/min    Comment: (NOTE) Calculated using the CKD-EPI Creatinine Equation (2021)    Anion gap 11 5 - 15    Comment: Performed at [facility], [address], [place], [place] [address]    CT ABDOMEN PELVIS W CONTRAST  Result Date: 09/10/2020 CLINICAL DATA:  Abdominal pain, fever. Recent surgery for diverticulitis with abscess. New  onset fevers and abdominal pain this morning. EXAM: CT ABDOMEN AND PELVIS WITH CONTRAST TECHNIQUE: Multidetector CT imaging of the abdomen and pelvis was performed using the standard protocol following bolus administration of intravenous  contrast. CONTRAST:  75mL OMNIPAQUE IOHEXOL 350 MG/ML SOLN COMPARISON:  CT abdomen dated 08/25/2020. FINDINGS: Lower chest: Small RIGHT pleural effusion with compressive atelectasis. Additional mild bibasilar scarring/atelectasis. Hepatobiliary: Hepatic cysts. Gallbladder is unremarkable. Nose significant bile duct dilatation appreciated. Pancreas: Unremarkable. No pancreatic ductal dilatation or surrounding inflammatory changes. Spleen: Normal in size without focal abnormality. Adrenals/Urinary Tract: Adrenal glands appear normal. Kidneys are unremarkable without suspicious mass, stone or hydronephrosis. Stomach/Bowel: Surgical changes of a partial colon resection in the posterior pelvis and LEFT abdominal wall ostomy creation. Mildly distended gas-filled loops of small bowel are seen within the RIGHT abdomen, likely ileus. Thickening of the walls of the small bowel in the RIGHT abdomen and upper pelvis is likely reactive in nature. Vascular/Lymphatic: Aortic atherosclerosis. No acute appearing vascular abnormality. No enlarged lymph nodes are identified. Reproductive: Uterus and bilateral adnexa are unremarkable. Other: Small amount of free fluid scattered throughout the abdomen and pelvis. Slightly more circumscribed fluid collection within the RIGHT upper quadrant, underlying the RIGHT liver lobe and extending into the upper paracolic gutter, worrisome for early developing abscess, measuring approximately 5 cm greatest dimension (series 2, image 32; coronal series 5, images 51 through 59). Additional fluid is seen over the RIGHT liver lobe, possibly contiguous. No free intraperitoneal air. No evidence of acute hemorrhage within the abdomen or pelvis. Musculoskeletal: Degenerative spondylosis of the scoliotic lower lumbar spine, at least moderate in degree. No acute-appearing osseous abnormality. Surgical defect within the anterior abdominal wall, with associated packing. IMPRESSION: 1. Circumscribed fluid  collection within the RIGHT upper quadrant, underlying the RIGHT liver lobe and extending into the upper RIGHT paracolic gutter, worrisome for early developing abscess, measuring approximately 5 cm greatest dimension. Additional fluid is seen over the RIGHT liver lobe, possibly contiguous. 2. No additional circumscribed fluid collection or abscess-like collection is seen within the abdomen or pelvis. Expected small amount of free fluid scattered throughout the abdomen and pelvis. No evidence of acute hemorrhage within the abdomen or pelvis. 3. Surgical changes of a partial colon resection in the posterior pelvis and LEFT abdominal wall ostomy creation. 4. Mildly distended gas-filled loops of small bowel within the RIGHT abdomen, likely ileus. 5. Thickening of the walls of the small bowel in the RIGHT abdomen and upper pelvis, likely reactive in nature. 6. Small RIGHT pleural effusion with compressive atelectasis. Aortic Atherosclerosis (ICD10-I70.0). Electronically Signed   By: Bary Richard M.D.   On: 09/10/2020 10:40   DG Chest Port 1 View  Result Date: 09/10/2020 CLINICAL DATA:  Fever. EXAM: PORTABLE CHEST 1 VIEW COMPARISON:  September 06, 2020. FINDINGS: The heart size and mediastinal contours are within normal limits. Hypoinflation of the lungs is noted. Mild bibasilar subsegmental atelectasis is noted. There is interval development of patchy opacity seen laterally in the left lung concerning for atypical inflammation. Probable small right pleural effusion. The visualized skeletal structures are unremarkable. IMPRESSION: Hypoinflation of the lungs is noted with mild bibasilar subsegmental atelectasis. Probable small right pleural effusion is noted. There is interval development of patchy opacities seen laterally in the left lung concerning for atypical inflammation. Electronically Signed   By: Lupita Raider M.D.   On: 09/10/2020 09:56    Review of Systems  All other systems reviewed and are  negative. Blood pressure 114/66, pulse 86, temperature  98.7 F (37.1 C), temperature source Oral, resp. rate 17, height 5\' 6"  (1.676 m), weight 69.5 kg, SpO2 95 %. Physical Exam Constitutional:      General: She is not in acute distress.    Appearance: Normal appearance. She is not ill-appearing or diaphoretic.  HENT:     Head: Normocephalic and atraumatic.  Eyes:     Pupils: Pupils are equal, round, and reactive to light.  Cardiovascular:     Rate and Rhythm: Normal rate and regular rhythm.     Pulses: Normal pulses.  Pulmonary:     Effort: Pulmonary effort is normal. No respiratory distress.     Breath sounds: No wheezing.  Abdominal:     General: Abdomen is flat.     Comments: Her abdomen is currently soft and nontender.  Her ostomy is working well and is pink and well perfused.  Her open midline wound is clean  Musculoskeletal:        General: Normal range of motion.     Cervical back: Normal range of motion.  Skin:    General: Skin is warm and dry.  Neurological:     General: No focal deficit present.     Mental Status: She is alert.  Psychiatric:        Behavior: Behavior normal.        Judgment: Judgment normal.    Assessment/Plan: Status post Hartman's procedure for perforated diverticulitis now with possible intra-abdominal abscess.  She is currently on IV antibiotics.  We will consult interventional radiology for their opinion regarding the fluid collection and whether or not she needs aspiration versus IR placed drain.  She will remain n.p.o. until she has been evaluated by IR.  After that, she may resume a normal diet.  We will continue the wet-to-dry dressing changes to her midline wound.  We will follow her closely with you.  Abigail MiyamotoDouglas Quinnton Bury 09/11/2020, 8:46 AM

## 2020-09-11 NOTE — Plan of Care (Signed)
  Problem: Nutrition: Goal: Adequate nutrition will be maintained Outcome: Progressing   Problem: Pain Managment: Goal: General experience of comfort will improve Outcome: Progressing   

## 2020-09-11 NOTE — Progress Notes (Signed)
   Perforated diverticulum surgery 08/29/20 with Dr Derrell Lolling:  Hartmans surgery Returned to ED 8/27 with abd pain and fever  CT 8/28: IMPRESSION: 1. Circumscribed fluid collection within the RIGHT upper quadrant, underlying the RIGHT liver lobe and extending into the upper RIGHT paracolic gutter, worrisome for early developing abscess, measuring approximately 5 cm greatest dimension. Additional fluid is seen over the RIGHT liver lobe, possibly contiguous. 2. No additional circumscribed fluid collection or abscess-like collection is seen within the abdomen or pelvis. Expected small amount of free fluid scattered throughout the abdomen and pelvis. No evidence of acute hemorrhage within the abdomen or pelvis. 3. Surgical changes of a partial colon resection in the posterior pelvis and LEFT abdominal wall ostomy creation. 4. Mildly distended gas-filled loops of small bowel within the RIGHT abdomen, likely ileus. 5. Thickening of the walls of the small bowel in the RIGHT abdomen and upper pelvis, likely reactive in nature. 6. Small RIGHT pleural effusion with compressive atelectasis.  Request made for IR to aspirate/place drain  Dr Lowella Dandy has reviewed imaging.  Rec:  small abscesses noted All too small for IR to aspirate/drain at this time. Follow --- if repeat CT reveals enlarging collection(s)-- would reconsider at that time.  Will inform Dr Magnus Ivan

## 2020-09-11 NOTE — Progress Notes (Signed)
PROGRESS NOTE   Anne Aguirre  GPQ:[id number]    DOB: Feb 06, 1950    DOA: 09/10/2020  PCP: de [place], [name], MD   I have briefly reviewed patients previous medical records in [facility].  Chief complaint Postop abscess   Brief Narrative:  70 year old married female with medical history significant for HTN, HLD, persistent hyponatremia, emergent exploratory laparotomy with partial colectomy and end colostomy for perforated diverticulitis on 8/16, tested positive for COVID during that hospital admission, discharged home on 8/26 and doing well, started noticing fever and chills up to 201 F on 8/27 without abdominal pain but did start noticing dyspnea.  CT abdomen and pelvis showed findings worrisome for possible abscess in the right upper quadrant.  WBC 30.4.  She was transferred from [facility] to [facility] for surgical evaluation.  Admitted for possible intra-abdominal abscess s/p Hartman's procedure for perforated diverticulitis.  CCS consulted IR who recommended that the abscesses are all too small for IR to aspirate/drain at this time.   Assessment & Plan:  Principal Problem:   Sepsis Due to Intraabdomonal Abscess Active Problems:   Essential hypertension, benign   Hyponatremia   COVID-19 virus infection ---- +ve on 09/01/2020   Post-op intraabdominal/Liver abscess-Had Hartmann's with Colostomy on 08/29/2020 for Sigmoid Diverticulits   Sepsis POA secondary to intra-abdominal abscesses s/p Hartman's procedure for perforated diverticulitis: Met sepsis criteria on admission including fever, tachycardia, tachypnea and WBC of 30.4.  General surgery input appreciated.  Continue IV antibiotics/Zosyn.  As per IR, abscesses are too small to aspirate or drain at this time.  Regular diet resumed.  Monitor fever curve and WBC.  Blood cultures x2: Negative to date.  Urine culture pending.  Spiked fever of 102.7 F on 8/28  Recent COVID-19 infection: Tested positive for COVID on 09/01/2020.   Initially was hypoxic and treated with steroids and weaned off oxygen prior to discharge home.  Repeat SARS coronavirus 2 RT-PCR positive.  Continue isolation precautions.  Chest x-ray and CT abdomen findings of possible COVID-pneumonia.  No hypoxia.  Avoiding steroids due to sepsis and intra-abdominal abscess.  Appears stable from the standpoint.  Anemia, multifactorial: Initially may have been due to acute blood loss.  Hemoglobin ranging in the 8-9 range at time of discharge.  Now in the mid 7 range.  Follow CBC daily and transfuse if hemoglobin 7 g or less.  Hyponatremia: Stable.  Hypoglycemia: Appears to be recurrent.  May be related to poor oral intake.  We will start low-dose D10 infusion.  Monitor CBGs closely and treat per hypoglycemia protocol.  Essential hypertension: Controlled on home meds.  Body mass index is 24.74 kg/m.    DVT prophylaxis: heparin injection 5,000 Units Start: 09/11/20 2200 SCDs Start: 09/10/20 1323 Place TED hose Start: 09/10/20 1323     Code Status: Full Code Family Communication: I discussed in detail with patient spouse at bedside, updated care and answered questions. Disposition:  Status is: Inpatient  Remains inpatient appropriate because:Inpatient level of care appropriate due to severity of illness  Dispo: The patient is from: Home              Anticipated d/c is to: Home              Patient currently is not medically stable to d/c.   Difficult to place patient No        Consultants:   General surgery Interventional radiology  Procedures:   None  Antimicrobials:    Anti-infectives (From admission,  onward)    Start     Dose/Rate Route Frequency Ordered Stop   09/10/20 1130  piperacillin-tazobactam (ZOSYN) IVPB 3.375 g        3.375 g 12.5 mL/hr over 240 Minutes Intravenous Every 8 hours 09/10/20 1127           Subjective:  Overall feels better.  Temperatures down.  Denies cough or dyspnea.  Not much abdominal pain.  Spouse  at bedside for  Objective:   Vitals:   09/11/20 0439 09/11/20 0817 09/11/20 1135 09/11/20 1624  BP: 125/70 114/66 130/72 127/74  Pulse: 86 86 88 94  Resp: _0 Temp: 98.9 F (37.2 C) 98.7 F (37.1 C) 98.8 F (37.1 C) 98.9 F (37.2 C)  TempSrc: Oral Oral Oral Oral  SpO2: 93% 95% 97% 93%  Weight:      Height:        General exam: Middle-age female, moderately built and nourished lying comfortably propped up in bed without distress.  Does not look septic or toxic. Respiratory system: Clear to auscultation. Respiratory effort normal. Cardiovascular system: S1 & S2 heard, RRR. No JVD, murmurs, rubs, gallops or clicks. No pedal edema.  Telemetry personally reviewed: Sinus rhythm. Gastrointestinal system: Abdomen is nondistended, soft and nontender. No organomegaly or masses felt. Normal bowel sounds heard.  Left-sided ostomy intact with soft brown stools.  Right sided surgical site dressing clean and dry. Central nervous system: Alert and oriented. No focal neurological deficits. Extremities: Symmetric 5 x 5 power. Skin: No rashes, lesions or ulcers Psychiatry: Judgement and insight appear normal. Mood & affect appropriate.     Data Reviewed:   I have personally reviewed following labs and imaging studies   CBC: Recent Labs  Lab 09/10/20 0910 09/11/20 0024 09/11/20 0933  WBC 30.4* 18.5* 18.3*  NEUTROABS 26.6*  --   --   HGB 9.9* 7.7* 7.8*  HCT 29.7* 24.1* 23.1*  MCV 97.1 98.4 95.9  PLT 424* 243 568    Basic Metabolic Panel: Recent Labs  Lab 09/05/20 1104 09/07/20 0330 09/10/20 0910 09/11/20 0024 09/11/20 0648  NA 135 133* 130* 131* 132*  K 4.7 4.3 3.8 3.3* 3.6  CL 101 101 100 101 100  CO2 _1 21*  GLUCOSE 239* 225* 111* 77 57*  BUN _2 CREATININE 0.53 0.55 0.77 0.77 0.84  CALCIUM 8.1* 8.4* 8.2* 8.1* 8.1*  MG 1.5* 1.7  --   --   --   PHOS 3.3 4.2  --   --   --     Liver Function Tests: Recent Labs  Lab 09/07/20 0330  09/10/20 0910 09/11/20 0024  AST 21 32 29  ALT 27 37 33  ALKPHOS 52 65 58  BILITOT 0.4 1.2 1.5*  PROT 4.7* 5.4* 4.6*  ALBUMIN 1.9* 2.3* 1.8*    CBG: Recent Labs  Lab 09/11/20 1131 09/11/20 1621 09/11/20 1715  GLUCAP 89 56* 37*    Microbiology Studies:   Recent Results (from the past 240 hour(s))  Blood Culture (routine x 2)     Status: None (Preliminary result)   Collection Time: 09/10/20  9:10 AM   Specimen: BLOOD RIGHT FOREARM  Result Value Ref Range Status   Specimen Description   Final    BLOOD RIGHT FOREARM BOTTLES DRAWN AEROBIC AND ANAEROBIC Blood Culture adequate volume   Special Requests NONE  Final   Culture   Final    NO GROWTH < 24 HOURS Performed  at [facility], [address], [place], [place] [address]    Report Status PENDING  Incomplete  Blood Culture (routine x 2)     Status: None (Preliminary result)   Collection Time: 09/10/20  9:33 AM   Specimen: BLOOD LEFT ARM  Result Value Ref Range Status   Specimen Description BLOOD LEFT ARM BOTTLES DRAWN AEROBIC ONLY  Final   Special Requests Blood Culture adequate volume  Final   Culture   Final    NO GROWTH < 24 HOURS Performed at [facility], [address] [place], [place] [address]    Report Status PENDING  Incomplete  Resp Panel by RT-PCR (Flu A&B, Covid) Nasopharyngeal Swab     Status: Abnormal   Collection Time: 09/10/20 11:43 AM   Specimen: Nasopharyngeal Swab; Nasopharyngeal(NP) swabs in vial transport medium  Result Value Ref Range Status   SARS Coronavirus 2 by RT PCR POSITIVE (A) NEGATIVE Final    Comment: CRITICAL RESULT CALLED TO, READ BACK BY AND VERIFIED WITH: DAVIS,N 1311 09/10/2020 COLEMAN,R (NOTE) SARS-CoV-2 target nucleic acids are DETECTED.  The SARS-CoV-2 RNA is generally detectable in upper respiratory specimens during the acute phase of infection. Positive results are indicative of the presence of the identified virus, but do not rule out bacterial infection or co-infection  with other pathogens not detected by the test. Clinical correlation with patient history and other diagnostic information is necessary to determine patient infection status. The expected result is Negative.  Fact Sheet for Patients: EntrepreneurPulse.com.au  Fact Sheet for Healthcare Providers: IncredibleEmployment.be  This test is not yet approved or cleared by the [place] FDA and  has been authorized for detection and/or diagnosis of SARS-CoV-2 by FDA under an Emergency Use Authorization (EUA).  This EUA will remain in effect (meaning this te st can be used) for the duration of  the COVID-19 declaration under Section 564(b)(1) of the Act, 21 U.S.C. section 360bbb-3(b)(1), unless the authorization is terminated or revoked sooner.     Influenza A by PCR NEGATIVE NEGATIVE Final   Influenza B by PCR NEGATIVE NEGATIVE Final    Comment: (NOTE) The Xpert Xpress SARS-CoV-2/FLU/RSV plus assay is intended as an aid in the diagnosis of influenza from Nasopharyngeal swab specimens and should not be used as a sole basis for treatment. Nasal washings and aspirates are unacceptable for Xpert Xpress SARS-CoV-2/FLU/RSV testing.  Fact Sheet for Patients: EntrepreneurPulse.com.au  Fact Sheet for Healthcare Providers: IncredibleEmployment.be  This test is not yet approved or cleared by the [place] FDA and has been authorized for detection and/or diagnosis of SARS-CoV-2 by FDA under an Emergency Use Authorization (EUA). This EUA will remain in effect (meaning this test can be used) for the duration of the COVID-19 declaration under Section 564(b)(1) of the Act, 21 U.S.C. section 360bbb-3(b)(1), unless the authorization is terminated or revoked.  Performed at [facility], [address], [place], [place] [address]      Radiology Studies:  CT ABDOMEN PELVIS W CONTRAST  Result Date: 09/10/2020 CLINICAL  DATA:  Abdominal pain, fever. Recent surgery for diverticulitis with abscess. New onset fevers and abdominal pain this morning. EXAM: CT ABDOMEN AND PELVIS WITH CONTRAST TECHNIQUE: Multidetector CT imaging of the abdomen and pelvis was performed using the standard protocol following bolus administration of intravenous contrast. CONTRAST:  6m OMNIPAQUE IOHEXOL 350 MG/ML SOLN COMPARISON:  CT abdomen dated 08/25/2020. FINDINGS: Lower chest: Small RIGHT pleural effusion with compressive atelectasis. Additional mild bibasilar scarring/atelectasis. Hepatobiliary: Hepatic cysts. Gallbladder is unremarkable. Nose significant  bile duct dilatation appreciated. Pancreas: Unremarkable. No pancreatic ductal dilatation or surrounding inflammatory changes. Spleen: Normal in size without focal abnormality. Adrenals/Urinary Tract: Adrenal glands appear normal. Kidneys are unremarkable without suspicious mass, stone or hydronephrosis. Stomach/Bowel: Surgical changes of a partial colon resection in the posterior pelvis and LEFT abdominal wall ostomy creation. Mildly distended gas-filled loops of small bowel are seen within the RIGHT abdomen, likely ileus. Thickening of the walls of the small bowel in the RIGHT abdomen and upper pelvis is likely reactive in nature. Vascular/Lymphatic: Aortic atherosclerosis. No acute appearing vascular abnormality. No enlarged lymph nodes are identified. Reproductive: Uterus and bilateral adnexa are unremarkable. Other: Small amount of free fluid scattered throughout the abdomen and pelvis. Slightly more circumscribed fluid collection within the RIGHT upper quadrant, underlying the RIGHT liver lobe and extending into the upper paracolic gutter, worrisome for early developing abscess, measuring approximately 5 cm greatest dimension (series 2, image 32; coronal series 5, images 51 through 59). Additional fluid is seen over the RIGHT liver lobe, possibly contiguous. No free intraperitoneal air. No  evidence of acute hemorrhage within the abdomen or pelvis. Musculoskeletal: Degenerative spondylosis of the scoliotic lower lumbar spine, at least moderate in degree. No acute-appearing osseous abnormality. Surgical defect within the anterior abdominal wall, with associated packing. IMPRESSION: 1. Circumscribed fluid collection within the RIGHT upper quadrant, underlying the RIGHT liver lobe and extending into the upper RIGHT paracolic gutter, worrisome for early developing abscess, measuring approximately 5 cm greatest dimension. Additional fluid is seen over the RIGHT liver lobe, possibly contiguous. 2. No additional circumscribed fluid collection or abscess-like collection is seen within the abdomen or pelvis. Expected small amount of free fluid scattered throughout the abdomen and pelvis. No evidence of acute hemorrhage within the abdomen or pelvis. 3. Surgical changes of a partial colon resection in the posterior pelvis and LEFT abdominal wall ostomy creation. 4. Mildly distended gas-filled loops of small bowel within the RIGHT abdomen, likely ileus. 5. Thickening of the walls of the small bowel in the RIGHT abdomen and upper pelvis, likely reactive in nature. 6. Small RIGHT pleural effusion with compressive atelectasis. Aortic Atherosclerosis (ICD10-I70.0). Electronically Signed   By: Franki Cabot M.D.   On: 09/10/2020 10:40   DG Chest Port 1 View  Result Date: 09/10/2020 CLINICAL DATA:  Fever. EXAM: PORTABLE CHEST 1 VIEW COMPARISON:  September 06, 2020. FINDINGS: The heart size and mediastinal contours are within normal limits. Hypoinflation of the lungs is noted. Mild bibasilar subsegmental atelectasis is noted. There is interval development of patchy opacity seen laterally in the left lung concerning for atypical inflammation. Probable small right pleural effusion. The visualized skeletal structures are unremarkable. IMPRESSION: Hypoinflation of the lungs is noted with mild bibasilar subsegmental  atelectasis. Probable small right pleural effusion is noted. There is interval development of patchy opacities seen laterally in the left lung concerning for atypical inflammation. Electronically Signed   By: Marijo Conception M.D.   On: 09/10/2020 09:56     Scheduled Meds:    amLODipine  10 mg Oral Daily   heparin  5,000 Units Subcutaneous Q8H   multivitamin with minerals  1 tablet Oral Daily   nebivolol  20 mg Oral Q1200   sodium chloride flush  3 mL Intravenous Q12H   sodium chloride flush  3 mL Intravenous Q12H    Continuous Infusions:    sodium chloride     piperacillin-tazobactam (ZOSYN)  IV 3.375 g (09/11/20 1422)     LOS: 1 day  Vernell Leep, MD, [place], [facility]. [facility]    To contact the attending provider between 7A-7P or the covering provider during after hours 7P-7A, please log into the web site [email or web address] and access using universal [place] password for that web site. If you do not have the password, please call the hospital operator.  09/11/2020, 6:12 PM

## 2020-09-11 NOTE — Progress Notes (Signed)
   09/10/20 2013  Assess: MEWS Score  Temp (!) 102.7 F (39.3 C) (nurse notified)  BP (!) 148/75  Pulse Rate 100  Resp 17  SpO2 97 %  O2 Device Room Air  Assess: MEWS Score  MEWS Temp 2  MEWS Systolic 0  MEWS Pulse 0  MEWS RR 0  MEWS LOC 0  MEWS Score 2  MEWS Score Color Yellow  Assess: if the MEWS score is Yellow or Red  Were vital signs taken at a resting state? Yes  Focused Assessment Change from prior assessment (see assessment flowsheet)  Early Detection of Sepsis Score *See Row Information* Low  MEWS guidelines implemented *See Row Information* Yes  Treat  MEWS Interventions Administered scheduled meds/treatments  Pain Scale 0-10  Pain Score 5  Faces Pain Scale 2  Pain Type Acute pain  Pain Location Abdomen  Pain Orientation Lower  Pain Descriptors / Indicators Aching;Burning  Pain Frequency Intermittent  Pain Onset On-going  Patients Stated Pain Goal 0  Pain Intervention(s) Repositioned  Take Vital Signs  Increase Vital Sign Frequency  Yellow: Q 2hr X 2 then Q 4hr X 2, if remains yellow, continue Q 4hrs  Escalate  MEWS: Escalate Yellow: discuss with charge nurse/RN and consider discussing with provider and RRT  Notify: Charge Nurse/RN  Name of Charge Nurse/RN Notified [place], RN  Date Charge Nurse/RN Notified 09/10/20  Time Charge Nurse/RN Notified 2045  Notify: Provider  Provider Name/Title [place]  Date Provider Notified 09/10/20  Time Provider Notified 2045  Notification Type Page  Notification Reason Change in status  Provider response See new orders  Date of Provider Response 09/10/20  Time of Provider Response 2055  Document  Progress note created (see row info) Yes

## 2020-09-11 NOTE — Plan of Care (Signed)
  Problem: Nutrition: Goal: Adequate nutrition will be maintained 09/11/2020 1659 by Mohamed-Medani, Thorvald Orsino A, RN Outcome: Progressing 09/11/2020 1658 by Mohamed-Medani, Jvon Meroney A, RN Outcome: Progressing   Problem: Pain Managment: Goal: General experience of comfort will improve Outcome: Progressing

## 2020-09-12 DIAGNOSIS — A419 Sepsis, unspecified organism: Secondary | ICD-10-CM | POA: Diagnosis not present

## 2020-09-12 LAB — TYPE AND SCREEN
ABO/RH(D): O POS
Antibody Screen: NEGATIVE

## 2020-09-12 LAB — CBC
HCT: 23.2 % — ABNORMAL LOW (ref 36.0–46.0)
Hemoglobin: 7.7 g/dL — ABNORMAL LOW (ref 12.0–15.0)
MCH: 31.6 pg (ref 26.0–34.0)
MCHC: 33.2 g/dL (ref 30.0–36.0)
MCV: 95.1 fL (ref 80.0–100.0)
Platelets: 242 10*3/uL (ref 150–400)
RBC: 2.44 MIL/uL — ABNORMAL LOW (ref 3.87–5.11)
RDW: 13.6 % (ref 11.5–15.5)
WBC: 19.6 10*3/uL — ABNORMAL HIGH (ref 4.0–10.5)
nRBC: 0 % (ref 0.0–0.2)

## 2020-09-12 LAB — BASIC METABOLIC PANEL
Anion gap: 8 (ref 5–15)
BUN: 7 mg/dL — ABNORMAL LOW (ref 8–23)
CO2: 23 mmol/L (ref 22–32)
Calcium: 7.9 mg/dL — ABNORMAL LOW (ref 8.9–10.3)
Chloride: 99 mmol/L (ref 98–111)
Creatinine, Ser: 0.78 mg/dL (ref 0.44–1.00)
GFR, Estimated: >60 mL/min (ref >60)
Glucose, Bld: 165 mg/dL — ABNORMAL HIGH (ref 70–99)
Potassium: 2.6 mmol/L — CL (ref 3.5–5.1)
Sodium: 130 mmol/L — ABNORMAL LOW (ref 135–145)

## 2020-09-12 LAB — GLUCOSE, CAPILLARY
Glucose-Capillary: 116 mg/dL — ABNORMAL HIGH (ref 70–99)
Glucose-Capillary: 130 mg/dL — ABNORMAL HIGH (ref 70–99)
Glucose-Capillary: 145 mg/dL — ABNORMAL HIGH (ref 70–99)
Glucose-Capillary: 153 mg/dL — ABNORMAL HIGH (ref 70–99)
Glucose-Capillary: 177 mg/dL — ABNORMAL HIGH (ref 70–99)
Glucose-Capillary: 96 mg/dL (ref 70–99)

## 2020-09-12 MED ORDER — POTASSIUM CHLORIDE CRYS ER 20 MEQ PO TBCR
40.0000 meq | EXTENDED_RELEASE_TABLET | ORAL | Status: AC
  Administered 2020-09-12: 40 meq via ORAL
  Filled 2020-09-12: qty 2

## 2020-09-12 MED ORDER — POTASSIUM CHLORIDE CRYS ER 20 MEQ PO TBCR
40.0000 meq | EXTENDED_RELEASE_TABLET | Freq: Once | ORAL | Status: AC
  Administered 2020-09-12: 40 meq via ORAL
  Filled 2020-09-12: qty 2

## 2020-09-12 MED ORDER — ENSURE ENLIVE PO LIQD
237.0000 mL | Freq: Two times a day (BID) | ORAL | Status: DC
  Administered 2020-09-12 – 2020-09-18 (×12): 237 mL via ORAL

## 2020-09-12 MED ORDER — POTASSIUM CHLORIDE CRYS ER 20 MEQ PO TBCR
40.0000 meq | EXTENDED_RELEASE_TABLET | Freq: Two times a day (BID) | ORAL | Status: DC
  Administered 2020-09-12: 40 meq via ORAL
  Filled 2020-09-12: qty 2

## 2020-09-12 NOTE — Progress Notes (Signed)
Critical lab, K=2.6 from 3.6 yesterday  Date/Time Provider Informed: 09/12/2020; Cris.Burner  Name of Provider.: Dr. Johann Capers  Provider Response: Awaiting reply/order.

## 2020-09-12 NOTE — Progress Notes (Signed)
PROGRESS NOTE   Anne Aguirre  TUU:[id number]    DOB: 1950-10-14    DOA: 09/10/2020  PCP: [place], [name], MD   I have briefly reviewed patients previous medical records in [facility].  Chief complaint Postop abscess   Brief Narrative:  70 year old married female with medical history significant for HTN, HLD, persistent hyponatremia, emergent exploratory laparotomy with partial colectomy and end colostomy for perforated diverticulitis on 8/16, tested positive for COVID during that hospital admission, discharged home on 8/26 and doing well, started noticing fever and chills up to 201 F on 8/27 without abdominal pain but did start noticing dyspnea.  CT abdomen and pelvis showed findings worrisome for possible abscess in the right upper quadrant.  WBC 30.4.  She was transferred from [facility] to [facility] for surgical evaluation.  Admitted for possible intra-abdominal abscess s/p Hartman's procedure for perforated diverticulitis.  CCS consulted IR who recommended that the abscesses are all too small for IR to aspirate/drain at this time.   Assessment & Plan:  Principal Problem:   Sepsis Due to Intraabdomonal Abscess Active Problems:   Essential hypertension, benign   Hyponatremia   COVID-19 virus infection ---- +ve on 09/01/2020   Post-op intraabdominal/Liver abscess-Had Hartmann's with Colostomy on 08/29/2020 for Sigmoid Diverticulits   Sepsis POA secondary to intra-abdominal abscesses s/p Hartman's procedure for perforated diverticulitis: Met sepsis criteria on admission including fever, tachycardia, tachypnea and WBC of 30.4.  General surgery input appreciated.  Continue IV antibiotics/Zosyn.  As per IR, abscesses are too small to aspirate or drain at this time.  Regular diet resumed.  Blood cultures x2: Negative to date.  Urine culture pending.  Spiked fever of 102.7 F on 8/28.  Patient has defervesced.  Leukocytosis had improved from 30-18 but now back up to 20 K.  General  surgery following.  Diarrhea: Patient reports 5-6 episodes of diarrhea overnight but none since 5 AM when I saw her at around 11.  Multifactorial: Antibiotics, abscess with GI irritation but C. difficile needs to be considered if she has persistent diarrhea.  Monitor closely.  Recent COVID-19 infection: Tested positive for COVID on 09/01/2020.  Initially was hypoxic and treated with steroids and weaned off oxygen prior to discharge home.  Repeat SARS coronavirus 2 RT-PCR positive.  Continue isolation precautions.  Chest x-ray and CT abdomen findings of possible COVID-pneumonia.  No hypoxia.  Avoiding steroids due to sepsis and intra-abdominal abscess.  Stable.  Anemia, multifactorial: Initially may have been due to acute blood loss.  Hemoglobin ranging in the 8-9 range at time of discharge.  Now in the mid 7 range.  Follow CBC daily and transfuse if hemoglobin 7 g or less.  Hyponatremia: Stable.  Hypoglycemia: Appears to be recurrent.  May be related to poor oral intake.  Brief started on D10 infusion.  Since eating okay, hold D10 and monitor CBGs.  Severe hypokalemia: Potassium 2.6.  Likely related to diarrhea overnight.  Replace aggressively and follow.  Check and replace magnesium as needed.  Essential hypertension: Controlled on home meds.  Body mass index is 24.74 kg/m.    DVT prophylaxis: heparin injection 5,000 Units Start: 09/11/20 2200 SCDs Start: 09/10/20 1323 Place TED hose Start: 09/10/20 1323     Code Status: Full Code Family Communication: None at bedside today. Disposition:  Status is: Inpatient  Remains inpatient appropriate because:Inpatient level of care appropriate due to severity of illness  Dispo: The patient is from: Home  Anticipated d/c is to: Home              Patient currently is not medically stable to d/c.   Difficult to place patient No        Consultants:   General surgery Interventional radiology  Procedures:    None  Antimicrobials:    Anti-infectives (From admission, onward)    Start     Dose/Rate Route Frequency Ordered Stop   09/10/20 1130  piperacillin-tazobactam (ZOSYN) IVPB 3.375 g        3.375 g 12.5 mL/hr over 240 Minutes Intravenous Every 8 hours 09/10/20 1127           Subjective:  Says she had 5-6 episodes of watery diarrhea overnight but none since 5 AM when I saw her at 11.  No abdominal pain.  Eating a little.  Objective:   Vitals:   09/12/20 0002 09/12/20 0336 09/12/20 1022 09/12/20 1608  BP: 119/81 111/64 115/72 128/79  Pulse: 90 79 80 80  Resp: 18 20 18    Temp: 98.8 F (37.1 C) 99.4 F (37.4 C) 98.5 F (36.9 C) 98.5 F (36.9 C)  TempSrc: Oral Oral Oral Oral  SpO2: 95% 96% 97% 100%  Weight:      Height:        General exam: Middle-age female, moderately built and nourished lying comfortably propped up in bed without distress.  Does not look septic or toxic. Respiratory system: Clear to auscultation.  No increased work of breathing. Cardiovascular system: S1 and S2 heard, RRR.  No JVD, murmurs or pedal edema.  Telemetry personally reviewed: Sinus rhythm. Gastrointestinal system: Abdomen is nondistended, soft and nontender. No organomegaly or masses felt. Normal bowel sounds heard.  Left-sided ostomy intact with soft brown stools.  Right sided surgical site dressing clean and dry. Central nervous system: Alert and oriented. No focal neurological deficits. Extremities: Symmetric 5 x 5 power. Skin: No rashes, lesions or ulcers Psychiatry: Judgement and insight appear normal. Mood & affect appropriate.     Data Reviewed:   I have personally reviewed following labs and imaging studies   CBC: Recent Labs  Lab 09/10/20 0910 09/11/20 0024 09/11/20 0933 09/12/20 0340  WBC 30.4* 18.5* 18.3* 19.6*  NEUTROABS 26.6*  --   --   --   HGB 9.9* 7.7* 7.8* 7.7*  HCT 29.7* 24.1* 23.1* 23.2*  MCV 97.1 98.4 95.9 95.1  PLT 424* 243 217 101    Basic Metabolic  Panel: Recent Labs  Lab 09/07/20 0330 09/10/20 0910 09/11/20 0024 09/11/20 0648 09/12/20 0340  NA 133*   < > 131* 132* 130*  K 4.3   < > 3.3* 3.6 2.6*  CL 101   < > 101 100 99  CO2 28   < > 22 21* 23  GLUCOSE 225*   < > 77 57* 165*  BUN 19   < > 10 9 7*  CREATININE 0.55   < > 0.77 0.84 0.78  CALCIUM 8.4*   < > 8.1* 8.1* 7.9*  MG 1.7  --   --   --   --   PHOS 4.2  --   --   --   --    < > = values in this interval not displayed.    Liver Function Tests: Recent Labs  Lab 09/07/20 0330 09/10/20 0910 09/11/20 0024  AST 21 32 29  ALT 27 37 33  ALKPHOS 52 65 58  BILITOT 0.4 1.2 1.5*  PROT 4.7* 5.4* 4.6*  ALBUMIN 1.9* 2.3* 1.8*    CBG: Recent Labs  Lab 09/12/20 0822 09/12/20 1429 09/12/20 1610  GLUCAP 145* 130* 116*    Microbiology Studies:   Recent Results (from the past 240 hour(s))  Blood Culture (routine x 2)     Status: None (Preliminary result)   Collection Time: 09/10/20  9:10 AM   Specimen: BLOOD RIGHT FOREARM  Result Value Ref Range Status   Specimen Description   Final    BLOOD RIGHT FOREARM BOTTLES DRAWN AEROBIC AND ANAEROBIC Blood Culture adequate volume   Special Requests NONE  Final   Culture   Final    NO GROWTH 2 DAYS Performed at [facility], [address], [place], [place] [address]    Report Status PENDING  Incomplete  Blood Culture (routine x 2)     Status: None (Preliminary result)   Collection Time: 09/10/20  9:33 AM   Specimen: BLOOD LEFT ARM  Result Value Ref Range Status   Specimen Description BLOOD LEFT ARM BOTTLES DRAWN AEROBIC ONLY  Final   Special Requests Blood Culture adequate volume  Final   Culture   Final    NO GROWTH 2 DAYS Performed at [facility], [address], [place], [place] [address]    Report Status PENDING  Incomplete  Resp Panel by RT-PCR (Flu A&B, Covid) Nasopharyngeal Swab     Status: Abnormal   Collection Time: 09/10/20 11:43 AM   Specimen: Nasopharyngeal Swab; Nasopharyngeal(NP) swabs in vial transport  medium  Result Value Ref Range Status   SARS Coronavirus 2 by RT PCR POSITIVE (A) NEGATIVE Final    Comment: CRITICAL RESULT CALLED TO, READ BACK BY AND VERIFIED WITH: DAVIS,N 1311 09/10/2020 COLEMAN,R (NOTE) SARS-CoV-2 target nucleic acids are DETECTED.  The SARS-CoV-2 RNA is generally detectable in upper respiratory specimens during the acute phase of infection. Positive results are indicative of the presence of the identified virus, but do not rule out bacterial infection or co-infection with other pathogens not detected by the test. Clinical correlation with patient history and other diagnostic information is necessary to determine patient infection status. The expected result is Negative.  Fact Sheet for Patients: EntrepreneurPulse.com.au  Fact Sheet for Healthcare Providers: IncredibleEmployment.be  This test is not yet approved or cleared by the [place] FDA and  has been authorized for detection and/or diagnosis of SARS-CoV-2 by FDA under an Emergency Use Authorization (EUA).  This EUA will remain in effect (meaning this te st can be used) for the duration of  the COVID-19 declaration under Section 564(b)(1) of the Act, 21 U.S.C. section 360bbb-3(b)(1), unless the authorization is terminated or revoked sooner.     Influenza A by PCR NEGATIVE NEGATIVE Final   Influenza B by PCR NEGATIVE NEGATIVE Final    Comment: (NOTE) The Xpert Xpress SARS-CoV-2/FLU/RSV plus assay is intended as an aid in the diagnosis of influenza from Nasopharyngeal swab specimens and should not be used as a sole basis for treatment. Nasal washings and aspirates are unacceptable for Xpert Xpress SARS-CoV-2/FLU/RSV testing.  Fact Sheet for Patients: EntrepreneurPulse.com.au  Fact Sheet for Healthcare Providers: IncredibleEmployment.be  This test is not yet approved or cleared by the [place] FDA and has been  authorized for detection and/or diagnosis of SARS-CoV-2 by FDA under an Emergency Use Authorization (EUA). This EUA will remain in effect (meaning this test can be used) for the duration of the COVID-19 declaration under Section 564(b)(1) of the Act, 21 U.S.C. section 360bbb-3(b)(1), unless the authorization is terminated or revoked.  Performed at [facility], [address], [place], [place] [address]   Urine Culture     Status: Abnormal (Preliminary result)   Collection Time: 09/10/20  3:03 PM   Specimen: In/Out Cath Urine  Result Value Ref Range Status   Specimen Description   Final    IN/OUT CATH URINE Performed at [facility], [address], [place], [place] [address]    Special Requests   Final    NONE Performed at [facility], [address], [place], [place] [address]    Culture (A)  Final    10,000 COLONIES/mL KLEBSIELLA PNEUMONIAE CULTURE REINCUBATED FOR BETTER GROWTH Performed at [facility], [place] [address], [place],  [address]    Report Status PENDING  Incomplete     Radiology Studies:  No results found.   Scheduled Meds:    amLODipine  10 mg Oral Daily   feeding supplement  237 mL Oral BID BM   heparin  5,000 Units Subcutaneous Q8H   multivitamin with minerals  1 tablet Oral Daily   nebivolol  20 mg Oral Q1200   sodium chloride flush  3 mL Intravenous Q12H   sodium chloride flush  3 mL Intravenous Q12H    Continuous Infusions:    sodium chloride     dextrose 50 mL/hr at 09/12/20 1036   piperacillin-tazobactam (ZOSYN)  IV 3.375 g (09/12/20 1338)     LOS: 2 days     Vernell Leep, MD, [place], [facility]. [facility]    To contact the attending provider between 7A-7P or the covering provider during after hours 7P-7A, please log into the web site [email or web address] and access using universal [place] password for that web site. If you do not have the password, please call the hospital operator.  09/12/2020, 5:54 PM

## 2020-09-12 NOTE — Progress Notes (Signed)
Subjective: CC: Patient reports she was doing well until she had dinner (fruit, blueberry muffin) and then starting having bloating, pressure and "gas pains" followed by liquid stool from her colostomy. 1.25L/24 hours. Reports she was not having much output at home prior to readmission. She denies n/v. Has not eaten this am. Mobilized in the room yesterday.   Objective: Vital signs in last 24 hours: Temp:  [98.8 F (37.1 C)-99.4 F (37.4 C)] 99.4 F (37.4 C) (08/30 0336) Pulse Rate:  [79-94] 79 (08/30 0336) Resp:  [18-20] 20 (08/30 0336) BP: (111-130)/(62-81) 111/64 (08/30 0336) SpO2:  [93 %-97 %] 96 % (08/30 0336) Last BM Date: 09/11/20 (thru colostomy)  Intake/Output from previous day: 08/29 0701 - 08/30 0700 In: 1759.3 [P.O.:1050; I.V.:440.7; IV Piggyback:268.5] Out: 2800 [Urine:1550; Stool:1250] Intake/Output this shift: No intake/output data recorded.  PE: Gen:  Alert, NAD, pleasant Pulm:  Normal rate and effort Abd: Soft, NT, midline wound is clean with dehiscence but no evisceration. Colostomy with air and liquid stool in the bag. Unable to see stoma 2/2 stool. Psych: A&Ox3  MSK: RUE edema noted. RN to replace IV line to LUE as suspected it infiltrated. 1+ LE edema w/ TED hose in place  Skin: no rashes noted, warm and dry  Lab Results:  Recent Labs    09/11/20 0933 09/12/20 0340  WBC 18.3* 19.6*  HGB 7.8* 7.7*  HCT 23.1* 23.2*  PLT 217 242   BMET Recent Labs    09/11/20 0648 09/12/20 0340  NA 132* 130*  K 3.6 2.6*  CL 100 99  CO2 21* 23  GLUCOSE 57* 165*  BUN 9 7*  CREATININE 0.84 0.78  CALCIUM 8.1* 7.9*   PT/INR Recent Labs    09/10/20 0910  LABPROT 14.4  INR 1.1   CMP     Component Value Date/Time   NA 130 (L) 09/12/2020 0340   K 2.6 (LL) 09/12/2020 0340   CL 99 09/12/2020 0340   CO2 23 09/12/2020 0340   GLUCOSE 165 (H) 09/12/2020 0340   BUN 7 (L) 09/12/2020 0340   CREATININE 0.78 09/12/2020 0340   CREATININE 1.01 (H)  04/04/2020 0000   CALCIUM 7.9 (L) 09/12/2020 0340   PROT 4.6 (L) 09/11/2020 0024   ALBUMIN 1.8 (L) 09/11/2020 0024   AST 29 09/11/2020 0024   ALT 33 09/11/2020 0024   ALKPHOS 58 09/11/2020 0024   BILITOT 1.5 (H) 09/11/2020 0024   GFRNONAA >60 09/12/2020 0340   GFRNONAA 57 (L) 04/04/2020 0000   GFRAA 66 04/04/2020 0000   Lipase     Component Value Date/Time   LIPASE 20 08/23/2020 1341    Studies/Results: CT ABDOMEN PELVIS W CONTRAST  Result Date: 09/10/2020 CLINICAL DATA:  Abdominal pain, fever. Recent surgery for diverticulitis with abscess. New onset fevers and abdominal pain this morning. EXAM: CT ABDOMEN AND PELVIS WITH CONTRAST TECHNIQUE: Multidetector CT imaging of the abdomen and pelvis was performed using the standard protocol following bolus administration of intravenous contrast. CONTRAST:  9mL OMNIPAQUE IOHEXOL 350 MG/ML SOLN COMPARISON:  CT abdomen dated 08/25/2020. FINDINGS: Lower chest: Small RIGHT pleural effusion with compressive atelectasis. Additional mild bibasilar scarring/atelectasis. Hepatobiliary: Hepatic cysts. Gallbladder is unremarkable. Nose significant bile duct dilatation appreciated. Pancreas: Unremarkable. No pancreatic ductal dilatation or surrounding inflammatory changes. Spleen: Normal in size without focal abnormality. Adrenals/Urinary Tract: Adrenal glands appear normal. Kidneys are unremarkable without suspicious mass, stone or hydronephrosis. Stomach/Bowel: Surgical changes of a partial colon resection in the posterior pelvis  and LEFT abdominal wall ostomy creation. Mildly distended gas-filled loops of small bowel are seen within the RIGHT abdomen, likely ileus. Thickening of the walls of the small bowel in the RIGHT abdomen and upper pelvis is likely reactive in nature. Vascular/Lymphatic: Aortic atherosclerosis. No acute appearing vascular abnormality. No enlarged lymph nodes are identified. Reproductive: Uterus and bilateral adnexa are unremarkable.  Other: Small amount of free fluid scattered throughout the abdomen and pelvis. Slightly more circumscribed fluid collection within the RIGHT upper quadrant, underlying the RIGHT liver lobe and extending into the upper paracolic gutter, worrisome for early developing abscess, measuring approximately 5 cm greatest dimension (series 2, image 32; coronal series 5, images 51 through 59). Additional fluid is seen over the RIGHT liver lobe, possibly contiguous. No free intraperitoneal air. No evidence of acute hemorrhage within the abdomen or pelvis. Musculoskeletal: Degenerative spondylosis of the scoliotic lower lumbar spine, at least moderate in degree. No acute-appearing osseous abnormality. Surgical defect within the anterior abdominal wall, with associated packing. IMPRESSION: 1. Circumscribed fluid collection within the RIGHT upper quadrant, underlying the RIGHT liver lobe and extending into the upper RIGHT paracolic gutter, worrisome for early developing abscess, measuring approximately 5 cm greatest dimension. Additional fluid is seen over the RIGHT liver lobe, possibly contiguous. 2. No additional circumscribed fluid collection or abscess-like collection is seen within the abdomen or pelvis. Expected small amount of free fluid scattered throughout the abdomen and pelvis. No evidence of acute hemorrhage within the abdomen or pelvis. 3. Surgical changes of a partial colon resection in the posterior pelvis and LEFT abdominal wall ostomy creation. 4. Mildly distended gas-filled loops of small bowel within the RIGHT abdomen, likely ileus. 5. Thickening of the walls of the small bowel in the RIGHT abdomen and upper pelvis, likely reactive in nature. 6. Small RIGHT pleural effusion with compressive atelectasis. Aortic Atherosclerosis (ICD10-I70.0). Electronically Signed   By: Bary Richard M.D.   On: 09/10/2020 10:40   DG Chest Port 1 View  Result Date: 09/10/2020 CLINICAL DATA:  Fever. EXAM: PORTABLE CHEST 1 VIEW  COMPARISON:  September 06, 2020. FINDINGS: The heart size and mediastinal contours are within normal limits. Hypoinflation of the lungs is noted. Mild bibasilar subsegmental atelectasis is noted. There is interval development of patchy opacity seen laterally in the left lung concerning for atypical inflammation. Probable small right pleural effusion. The visualized skeletal structures are unremarkable. IMPRESSION: Hypoinflation of the lungs is noted with mild bibasilar subsegmental atelectasis. Probable small right pleural effusion is noted. There is interval development of patchy opacities seen laterally in the left lung concerning for atypical inflammation. Electronically Signed   By: Lupita Raider M.D.   On: 09/10/2020 09:56    Anti-infectives: Anti-infectives (From admission, onward)    Start     Dose/Rate Route Frequency Ordered Stop   09/10/20 1130  piperacillin-tazobactam (ZOSYN) IVPB 3.375 g        3.375 g 12.5 mL/hr over 240 Minutes Intravenous Every 8 hours 09/10/20 1127          Assessment/Plan POD#14 s/p ex lap and Hartman's procedure with colostomy by AR 8/16 for diverticulitis with abscess - CT 8/28 w/ fluid collection in the RUQ ~5cm in greatest dimension. IR evaluated, not amenable to drainage.  - Cont abx. Trend WBC - Patient with liquid output from colostomy > 1L/24 hours. Monitor. Consider C. Diff testing if continues but do not feel this is indicated currently. There was no inflammation of the colon noted on CT - FLD -  BID WTD - IS/pulm toilet - mobilize, PT rec HH    FEN: FLD ID: Zosyn VTE: scds, subq heparin   HTN COVID+   LOS: 2 days    Jacinto Halim , [facility] 09/12/2020, 8:29 AM Please see Amion for pager number during day hours 7:00am-4:30pm

## 2020-09-13 ENCOUNTER — Inpatient Hospital Stay (HOSPITAL_COMMUNITY): Payer: Medicare PPO

## 2020-09-13 DIAGNOSIS — A419 Sepsis, unspecified organism: Secondary | ICD-10-CM | POA: Diagnosis not present

## 2020-09-13 LAB — CBC
HCT: 24.6 % — ABNORMAL LOW (ref 36.0–46.0)
Hemoglobin: 8.3 g/dL — ABNORMAL LOW (ref 12.0–15.0)
MCH: 31.7 pg (ref 26.0–34.0)
MCHC: 33.7 g/dL (ref 30.0–36.0)
MCV: 93.9 fL (ref 80.0–100.0)
Platelets: 282 10*3/uL (ref 150–400)
RBC: 2.62 MIL/uL — ABNORMAL LOW (ref 3.87–5.11)
RDW: 13.4 % (ref 11.5–15.5)
WBC: 15.5 10*3/uL — ABNORMAL HIGH (ref 4.0–10.5)
nRBC: 0 % (ref 0.0–0.2)

## 2020-09-13 LAB — BASIC METABOLIC PANEL
Anion gap: 7 (ref 5–15)
BUN: 5 mg/dL — ABNORMAL LOW (ref 8–23)
CO2: 24 mmol/L (ref 22–32)
Calcium: 8 mg/dL — ABNORMAL LOW (ref 8.9–10.3)
Chloride: 100 mmol/L (ref 98–111)
Creatinine, Ser: 0.74 mg/dL (ref 0.44–1.00)
GFR, Estimated: >60 mL/min (ref >60)
Glucose, Bld: 101 mg/dL — ABNORMAL HIGH (ref 70–99)
Potassium: 3.4 mmol/L — ABNORMAL LOW (ref 3.5–5.1)
Sodium: 131 mmol/L — ABNORMAL LOW (ref 135–145)

## 2020-09-13 LAB — GLUCOSE, CAPILLARY
Glucose-Capillary: 103 mg/dL — ABNORMAL HIGH (ref 70–99)
Glucose-Capillary: 111 mg/dL — ABNORMAL HIGH (ref 70–99)
Glucose-Capillary: 85 mg/dL (ref 70–99)
Glucose-Capillary: 87 mg/dL (ref 70–99)
Glucose-Capillary: 89 mg/dL (ref 70–99)
Glucose-Capillary: 98 mg/dL (ref 70–99)

## 2020-09-13 LAB — MAGNESIUM: Magnesium: 1.4 mg/dL — ABNORMAL LOW (ref 1.7–2.4)

## 2020-09-13 MED ORDER — OXYCODONE HCL 5 MG PO TABS
5.0000 mg | ORAL_TABLET | Freq: Four times a day (QID) | ORAL | Status: DC | PRN
  Administered 2020-09-15 – 2020-09-16 (×3): 10 mg via ORAL
  Administered 2020-09-18 (×2): 5 mg via ORAL
  Filled 2020-09-13 (×3): qty 2
  Filled 2020-09-13: qty 1
  Filled 2020-09-13 (×2): qty 2

## 2020-09-13 MED ORDER — PANTOPRAZOLE SODIUM 40 MG PO TBEC
40.0000 mg | DELAYED_RELEASE_TABLET | Freq: Every day | ORAL | Status: DC
  Administered 2020-09-13 – 2020-09-18 (×6): 40 mg via ORAL
  Filled 2020-09-13 (×6): qty 1

## 2020-09-13 MED ORDER — POTASSIUM CHLORIDE 2 MEQ/ML IV SOLN
INTRAVENOUS | Status: DC
  Filled 2020-09-13 (×6): qty 1000

## 2020-09-13 MED ORDER — SIMETHICONE 80 MG PO CHEW
80.0000 mg | CHEWABLE_TABLET | Freq: Four times a day (QID) | ORAL | Status: DC | PRN
  Administered 2020-09-13: 80 mg via ORAL
  Filled 2020-09-13: qty 1

## 2020-09-13 MED ORDER — MORPHINE SULFATE (PF) 2 MG/ML IV SOLN
2.0000 mg | INTRAVENOUS | Status: DC | PRN
Start: 2020-09-13 — End: 2020-09-18
  Administered 2020-09-13 (×2): 2 mg via INTRAVENOUS
  Filled 2020-09-13 (×2): qty 1

## 2020-09-13 NOTE — Progress Notes (Signed)
Initial Nutrition Assessment  DOCUMENTATION CODES:   Not applicable  INTERVENTION:   Ensure Enlive po BID, each supplement provides 350 kcal and 20 grams of protein  MVI with Minerals daily   NUTRITION DIAGNOSIS:   Inadequate oral intake related to altered GI function, decreased appetite as evidenced by per patient/family report.  GOAL:   Patient will meet greater than or equal to 90% of their needs   MONITOR:   PO intake, Supplement acceptance, Labs, Weight trends, I & O's  REASON FOR ASSESSMENT:   Malnutrition Screening Tool    ASSESSMENT:   70 yo female admitted with intraabdominal abscess s/p recent Hartman's procedure with colostomy for perforated diverticulitis. Pt with recent COVID infection. PMH includes HTN  8/28 CT abdomen with fluid collection in RUQ 8/31 Abd xray with mild gas distended loops throughout bowel  Recorded po intake 50% of meals on [place] diet. Pt with worsening abdominal pain, bloating and cramping with burping, relfux and distention after oral intake. No N/V  Started on simethicone and PPI today  +air and liquid stool via colostomy. Noted considering checking for C.diff if no improvement  Labs: sodium 131 (L), potassium 3.4 (L), magnesium 1.4 (L) Meds: MVI with Minerals, simethicone, protonix   Diet Order:   Diet Order             Diet full liquid Room service appropriate? Yes; Fluid consistency: Thin  Diet effective now                   EDUCATION NEEDS:   Not appropriate for education at this time  Skin:  Skin Assessment: Reviewed RN Assessment  Last BM:  8/31  Height:   Ht Readings from Last 1 Encounters:  09/10/20 5\' 6"  (1.676 m)    Weight:   Wt Readings from Last 1 Encounters:  09/10/20 69.5 kg    BMI:  Body mass index is 24.74 kg/m.  Estimated Nutritional Needs:   Kcal:  1750-1950 kcals  Protein:  90-100 g  Fluid:  >/= 1.8 L    09/12/20 MS, RDN, LDN, CNSC Registered Dietitian III Clinical  Nutrition RD Pager and On-Call Pager Number Located in [place]

## 2020-09-13 NOTE — Progress Notes (Signed)
Subjective: CC: Patient reports that she was only able taking clear liquids and increased breeze yesterday.  After oral intake she had worsening generalized abdominal bloating/cramping with associated burping/belching, reflux and distention.  She denies any nausea or vomiting.  She continues to have air and liquid stool output from her colostomy.  Objective: Vital signs in last 24 hours: Temp:  [98.4 F (36.9 C)-99.8 F (37.7 C)] 98.4 F (36.9 C) (08/31 0426) Pulse Rate:  [80-97] 83 (08/31 0426) Resp:  [17-18] 18 (08/31 0426) BP: (101-128)/(67-79) 111/77 (08/31 0426) SpO2:  [97 %-100 %] 97 % (08/31 0426) Last BM Date: 09/12/20 (thru colostomy)  Intake/Output from previous day: 08/30 0701 - 08/31 0700 In: 779.9 [P.O.:440; I.V.:209.5; IV Piggyback:130.4] Out: 1800 [Urine:1000; Stool:800] Intake/Output this shift: No intake/output data recorded.  PE: Gen:  Alert, NAD, pleasant Pulm:  Normal rate and effort Abd: Soft, generalized tenderness without peritonitis, midline wound is clean with dehiscence but no evisceration. Colostomy with air and liquid stool in the bag. Unable to see stoma 2/2 stool.  Psych: A&Ox3  Skin: no rashes noted, warm and dry  Lab Results:  Recent Labs    09/12/20 0340 09/13/20 0003  WBC 19.6* 15.5*  HGB 7.7* 8.3*  HCT 23.2* 24.6*  PLT 242 282   BMET Recent Labs    09/12/20 0340 09/13/20 0003  NA 130* 131*  K 2.6* 3.4*  CL 99 100  CO2 23 24  GLUCOSE 165* 101*  BUN 7* 5*  CREATININE 0.78 0.74  CALCIUM 7.9* 8.0*   PT/INR Recent Labs    09/10/20 0910  LABPROT 14.4  INR 1.1   CMP     Component Value Date/Time   NA 131 (L) 09/13/2020 0003   K 3.4 (L) 09/13/2020 0003   CL 100 09/13/2020 0003   CO2 24 09/13/2020 0003   GLUCOSE 101 (H) 09/13/2020 0003   BUN 5 (L) 09/13/2020 0003   CREATININE 0.74 09/13/2020 0003   CREATININE 1.01 (H) 04/04/2020 0000   CALCIUM 8.0 (L) 09/13/2020 0003   PROT 4.6 (L) 09/11/2020 0024   ALBUMIN  1.8 (L) 09/11/2020 0024   AST 29 09/11/2020 0024   ALT 33 09/11/2020 0024   ALKPHOS 58 09/11/2020 0024   BILITOT 1.5 (H) 09/11/2020 0024   GFRNONAA >60 09/13/2020 0003   GFRNONAA 57 (L) 04/04/2020 0000   GFRAA 66 04/04/2020 0000   Lipase     Component Value Date/Time   LIPASE 20 08/23/2020 1341    Studies/Results: No results found.  Anti-infectives: Anti-infectives (From admission, onward)    Start     Dose/Rate Route Frequency Ordered Stop   09/10/20 1130  piperacillin-tazobactam (ZOSYN) IVPB 3.375 g        3.375 g 12.5 mL/hr over 240 Minutes Intravenous Every 8 hours 09/10/20 1127          Assessment/Plan POD#15 s/p ex lap and Hartman's procedure with colostomy by AR 8/16 for diverticulitis with abscess - CT 8/28 w/ fluid collection in the RUQ ~5cm in greatest dimension. IR evaluated, not amenable to drainage.  - Cont abx. WBC downtrending - Add simethicone and ppi for symptomatic relief. Keep on CLD. Will get a plain film on her today. She did have some small bowel distension on recent CT so may have developing ileus but also having ostomy output. - Patient with continued liquid output from colostomy. This is slightly improved from yesterday. Consider C. Diff testing if continues. There was no inflammation of the  colon noted on CT - Keep on FLD + shakes - BID WTD - IS/pulm toilet - mobilize, PT rec HH    FEN: FLD + shakes ID: Zosyn VTE: scds, subq heparin   HTN COVID+   LOS: 3 days    Jacinto Halim , [facility] 09/13/2020, 8:20 AM Please see Amion for pager number during day hours 7:00am-4:30pm

## 2020-09-13 NOTE — Hospital Course (Signed)
70 year old white female community dwelling HTN, recent hospitalization 8/10 through 8/26 sigmoid diverticulitis with local abscess-underwent Hartmann procedure 8/16--diagnosed at that time of COVID-19 infection--Rx with remdesivir steroids completed 7 days of IV steroids  Return to [facility] with fever 101 finished antibiotics 8/26--CT scan showed intra abdominal and liver abscess WBC of 30 lactic acid not elevated-circumscribed fluid collection right upper quadrant IR consulted but collection RUQ not amenable for percutaneous drain

## 2020-09-13 NOTE — Progress Notes (Signed)
PROGRESS NOTE   Anne Aguirre  JKD:[id number] DOB: Oct 22, 1950 DOA: 09/10/2020 PCP: [name], [name], MD  Brief Narrative:  70 year old white female community dwelling HTN, recent hospitalization 8/10 through 8/26 sigmoid diverticulitis with local abscess-underwent Hartmann procedure 8/16--diagnosed at that time of COVID-19 infection--Rx with remdesivir steroids completed 7 days of IV steroids  Return to [facility] with fever 101 finished antibiotics 8/26--CT scan showed intra abdominal and liver abscess WBC of 30 lactic acid not elevated-circumscribed fluid collection right upper quadrant IR consulted but collection RUQ not amenable for percutaneous drain  Has so far been managed conservatively by general surgery  Hospital-Problem based course  Sepsis on admission secondary sigmoid diverticulitis + abscess status post Hartman's 8/16 Fever 102.7 on 8/28-leukocytosis improving significantly--patient however unable to tolerate liquids having pain and discomfort--also having high amount of fluids from ostomy--DDx infectious colitis? Plain film shows gaseous distention Defer further imaging and planning to general surgery If unable to eat may require TPN Diarrhea if continues to have high output would get C. Difficile Recent COVID-19 infection Recent Rx steroids-no hypoxia and asymptomatic DC precautions Hyponatremia hypokalemia Hypokalemia improved-continue monitoring Magnesium 1.4 likely secondary to GI losses-repeat labs a.m. and replace as needed  DVT prophylaxis: Heparin Code Status: Full Family Communication: Discussed with husband [name] at bedside # [phone] Disposition:  Status is: Inpatient  Remains inpatient appropriate because:Hemodynamically unstable and Unsafe d/c plan  Dispo: The patient is from: Home              Anticipated d/c is to:  unClear at this time              Patient currently is not medically stable to d/c.   Difficult to place patient No        Consultants:  Gen surg IR  Procedures: n  Antimicrobials: Zosyn   Subjective: Awake coherent no distress but in pain when she eats No chest pain no nausea She will put out high volume brown unformed liquid  Objective: Vitals:   09/13/20 0037 09/13/20 0426 09/13/20 0838 09/13/20 1214  BP: 101/67 111/77 108/66 106/66  Pulse: 91 83 70 77  Resp: 17 18 18    Temp: 99.8 F (37.7 C) 98.4 F (36.9 C) 98.2 F (36.8 C) 98.3 F (36.8 C)  TempSrc: Oral Oral Oral Oral  SpO2: 97% 97% 99% 100%  Weight:      Height:        Intake/Output Summary (Last 24 hours) at 09/13/2020 1539 Last data filed at 09/13/2020 1142 Gross per 24 hour  Intake 742.64 ml  Output 2250 ml  Net -1507.36 ml   Filed Weights   09/10/20 0901 09/10/20 0927  Weight: 72 kg 69.5 kg    Examination:  EOMI NCAT slightly frail S1-S2 no murmur no rub no gallop Abdomen slightly distended bandages not removed ostomy filled with liquid water brown stool No lower extremity edema no rash ROM intact although weak power is 5/5 grossly-rest of neuro exam deferred  Data Reviewed: personally reviewed   CBC    Component Value Date/Time   WBC 15.5 (H) 09/13/2020 0003   RBC 2.62 (L) 09/13/2020 0003   HGB 8.3 (L) 09/13/2020 0003   HCT 24.6 (L) 09/13/2020 0003   PLT 282 09/13/2020 0003   MCV 93.9 09/13/2020 0003   MCH 31.7 09/13/2020 0003   MCHC 33.7 09/13/2020 0003   RDW 13.4 09/13/2020 0003   LYMPHSABS 1.4 09/10/2020 0910   MONOABS 1.8 (H) 09/10/2020 0910   EOSABS 0.1 09/10/2020 0910  BASOSABS 0.1 09/10/2020 0910   CMP Latest Ref Rng & Units 09/13/2020 09/12/2020 09/11/2020  Glucose 70 - 99 mg/dL 428(J) 681(L) 57(W)  BUN 8 - 23 mg/dL 5(L) 7(L) 9  Creatinine 0.44 - 1.00 mg/dL 6.20 3.55 9.74  Sodium 135 - 145 mmol/L 131(L) 130(L) 132(L)  Potassium 3.5 - 5.1 mmol/L 3.4(L) 2.6(LL) 3.6  Chloride 98 - 111 mmol/L 100 99 100  CO2 22 - 32 mmol/L 24 23 21(L)  Calcium 8.9 - 10.3 mg/dL 8.0(L) 7.9(L) 8.1(L)  Total  Protein 6.5 - 8.1 g/dL - - -  Total Bilirubin 0.3 - 1.2 mg/dL - - -  Alkaline Phos 38 - 126 U/L - - -  AST 15 - 41 U/L - - -  ALT 0 - 44 U/L - - -     Radiology Studies: DG Abd Portable 1V  Result Date: 09/13/2020 CLINICAL DATA:  Abdominal pain, distention, abscess EXAM: PORTABLE ABDOMEN - 1 VIEW COMPARISON:  CT abdomen/pelvis 09/10/2020 FINDINGS: There is mild gaseous distention of the bowel throughout the abdomen without evidence of mechanical obstruction. There is no definite free intraperitoneal air, though evaluation is limited with single supine radiograph. There is no gross organomegaly or abnormal soft tissue calcification. There is degenerative change in the lower lumbar spine. There is a right pleural effusion, incompletely evaluated. IMPRESSION: Mildly gas distended loops of bowel throughout the abdomen without evidence of mechanical obstruction. Electronically Signed   By: Lesia Hausen M.D.   On: 09/13/2020 13:26     Scheduled Meds:  amLODipine  10 mg Oral Daily   feeding supplement  237 mL Oral BID BM   heparin  5,000 Units Subcutaneous Q8H   multivitamin with minerals  1 tablet Oral Daily   nebivolol  20 mg Oral Q1200   pantoprazole  40 mg Oral Daily   sodium chloride flush  3 mL Intravenous Q12H   sodium chloride flush  3 mL Intravenous Q12H   Continuous Infusions:  sodium chloride     piperacillin-tazobactam (ZOSYN)  IV 12.5 mL/hr at 09/13/20 0626     LOS: 3 days   Time spent: 75  Rhetta Mura, MD [facility] To contact the attending provider between 7A-7P or the covering provider during after hours 7P-7A, please log into the web site [email or web address] and access using universal [place] password for that web site. If you do not have the password, please call the hospital operator.  09/13/2020, 3:39 PM

## 2020-09-14 DIAGNOSIS — A419 Sepsis, unspecified organism: Secondary | ICD-10-CM | POA: Diagnosis not present

## 2020-09-14 LAB — C DIFFICILE (CDIFF) QUICK SCRN (NO PCR REFLEX)
C Diff antigen: NEGATIVE
C Diff interpretation: NOT DETECTED
C Diff toxin: NEGATIVE

## 2020-09-14 LAB — COMPREHENSIVE METABOLIC PANEL
ALT: 24 U/L (ref 0–44)
AST: 20 U/L (ref 15–41)
Albumin: 1.6 g/dL — ABNORMAL LOW (ref 3.5–5.0)
Alkaline Phosphatase: 69 U/L (ref 38–126)
Anion gap: 7 (ref 5–15)
BUN: <5 mg/dL — ABNORMAL LOW (ref 8–23)
CO2: 22 mmol/L (ref 22–32)
Calcium: 7.8 mg/dL — ABNORMAL LOW (ref 8.9–10.3)
Chloride: 102 mmol/L (ref 98–111)
Creatinine, Ser: 0.74 mg/dL (ref 0.44–1.00)
GFR, Estimated: >60 mL/min (ref >60)
Glucose, Bld: 117 mg/dL — ABNORMAL HIGH (ref 70–99)
Potassium: 3.4 mmol/L — ABNORMAL LOW (ref 3.5–5.1)
Sodium: 131 mmol/L — ABNORMAL LOW (ref 135–145)
Total Bilirubin: 0.5 mg/dL (ref 0.3–1.2)
Total Protein: 4.9 g/dL — ABNORMAL LOW (ref 6.5–8.1)

## 2020-09-14 LAB — CBC WITH DIFFERENTIAL/PLATELET
Abs Immature Granulocytes: 0.13 10*3/uL — ABNORMAL HIGH (ref 0.00–0.07)
Basophils Absolute: 0 10*3/uL (ref 0.0–0.1)
Basophils Relative: 0 %
Eosinophils Absolute: 0.3 10*3/uL (ref 0.0–0.5)
Eosinophils Relative: 2 %
HCT: 23.2 % — ABNORMAL LOW (ref 36.0–46.0)
Hemoglobin: 7.7 g/dL — ABNORMAL LOW (ref 12.0–15.0)
Immature Granulocytes: 1 %
Lymphocytes Relative: 12 %
Lymphs Abs: 1.6 10*3/uL (ref 0.7–4.0)
MCH: 31.4 pg (ref 26.0–34.0)
MCHC: 33.2 g/dL (ref 30.0–36.0)
MCV: 94.7 fL (ref 80.0–100.0)
Monocytes Absolute: 1 10*3/uL (ref 0.1–1.0)
Monocytes Relative: 7 %
Neutro Abs: 10.8 10*3/uL — ABNORMAL HIGH (ref 1.7–7.7)
Neutrophils Relative %: 78 %
Platelets: 315 10*3/uL (ref 150–400)
RBC: 2.45 MIL/uL — ABNORMAL LOW (ref 3.87–5.11)
RDW: 13.6 % (ref 11.5–15.5)
WBC: 13.8 10*3/uL — ABNORMAL HIGH (ref 4.0–10.5)
nRBC: 0 % (ref 0.0–0.2)

## 2020-09-14 LAB — MAGNESIUM: Magnesium: 1.3 mg/dL — ABNORMAL LOW (ref 1.7–2.4)

## 2020-09-14 LAB — GLUCOSE, CAPILLARY
Glucose-Capillary: 101 mg/dL — ABNORMAL HIGH (ref 70–99)
Glucose-Capillary: 103 mg/dL — ABNORMAL HIGH (ref 70–99)
Glucose-Capillary: 109 mg/dL — ABNORMAL HIGH (ref 70–99)
Glucose-Capillary: 117 mg/dL — ABNORMAL HIGH (ref 70–99)
Glucose-Capillary: 69 mg/dL — ABNORMAL LOW (ref 70–99)
Glucose-Capillary: 74 mg/dL (ref 70–99)
Glucose-Capillary: 97 mg/dL (ref 70–99)

## 2020-09-14 MED ORDER — MAGNESIUM SULFATE 2 GM/50ML IV SOLN
2.0000 g | Freq: Once | INTRAVENOUS | Status: AC
  Administered 2020-09-14: 2 g via INTRAVENOUS
  Filled 2020-09-14: qty 50

## 2020-09-14 NOTE — Plan of Care (Signed)

## 2020-09-14 NOTE — Progress Notes (Addendum)
Subjective: CC: Patient reports no pain in her abdomen at baseline. When tries to drink liquids she develops right lower abdominal cramping that last 1 minute on/off intermittently for 1 hour. No n/v. Still having liquid colostomy output. Mobilizing. Voiding.   Objective: Vital signs in last 24 hours: Temp:  [98.2 F (36.8 C)-99.8 F (37.7 C)] 98.3 F (36.8 C) (09/01 0908) Pulse Rate:  [71-91] 73 (09/01 0908) Resp:  [17-18] 18 (09/01 0908) BP: (104-116)/(61-75) 104/63 (09/01 0908) SpO2:  [97 %-100 %] 100 % (09/01 0908) Last BM Date: 09/13/20  Intake/Output from previous day: 08/31 0701 - 09/01 0700 In: 402.8 [P.O.:360; IV Piggyback:42.8] Out: 1000 [Urine:300; Stool:700] Intake/Output this shift: Total I/O In: -  Out: 1050 [Urine:900; Stool:150]  PE: Gen:  Alert, NAD, pleasant Pulm:  Normal rate and effort Abd: Soft, right sided tenderness without peritonitis, midline wound is clean with dehiscence but no evisceration. Colostomy with air and liquid stool in the bag. Unable to see stoma 2/2 stool. +BS Psych: A&Ox3  Skin: no rashes noted, warm and dry  Lab Results:  Recent Labs    09/13/20 0003 09/14/20 0242  WBC 15.5* 13.8*  HGB 8.3* 7.7*  HCT 24.6* 23.2*  PLT 282 315   BMET Recent Labs    09/13/20 0003 09/14/20 0242  NA 131* 131*  K 3.4* 3.4*  CL 100 102  CO2 24 22  GLUCOSE 101* 117*  BUN 5* <5*  CREATININE 0.74 0.74  CALCIUM 8.0* 7.8*   PT/INR No results for input(s): LABPROT, INR in the last 72 hours. CMP     Component Value Date/Time   NA 131 (L) 09/14/2020 0242   K 3.4 (L) 09/14/2020 0242   CL 102 09/14/2020 0242   CO2 22 09/14/2020 0242   GLUCOSE 117 (H) 09/14/2020 0242   BUN <5 (L) 09/14/2020 0242   CREATININE 0.74 09/14/2020 0242   CREATININE 1.01 (H) 04/04/2020 0000   CALCIUM 7.8 (L) 09/14/2020 0242   PROT 4.9 (L) 09/14/2020 0242   ALBUMIN 1.6 (L) 09/14/2020 0242   AST 20 09/14/2020 0242   ALT 24 09/14/2020 0242   ALKPHOS 69  09/14/2020 0242   BILITOT 0.5 09/14/2020 0242   GFRNONAA >60 09/14/2020 0242   GFRNONAA 57 (L) 04/04/2020 0000   GFRAA 66 04/04/2020 0000   Lipase     Component Value Date/Time   LIPASE 20 08/23/2020 1341    Studies/Results: DG Abd Portable 1V  Result Date: 09/13/2020 CLINICAL DATA:  Abdominal pain, distention, abscess EXAM: PORTABLE ABDOMEN - 1 VIEW COMPARISON:  CT abdomen/pelvis 09/10/2020 FINDINGS: There is mild gaseous distention of the bowel throughout the abdomen without evidence of mechanical obstruction. There is no definite free intraperitoneal air, though evaluation is limited with single supine radiograph. There is no gross organomegaly or abnormal soft tissue calcification. There is degenerative change in the lower lumbar spine. There is a right pleural effusion, incompletely evaluated. IMPRESSION: Mildly gas distended loops of bowel throughout the abdomen without evidence of mechanical obstruction. Electronically Signed   By: Lesia Hausen M.D.   On: 09/13/2020 13:26    Anti-infectives: Anti-infectives (From admission, onward)    Start     Dose/Rate Route Frequency Ordered Stop   09/10/20 1130  piperacillin-tazobactam (ZOSYN) IVPB 3.375 g        3.375 g 12.5 mL/hr over 240 Minutes Intravenous Every 8 hours 09/10/20 1127          Assessment/Plan POD#16 s/p ex lap and Hartman's  procedure with colostomy by AR 8/16 for diverticulitis with abscess - CT 8/28 w/ fluid collection in the RUQ ~5cm in greatest dimension. IR evaluated, not amenable to drainage.  - Cont abx. WBC downtrending - If develops fever or wbc goes back up, consider repeat CT - Poor PO intake but having bowel function. May need to consider TPN if unable to tolerate PO. Added simethicone and ppi 8/31 for symptomatic relief. - Patient with continued liquid output from colostomy. This is slightly improved from yesterday. Consider C. Diff testing if continues. There was no inflammation of the colon noted on CT -  Keep on FLD + shakes - BID WTD - WOCN to follow for continued teaching and would like to assess stoma at next colostomy bag change - IS/pulm toilet - mobilize, PT rec HH    FEN: FLD + shakes (K 3.4 - on IVF w/ K, Mg 1.3 - replace) ID: Zosyn VTE: scds, subq heparin   HTN COVID+   LOS: 4 days    Jacinto Halim , [facility] 09/14/2020, 9:47 AM Please see Amion for pager number during day hours 7:00am-4:30pm

## 2020-09-14 NOTE — Progress Notes (Signed)
PROGRESS NOTE   Anne Aguirre  ATF:[id number] DOB: 1950-11-24 DOA: 09/10/2020 PCP: [name], [name], MD  Brief Narrative:  70 year old white female community dwelling HTN, recent hospitalization 8/10 through 8/26 sigmoid diverticulitis with local abscess-underwent Hartmann procedure 8/16--diagnosed at that time of COVID-19 infection--Rx with remdesivir steroids completed 7 days of IV steroids  Return to [facility] with fever 101 finished antibiotics 8/26--CT scan showed intra abdominal and liver abscess WBC of 30 lactic acid not elevated-circumscribed fluid collection right upper quadrant IR consulted but collection RUQ not amenable for percutaneous drain  Has so far been managed conservatively by general surgery  Hospital-Problem based course  Sepsis on admission secondary sigmoid diverticulitis + abscess status post Hartman's 8/16 Fever 102.7 on 8/28-leukocytosis improving --DDx infectious colitis?  Will test for C.DIFF given pain RLQ with any food CT scan abd-pelvis would be next on testing if CDIFF neg If unable to eat may require TPN Diarrhea See above Recent COVID-19 infection Recent Rx steroids-no hypoxia and asymptomatic DC precautions Hyponatremia hypokalemia Hypokalemia improved-continue monitoring Replace with LR 75 + K 20 meq Magnesium 1.3--give 2 grams today  DVT prophylaxis: Heparin Code Status: Full Family Communication: Discussed with husband [name] at bedside # [phone] Disposition:  Status is: Inpatient  Remains inpatient appropriate because:Hemodynamically unstable and Unsafe d/c plan  Dispo: The patient is from: Home              Anticipated d/c is to:  unClear at this time              Patient currently is not medically stable to d/c.   Difficult to place patient No       Consultants:  Gen surg IR  Procedures: n  Antimicrobials: Zosyn   Subjective:  Less diarrhea but now not eating--pain focal RLQ with some rad-no n/v [place]y stool still No  cp Hasnt been OOB much No overt fever   Objective: Vitals:   09/13/20 2113 09/14/20 0459 09/14/20 0908 09/14/20 1221  BP: 116/70 115/61 104/63 100/68  Pulse: 91 71 73 81  Resp: 17 17 18 19   Temp: 99.8 F (37.7 C) 98.2 F (36.8 C) 98.3 F (36.8 C) 98.2 F (36.8 C)  TempSrc: Oral Oral Oral Oral  SpO2: 97% 98% 100% 100%  Weight:      Height:        Intake/Output Summary (Last 24 hours) at 09/14/2020 1232 Last data filed at 09/14/2020 1223 Gross per 24 hour  Intake 720 ml  Output 1475 ml  Net -755 ml    Filed Weights   09/10/20 0901 09/10/20 0927  Weight: 72 kg 69.5 kg    Examination:  EOMI NCAT slightly frail S1-S2 no MRG RRR Abdomen slightly distended  ostomy filled with liquid water brown stool Tender more so over RLQ No lower extremity edema no rash ROM intact, power 5/5  Data Reviewed: personally reviewed   CBC    Component Value Date/Time   WBC 13.8 (H) 09/14/2020 0242   RBC 2.45 (L) 09/14/2020 0242   HGB 7.7 (L) 09/14/2020 0242   HCT 23.2 (L) 09/14/2020 0242   PLT 315 09/14/2020 0242   MCV 94.7 09/14/2020 0242   MCH 31.4 09/14/2020 0242   MCHC 33.2 09/14/2020 0242   RDW 13.6 09/14/2020 0242   LYMPHSABS 1.6 09/14/2020 0242   MONOABS 1.0 09/14/2020 0242   EOSABS 0.3 09/14/2020 0242   BASOSABS 0.0 09/14/2020 0242   CMP Latest Ref Rng & Units 09/14/2020 09/13/2020 09/12/2020  Glucose 70 -  99 mg/dL 353(G) 992(E) 268(T)  BUN 8 - 23 mg/dL <4(H) 5(L) 7(L)  Creatinine 0.44 - 1.00 mg/dL 9.62 2.29 7.98  Sodium 135 - 145 mmol/L 131(L) 131(L) 130(L)  Potassium 3.5 - 5.1 mmol/L 3.4(L) 3.4(L) 2.6(LL)  Chloride 98 - 111 mmol/L 102 100 99  CO2 22 - 32 mmol/L 22 24 23   Calcium 8.9 - 10.3 mg/dL 7.8(L) 8.0(L) 7.9(L)  Total Protein 6.5 - 8.1 g/dL 4.9(L) - -  Total Bilirubin 0.3 - 1.2 mg/dL 0.5 - -  Alkaline Phos 38 - 126 U/L 69 - -  AST 15 - 41 U/L 20 - -  ALT 0 - 44 U/L 24 - -     Radiology Studies: DG Abd Portable 1V  Result Date: 09/13/2020 CLINICAL DATA:   Abdominal pain, distention, abscess EXAM: PORTABLE ABDOMEN - 1 VIEW COMPARISON:  CT abdomen/pelvis 09/10/2020 FINDINGS: There is mild gaseous distention of the bowel throughout the abdomen without evidence of mechanical obstruction. There is no definite free intraperitoneal air, though evaluation is limited with single supine radiograph. There is no gross organomegaly or abnormal soft tissue calcification. There is degenerative change in the lower lumbar spine. There is a right pleural effusion, incompletely evaluated. IMPRESSION: Mildly gas distended loops of bowel throughout the abdomen without evidence of mechanical obstruction. Electronically Signed   By: 09/12/2020 M.D.   On: 09/13/2020 13:26     Scheduled Meds:  amLODipine  10 mg Oral Daily   feeding supplement  237 mL Oral BID BM   heparin  5,000 Units Subcutaneous Q8H   multivitamin with minerals  1 tablet Oral Daily   nebivolol  20 mg Oral Q1200   pantoprazole  40 mg Oral Daily   sodium chloride flush  3 mL Intravenous Q12H   sodium chloride flush  3 mL Intravenous Q12H   Continuous Infusions:  sodium chloride     lactated ringers with kcl 75 mL/hr at 09/13/20 1933   magnesium sulfate bolus IVPB 2 g (09/14/20 1204)   piperacillin-tazobactam (ZOSYN)  IV 3.375 g (09/14/20 0623)     LOS: 4 days   Time spent: 25  30, MD [facility] To contact the attending provider between 7A-7P or the covering provider during after hours 7P-7A, please log into the web site [email or web address] and access using universal  password for that web site. If you do not have the password, please call the hospital operator.  09/14/2020, 12:32 PM

## 2020-09-14 NOTE — Consult Note (Addendum)
WOC Nurse ostomy consult note Surgical team following for assessment and plan of care for abd wound.   Pt is familiar to [facility] team from recent visit ; she had colostomy surgery performed on 8/16 and had several teaching sessions performed.  She was discharged and has returned.  She is on isolation for Covid.   Pt states she was able to empty independently at home but her husband has been applying the pouch, since she has been feeling poorly since surgery. Stoma type/location: The stoma's outer layer has sloughed off, leaving a 1 1/4 inch oval "opening" in the skin, below skin level and with yellow slough in the center, painful to touch.  Surrounding skin is intact. Mod amt liquid green-brown stool emptied.  Pt has been using convex pouches, barrier rings, and a belt prior to readmission.  Stoma opening is now small enough to downsize to the next smaller size of flexible convex pouch.  Ostomy pouching: Applied barrier ring and 2 piece convex pouching system, but ordered one piece flexible convex pouches for bedside nurse use.  Education provided: Discussed plan of care and stoma appearance with patient, her husband is not present today. Ordered 4 sets of supplies to the room for staff nurse use:  Use Supplies: barrier ring, Hart [place] # [id number], convex pouch [place] # [phone], belt Hart [place] # [id number] if needed Enrolled patient in [employer] DC program: Yes, previously Please re-consult if further assistance is needed.  Thank-you,  Cammie Mcgee MSN, RN, CWOCN, [place], CNS [phone]

## 2020-09-15 ENCOUNTER — Ambulatory Visit (HOSPITAL_BASED_OUTPATIENT_CLINIC_OR_DEPARTMENT_OTHER): Payer: Medicare PPO | Admitting: Family Medicine

## 2020-09-15 ENCOUNTER — Other Ambulatory Visit: Payer: Self-pay | Admitting: Infectious Diseases

## 2020-09-15 DIAGNOSIS — Z1619 Resistance to other specified beta lactam antibiotics: Secondary | ICD-10-CM

## 2020-09-15 DIAGNOSIS — A419 Sepsis, unspecified organism: Secondary | ICD-10-CM | POA: Diagnosis not present

## 2020-09-15 DIAGNOSIS — A498 Other bacterial infections of unspecified site: Secondary | ICD-10-CM

## 2020-09-15 LAB — CARBAPENEM RESISTANCE PANEL
Carba Resistance IMP Gene: NOT DETECTED
Carba Resistance KPC Gene: DETECTED — AB
Carba Resistance NDM Gene: NOT DETECTED
Carba Resistance OXA48 Gene: DETECTED — AB
Carba Resistance VIM Gene: NOT DETECTED

## 2020-09-15 LAB — CBC WITH DIFFERENTIAL/PLATELET
Abs Immature Granulocytes: 0.08 10*3/uL — ABNORMAL HIGH (ref 0.00–0.07)
Basophils Absolute: 0.1 10*3/uL (ref 0.0–0.1)
Basophils Relative: 1 %
Eosinophils Absolute: 0.3 10*3/uL (ref 0.0–0.5)
Eosinophils Relative: 3 %
HCT: 24.2 % — ABNORMAL LOW (ref 36.0–46.0)
Hemoglobin: 7.7 g/dL — ABNORMAL LOW (ref 12.0–15.0)
Immature Granulocytes: 1 %
Lymphocytes Relative: 17 %
Lymphs Abs: 1.8 10*3/uL (ref 0.7–4.0)
MCH: 30.9 pg (ref 26.0–34.0)
MCHC: 31.8 g/dL (ref 30.0–36.0)
MCV: 97.2 fL (ref 80.0–100.0)
Monocytes Absolute: 0.8 10*3/uL (ref 0.1–1.0)
Monocytes Relative: 8 %
Neutro Abs: 7.8 10*3/uL — ABNORMAL HIGH (ref 1.7–7.7)
Neutrophils Relative %: 70 %
Platelets: 359 10*3/uL (ref 150–400)
RBC: 2.49 MIL/uL — ABNORMAL LOW (ref 3.87–5.11)
RDW: 13.8 % (ref 11.5–15.5)
WBC: 10.9 10*3/uL — ABNORMAL HIGH (ref 4.0–10.5)
nRBC: 0 % (ref 0.0–0.2)

## 2020-09-15 LAB — COMPREHENSIVE METABOLIC PANEL
ALT: 23 U/L (ref 0–44)
AST: 24 U/L (ref 15–41)
Albumin: 1.7 g/dL — ABNORMAL LOW (ref 3.5–5.0)
Alkaline Phosphatase: 98 U/L (ref 38–126)
Anion gap: 8 (ref 5–15)
BUN: 5 mg/dL — ABNORMAL LOW (ref 8–23)
CO2: 21 mmol/L — ABNORMAL LOW (ref 22–32)
Calcium: 8.1 mg/dL — ABNORMAL LOW (ref 8.9–10.3)
Chloride: 106 mmol/L (ref 98–111)
Creatinine, Ser: 0.82 mg/dL (ref 0.44–1.00)
GFR, Estimated: >60 mL/min (ref >60)
Glucose, Bld: 99 mg/dL (ref 70–99)
Potassium: 3.5 mmol/L (ref 3.5–5.1)
Sodium: 135 mmol/L (ref 135–145)
Total Bilirubin: 0.3 mg/dL (ref 0.3–1.2)
Total Protein: 5.1 g/dL — ABNORMAL LOW (ref 6.5–8.1)

## 2020-09-15 LAB — CULTURE, BLOOD (ROUTINE X 2)
Culture: NO GROWTH
Culture: NO GROWTH
Special Requests: ADEQUATE

## 2020-09-15 LAB — GLUCOSE, CAPILLARY
Glucose-Capillary: 101 mg/dL — ABNORMAL HIGH (ref 70–99)
Glucose-Capillary: 108 mg/dL — ABNORMAL HIGH (ref 70–99)
Glucose-Capillary: 109 mg/dL — ABNORMAL HIGH (ref 70–99)
Glucose-Capillary: 113 mg/dL — ABNORMAL HIGH (ref 70–99)
Glucose-Capillary: 120 mg/dL — ABNORMAL HIGH (ref 70–99)
Glucose-Capillary: 87 mg/dL (ref 70–99)

## 2020-09-15 LAB — MAGNESIUM: Magnesium: 1.7 mg/dL (ref 1.7–2.4)

## 2020-09-15 MED ORDER — MAGNESIUM SULFATE IN D5W 1-5 GM/100ML-% IV SOLN
1.0000 g | Freq: Once | INTRAVENOUS | Status: AC
  Administered 2020-09-15: 1 g via INTRAVENOUS
  Filled 2020-09-15: qty 100

## 2020-09-15 NOTE — Progress Notes (Signed)
PROGRESS NOTE   Anne Aguirre  ZJI:[id number] DOB: Jun 12, 1950 DOA: 09/10/2020 PCP: [name], [name], MD  Brief Narrative:  70 year old white female community dwelling HTN, recent hospitalization 8/10 through 8/26 sigmoid diverticulitis with local abscess-underwent Hartmann procedure 8/16--diagnosed at that time of COVID-19 infection--Rx with remdesivir steroids completed 7 days of IV steroids  Return to [facility] with fever 101 finished antibiotics 8/26--CT scan showed intra abdominal and liver abscess WBC of 30 lactic acid not elevated-circumscribed fluid collection right upper quadrant IR consulted but collection RUQ not amenable for percutaneous drain  Has so far been managed conservatively by general surgery--she has been tested for C. difficile and this is negative she continues to have right lower quadrant pain and we are contemplating whether to get a CT scan but have held on that because her white count has improved significantly although she does have pain  Hospital-Problem based course  Sepsis on admission secondary sigmoid diverticulitis + abscess status post Hartman's 8/16 Fever 102.7 on 8/28-leukocytosis improving steadily--General surgery electing to hold on CT scan abd-pelvis unless significant leukocytosis or other symptoms--if severe pain when she takes a diet however however I will obtain a repeat CT Continues on Zosyn IV at this time CDIFF tested on 9/1 and is neg Graduated diet and see how she does over the course of today Diarrhea See above Recent COVID-19 infection Recent Rx steroids-no hypoxia and asymptomatic DC precautions Hyponatremia hypokalemia Hypokalemia improved-continue monitoring Replace with LR 75 + K 20 meq and continue the same Magnesium 1.7--replace again with 1 grams today  DVT prophylaxis: Heparin Code Status: Full Family Communication: Discussed with husband [name] at bedside daily on 8/31 and 9/1, his phone number = [phone] Disposition:   Status is: Inpatient  Remains inpatient appropriate because:Hemodynamically unstable and Unsafe d/c plan  Dispo: The patient is from: Home              Anticipated d/c is to:  unClear at this time              Patient currently is not medically stable to d/c.   Difficult to place patient No       Consultants:  Gen surg IR  Procedures: n  Antimicrobials: Zosyn   Subjective:  Continues with mild right lower quadrant pain which is improved from prior has not eaten since 8 PM last night for fear of discomfort Tolerated some apple juice this morning without overt event Slept well overnight   Objective: Vitals:   09/14/20 1900 09/14/20 2008 09/15/20 0405 09/15/20 0904  BP: 104/65 102/67 103/67 122/77  Pulse: 92 94 80 96  Resp: 18 17 17 18   Temp: 99.1 F (37.3 C) 98.9 F (37.2 C) 98.5 F (36.9 C) 98.5 F (36.9 C)  TempSrc: Oral Oral Oral Oral  SpO2: 97% 98% 97% 99%  Weight:      Height:        Intake/Output Summary (Last 24 hours) at 09/15/2020 0944 Last data filed at 09/15/2020 0900 Gross per 24 hour  Intake 3411.83 ml  Output 975 ml  Net 2436.83 ml    Filed Weights   09/10/20 0901 09/10/20 0927  Weight: 72 kg 69.5 kg    Examination:  Coherent pleasant white female no distress S1-S2 no MRG RRR Abdomen slightly distended, postop wounds noted ostomy filled with liquid water brown stool Remains somewhat tender over RLQ No lower extremity edema no rash ROM intact, power 5/5  Data Reviewed: personally reviewed   CBC    Component  Value Date/Time   WBC 10.9 (H) 09/15/2020 0118   RBC 2.49 (L) 09/15/2020 0118   HGB 7.7 (L) 09/15/2020 0118   HCT 24.2 (L) 09/15/2020 0118   PLT 359 09/15/2020 0118   MCV 97.2 09/15/2020 0118   MCH 30.9 09/15/2020 0118   MCHC 31.8 09/15/2020 0118   RDW 13.8 09/15/2020 0118   LYMPHSABS 1.8 09/15/2020 0118   MONOABS 0.8 09/15/2020 0118   EOSABS 0.3 09/15/2020 0118   BASOSABS 0.1 09/15/2020 0118   CMP Latest Ref Rng &  Units 09/15/2020 09/14/2020 09/13/2020  Glucose 70 - 99 mg/dL 99 856(D) 149(F)  BUN 8 - 23 mg/dL 5(L) <0(Y) 5(L)  Creatinine 0.44 - 1.00 mg/dL 6.37 8.58 8.50  Sodium 135 - 145 mmol/L 135 131(L) 131(L)  Potassium 3.5 - 5.1 mmol/L 3.5 3.4(L) 3.4(L)  Chloride 98 - 111 mmol/L 106 102 100  CO2 22 - 32 mmol/L 21(L) 22 24  Calcium 8.9 - 10.3 mg/dL 8.1(L) 7.8(L) 8.0(L)  Total Protein 6.5 - 8.1 g/dL 5.1(L) 4.9(L) -  Total Bilirubin 0.3 - 1.2 mg/dL 0.3 0.5 -  Alkaline Phos 38 - 126 U/L 98 69 -  AST 15 - 41 U/L 24 20 -  ALT 0 - 44 U/L 23 24 -     Radiology Studies: No results found.   Scheduled Meds:  amLODipine  10 mg Oral Daily   feeding supplement  237 mL Oral BID BM   heparin  5,000 Units Subcutaneous Q8H   multivitamin with minerals  1 tablet Oral Daily   nebivolol  20 mg Oral Q1200   pantoprazole  40 mg Oral Daily   sodium chloride flush  3 mL Intravenous Q12H   sodium chloride flush  3 mL Intravenous Q12H   Continuous Infusions:  sodium chloride     lactated ringers with kcl 75 mL/hr at 09/15/20 0607   piperacillin-tazobactam (ZOSYN)  IV 12.5 mL/hr at 09/15/20 0607     LOS: 5 days   Time spent: 67  Rhetta Mura, MD [facility] To contact the attending provider between 7A-7P or the covering provider during after hours 7P-7A, please log into the web site [email or web address] and access using universal [place] password for that web site. If you do not have the password, please call the hospital operator.  09/15/2020, 9:44 AM

## 2020-09-15 NOTE — Progress Notes (Signed)
Physical Therapy Evaluation Patient Details Name: Anne Aguirre MRN: [id number] DOB: 11/19/1950 Today's Date: 09/15/2020   History of Present Illness  Pt is a 70yo female presenting to [facility] ED on 8/28 with stomach pain & fever; found to be septic due to intraabdominal abscess. Recent admit 8/10-8/26 with similar presentation; during that admission received exploratory laparotomy 8/16 with colostomy secondary to perforated diverticulitis.  PMH: HTN, hyponatremia, +COVID on 8/19.   Clinical Impression  Pt presents with the impairments above and problems listed below. Required supervision for bed mobility and min guard for transfers and ambulation in room with RW. Provided HEP and verbally reviewed with pt and husband. Educated pt on abdominal splinting during coughing/sneezing, pt verbalized understanding. Recommending HHPT upon discharge to address current limitations. We will continue to follow the pt acutely to promote independence with functional mobility.   Medbridge access code: [facility]    Follow Up Recommendations Home health PT;Supervision for mobility/OOB    Equipment Recommendations  Hospital bed    Recommendations for Other Services       Precautions / Restrictions Precautions Precautions: Fall Precaution Comments: colostomy LUQ Restrictions Weight Bearing Restrictions: No      Mobility  Bed Mobility Overal bed mobility: Needs Assistance Bed Mobility: Rolling;Sidelying to Sit Rolling: Supervision Sidelying to sit: Supervision       General bed mobility comments: Pt supervision for safety only; able to demonstrate good logroll technique.    Transfers Overall transfer level: Needs assistance Equipment used: Rolling walker (2 wheeled) Transfers: Sit to/from Stand Sit to Stand: Min guard         General transfer comment: Pt min gaurd for safety only, minimal cues for hand placement  Ambulation/Gait Ambulation/Gait assistance: Min guard Gait Distance (Feet): 15  Feet Assistive device: Rolling walker (2 wheeled) Gait Pattern/deviations: Shuffle;Step-through pattern;Decreased stride length;Trunk flexed Gait velocity: decreased   General Gait Details: Pt required min guard for safety only. Demonstrated decreased stride length in a shuffling gait, flexed posture. Minimal cues for upright posture.  Stairs            Wheelchair Mobility    Modified Rankin (Stroke Patients Only)       Balance Overall balance assessment: Needs assistance Sitting-balance support: No upper extremity supported;Feet supported Sitting balance-Leahy Scale: Fair     Standing balance support: Bilateral upper extremity supported;During functional activity Standing balance-Leahy Scale: Poor Standing balance comment: Pt reliant on BUE on RW during static standing and functional ambulation.                             Pertinent Vitals/Pain Pain Assessment: No/denies pain    Home Living Family/patient expects to be discharged to:: Private residence Living Arrangements: Spouse/significant other (Husband Brett Canales) Available Help at Discharge: Family;Available 24 hours/day Type of Home: House Home Access: Ramped entrance     Home Layout: One level Home Equipment: Grab bars - tub/shower;Grab bars - toilet;Walker - 2 wheels;Bedside commode;Wheelchair - manual Additional Comments: Requesting hospital bed, were in process of [facility] service setup PTA.    Prior Function Level of Independence: Independent with assistive device(s)         Comments: Independent with gait using RW for short distances, transfering to [facility].     Hand Dominance   Dominant Hand: Right    Extremity/Trunk Assessment   Upper Extremity Assessment Upper Extremity Assessment: Generalized weakness    Lower Extremity Assessment Lower Extremity Assessment: RLE deficits/detail;LLE deficits/detail RLE Deficits / Details:  Strength grossly 3+/5 RLE Sensation: WNL LLE Deficits /  Details: Strength grossly 3+/5 LLE Sensation: WNL    Cervical / Trunk Assessment Cervical / Trunk Assessment: Other exceptions Cervical / Trunk Exceptions: Recent abdominal surgery with colostomy.  Communication   Communication: No difficulties  Cognition Arousal/Alertness: Awake/alert Behavior During Therapy: WFL for tasks assessed/performed Overall Cognitive Status: Within Functional Limits for tasks assessed                                        General Comments      Exercises Other Exercises Other Exercises: Gave medbridge HEP [facility]   Assessment/Plan    PT Assessment Patient needs continued PT services  PT Problem List Decreased strength;Decreased activity tolerance;Decreased balance;Decreased mobility;Decreased knowledge of use of DME;Cardiopulmonary status limiting activity;Pain       PT Treatment Interventions DME instruction;Gait training;Functional mobility training;Therapeutic activities;Therapeutic exercise;Balance training;Patient/family education    PT Goals (Current goals can be found in the Care Plan section)  Acute Rehab PT Goals Patient Stated Goal: To feel stronger PT Goal Formulation: With patient/family Time For Goal Achievement: 09/29/20 Potential to Achieve Goals: Good    Frequency Min 3X/week   Barriers to discharge        Co-evaluation               AM-PAC PT "6 Clicks" Mobility  Outcome Measure Help needed turning from your back to your side while in a flat bed without using bedrails?: A Little Help needed moving from lying on your back to sitting on the side of a flat bed without using bedrails?: A Little Help needed moving to and from a bed to a chair (including a wheelchair)?: A Little Help needed standing up from a chair using your arms (e.g., wheelchair or bedside chair)?: A Little Help needed to walk in hospital room?: A Little Help needed climbing 3-5 steps with a railing? : A Lot 6 Click Score: 17     End of Session Equipment Utilized During Treatment: Gait belt Activity Tolerance: Patient limited by fatigue Patient left: with call bell/phone within reach;with chair alarm set;in chair;with family/visitor present (Husband Brett Canales) Nurse Communication: Mobility status PT Visit Diagnosis: Unsteadiness on feet (R26.81);Pain;Muscle weakness (generalized) (M62.81) Pain - part of body:  (Abdomen)    Time: 1528-1600 PT Time Calculation (min) (ACUTE ONLY): 32 min   Charges:   PT Evaluation $PT Eval Moderate Complexity: 1 Mod PT Treatments $Therapeutic Activity: 8-22 mins        Johnn Hai, SPT Johnn Hai 09/15/2020, 4:53 PM

## 2020-09-15 NOTE — Plan of Care (Signed)

## 2020-09-15 NOTE — Care Management (Signed)
    Durable Medical Equipment  (From admission, onward)           Start     Ordered   09/15/20 1611  For home use only DME Hospital bed  Once       Comments: Call Gayla Benn [phone] for delivery Thanks  Question Answer Comment  Length of Need Lifetime   Patient has (list medical condition): Sepsis on admission secondary sigmoid diverticulitis + abscess status post Hartman's 8/16   The above medical condition requires: Patient requires the ability to reposition frequently   Bed type Semi-electric   Support Surface: Gel Overlay      09/15/20 1611

## 2020-09-15 NOTE — Progress Notes (Signed)
Subjective: CC: Patient with continued intermittent pain after oral intake that lasts for ~1 hour before ceasing. No current abdominal pain. Denies n/v. Having ostomy output. Voiding. Mobilizing.   Objective: Vital signs in last 24 hours: Temp:  [98.2 F (36.8 C)-99.1 F (37.3 C)] 98.5 F (36.9 C) (09/02 0405) Pulse Rate:  [73-94] 80 (09/02 0405) Resp:  [17-19] 17 (09/02 0405) BP: (100-104)/(63-68) 103/67 (09/02 0405) SpO2:  [97 %-100 %] 97 % (09/02 0405) Last BM Date: 09/14/20  Intake/Output from previous day: 09/01 0701 - 09/02 0700 In: 3411.8 [P.O.:960; I.V.:2187.6; IV Piggyback:264.2] Out: 1425 [Urine:1100; Stool:325] Intake/Output this shift: No intake/output data recorded.  PE: Gen:  Alert, NAD, pleasant Pulm:  Normal rate and effort Abd: Soft, right sided tenderness without peritonitis, midline wound is clean with dehiscence but no evisceration. Colostomy with air and liquid stool in the bag. Unable to see stoma 2/2 stool. +BS. WOCN assessed stoma yesterday Psych: A&Ox3  Skin: no rashes noted, warm and dry  Lab Results:  Recent Labs    09/14/20 0242 09/15/20 0118  WBC 13.8* 10.9*  HGB 7.7* 7.7*  HCT 23.2* 24.2*  PLT 315 359   BMET Recent Labs    09/14/20 0242 09/15/20 0118  NA 131* 135  K 3.4* 3.5  CL 102 106  CO2 22 21*  GLUCOSE 117* 99  BUN <5* 5*  CREATININE 0.74 0.82  CALCIUM 7.8* 8.1*   PT/INR No results for input(s): LABPROT, INR in the last 72 hours. CMP     Component Value Date/Time   NA 135 09/15/2020 0118   K 3.5 09/15/2020 0118   CL 106 09/15/2020 0118   CO2 21 (L) 09/15/2020 0118   GLUCOSE 99 09/15/2020 0118   BUN 5 (L) 09/15/2020 0118   CREATININE 0.82 09/15/2020 0118   CREATININE 1.01 (H) 04/04/2020 0000   CALCIUM 8.1 (L) 09/15/2020 0118   PROT 5.1 (L) 09/15/2020 0118   ALBUMIN 1.7 (L) 09/15/2020 0118   AST 24 09/15/2020 0118   ALT 23 09/15/2020 0118   ALKPHOS 98 09/15/2020 0118   BILITOT 0.3 09/15/2020 0118    GFRNONAA >60 09/15/2020 0118   GFRNONAA 57 (L) 04/04/2020 0000   GFRAA 66 04/04/2020 0000   Lipase     Component Value Date/Time   LIPASE 20 08/23/2020 1341    Studies/Results: DG Abd Portable 1V  Result Date: 09/13/2020 CLINICAL DATA:  Abdominal pain, distention, abscess EXAM: PORTABLE ABDOMEN - 1 VIEW COMPARISON:  CT abdomen/pelvis 09/10/2020 FINDINGS: There is mild gaseous distention of the bowel throughout the abdomen without evidence of mechanical obstruction. There is no definite free intraperitoneal air, though evaluation is limited with single supine radiograph. There is no gross organomegaly or abnormal soft tissue calcification. There is degenerative change in the lower lumbar spine. There is a right pleural effusion, incompletely evaluated. IMPRESSION: Mildly gas distended loops of bowel throughout the abdomen without evidence of mechanical obstruction. Electronically Signed   By: Lesia Hausen M.D.   On: 09/13/2020 13:26    Anti-infectives: Anti-infectives (From admission, onward)    Start     Dose/Rate Route Frequency Ordered Stop   09/10/20 1130  piperacillin-tazobactam (ZOSYN) IVPB 3.375 g        3.375 g 12.5 mL/hr over 240 Minutes Intravenous Every 8 hours 09/10/20 1127          Assessment/Plan POD#17 s/p ex lap and Hartman's procedure with colostomy by AR 8/16 for diverticulitis with abscess - CT 8/28 w/  fluid collection in the RUQ ~5cm in greatest dimension. IR evaluated, not amenable to drainage.  - Cont abx. WBC downtrending, afebrile - If develops fever or wbc goes back up, consider repeat CT - Poor PO intake but having bowel function. Added simethicone and ppi 8/31 for symptomatic relief. Patient reports she is going to try and eat more today - C. Diff negative. Output downtrending - BID WTD - WOCN to follow for continued teaching and would like to assess stoma at next colostomy bag change - IS/pulm toilet - mobilize, PT rec HH    FEN: Reg + shakes  ID:  Zosyn VTE: scds, subq heparin   HTN COVID+   LOS: 5 days    Jacinto Halim , [facility] 09/15/2020, 8:25 AM Please see Amion for pager number during day hours 7:00am-4:30pm

## 2020-09-15 NOTE — TOC Initial Note (Signed)
Transition of Care [facility]) - Initial/Assessment Note    Patient Details  Name: Anne Aguirre MRN: [id number] Date of Birth: 1950-11-28  Transition of Care [facility]) CM/SW Contact:    Kingsley Plan, RN Phone Number: 09/15/2020, 4:19 PM  Clinical Narrative:                 Patient from home with husband . Was active with [facility] RN,PT,OT , they would like to continue with [facility] . Husband wants his number to be contact number [phone] . Orders entered and [place] with [facility] aware and his husbands number.   Husband requesting hospital bed and he is contact person for delivery. Bed ordered with [place] with [facility]. Beola Cord has husband number   Expected Discharge Plan: Home w Home Health Services Barriers to Discharge: Continued Medical Work up   Patient Goals and CMS Choice   CMS Medicare.gov Compare Post Acute Care list provided to:: Patient Choice offered to / list presented to : Spouse, Patient  Expected Discharge Plan and Services Expected Discharge Plan: Home w Home Health Services   Discharge Planning Services: CM Consult Post Acute Care Choice: Home Health Living arrangements for the past 2 months: Single Family Home                 DME Arranged: Hospital bed DME Agency: [facility] Date DME Agency Contacted: 09/15/20 Time DME Agency Contacted: [phone] Representative spoke with at DME Agency: Beola Cord HH Arranged: PT, OT, RN HH Agency: [facility] Date [facility] Agency Contacted: 09/15/20 Time HH Agency Contacted: 1618 Representative spoke with at [facility] Agency: Kandee Keen  Prior Living Arrangements/Services Living arrangements for the past 2 months: Single Family Home Lives with:: Spouse Patient language and need for interpreter reviewed:: Yes Do you feel safe going back to the place where you live?: Yes      Need for Family Participation in Patient Care: Yes (Comment) Care giver support system in place?: Yes (comment) Current home services: DME Criminal  Activity/Legal Involvement Pertinent to Current Situation/Hospitalization: No - Comment as needed  Activities of Daily Living      Permission Sought/Granted   Permission granted to share information with : Yes, Verbal Permission Granted  Share Information with NAME: Angi Goodell  Permission granted to share info w AGENCY: [facility] , [facility]        Emotional Assessment         Alcohol / Substance Use: Not Applicable Psych Involvement: No (comment)  Admission diagnosis:  Hepatic abscess [K75.0] Sepsis (HCC) [A41.9] Patient Active Problem List   Diagnosis Date Noted   Sepsis Due to Intraabdomonal Abscess 09/10/2020   COVID-19 virus infection ---- +ve on 09/01/2020 09/10/2020   Post-op intraabdominal/Liver abscess-Had Hartmann's with Colostomy on 08/29/2020 for Sigmoid Diverticulits 09/10/2020   Acute diverticulitis 08/23/2020   Acute renal failure superimposed on stage 3a chronic kidney disease (HCC) 08/23/2020   Hyponatremia 08/23/2020   Hyperbilirubinemia 08/23/2020   Hypercalcemia 08/23/2020   Essential hypertension, benign 10/01/2018   HLD (hyperlipidemia) 10/01/2018   Vitamin D deficiency disease 10/01/2018   Osteoarthritis 10/01/2018   PCP:  [name], [name], MD Pharmacy:   [facility] [address] [place] - [address] [place] [address] Phone: [phone] Fax: [phone]  [facility] [place], [place] - [address]  [place] [address] Phone: [phone] Fax: [phone]  [facility] [address] [place] [address]  Phone: [phone] Fax: [phone]     Social Determinants of Health (SDOH) Interventions    Readmission Risk Interventions No flowsheet data found.

## 2020-09-16 ENCOUNTER — Inpatient Hospital Stay (HOSPITAL_COMMUNITY): Payer: Medicare PPO

## 2020-09-16 DIAGNOSIS — A419 Sepsis, unspecified organism: Secondary | ICD-10-CM | POA: Diagnosis not present

## 2020-09-16 DIAGNOSIS — E871 Hypo-osmolality and hyponatremia: Secondary | ICD-10-CM | POA: Diagnosis not present

## 2020-09-16 DIAGNOSIS — I10 Essential (primary) hypertension: Secondary | ICD-10-CM | POA: Diagnosis not present

## 2020-09-16 DIAGNOSIS — T8143XA Infection following a procedure, organ and space surgical site, initial encounter: Secondary | ICD-10-CM | POA: Diagnosis not present

## 2020-09-16 LAB — COMPREHENSIVE METABOLIC PANEL
ALT: 25 U/L (ref 0–44)
AST: 30 U/L (ref 15–41)
Albumin: 1.6 g/dL — ABNORMAL LOW (ref 3.5–5.0)
Alkaline Phosphatase: 147 U/L — ABNORMAL HIGH (ref 38–126)
Anion gap: 5 (ref 5–15)
BUN: <5 mg/dL — ABNORMAL LOW (ref 8–23)
CO2: 24 mmol/L (ref 22–32)
Calcium: 7.8 mg/dL — ABNORMAL LOW (ref 8.9–10.3)
Chloride: 107 mmol/L (ref 98–111)
Creatinine, Ser: 0.67 mg/dL (ref 0.44–1.00)
GFR, Estimated: >60 mL/min (ref >60)
Glucose, Bld: 93 mg/dL (ref 70–99)
Potassium: 3.9 mmol/L (ref 3.5–5.1)
Sodium: 136 mmol/L (ref 135–145)
Total Bilirubin: 0.3 mg/dL (ref 0.3–1.2)
Total Protein: 4.8 g/dL — ABNORMAL LOW (ref 6.5–8.1)

## 2020-09-16 LAB — CBC WITH DIFFERENTIAL/PLATELET
Abs Immature Granulocytes: 0.11 10*3/uL — ABNORMAL HIGH (ref 0.00–0.07)
Basophils Absolute: 0 10*3/uL (ref 0.0–0.1)
Basophils Relative: 0 %
Eosinophils Absolute: 0.4 10*3/uL (ref 0.0–0.5)
Eosinophils Relative: 3 %
HCT: 22 % — ABNORMAL LOW (ref 36.0–46.0)
Hemoglobin: 7.1 g/dL — ABNORMAL LOW (ref 12.0–15.0)
Immature Granulocytes: 1 %
Lymphocytes Relative: 18 %
Lymphs Abs: 2 10*3/uL (ref 0.7–4.0)
MCH: 31.1 pg (ref 26.0–34.0)
MCHC: 32.3 g/dL (ref 30.0–36.0)
MCV: 96.5 fL (ref 80.0–100.0)
Monocytes Absolute: 0.9 10*3/uL (ref 0.1–1.0)
Monocytes Relative: 8 %
Neutro Abs: 7.8 10*3/uL — ABNORMAL HIGH (ref 1.7–7.7)
Neutrophils Relative %: 70 %
Platelets: 367 10*3/uL (ref 150–400)
RBC: 2.28 MIL/uL — ABNORMAL LOW (ref 3.87–5.11)
RDW: 13.9 % (ref 11.5–15.5)
WBC: 11.1 10*3/uL — ABNORMAL HIGH (ref 4.0–10.5)
nRBC: 0 % (ref 0.0–0.2)

## 2020-09-16 LAB — GLUCOSE, CAPILLARY
Glucose-Capillary: 93 mg/dL (ref 70–99)
Glucose-Capillary: 79 mg/dL (ref 70–99)
Glucose-Capillary: 85 mg/dL (ref 70–99)
Glucose-Capillary: 110 mg/dL — ABNORMAL HIGH (ref 70–99)

## 2020-09-16 MED ORDER — ENOXAPARIN SODIUM 40 MG/0.4ML IJ SOSY
40.0000 mg | PREFILLED_SYRINGE | INTRAMUSCULAR | Status: DC
  Filled 2020-09-16: qty 0.4

## 2020-09-16 MED ORDER — TRAZODONE HCL 50 MG PO TABS
50.0000 mg | ORAL_TABLET | Freq: Once | ORAL | Status: AC
  Administered 2020-09-16: 50 mg via ORAL
  Filled 2020-09-16: qty 1

## 2020-09-16 MED ORDER — ENOXAPARIN SODIUM 40 MG/0.4ML IJ SOSY
40.0000 mg | PREFILLED_SYRINGE | INTRAMUSCULAR | Status: DC
  Administered 2020-09-17 – 2020-09-18 (×2): 40 mg via SUBCUTANEOUS
  Filled 2020-09-16: qty 0.4

## 2020-09-16 MED ORDER — IOHEXOL 350 MG/ML SOLN
75.0000 mL | Freq: Once | INTRAVENOUS | Status: AC | PRN
  Administered 2020-09-16: 75 mL via INTRAVENOUS

## 2020-09-16 NOTE — Progress Notes (Signed)
Patient ID: Anne Aguirre, female   DOB: [birth year], 70 y.o.   MRN: [id number] [facility] Surgery Progress Note:   * No surgery found *  Subjective: Mental status is clear and alert.  Complaints right upper quadrant discomfort with eating and right sided bloating. Objective: Vital signs in last 24 hours: Temp:  [98.3 F (36.8 C)-99.3 F (37.4 C)] 98.3 F (36.8 C) (09/03 0751) Pulse Rate:  [85-96] 93 (09/03 0751) Resp:  [15-18] 18 (09/03 0751) BP: (100-122)/(64-77) 120/68 (09/03 0751) SpO2:  [95 %-99 %] 99 % (09/03 0751)  Intake/Output from previous day: 09/02 0701 - 09/03 0700 In: 2324.5 [P.O.:360; I.V.:1799.4; IV Piggyback:165.1] Out: 2600 [Urine:2300; Stool:300] Intake/Output this shift: No intake/output data recorded.  Physical Exam: Work of breathing is not labored.  Ostomy bag full of gas.  Wound dressing in place  Lab Results:  Results for orders placed or performed during the hospital encounter of 09/10/20 (from the past 48 hour(s))  Glucose, capillary     Status: None   Collection Time: 09/14/20  9:01 AM  Result Value Ref Range   Glucose-Capillary 74 70 - 99 mg/dL    Comment: Glucose reference range applies only to samples taken after fasting for at least 8 hours.  Glucose, capillary     Status: Abnormal   Collection Time: 09/14/20 12:17 PM  Result Value Ref Range   Glucose-Capillary 101 (H) 70 - 99 mg/dL    Comment: Glucose reference range applies only to samples taken after fasting for at least 8 hours.  C Difficile Quick Screen (NO PCR Reflex)     Status: None   Collection Time: 09/14/20  5:44 PM   Specimen: STOOL  Result Value Ref Range   C Diff antigen NEGATIVE NEGATIVE   C Diff toxin NEGATIVE NEGATIVE   C Diff interpretation No C. difficile detected.     Comment: Performed at [facility], [address], [place], [place] [address]  Glucose, capillary     Status: Abnormal   Collection Time: 09/14/20  6:50 PM  Result Value Ref Range    Glucose-Capillary 109 (H) 70 - 99 mg/dL    Comment: Glucose reference range applies only to samples taken after fasting for at least 8 hours.  Glucose, capillary     Status: Abnormal   Collection Time: 09/14/20  8:09 PM  Result Value Ref Range   Glucose-Capillary 117 (H) 70 - 99 mg/dL    Comment: Glucose reference range applies only to samples taken after fasting for at least 8 hours.  Glucose, capillary     Status: Abnormal   Collection Time: 09/15/20 12:13 AM  Result Value Ref Range   Glucose-Capillary 108 (H) 70 - 99 mg/dL    Comment: Glucose reference range applies only to samples taken after fasting for at least 8 hours.  Comprehensive metabolic panel     Status: Abnormal   Collection Time: 09/15/20  1:18 AM  Result Value Ref Range   Sodium 135 135 - 145 mmol/L   Potassium 3.5 3.5 - 5.1 mmol/L   Chloride 106 98 - 111 mmol/L   CO2 21 (L) 22 - 32 mmol/L   Glucose, Bld 99 70 - 99 mg/dL    Comment: Glucose reference range applies only to samples taken after fasting for at least 8 hours.   BUN 5 (L) 8 - 23 mg/dL   Creatinine, Ser 0.97 0.44 - 1.00 mg/dL   Calcium 8.1 (L) 8.9 - 10.3 mg/dL   Total Protein 5.1 (  L) 6.5 - 8.1 g/dL   Albumin 1.7 (L) 3.5 - 5.0 g/dL   AST 24 15 - 41 U/L   ALT 23 0 - 44 U/L   Alkaline Phosphatase 98 38 - 126 U/L   Total Bilirubin 0.3 0.3 - 1.2 mg/dL   GFR, Estimated >18 >84 mL/min    Comment: (NOTE) Calculated using the CKD-EPI Creatinine Equation (2021)    Anion gap 8 5 - 15    Comment: Performed at [facility], [address] [place], [place] [address]  CBC with Differential/Platelet     Status: Abnormal   Collection Time: 09/15/20  1:18 AM  Result Value Ref Range   WBC 10.9 (H) 4.0 - 10.5 K/uL   RBC 2.49 (L) 3.87 - 5.11 MIL/uL   Hemoglobin 7.7 (L) 12.0 - 15.0 g/dL   HCT 30.1 (L) 60.1 - 09.3 %   MCV 97.2 80.0 - 100.0 fL   MCH 30.9 26.0 - 34.0 pg   MCHC 31.8 30.0 - 36.0 g/dL   RDW 23.5 57.3 - 22.0 %   Platelets 359 150 - 400 K/uL   nRBC  0.0 0.0 - 0.2 %   Neutrophils Relative % 70 %   Neutro Abs 7.8 (H) 1.7 - 7.7 K/uL   Lymphocytes Relative 17 %   Lymphs Abs 1.8 0.7 - 4.0 K/uL   Monocytes Relative 8 %   Monocytes Absolute 0.8 0.1 - 1.0 K/uL   Eosinophils Relative 3 %   Eosinophils Absolute 0.3 0.0 - 0.5 K/uL   Basophils Relative 1 %   Basophils Absolute 0.1 0.0 - 0.1 K/uL   Immature Granulocytes 1 %   Abs Immature Granulocytes 0.08 (H) 0.00 - 0.07 K/uL    Comment: Performed at [facility], [address] [place], [place] [address]  Magnesium     Status: None   Collection Time: 09/15/20  1:18 AM  Result Value Ref Range   Magnesium 1.7 1.7 - 2.4 mg/dL    Comment: Performed at [facility], [address], [place], [place] [address]  Glucose, capillary     Status: None   Collection Time: 09/15/20  4:03 AM  Result Value Ref Range   Glucose-Capillary 87 70 - 99 mg/dL    Comment: Glucose reference range applies only to samples taken after fasting for at least 8 hours.  Glucose, capillary     Status: Abnormal   Collection Time: 09/15/20  9:01 AM  Result Value Ref Range   Glucose-Capillary 101 (H) 70 - 99 mg/dL    Comment: Glucose reference range applies only to samples taken after fasting for at least 8 hours.  Glucose, capillary     Status: Abnormal   Collection Time: 09/15/20  1:04 PM  Result Value Ref Range   Glucose-Capillary 120 (H) 70 - 99 mg/dL    Comment: Glucose reference range applies only to samples taken after fasting for at least 8 hours.  Glucose, capillary     Status: Abnormal   Collection Time: 09/15/20  3:59 PM  Result Value Ref Range   Glucose-Capillary 113 (H) 70 - 99 mg/dL    Comment: Glucose reference range applies only to samples taken after fasting for at least 8 hours.  Glucose, capillary     Status: Abnormal   Collection Time: 09/15/20  8:05 PM  Result Value Ref Range   Glucose-Capillary 109 (H) 70 - 99 mg/dL    Comment: Glucose reference range applies only to samples taken  after fasting for at least 8 hours.  CBC with Differential/Platelet     Status: Abnormal   Collection Time: 09/16/20 12:03 AM  Result Value Ref Range   WBC 11.1 (H) 4.0 - 10.5 K/uL   RBC 2.28 (L) 3.87 - 5.11 MIL/uL   Hemoglobin 7.1 (L) 12.0 - 15.0 g/dL   HCT 47.822.0 (L) 29.536.0 - 62.146.0 %   MCV 96.5 80.0 - 100.0 fL   MCH 31.1 26.0 - 34.0 pg   MCHC 32.3 30.0 - 36.0 g/dL   RDW 30.813.9 65.711.5 - 84.615.5 %   Platelets 367 150 - 400 K/uL   nRBC 0.0 0.0 - 0.2 %   Neutrophils Relative % 70 %   Neutro Abs 7.8 (H) 1.7 - 7.7 K/uL   Lymphocytes Relative 18 %   Lymphs Abs 2.0 0.7 - 4.0 K/uL   Monocytes Relative 8 %   Monocytes Absolute 0.9 0.1 - 1.0 K/uL   Eosinophils Relative 3 %   Eosinophils Absolute 0.4 0.0 - 0.5 K/uL   Basophils Relative 0 %   Basophils Absolute 0.0 0.0 - 0.1 K/uL   Immature Granulocytes 1 %   Abs Immature Granulocytes 0.11 (H) 0.00 - 0.07 K/uL    Comment: Performed at [facility], [address] [place], [place] [address]  Comprehensive metabolic panel     Status: Abnormal   Collection Time: 09/16/20 12:03 AM  Result Value Ref Range   Sodium 136 135 - 145 mmol/L   Potassium 3.9 3.5 - 5.1 mmol/L   Chloride 107 98 - 111 mmol/L   CO2 24 22 - 32 mmol/L   Glucose, Bld 93 70 - 99 mg/dL    Comment: Glucose reference range applies only to samples taken after fasting for at least 8 hours.   BUN <5 (L) 8 - 23 mg/dL   Creatinine, Ser 2.840.67 0.44 - 1.00 mg/dL   Calcium 7.8 (L) 8.9 - 10.3 mg/dL   Total Protein 4.8 (L) 6.5 - 8.1 g/dL   Albumin 1.6 (L) 3.5 - 5.0 g/dL   AST 30 15 - 41 U/L   ALT 25 0 - 44 U/L   Alkaline Phosphatase 147 (H) 38 - 126 U/L   Total Bilirubin 0.3 0.3 - 1.2 mg/dL   GFR, Estimated >13>60 >24>60 mL/min    Comment: (NOTE) Calculated using the CKD-EPI Creatinine Equation (2021)    Anion gap 5 5 - 15    Comment: Performed at [facility], [address] [place], [place] [address]  Glucose, capillary     Status: None   Collection Time: 09/16/20  7:48 AM   Result Value Ref Range   Glucose-Capillary 93 70 - 99 mg/dL    Comment: Glucose reference range applies only to samples taken after fasting for at least 8 hours.    Radiology/Results: No results found.  Anti-infectives: Anti-infectives (From admission, onward)    Start     Dose/Rate Route Frequency Ordered Stop   09/10/20 1130  piperacillin-tazobactam (ZOSYN) IVPB 3.375 g        3.375 g 12.5 mL/hr over 240 Minutes Intravenous Every 8 hours 09/10/20 1127         Assessment/Plan: Problem List: Patient Active Problem List   Diagnosis Date Noted   Sepsis Due to Intraabdomonal Abscess 09/10/2020   COVID-19 virus infection ---- +ve on 09/01/2020 09/10/2020   Post-op intraabdominal/Liver abscess-Had Hartmann's with Colostomy on 08/29/2020 for Sigmoid Diverticulits 09/10/2020   Acute diverticulitis 08/23/2020   Acute renal failure  superimposed on stage 3a chronic kidney disease (HCC) 08/23/2020   Hyponatremia 08/23/2020   Hyperbilirubinemia 08/23/2020   Hypercalcemia 08/23/2020   Essential hypertension, benign 10/01/2018   HLD (hyperlipidemia) 10/01/2018   Vitamin D deficiency disease 10/01/2018   Osteoarthritis 10/01/2018    It has been ~ 6 days since her last CT scan which I reviewed.  There are two area I the liver that may be more amenable to drainage but maybe not.  However, there appears to be a fluid collection beneath the liver that may explain why she gets discomfort and cramps after eating.  I have ordered repeat CT abdomen with oral and IV contrast.  She is afebrile and her WBC is 11K---Hg ~ 7 Want to determine if there is any collection that is amenable to drainage.   * No surgery found *    LOS: 6 days   Matt B. Daphine Deutscher, MD, [facility] [phone] to reach the surgeon on call.    09/16/2020 8:40 AM

## 2020-09-16 NOTE — Progress Notes (Signed)
Dressing changed as ordered. Colostomy bag changed. Pt tolerated well. Will continue to monitor.

## 2020-09-16 NOTE — Progress Notes (Signed)
PROGRESS NOTE    Anne Aguirre  BPZ:[id number] DOB: 12/03/1950 DOA: 09/10/2020 PCP: [name], Buren Kos, MD    Brief Narrative:  Anne Aguirre was admitted to the hospital with the working diagnosis of sepis due to postoperative (recent hartman's procedure) intra-abdominal abscess.   70 year old female past medical history for hypertension and persistent hyponatremia who presented with fevers and chills.  She is status post Hartman's procedure with colostomy on 08/29/2020 for diverticulitis complicated with abscess.  Patient was discharged 09/08/2020, at home she had persistent fevers and chills that prompted her to come back the following day.  Her blood pressure was 139/76, heart rate 100, temperature 38.2 C, respirate 22, oxygen saturation 95%, her lungs are clear to auscultation bilaterally, heart S1-S2, present, rhythmic, tachycardic, abdomen was nondistended, ostomy bag in place, anterior abdominal wound dehiscence, no lower extremity edema.  Sodium 130, potassium 3.8, chloride 100, bicarb 23, glucose 111, BUN 15, creatinine 0.77, lactic acid 1.1, white count 30.4, hemoglobin 9.9, hematocrit 39.7, platelets 424. SARS COVID-19 positive (initial positive on 09/01/2020)  Urine analysis with specific gravity 1.025  CT of the abdomen with right upper quadrant fluid collection, underlying right liver lobe and extending into the right paracolic gutter, suggesting developing abscess.  5 cm greatest dimension.  Patient was placed on IV antibiotic therapy, IR was consulted, unfortunately not able to perform percutaneous drainage. Surgery recommended conservative management.  Clinically slowly improving but continue to have abdominal pain, plan to repeat CT abdomen and pelvis today.   Assessment & Plan:   Principal Problem:   Sepsis Due to Intraabdomonal Abscess Active Problems:   Essential hypertension, benign   Hyponatremia   COVID-19 virus infection ---- +ve on 09/01/2020   Post-op  intraabdominal/Liver abscess-Had Hartmann's with Colostomy on 08/29/2020 for Sigmoid Diverticulits   Right upper quadrant intra-abdominal abscess, complicated with sepsis. Clinically improved, but continue to have abdominal pain, worse with eating.  Wbc is 11.1 and she has remained afebrile.   Plan to continue antibiotic therapy with IV Zosyn, Continue pain control with as needed morphine/ oxycodone, antiacid therapy with pantoprazole.  Follow up with repeat CT abdomen and pelvis and surgery recommendations.   2. HTN. Continue blood pressure control with amlodipine and nebivolol.    3. Recent COVID 19 positive. Patient more than 10 days after initial infection currently no viral symptoms. Considered not infectious.   4. Worsening chronic anemia. Hgb down to 7,1 with Hct at 22,0, no signs of active bleeding. Continue to follow up on Hgb and hct,. Plan to transfuse PRBC if Hgb less than 7.   5. Bacteriuria (no urine infection) multidrug resistant Klebsiella Pneumonia.  10,000 CFU. No urinary symptoms, no treatment indicated.   6. Hyponatremia and hypokalemia. Renal function stable with serum cr at 0,67 with K at 3,9 and serum bicarbonate at 24. Follow up renal function in am, avoid hypotension and nephrotoxic medications.  Patient is tolerating po diet well, will dc IV fluids.   Patient continue to be at high risk for worsening sepsis   Status is: Inpatient  Remains inpatient appropriate because:Inpatient level of care appropriate due to severity of illness  Dispo: The patient is from: Home              Anticipated d/c is to: Home              Patient currently is not medically stable to d/c.   Difficult to place patient No   DVT prophylaxis: Enoxaparin   Code Status:  full  Family Communication:  I spoke with patient's husband at the bedside, we talked in detail about patient's condition, plan of care and prognosis and all questions were addressed.      Nutrition  Status: Nutrition Problem: Inadequate oral intake Etiology: altered GI function, decreased appetite Signs/Symptoms: per patient/family report Interventions: Ensure Enlive (each supplement provides 350kcal and 20 grams of protein), MVI     Consultants:  Surgery     Antimicrobials:  IV Zosyn     Subjective: Patient continue to have abdominal pain worse with eating, no nausea or vomiting, no dyspnea or chest pain.   Objective: Vitals:   09/15/20 1603 09/15/20 2002 09/16/20 0444 09/16/20 0751  BP: 111/64 100/64 117/66 120/68  Pulse: 95 85 87 93  Resp: 18 15 17 18   Temp: 99.3 F (37.4 C) 98.8 F (37.1 C) 98.4 F (36.9 C) 98.3 F (36.8 C)  TempSrc: Oral Oral Oral Oral  SpO2: 98% 95% 96% 99%  Weight:      Height:        Intake/Output Summary (Last 24 hours) at 09/16/2020 1343 Last data filed at 09/16/2020 1203 Gross per 24 hour  Intake 2204.46 ml  Output 2900 ml  Net -695.54 ml   Filed Weights   09/10/20 0901 09/10/20 0927  Weight: 72 kg 69.5 kg    Examination:   General: Not in pain or dyspnea, deconditioned  Neurology: Awake and alert, non focal  E ENT: mild pallor, no icterus, oral mucosa moist Cardiovascular: No JVD. S1-S2 present, rhythmic, no gallops, rubs, or murmurs. No lower extremity edema. Pulmonary: vesicular breath sounds bilaterally, adequate air movement, no wheezing, rhonchi or rales. Gastrointestinal. Abdomen mild distended, positive ostomy bag in place, dressing over surgical wounds, clean borders, open wound with dressing in place.  Skin. No rashes Musculoskeletal: no joint deformities     Data Reviewed: I have personally reviewed following labs and imaging studies  CBC: Recent Labs  Lab 09/10/20 0910 09/11/20 0024 09/12/20 0340 09/13/20 0003 09/14/20 0242 09/15/20 0118 09/16/20 0003  WBC 30.4*   < > 19.6* 15.5* 13.8* 10.9* 11.1*  NEUTROABS 26.6*  --   --   --  10.8* 7.8* 7.8*  HGB 9.9*   < > 7.7* 8.3* 7.7* 7.7* 7.1*  HCT 29.7*   <  > 23.2* 24.6* 23.2* 24.2* 22.0*  MCV 97.1   < > 95.1 93.9 94.7 97.2 96.5  PLT 424*   < > 242 282 315 359 367   < > = values in this interval not displayed.   Basic Metabolic Panel: Recent Labs  Lab 09/12/20 0340 09/13/20 0003 09/14/20 0242 09/15/20 0118 09/16/20 0003  NA 130* 131* 131* 135 136  K 2.6* 3.4* 3.4* 3.5 3.9  CL 99 100 102 106 107  CO2 23 24 22  21* 24  GLUCOSE 165* 101* 117* 99 93  BUN 7* 5* <5* 5* <5*  CREATININE 0.78 0.74 0.74 0.82 0.67  CALCIUM 7.9* 8.0* 7.8* 8.1* 7.8*  MG  --  1.4* 1.3* 1.7  --    GFR: Estimated Creatinine Clearance: 63 mL/min (by C-G formula based on SCr of 0.67 mg/dL). Liver Function Tests: Recent Labs  Lab 09/10/20 0910 09/11/20 0024 09/14/20 0242 09/15/20 0118 09/16/20 0003  AST 32 29 20 24 30   ALT 37 33 24 23 25   ALKPHOS 65 58 69 98 147*  BILITOT 1.2 1.5* 0.5 0.3 0.3  PROT 5.4* 4.6* 4.9* 5.1* 4.8*  ALBUMIN 2.3* 1.8* 1.6* 1.7* 1.6*  No results for input(s): LIPASE, AMYLASE in the last 168 hours. No results for input(s): AMMONIA in the last 168 hours. Coagulation Profile: Recent Labs  Lab 09/10/20 0910  INR 1.1   Cardiac Enzymes: No results for input(s): CKTOTAL, CKMB, CKMBINDEX, TROPONINI in the last 168 hours. BNP (last 3 results) No results for input(s): PROBNP in the last 8760 hours. HbA1C: No results for input(s): HGBA1C in the last 72 hours. CBG: Recent Labs  Lab 09/15/20 1304 09/15/20 1559 09/15/20 2005 09/16/20 0748 09/16/20 1153  GLUCAP 120* 113* 109* 93 79   Lipid Profile: No results for input(s): CHOL, HDL, LDLCALC, TRIG, CHOLHDL, LDLDIRECT in the last 72 hours. Thyroid Function Tests: No results for input(s): TSH, T4TOTAL, FREET4, T3FREE, THYROIDAB in the last 72 hours. Anemia Panel: No results for input(s): VITAMINB12, FOLATE, FERRITIN, TIBC, IRON, RETICCTPCT in the last 72 hours.    Radiology Studies: I have reviewed all of the imaging during this hospital visit personally     Scheduled  Meds:  amLODipine  10 mg Oral Daily   feeding supplement  237 mL Oral BID BM   heparin  5,000 Units Subcutaneous Q8H   multivitamin with minerals  1 tablet Oral Daily   nebivolol  20 mg Oral Q1200   pantoprazole  40 mg Oral Daily   sodium chloride flush  3 mL Intravenous Q12H   sodium chloride flush  3 mL Intravenous Q12H   Continuous Infusions:  sodium chloride     lactated ringers with kcl 75 mL/hr at 09/16/20 0653   piperacillin-tazobactam (ZOSYN)  IV 12.5 mL/hr at 09/16/20 6761     LOS: 6 days        Geneve Kimpel Annett Gula, MD

## 2020-09-16 NOTE — Plan of Care (Signed)
  Problem: Education: Goal: Knowledge of General Education information will improve Description: Including pain rating scale, medication(s)/side effects and non-pharmacologic comfort measures Outcome: Progressing   Problem: Health Behavior/Discharge Planning: Goal: Ability to manage health-related needs will improve Outcome: Progressing   Problem: Clinical Measurements: Goal: Ability to maintain clinical measurements within normal limits will improve Outcome: Progressing Goal: Cardiovascular complication will be avoided Outcome: Progressing   Problem: Activity: Goal: Risk for activity intolerance will decrease Outcome: Progressing   Problem: Nutrition: Goal: Adequate nutrition will be maintained Outcome: Progressing   Problem: Elimination: Goal: Will not experience complications related to bowel motility Outcome: Progressing Goal: Will not experience complications related to urinary retention Outcome: Progressing   Problem: Pain Managment: Goal: General experience of comfort will improve Outcome: Progressing   Problem: Safety: Goal: Ability to remain free from injury will improve Outcome: Progressing   Problem: Skin Integrity: Goal: Risk for impaired skin integrity will decrease Outcome: Progressing   

## 2020-09-17 DIAGNOSIS — A419 Sepsis, unspecified organism: Secondary | ICD-10-CM | POA: Diagnosis not present

## 2020-09-17 LAB — CBC WITH DIFFERENTIAL/PLATELET
Abs Immature Granulocytes: 0.09 10*3/uL — ABNORMAL HIGH (ref 0.00–0.07)
Basophils Absolute: 0 10*3/uL (ref 0.0–0.1)
Basophils Relative: 0 %
Eosinophils Absolute: 0.4 10*3/uL (ref 0.0–0.5)
Eosinophils Relative: 4 %
HCT: 23 % — ABNORMAL LOW (ref 36.0–46.0)
Hemoglobin: 7.4 g/dL — ABNORMAL LOW (ref 12.0–15.0)
Immature Granulocytes: 1 %
Lymphocytes Relative: 13 %
Lymphs Abs: 1.5 10*3/uL (ref 0.7–4.0)
MCH: 31.1 pg (ref 26.0–34.0)
MCHC: 32.2 g/dL (ref 30.0–36.0)
MCV: 96.6 fL (ref 80.0–100.0)
Monocytes Absolute: 0.6 10*3/uL (ref 0.1–1.0)
Monocytes Relative: 5 %
Neutro Abs: 9.1 10*3/uL — ABNORMAL HIGH (ref 1.7–7.7)
Neutrophils Relative %: 77 %
Platelets: 518 10*3/uL — ABNORMAL HIGH (ref 150–400)
RBC: 2.38 MIL/uL — ABNORMAL LOW (ref 3.87–5.11)
RDW: 13.9 % (ref 11.5–15.5)
WBC: 11.6 10*3/uL — ABNORMAL HIGH (ref 4.0–10.5)
nRBC: 0 % (ref 0.0–0.2)

## 2020-09-17 LAB — GLUCOSE, CAPILLARY
Glucose-Capillary: 85 mg/dL (ref 70–99)
Glucose-Capillary: 111 mg/dL — ABNORMAL HIGH (ref 70–99)
Glucose-Capillary: 82 mg/dL (ref 70–99)
Glucose-Capillary: 91 mg/dL (ref 70–99)

## 2020-09-17 LAB — BASIC METABOLIC PANEL
Anion gap: 7 (ref 5–15)
BUN: <5 mg/dL — ABNORMAL LOW (ref 8–23)
CO2: 23 mmol/L (ref 22–32)
Calcium: 8.1 mg/dL — ABNORMAL LOW (ref 8.9–10.3)
Chloride: 104 mmol/L (ref 98–111)
Creatinine, Ser: 0.7 mg/dL (ref 0.44–1.00)
GFR, Estimated: >60 mL/min (ref >60)
Glucose, Bld: 87 mg/dL (ref 70–99)
Potassium: 3.6 mmol/L (ref 3.5–5.1)
Sodium: 134 mmol/L — ABNORMAL LOW (ref 135–145)

## 2020-09-17 LAB — PREPARE RBC (CROSSMATCH)

## 2020-09-17 MED ORDER — AMOXICILLIN-POT CLAVULANATE 875-125 MG PO TABS
1.0000 | ORAL_TABLET | Freq: Two times a day (BID) | ORAL | Status: DC
  Administered 2020-09-17 – 2020-09-18 (×2): 1 via ORAL
  Filled 2020-09-17 (×2): qty 1

## 2020-09-17 MED ORDER — SODIUM CHLORIDE 0.9% IV SOLUTION: Freq: Once | INTRAVENOUS | Status: AC

## 2020-09-17 NOTE — Plan of Care (Signed)
  Problem: Education: Goal: Knowledge of General Education information will improve Description: Including pain rating scale, medication(s)/side effects and non-pharmacologic comfort measures Outcome: Progressing   Problem: Health Behavior/Discharge Planning: Goal: Ability to manage health-related needs will improve Outcome: Progressing   Problem: Clinical Measurements: Goal: Will remain free from infection Outcome: Progressing Goal: Respiratory complications will improve Outcome: Progressing   Problem: Activity: Goal: Risk for activity intolerance will decrease Outcome: Progressing   Problem: Nutrition: Goal: Adequate nutrition will be maintained Outcome: Progressing   Problem: Elimination: Goal: Will not experience complications related to bowel motility Outcome: Progressing Goal: Will not experience complications related to urinary retention Outcome: Progressing   Problem: Pain Managment: Goal: General experience of comfort will improve Outcome: Progressing   Problem: Safety: Goal: Ability to remain free from injury will improve Outcome: Progressing   Problem: Skin Integrity: Goal: Risk for impaired skin integrity will decrease Outcome: Progressing

## 2020-09-17 NOTE — Progress Notes (Addendum)
PROGRESS NOTE   Anne Aguirre  HDQ:[id number] DOB: 07-16-1950 DOA: 09/10/2020 PCP: [name], [name], MD  Brief Narrative:  70 year old white female community dwelling HTN, recent hospitalization 8/10 through 8/26 sigmoid diverticulitis with local abscess-underwent Hartmann procedure 8/16--diagnosed at that time of COVID-19 infection--Rx with remdesivir steroids completed 7 days of IV steroids  Return to [facility] with fever 101 finished antibiotics 8/26--CT scan showed intra abdominal and liver abscess WBC of 30 lactic acid not elevated-circumscribed fluid collection right upper quadrant IR consulted but collection RUQ not amenable for percutaneous drain  Has so far been managed conservatively by general surgery--she has been tested for C. difficile and this is negative she continues to have right lower quadrant pain and we are contemplating whether to get a CT scan but have held on that because her white count has improved significantly although she does have pain  Hospital-Problem based course  Sepsis on admission secondary sigmoid diverticulitis + abscess status post Hartman's 8/16 Fever 102.7 on 8/28-leukocytosis improving steadily CT reviewed and subhepatic collections are okay-incentive spirometer as per them CT 9/3 shows no abscess, transition Zosyn to Augmentin until 9/6 [10 d] Nutritional anemia, normocytic full --possible dilution component as was on fluids which are now saline locked General surgery transfusing 1 unit of blood--check a.m. labs Diarrhea CDIFF tested on 9/1 and is neg-improving 7 Recent COVID-19 infection Recent Rx steroids-no hypoxia and asymptomatic DC precautions Hyponatremia hypokalemia Hypokalemia improved-Replaced and re-check periodically HTN Continue amlodipine 10, Bystolic 20  DVT prophylaxis: Heparin Code Status: Full Family Communication: Discussed with husband [name] at bedside daily,  phone number = [phone] Disposition:  Status is:  Inpatient  Remains inpatient appropriate because:Hemodynamically unstable and Unsafe d/c plan  Dispo: The patient is from: Home              Anticipated d/c is to:  unClear at this time              Patient currently is not medically stable to d/c.   Difficult to place patient No       Consultants:  Gen surg IR  Procedures: n  Antimicrobials: Zosyn->Augmentin 9/4   Subjective:  Overall doing well Mild lower quadrant pain but eating and drinking had solids without issues No chest pain no fever no chills ROM intact ambulatory pretty independently  Objective: Vitals:   09/17/20 0818 09/17/20 1207 09/17/20 1222 09/17/20 1419  BP: 113/72 95/61 101/61 113/62  Pulse: 91 91 88 86  Resp: 18     Temp: 98.8 F (37.1 C) 98.8 F (37.1 C) 98.2 F (36.8 C) 98 F (36.7 C)  TempSrc: Oral Oral Oral Oral  SpO2: 97% 99% 99% 100%  Weight:      Height:        Intake/Output Summary (Last 24 hours) at 09/17/2020 1619 Last data filed at 09/17/2020 1419 Gross per 24 hour  Intake 1671.8 ml  Output 2600 ml  Net -928.2 ml    Filed Weights   09/10/20 0901 09/10/20 0927  Weight: 72 kg 69.5 kg    Examination:  Coherent pleasant white female no distress S1-S2 no MRG RRR Abdomen slightly distended, postop wounds noted --ostomy not examined today decreasing right lower quadrant tenderness No lower extremity edema no rash ROM intact, power 5/5  Data Reviewed: personally reviewed   CBC    Component Value Date/Time   WBC 11.6 (H) 09/17/2020 0201   RBC 2.38 (L) 09/17/2020 0201   HGB 7.4 (L) 09/17/2020 0201   HCT 23.0 (  L) 09/17/2020 0201   PLT 518 (H) 09/17/2020 0201   MCV 96.6 09/17/2020 0201   MCH 31.1 09/17/2020 0201   MCHC 32.2 09/17/2020 0201   RDW 13.9 09/17/2020 0201   LYMPHSABS 1.5 09/17/2020 0201   MONOABS 0.6 09/17/2020 0201   EOSABS 0.4 09/17/2020 0201   BASOSABS 0.0 09/17/2020 0201   CMP Latest Ref Rng & Units 09/17/2020 09/16/2020 09/15/2020  Glucose 70 - 99 mg/dL  87 93 99  BUN 8 - 23 mg/dL <0(N) <3(Z) 5(L)  Creatinine 0.44 - 1.00 mg/dL 7.67 3.41 9.37  Sodium 135 - 145 mmol/L 134(L) 136 135  Potassium 3.5 - 5.1 mmol/L 3.6 3.9 3.5  Chloride 98 - 111 mmol/L 104 107 106  CO2 22 - 32 mmol/L 23 24 21(L)  Calcium 8.9 - 10.3 mg/dL 8.1(L) 7.8(L) 8.1(L)  Total Protein 6.5 - 8.1 g/dL - 4.8(L) 5.1(L)  Total Bilirubin 0.3 - 1.2 mg/dL - 0.3 0.3  Alkaline Phos 38 - 126 U/L - 147(H) 98  AST 15 - 41 U/L - 30 24  ALT 0 - 44 U/L - 25 23     Radiology Studies: CT ABDOMEN PELVIS W CONTRAST  Result Date: 09/16/2020 CLINICAL DATA:  Abdominal abscess/infection suspected. Follow up abscess evaluation. EXAM: CT ABDOMEN AND PELVIS WITH CONTRAST TECHNIQUE: Multidetector CT imaging of the abdomen and pelvis was performed using the standard protocol following bolus administration of intravenous contrast. CONTRAST:  5mL OMNIPAQUE IOHEXOL 350 MG/ML SOLN COMPARISON:  Abdominopelvic CT 09/10/2020 and 08/25/2020. FINDINGS: Lower chest: Right-greater-than-left pleural effusions are again noted with associated bibasilar atelectasis. The left pleural effusion and left basilar atelectasis have mildly worsened. No significant pericardial fluid. Prominent coronary artery atherosclerosis is noted. There are bilateral breast implants. Hepatobiliary: Scattered hepatic cysts are stable. Focal subcapsular fluid collection along the lateral dome of the right hepatic lobe measuring 4.2 x 2.2 cm on image 11/3 is unchanged. No evidence of gallstones, gallbladder wall thickening or biliary dilatation. Pancreas: Unremarkable. No pancreatic ductal dilatation or surrounding inflammatory changes. Spleen: Normal in size without focal abnormality. Adrenals/Urinary Tract: Both adrenal glands appear normal. Both kidneys appear normal without urinary tract calculus or hydronephrosis. The bladder is moderately distended without focal abnormality. Stomach/Bowel: Enteric contrast was administered and has extended into  the distal colostomy. The stomach appears unremarkable for its degree of distention. There is mild small bowel dilatation in the right mid abdomen, but no evidence of bowel obstruction or extravasation. Stable surgical changes from partial left colectomy with a stable appearing Hartman pouch. Vascular/Lymphatic: There are no enlarged abdominal or pelvic lymph nodes. Aortic and branch vessel atherosclerosis. The portal, superior mesenteric and splenic veins are patent. Reproductive: The uterus and ovaries appear normal. No adnexal mass. Other: Open anterior abdominal incision. Diffuse inflammatory changes throughout the intra-abdominal fat without enlarging focal fluid collection. As above, focal subcapsular fluid along the dome of the right hepatic lobe, unchanged. The collection inferior to the liver appears slightly smaller, measuring 4.5 x 2.9 cm on image 33/3. Fluid anteriorly in the left lower quadrant, inferior to the colostomy is unchanged and appears nonfocal. Small pelvic collections appear improved. Musculoskeletal: No acute or significant osseous findings. Degenerative changes in the spine associated with a convex right lumbar scoliosis. IMPRESSION: 1. Compared with the most recent study of 6 days ago, the peritoneal collections are stable to slightly improved. No new or enlarging collections are identified. 2. No evidence of extravasated enteric contrast following left colectomy and descending colostomy. No evidence of bowel obstruction. 3.  Bilateral pleural effusions and bibasilar atelectasis. The left pleural effusion appears slightly larger. 4.  Aortic Atherosclerosis (ICD10-I70.0). Electronically Signed   By: Carey Bullocks M.D.   On: 09/16/2020 16:41     Scheduled Meds:  amLODipine  10 mg Oral Daily   enoxaparin (LOVENOX) injection  40 mg Subcutaneous Q24H   feeding supplement  237 mL Oral BID BM   multivitamin with minerals  1 tablet Oral Daily   nebivolol  20 mg Oral Q1200   pantoprazole   40 mg Oral Daily   sodium chloride flush  3 mL Intravenous Q12H   sodium chloride flush  3 mL Intravenous Q12H   Continuous Infusions:  sodium chloride     piperacillin-tazobactam (ZOSYN)  IV 3.375 g (09/17/20 1423)     LOS: 7 days   Time spent: 63  Rhetta Mura, MD [facility] To contact the attending provider between 7A-7P or the covering provider during after hours 7P-7A, please log into the web site [email or web address] and access using universal [place] password for that web site. If you do not have the password, please call the hospital operator.  09/17/2020, 4:19 PM

## 2020-09-17 NOTE — Progress Notes (Addendum)
ID Brief Note    Notified by lab on 9/2 with urine cx growing  CRE 10,000 colonies/ml Klebsiella pneumoniae- Likely a colonizer.  Ordered contact precautions on 09/15/20  Odette Fraction, MD Infectious Disease Physician [facility] [address] [place], [place] [address] Phone: [phone]  Fax: [phone]

## 2020-09-17 NOTE — Progress Notes (Addendum)
Patient ID: Anne Aguirre, female   DOB: 03/15/1950, 70 y.o.   MRN: [id number] [facility] Progress Note:   * No surgery found *  Subjective: Mental status is clear.  Complaints less complaints of right sided pain. Objective: Vital signs in last 24 hours: Temp:  [98.6 F (37 C)-98.8 F (37.1 C)] 98.8 F (37.1 C) (09/04 0818) Pulse Rate:  [82-91] 91 (09/04 0818) Resp:  [16-20] 18 (09/04 0818) BP: (113-120)/(72-73) 113/72 (09/04 0818) SpO2:  [97 %-98 %] 97 % (09/04 0818)  Intake/Output from previous day: 09/03 0701 - 09/04 0700 In: 1346.8 [P.O.:480; I.V.:756.7; IV Piggyback:110.2] Out: 3500 [Urine:2950; Stool:550] Intake/Output this shift: No intake/output data recorded.  Physical Exam: Work of breathing is not labored.  Incision packed.  Ostomy functioning.    Lab Results:  Results for orders placed or performed during the hospital encounter of 09/10/20 (from the past 48 hour(s))  Glucose, capillary     Status: Abnormal   Collection Time: 09/15/20  9:01 AM  Result Value Ref Range   Glucose-Capillary 101 (H) 70 - 99 mg/dL    Comment: Glucose reference range applies only to samples taken after fasting for at least 8 hours.  Glucose, capillary     Status: Abnormal   Collection Time: 09/15/20  1:04 PM  Result Value Ref Range   Glucose-Capillary 120 (H) 70 - 99 mg/dL    Comment: Glucose reference range applies only to samples taken after fasting for at least 8 hours.  Glucose, capillary     Status: Abnormal   Collection Time: 09/15/20  3:59 PM  Result Value Ref Range   Glucose-Capillary 113 (H) 70 - 99 mg/dL    Comment: Glucose reference range applies only to samples taken after fasting for at least 8 hours.  Glucose, capillary     Status: Abnormal   Collection Time: 09/15/20  8:05 PM  Result Value Ref Range   Glucose-Capillary 109 (H) 70 - 99 mg/dL    Comment: Glucose reference range applies only to samples taken after fasting for at least 8 hours.  CBC with  Differential/Platelet     Status: Abnormal   Collection Time: 09/16/20 12:03 AM  Result Value Ref Range   WBC 11.1 (H) 4.0 - 10.5 K/uL   RBC 2.28 (L) 3.87 - 5.11 MIL/uL   Hemoglobin 7.1 (L) 12.0 - 15.0 g/dL   HCT 40.922.0 (L) 81.136.0 - 91.446.0 %   MCV 96.5 80.0 - 100.0 fL   MCH 31.1 26.0 - 34.0 pg   MCHC 32.3 30.0 - 36.0 g/dL   RDW 78.213.9 95.611.5 - 21.315.5 %   Platelets 367 150 - 400 K/uL   nRBC 0.0 0.0 - 0.2 %   Neutrophils Relative % 70 %   Neutro Abs 7.8 (H) 1.7 - 7.7 K/uL   Lymphocytes Relative 18 %   Lymphs Abs 2.0 0.7 - 4.0 K/uL   Monocytes Relative 8 %   Monocytes Absolute 0.9 0.1 - 1.0 K/uL   Eosinophils Relative 3 %   Eosinophils Absolute 0.4 0.0 - 0.5 K/uL   Basophils Relative 0 %   Basophils Absolute 0.0 0.0 - 0.1 K/uL   Immature Granulocytes 1 %   Abs Immature Granulocytes 0.11 (H) 0.00 - 0.07 K/uL    Comment: Performed at [facility], [address] [place], [place] [id number]  Comprehensive metabolic panel     Status: Abnormal   Collection Time: 09/16/20 12:03 AM  Result Value Ref Range   Sodium 136 135 -  145 mmol/L   Potassium 3.9 3.5 - 5.1 mmol/L   Chloride 107 98 - 111 mmol/L   CO2 24 22 - 32 mmol/L   Glucose, Bld 93 70 - 99 mg/dL    Comment: Glucose reference range applies only to samples taken after fasting for at least 8 hours.   BUN <5 (L) 8 - 23 mg/dL   Creatinine, Ser 4.40 0.44 - 1.00 mg/dL   Calcium 7.8 (L) 8.9 - 10.3 mg/dL   Total Protein 4.8 (L) 6.5 - 8.1 g/dL   Albumin 1.6 (L) 3.5 - 5.0 g/dL   AST 30 15 - 41 U/L   ALT 25 0 - 44 U/L   Alkaline Phosphatase 147 (H) 38 - 126 U/L   Total Bilirubin 0.3 0.3 - 1.2 mg/dL   GFR, Estimated >34 >74 mL/min    Comment: (NOTE) Calculated using the CKD-EPI Creatinine Equation (2021)    Anion gap 5 5 - 15    Comment: Performed at [facility], [address], [place], [place] [address]  Glucose, capillary     Status: None   Collection Time: 09/16/20  7:48 AM  Result Value Ref Range   Glucose-Capillary 93 70 -  99 mg/dL    Comment: Glucose reference range applies only to samples taken after fasting for at least 8 hours.  Glucose, capillary     Status: None   Collection Time: 09/16/20 11:53 AM  Result Value Ref Range   Glucose-Capillary 79 70 - 99 mg/dL    Comment: Glucose reference range applies only to samples taken after fasting for at least 8 hours.  Glucose, capillary     Status: None   Collection Time: 09/16/20  5:05 PM  Result Value Ref Range   Glucose-Capillary 85 70 - 99 mg/dL    Comment: Glucose reference range applies only to samples taken after fasting for at least 8 hours.  Glucose, capillary     Status: Abnormal   Collection Time: 09/16/20  9:55 PM  Result Value Ref Range   Glucose-Capillary 110 (H) 70 - 99 mg/dL    Comment: Glucose reference range applies only to samples taken after fasting for at least 8 hours.  CBC with Differential/Platelet     Status: Abnormal   Collection Time: 09/17/20  2:01 AM  Result Value Ref Range   WBC 11.6 (H) 4.0 - 10.5 K/uL   RBC 2.38 (L) 3.87 - 5.11 MIL/uL   Hemoglobin 7.4 (L) 12.0 - 15.0 g/dL   HCT 38.7 (L) 56.4 - 33.2 %   MCV 96.6 80.0 - 100.0 fL   MCH 31.1 26.0 - 34.0 pg   MCHC 32.2 30.0 - 36.0 g/dL   RDW 95.1 88.4 - 16.6 %   Platelets 518 (H) 150 - 400 K/uL   nRBC 0.0 0.0 - 0.2 %   Neutrophils Relative % 77 %   Neutro Abs 9.1 (H) 1.7 - 7.7 K/uL   Lymphocytes Relative 13 %   Lymphs Abs 1.5 0.7 - 4.0 K/uL   Monocytes Relative 5 %   Monocytes Absolute 0.6 0.1 - 1.0 K/uL   Eosinophils Relative 4 %   Eosinophils Absolute 0.4 0.0 - 0.5 K/uL   Basophils Relative 0 %   Basophils Absolute 0.0 0.0 - 0.1 K/uL   Immature Granulocytes 1 %   Abs Immature Granulocytes 0.09 (H) 0.00 - 0.07 K/uL    Comment: Performed at [facility], [address], [place], [place] [address]  Basic metabolic panel  Status: Abnormal   Collection Time: 09/17/20  2:01 AM  Result Value Ref Range   Sodium 134 (L) 135 - 145 mmol/L   Potassium 3.6 3.5 - 5.1  mmol/L   Chloride 104 98 - 111 mmol/L   CO2 23 22 - 32 mmol/L   Glucose, Bld 87 70 - 99 mg/dL    Comment: Glucose reference range applies only to samples taken after fasting for at least 8 hours.   BUN <5 (L) 8 - 23 mg/dL   Creatinine, Ser 3.66 0.44 - 1.00 mg/dL   Calcium 8.1 (L) 8.9 - 10.3 mg/dL   GFR, Estimated >44 >03 mL/min    Comment: (NOTE) Calculated using the CKD-EPI Creatinine Equation (2021)    Anion gap 7 5 - 15    Comment: Performed at [facility], [address], [place], [place] [address]  Glucose, capillary     Status: None   Collection Time: 09/17/20  8:17 AM  Result Value Ref Range   Glucose-Capillary 85 70 - 99 mg/dL    Comment: Glucose reference range applies only to samples taken after fasting for at least 8 hours.    Radiology/Results: CT ABDOMEN PELVIS W CONTRAST  Result Date: 09/16/2020 CLINICAL DATA:  Abdominal abscess/infection suspected. Follow up abscess evaluation. EXAM: CT ABDOMEN AND PELVIS WITH CONTRAST TECHNIQUE: Multidetector CT imaging of the abdomen and pelvis was performed using the standard protocol following bolus administration of intravenous contrast. CONTRAST:  60mL OMNIPAQUE IOHEXOL 350 MG/ML SOLN COMPARISON:  Abdominopelvic CT 09/10/2020 and 08/25/2020. FINDINGS: Lower chest: Right-greater-than-left pleural effusions are again noted with associated bibasilar atelectasis. The left pleural effusion and left basilar atelectasis have mildly worsened. No significant pericardial fluid. Prominent coronary artery atherosclerosis is noted. There are bilateral breast implants. Hepatobiliary: Scattered hepatic cysts are stable. Focal subcapsular fluid collection along the lateral dome of the right hepatic lobe measuring 4.2 x 2.2 cm on image 11/3 is unchanged. No evidence of gallstones, gallbladder wall thickening or biliary dilatation. Pancreas: Unremarkable. No pancreatic ductal dilatation or surrounding inflammatory changes. Spleen: Normal in size  without focal abnormality. Adrenals/Urinary Tract: Both adrenal glands appear normal. Both kidneys appear normal without urinary tract calculus or hydronephrosis. The bladder is moderately distended without focal abnormality. Stomach/Bowel: Enteric contrast was administered and has extended into the distal colostomy. The stomach appears unremarkable for its degree of distention. There is mild small bowel dilatation in the right mid abdomen, but no evidence of bowel obstruction or extravasation. Stable surgical changes from partial left colectomy with a stable appearing Hartman pouch. Vascular/Lymphatic: There are no enlarged abdominal or pelvic lymph nodes. Aortic and branch vessel atherosclerosis. The portal, superior mesenteric and splenic veins are patent. Reproductive: The uterus and ovaries appear normal. No adnexal mass. Other: Open anterior abdominal incision. Diffuse inflammatory changes throughout the intra-abdominal fat without enlarging focal fluid collection. As above, focal subcapsular fluid along the dome of the right hepatic lobe, unchanged. The collection inferior to the liver appears slightly smaller, measuring 4.5 x 2.9 cm on image 33/3. Fluid anteriorly in the left lower quadrant, inferior to the colostomy is unchanged and appears nonfocal. Small pelvic collections appear improved. Musculoskeletal: No acute or significant osseous findings. Degenerative changes in the spine associated with a convex right lumbar scoliosis. IMPRESSION: 1. Compared with the most recent study of 6 days ago, the peritoneal collections are stable to slightly improved. No new or enlarging collections are identified. 2. No evidence of extravasated enteric contrast following left colectomy and descending colostomy.  No evidence of bowel obstruction. 3. Bilateral pleural effusions and bibasilar atelectasis. The left pleural effusion appears slightly larger. 4.  Aortic Atherosclerosis (ICD10-I70.0). Electronically Signed   By:  Carey Bullocks M.D.   On: 09/16/2020 16:41    Anti-infectives: Anti-infectives (From admission, onward)    Start     Dose/Rate Route Frequency Ordered Stop   09/10/20 1130  piperacillin-tazobactam (ZOSYN) IVPB 3.375 g        3.375 g 12.5 mL/hr over 240 Minutes Intravenous Every 8 hours 09/10/20 1127         Assessment/Plan: Problem List: Patient Active Problem List   Diagnosis Date Noted   Sepsis Due to Intraabdomonal Abscess 09/10/2020   COVID-19 virus infection ---- +ve on 09/01/2020 09/10/2020   Post-op intraabdominal/Liver abscess-Had Hartmann's with Colostomy on 08/29/2020 for Sigmoid Diverticulits 09/10/2020   Acute diverticulitis 08/23/2020   Acute renal failure superimposed on stage 3a chronic kidney disease (HCC) 08/23/2020   Hyponatremia 08/23/2020   Hyperbilirubinemia 08/23/2020   Hypercalcemia 08/23/2020   Essential hypertension, benign 10/01/2018   HLD (hyperlipidemia) 10/01/2018   Vitamin D deficiency disease 10/01/2018   Osteoarthritis 10/01/2018    CT reviewed and subhepatic collection is smaller.  Dome of right liver ~ the same.  Pleural effusions and atelectasis remain a concern.  After CT last night she reports a productive cough.  She doesn't have an incentive spirometer and I will order one.  Will order a unit of packed cells for Hg ~7.   * No surgery found *    LOS: 7 days   Matt B. Daphine Deutscher, MD, [facility] [phone] to reach the surgeon on call.    09/17/2020 8:26 AM

## 2020-09-17 NOTE — Progress Notes (Signed)
Dressing changed as ordered. Husband at bedside during dressing change and verbalized understanding. Pt tolerated well. Will continue to monitor.

## 2020-09-18 DIAGNOSIS — A419 Sepsis, unspecified organism: Secondary | ICD-10-CM | POA: Diagnosis not present

## 2020-09-18 LAB — BPAM RBC
Blood Product Expiration Date: 202210022359
ISSUE DATE / TIME: 202209041149
Unit Type and Rh: 5100

## 2020-09-18 LAB — CBC WITH DIFFERENTIAL/PLATELET
Abs Immature Granulocytes: 0.06 10*3/uL (ref 0.00–0.07)
Basophils Absolute: 0 10*3/uL (ref 0.0–0.1)
Basophils Relative: 0 %
Eosinophils Absolute: 0.3 10*3/uL (ref 0.0–0.5)
Eosinophils Relative: 3 %
HCT: 27.8 % — ABNORMAL LOW (ref 36.0–46.0)
Hemoglobin: 9 g/dL — ABNORMAL LOW (ref 12.0–15.0)
Immature Granulocytes: 1 %
Lymphocytes Relative: 16 %
Lymphs Abs: 1.6 10*3/uL (ref 0.7–4.0)
MCH: 29.4 pg (ref 26.0–34.0)
MCHC: 32.4 g/dL (ref 30.0–36.0)
MCV: 90.8 fL (ref 80.0–100.0)
Monocytes Absolute: 0.8 10*3/uL (ref 0.1–1.0)
Monocytes Relative: 8 %
Neutro Abs: 7.3 10*3/uL (ref 1.7–7.7)
Neutrophils Relative %: 72 %
Platelets: 557 10*3/uL — ABNORMAL HIGH (ref 150–400)
RBC: 3.06 MIL/uL — ABNORMAL LOW (ref 3.87–5.11)
RDW: 15.7 % — ABNORMAL HIGH (ref 11.5–15.5)
WBC: 10 10*3/uL (ref 4.0–10.5)
nRBC: 0 % (ref 0.0–0.2)

## 2020-09-18 LAB — COMPREHENSIVE METABOLIC PANEL
ALT: 26 U/L (ref 0–44)
AST: 30 U/L (ref 15–41)
Albumin: 1.7 g/dL — ABNORMAL LOW (ref 3.5–5.0)
Alkaline Phosphatase: 146 U/L — ABNORMAL HIGH (ref 38–126)
Anion gap: 15 (ref 5–15)
BUN: 7 mg/dL — ABNORMAL LOW (ref 8–23)
CO2: 16 mmol/L — ABNORMAL LOW (ref 22–32)
Calcium: 8.4 mg/dL — ABNORMAL LOW (ref 8.9–10.3)
Chloride: 106 mmol/L (ref 98–111)
Creatinine, Ser: 0.77 mg/dL (ref 0.44–1.00)
GFR, Estimated: >60 mL/min (ref >60)
Glucose, Bld: 90 mg/dL (ref 70–99)
Potassium: 3.8 mmol/L (ref 3.5–5.1)
Sodium: 137 mmol/L (ref 135–145)
Total Bilirubin: 0.4 mg/dL (ref 0.3–1.2)
Total Protein: 5.3 g/dL — ABNORMAL LOW (ref 6.5–8.1)

## 2020-09-18 LAB — TYPE AND SCREEN
ABO/RH(D): O POS
Antibody Screen: NEGATIVE
Unit division: 0

## 2020-09-18 LAB — GLUCOSE, CAPILLARY
Glucose-Capillary: 87 mg/dL (ref 70–99)
Glucose-Capillary: 137 mg/dL — ABNORMAL HIGH (ref 70–99)

## 2020-09-18 MED ORDER — OXYCODONE HCL 5 MG PO TABS: 5.0000 mg | ORAL_TABLET | Freq: Four times a day (QID) | ORAL | 0 refills | Status: DC | PRN

## 2020-09-18 MED ORDER — AMOXICILLIN-POT CLAVULANATE 875-125 MG PO TABS: 1.0000 | ORAL_TABLET | Freq: Two times a day (BID) | ORAL | 0 refills | Status: DC

## 2020-09-18 MED ORDER — BISACODYL 10 MG RE SUPP: 10.0000 mg | Freq: Every day | RECTAL | 0 refills | Status: DC | PRN

## 2020-09-18 NOTE — Progress Notes (Signed)
Discharge instructions (including medications) discussed with and copy provided to patient/caregiver 

## 2020-09-18 NOTE — Progress Notes (Signed)
Patient ID: Anne Aguirre, female   DOB: 06/12/1950, 70 y.o.   MRN: [id number] [facility] Progress Note:   * No surgery found *  Subjective: Mental status is clear.  Complaints minimal today. Objective: Vital signs in last 24 hours: Temp:  [98 F (36.7 C)-98.8 F (37.1 C)] 98.5 F (36.9 C) (09/05 0752) Pulse Rate:  [86-91] 90 (09/05 0752) Resp:  [16] 16 (09/05 0752) BP: (95-124)/(61-76) 116/76 (09/05 0752) SpO2:  [98 %-100 %] 98 % (09/05 0752)  Intake/Output from previous day: 09/04 0701 - 09/05 0700 In: 903.1 [P.O.:480; I.V.:10; Blood:315; IV Piggyback:98.1] Out: 200 [Stool:200] Intake/Output this shift: Total I/O In: -  Out: 550 [Urine:550]  Physical Exam: Work of breathing is not labored.  She has been up walking in her room.  Working with Facilities manager.    Lab Results:  Results for orders placed or performed during the hospital encounter of 09/10/20 (from the past 48 hour(s))  Glucose, capillary     Status: None   Collection Time: 09/16/20 11:53 AM  Result Value Ref Range   Glucose-Capillary 79 70 - 99 mg/dL    Comment: Glucose reference range applies only to samples taken after fasting for at least 8 hours.  Glucose, capillary     Status: None   Collection Time: 09/16/20  5:05 PM  Result Value Ref Range   Glucose-Capillary 85 70 - 99 mg/dL    Comment: Glucose reference range applies only to samples taken after fasting for at least 8 hours.  Glucose, capillary     Status: Abnormal   Collection Time: 09/16/20  9:55 PM  Result Value Ref Range   Glucose-Capillary 110 (H) 70 - 99 mg/dL    Comment: Glucose reference range applies only to samples taken after fasting for at least 8 hours.  CBC with Differential/Platelet     Status: Abnormal   Collection Time: 09/17/20  2:01 AM  Result Value Ref Range   WBC 11.6 (H) 4.0 - 10.5 K/uL   RBC 2.38 (L) 3.87 - 5.11 MIL/uL   Hemoglobin 7.4 (L) 12.0 - 15.0 g/dL   HCT 51.7 (L) 61.6 - 07.3 %   MCV 96.6 80.0 - 100.0  fL   MCH 31.1 26.0 - 34.0 pg   MCHC 32.2 30.0 - 36.0 g/dL   RDW 71.0 62.6 - 94.8 %   Platelets 518 (H) 150 - 400 K/uL   nRBC 0.0 0.0 - 0.2 %   Neutrophils Relative % 77 %   Neutro Abs 9.1 (H) 1.7 - 7.7 K/uL   Lymphocytes Relative 13 %   Lymphs Abs 1.5 0.7 - 4.0 K/uL   Monocytes Relative 5 %   Monocytes Absolute 0.6 0.1 - 1.0 K/uL   Eosinophils Relative 4 %   Eosinophils Absolute 0.4 0.0 - 0.5 K/uL   Basophils Relative 0 %   Basophils Absolute 0.0 0.0 - 0.1 K/uL   Immature Granulocytes 1 %   Abs Immature Granulocytes 0.09 (H) 0.00 - 0.07 K/uL    Comment: Performed at [facility], [address], [place], [place] [address]  Basic metabolic panel     Status: Abnormal   Collection Time: 09/17/20  2:01 AM  Result Value Ref Range   Sodium 134 (L) 135 - 145 mmol/L   Potassium 3.6 3.5 - 5.1 mmol/L   Chloride 104 98 - 111 mmol/L   CO2 23 22 - 32 mmol/L   Glucose, Bld 87 70 - 99 mg/dL    Comment: Glucose  reference range applies only to samples taken after fasting for at least 8 hours.   BUN <5 (L) 8 - 23 mg/dL   Creatinine, Ser 6.80 0.44 - 1.00 mg/dL   Calcium 8.1 (L) 8.9 - 10.3 mg/dL   GFR, Estimated >32 >12 mL/min    Comment: (NOTE) Calculated using the CKD-EPI Creatinine Equation (2021)    Anion gap 7 5 - 15    Comment: Performed at [facility], [address] [place], [place] [address]  Glucose, capillary     Status: None   Collection Time: 09/17/20  8:17 AM  Result Value Ref Range   Glucose-Capillary 85 70 - 99 mg/dL    Comment: Glucose reference range applies only to samples taken after fasting for at least 8 hours.  Type and screen [facility]     Status: None   Collection Time: 09/17/20 10:32 AM  Result Value Ref Range   ABO/RH(D) O POS    Antibody Screen NEG    Sample Expiration 09/20/2020,2359    Unit Number [id number]    Blood Component Type RED CELLS,LR    Unit division 00    Status of Unit ISSUED,FINAL    Transfusion Status OK  TO TRANSFUSE    Crossmatch Result      Compatible Performed at [facility], [address] [place], [place] [address]   Prepare RBC (crossmatch)     Status: None   Collection Time: 09/17/20 10:32 AM  Result Value Ref Range   Order Confirmation      ORDER PROCESSED BY BLOOD BANK Performed at [facility], [address], [place], [place] [address]   Glucose, capillary     Status: Abnormal   Collection Time: 09/17/20 11:55 AM  Result Value Ref Range   Glucose-Capillary 111 (H) 70 - 99 mg/dL    Comment: Glucose reference range applies only to samples taken after fasting for at least 8 hours.  Glucose, capillary     Status: None   Collection Time: 09/17/20  4:24 PM  Result Value Ref Range   Glucose-Capillary 82 70 - 99 mg/dL    Comment: Glucose reference range applies only to samples taken after fasting for at least 8 hours.  Glucose, capillary     Status: None   Collection Time: 09/17/20  9:21 PM  Result Value Ref Range   Glucose-Capillary 91 70 - 99 mg/dL    Comment: Glucose reference range applies only to samples taken after fasting for at least 8 hours.  Comprehensive metabolic panel     Status: Abnormal   Collection Time: 09/18/20  1:23 AM  Result Value Ref Range   Sodium 137 135 - 145 mmol/L   Potassium 3.8 3.5 - 5.1 mmol/L   Chloride 106 98 - 111 mmol/L   CO2 16 (L) 22 - 32 mmol/L   Glucose, Bld 90 70 - 99 mg/dL    Comment: Glucose reference range applies only to samples taken after fasting for at least 8 hours.   BUN 7 (L) 8 - 23 mg/dL   Creatinine, Ser 0.34 0.44 - 1.00 mg/dL   Calcium 8.4 (L) 8.9 - 10.3 mg/dL   Total Protein 5.3 (L) 6.5 - 8.1 g/dL   Albumin 1.7 (L) 3.5 - 5.0 g/dL   AST 30 15 - 41 U/L   ALT 26 0 - 44 U/L   Alkaline Phosphatase 146 (H) 38 - 126 U/L   Total Bilirubin 0.4 0.3 - 1.2  mg/dL   GFR, Estimated >16>60 >10>60 mL/min    Comment: (NOTE) Calculated using the CKD-EPI Creatinine Equation (2021)    Anion gap 15 5 - 15    Comment:  Performed at [facility], [address] [place], [place] [address]  CBC with Differential/Platelet     Status: Abnormal   Collection Time: 09/18/20  6:53 AM  Result Value Ref Range   WBC 10.0 4.0 - 10.5 K/uL   RBC 3.06 (L) 3.87 - 5.11 MIL/uL   Hemoglobin 9.0 (L) 12.0 - 15.0 g/dL   HCT 40.927.8 (L) 81.136.0 - 91.446.0 %   MCV 90.8 80.0 - 100.0 fL   MCH 29.4 26.0 - 34.0 pg   MCHC 32.4 30.0 - 36.0 g/dL   RDW 78.215.7 (H) 95.611.5 - 21.315.5 %   Platelets 557 (H) 150 - 400 K/uL    Comment: REPEATED TO VERIFY   nRBC 0.0 0.0 - 0.2 %   Neutrophils Relative % 72 %   Neutro Abs 7.3 1.7 - 7.7 K/uL   Lymphocytes Relative 16 %   Lymphs Abs 1.6 0.7 - 4.0 K/uL   Monocytes Relative 8 %   Monocytes Absolute 0.8 0.1 - 1.0 K/uL   Eosinophils Relative 3 %   Eosinophils Absolute 0.3 0.0 - 0.5 K/uL   Basophils Relative 0 %   Basophils Absolute 0.0 0.0 - 0.1 K/uL   Immature Granulocytes 1 %   Abs Immature Granulocytes 0.06 0.00 - 0.07 K/uL    Comment: Performed at [facility], [address] [place], [place] [address]  Glucose, capillary     Status: None   Collection Time: 09/18/20  7:48 AM  Result Value Ref Range   Glucose-Capillary 87 70 - 99 mg/dL    Comment: Glucose reference range applies only to samples taken after fasting for at least 8 hours.    Radiology/Results: CT ABDOMEN PELVIS W CONTRAST  Result Date: 09/16/2020 CLINICAL DATA:  Abdominal abscess/infection suspected. Follow up abscess evaluation. EXAM: CT ABDOMEN AND PELVIS WITH CONTRAST TECHNIQUE: Multidetector CT imaging of the abdomen and pelvis was performed using the standard protocol following bolus administration of intravenous contrast. CONTRAST:  75mL OMNIPAQUE IOHEXOL 350 MG/ML SOLN COMPARISON:  Abdominopelvic CT 09/10/2020 and 08/25/2020. FINDINGS: Lower chest: Right-greater-than-left pleural effusions are again noted with associated bibasilar atelectasis. The left pleural effusion and left basilar atelectasis have mildly worsened. No  significant pericardial fluid. Prominent coronary artery atherosclerosis is noted. There are bilateral breast implants. Hepatobiliary: Scattered hepatic cysts are stable. Focal subcapsular fluid collection along the lateral dome of the right hepatic lobe measuring 4.2 x 2.2 cm on image 11/3 is unchanged. No evidence of gallstones, gallbladder wall thickening or biliary dilatation. Pancreas: Unremarkable. No pancreatic ductal dilatation or surrounding inflammatory changes. Spleen: Normal in size without focal abnormality. Adrenals/Urinary Tract: Both adrenal glands appear normal. Both kidneys appear normal without urinary tract calculus or hydronephrosis. The bladder is moderately distended without focal abnormality. Stomach/Bowel: Enteric contrast was administered and has extended into the distal colostomy. The stomach appears unremarkable for its degree of distention. There is mild small bowel dilatation in the right mid abdomen, but no evidence of bowel obstruction or extravasation. Stable surgical changes from partial left colectomy with a stable appearing Hartman pouch. Vascular/Lymphatic: There are no enlarged abdominal or pelvic lymph nodes. Aortic and branch vessel atherosclerosis. The portal, superior mesenteric and splenic veins are patent. Reproductive: The uterus and ovaries appear normal. No adnexal mass. Other: Open anterior abdominal incision. Diffuse  inflammatory changes throughout the intra-abdominal fat without enlarging focal fluid collection. As above, focal subcapsular fluid along the dome of the right hepatic lobe, unchanged. The collection inferior to the liver appears slightly smaller, measuring 4.5 x 2.9 cm on image 33/3. Fluid anteriorly in the left lower quadrant, inferior to the colostomy is unchanged and appears nonfocal. Small pelvic collections appear improved. Musculoskeletal: No acute or significant osseous findings. Degenerative changes in the spine associated with a convex right  lumbar scoliosis. IMPRESSION: 1. Compared with the most recent study of 6 days ago, the peritoneal collections are stable to slightly improved. No new or enlarging collections are identified. 2. No evidence of extravasated enteric contrast following left colectomy and descending colostomy. No evidence of bowel obstruction. 3. Bilateral pleural effusions and bibasilar atelectasis. The left pleural effusion appears slightly larger. 4.  Aortic Atherosclerosis (ICD10-I70.0). Electronically Signed   By: Carey Bullocks M.D.   On: 09/16/2020 16:41    Anti-infectives: Anti-infectives (From admission, onward)    Start     Dose/Rate Route Frequency Ordered Stop   09/17/20 2200  amoxicillin-clavulanate (AUGMENTIN) 875-125 MG per tablet 1 tablet        1 tablet Oral Every 12 hours 09/17/20 1624     09/10/20 1130  piperacillin-tazobactam (ZOSYN) IVPB 3.375 g  Status:  Discontinued        3.375 g 12.5 mL/hr over 240 Minutes Intravenous Every 8 hours 09/10/20 1127 09/17/20 1624       Assessment/Plan: Problem List: Patient Active Problem List   Diagnosis Date Noted   Sepsis Due to Intraabdomonal Abscess 09/10/2020   COVID-19 virus infection ---- +ve on 09/01/2020 09/10/2020   Post-op intraabdominal/Liver abscess-Had Hartmann's with Colostomy on 08/29/2020 for Sigmoid Diverticulits 09/10/2020   Acute diverticulitis 08/23/2020   Acute renal failure superimposed on stage 3a chronic kidney disease (HCC) 08/23/2020   Hyponatremia 08/23/2020   Hyperbilirubinemia 08/23/2020   Hypercalcemia 08/23/2020   Essential hypertension, benign 10/01/2018   HLD (hyperlipidemia) 10/01/2018   Vitamin D deficiency disease 10/01/2018   Osteoarthritis 10/01/2018    Will recheck CBC today.   * No surgery found *    LOS: 8 days   Matt B. Daphine Deutscher, MD, [facility] [phone] to reach the surgeon on call.    09/18/2020 8:50 AM

## 2020-09-18 NOTE — Plan of Care (Signed)

## 2020-09-18 NOTE — Plan of Care (Signed)

## 2020-09-18 NOTE — Progress Notes (Signed)
Dressing changed and colostomy pouch changed per pt request. Pt tolerated well. Pt to d/c home with husband.

## 2020-09-18 NOTE — TOC Transition Note (Signed)
Transition of Care [facility]) - CM/SW Discharge Note   Patient Details  Name: Anne Aguirre MRN: [id number] Date of Birth: 1950-07-11  [facility] CM/SW Contact:  Kermit Balo, RN Phone Number: 09/18/2020, 11:17 AM   Clinical Narrative:    Patient is discharging home with [facility] services through [place]. Cory with [facility] aware of d/c home today and CM verified he has spouses number.  Hospital bed for home ordered through [facility]. CM spoke to pts spouse and he has not heard about a delivery day or time. CM called [employer] and they have the bed on hold. CM requested bed be delivered today as pt is being d/ced today. They are going to expedite the delivery and have reached out to the patients spouse. Spouse will update CM once her hears about a time.  Spouse to provide transport home for the patient.  Bedside RN updated.    Final next level of care: Home w Home Health Services Barriers to Discharge: No Barriers Identified   Patient Goals and CMS Choice   CMS Medicare.gov Compare Post Acute Care list provided to:: Patient Represenative (must comment) Choice offered to / list presented to : Spouse  Discharge Placement                       Discharge Plan and Services   Discharge Planning Services: CM Consult Post Acute Care Choice: Home Health          DME Arranged: Hospital bed DME Agency: [employer] Date DME Agency Contacted: 09/18/20 Time DME Agency Contacted: [phone] Representative spoke with at DME Agency: Jasmin HH Arranged: PT, OT, RN HH Agency: [facility] Date [facility] Agency Contacted: 09/18/20 Time HH Agency Contacted: [phone] Representative spoke with at [facility] Agency: Kandee Keen  Social Determinants of Health (SDOH) Interventions     Readmission Risk Interventions No flowsheet data found.

## 2020-09-18 NOTE — Discharge Summary (Addendum)
Physician Discharge Summary  Anne Aguirre ZOX:[id number] DOB: February 22, 1950 DOA: 09/10/2020  PCP: [name], [name], MD  Admit date: 09/10/2020 Discharge date: 09/18/2020  Time spent: 38 minutes  Recommendations for Outpatient Follow-up:  Needs CBC differential and Chem-12 in about 1 week Outpatient wound care as well as surgical follow-up per general surgery  Discharge Diagnoses:  MAIN problem for hospitalization   intra-abdominal abscesses on admission in the setting of prior Hartmann procedure 8/16--nonsurgically managed  Please see below for itemized issues addressed in HOpsital- refer to other progress notes for clarity if needed  Discharge Condition: Improved  Diet recommendation: Heart healthy  Filed Weights   09/10/20 0901 09/10/20 0927  Weight: 72 kg 69.5 kg    History of present illness:  70 year old white female community dwelling HTN, recent hospitalization 8/10 through 8/26 sigmoid diverticulitis with local abscess-underwent Hartmann procedure 8/16--diagnosed at that time of COVID-19 infection--Rx with remdesivir steroids completed 7 days of IV steroids   Return to [facility] with fever 101 finished antibiotics 8/26--CT scan showed intra abdominal and liver abscess WBC of 30 lactic acid not elevated-circumscribed fluid collection right upper quadrant IR consulted but collection RUQ not amenable for percutaneous drain and patient was managed conservatively   Patient was kept on IV antibiotics C. difficile testing was negative repeat CT as below negative for abscess  She was stabilized for discharge on 9/5  Hospital Course:  Sepsis on admission secondary sigmoid diverticulitis + abscess status post Hartman's 8/16 Last fever was on 8/28 and patient is steadily improved over the past several days with recent resolution of leukocytosis CT reviewed by general surgery 9/3 -prescription given on discharge Pain medication prescribed on discharge in addition shows no abscess,  transition Zosyn to Augmentin until 9/6 [10 d] We have ordered hospital bed multiple DME needs including pouches for her ostomy and she will be reassessed in the outpatient setting by general surgery Wound care would include wet-to-dry dressings as per instructions below Nutritional anemia, normocytic full --possible dilution component as was on fluids which are now saline locked General surgery transfusing 1 unit of blood--acceptable rise to 9 after transfusion from 7.4-outpatient labs Diarrhea CDIFF tested on 9/1 and is neg- Recent COVID-19 infection Recent Rx steroids-no hypoxia and asymptomatic DC precautions Hyponatremia hypokalemia Hypokalemia improved-Replaced and re-check periodically HTN Continue amlodipine 10, Bystolic 20  Procedures: Multiple CT scans of the abdomen pelvis Consultations: General surgery IR  Discharge Exam: Vitals:   09/18/20 0418 09/18/20 0752  BP: 124/75 116/76  Pulse: 88 90  Resp: 16 16  Temp: 98.6 F (37 C) 98.5 F (36.9 C)  SpO2: 98% 98%    Subj on day of d/c   Doing well ambulatory; no distress pain is controlled some mild diarrhea no chest pain no fever no chills  General Exam on discharge  Awake coherent pleasant no distress mild pallor no icterus S1-S2 no murmur no rub no gallop Abdomen slight distention wounds not examined ostomy in left lower quadrant Trace edema ROM intact no focal deficit   Discharge Instructions   Discharge Instructions     Diet - low sodium heart healthy   Complete by: As directed    Discharge instructions   Complete by: As directed    Complete Augmentin as per instructions-continue to eat solid foods-that some of the supplements and shakes can occasionally cause diarrhea by himself Hospital bed has been ordered several days prior to discharge and should arrive when we can coordinate this-thank you for your understanding You will  need lab work in addition to surgical follow-up in the outpatient setting  with regards to wound care etc. and cultures etc. should be coordinated with transition team who will discuss these supplies etc. with you   Increase activity slowly   Complete by: As directed    No wound care   Complete by: As directed    Please follow directions as per general surgery--clean wound with normal saline pat gently dry fill defect with saline moistened rolled gauze top with dry gauze ABD pads and secure with tape change as needed for swelling otherwise twice daily      Allergies as of 09/18/2020   No Known Allergies      Medication List     STOP taking these medications    irbesartan 300 MG tablet Commonly known as: AVAPRO       TAKE these medications    amLODipine 10 MG tablet Commonly known as: NORVASC Take 1 tablet (10 mg total) by mouth daily.   amoxicillin-clavulanate 875-125 MG tablet Commonly known as: AUGMENTIN Take 1 tablet by mouth every 12 (twelve) hours.   bisacodyl 10 MG suppository Commonly known as: DULCOLAX Place 1 suppository (10 mg total) rectally daily as needed for moderate constipation.   Nebivolol HCl 20 MG Tabs Take 1 tablet (20 mg total) by mouth daily at 12 noon.   oxyCODONE 5 MG immediate release tablet Commonly known as: Oxy IR/ROXICODONE Take 1-2 tablets (5-10 mg total) by mouth every 6 (six) hours as needed for breakthrough pain. What changed: how much to take               Durable Medical Equipment  (From admission, onward)           Start     Ordered   09/15/20 1611  For home use only DME Hospital bed  Once       Comments: Call Sung AmabileStephen Hardgrove [phone] for delivery Thanks  Question Answer Comment  Length of Need Lifetime   Patient has (list medical condition): Sepsis on admission secondary sigmoid diverticulitis + abscess status post Hartman's 8/16   The above medical condition requires: Patient requires the ability to reposition frequently   Bed type Semi-electric   Support Surface: Gel Overlay       09/15/20 1611           No Known Allergies    The results of significant diagnostics from this hospitalization (including imaging, microbiology, ancillary and laboratory) are listed below for reference.    Significant Diagnostic Studies: CT ABDOMEN PELVIS WO CONTRAST  Result Date: 08/23/2020 CLINICAL DATA:  Lower abdominal pain EXAM: CT ABDOMEN AND PELVIS WITHOUT CONTRAST TECHNIQUE: Multidetector CT imaging of the abdomen and pelvis was performed following the standard protocol without IV contrast. COMPARISON:  None. FINDINGS: Lower chest: Lung bases demonstrate no acute consolidation or effusion. Partially visualized breast prostheses. Mild circumferential thickening of the distal esophagus. Hepatobiliary: Hypodense liver lesions likely cysts. Additional subcentimeter hypodensities too small to further characterize. No calcified gallstone or biliary dilatation Pancreas: Unremarkable. No pancreatic ductal dilatation or surrounding inflammatory changes. Spleen: Normal in size without focal abnormality. Adrenals/Urinary Tract: Adrenal glands are normal. Kidneys show no hydronephrosis. The bladder is slightly thick walled Stomach/Bowel: The stomach is nonenlarged. No dilated small bowel. Fluid distension of the colon. Mild wall thickening with inflammatory change at the sigmoid colon with diverticula present. Vascular/Lymphatic: Mild to moderate aortic atherosclerosis. No aneurysm. No suspicious nodes. Reproductive: Uterus and bilateral adnexa are unremarkable. Other:  No free air.  Small fluid in the pelvis Musculoskeletal: No acute osseous abnormality. Degenerative changes of the spine. IMPRESSION: 1. Mild wall thickening with associated inflammatory change at sigmoid colon with diverticula present, likely reflecting an acute diverticulitis. No definite perforation or abscess. 2. Moderate fluid distension of the colon upstream to the area of sigmoid colon inflammation. No evidence for small bowel  obstruction. 3. Mild circumferential distal esophageal thickening questionable for reflux or esophagitis. Electronically Signed   By: Jasmine Pang M.D.   On: 08/23/2020 16:34   DG Chest 2 View  Result Date: 08/23/2020 CLINICAL DATA:  Nausea vomiting EXAM: CHEST - 2 VIEW COMPARISON:  None. FINDINGS: The heart size and mediastinal contours are within normal limits. Both lungs are clear. The visualized skeletal structures are unremarkable. IMPRESSION: No active cardiopulmonary disease. Electronically Signed   By: Jasmine Pang M.D.   On: 08/23/2020 16:34   CT Angio Chest Pulmonary Embolism (PE) W or WO Contrast  Result Date: 09/02/2020 CLINICAL DATA:  Hypoxia.  COVID-19 infection. EXAM: CT ANGIOGRAPHY CHEST WITH CONTRAST TECHNIQUE: Multidetector CT imaging of the chest was performed using the standard protocol during bolus administration of intravenous contrast. Multiplanar CT image reconstructions and MIPs were obtained to evaluate the vascular anatomy. CONTRAST:  52mL OMNIPAQUE IOHEXOL 350 MG/ML SOLN COMPARISON:  Chest radiograph, 08/31/2020. FINDINGS: Cardiovascular: Pulmonary arteries are satisfactorily opacified. Study mildly limited due to low lung volumes and respiratory motion. Allowing for this, there is no evidence of a pulmonary embolism. Heart is normal in size. No pericardial effusion. Three-vessel coronary artery calcifications. Great vessels are normal in caliber. Mild aortic atherosclerosis. No dissection. Mediastinum/Nodes: No enlarged mediastinal, hilar, or axillary lymph nodes. Thyroid gland, trachea, and esophagus demonstrate no significant findings. Right sided PICC has its tip just entering the right atrium. Lungs/Pleura: Moderate right pleural effusion. Trace left pleural fluid. There is a sub pulmonic loculated component of the pleural effusion that contains non dependent air, and measures 10 x 8 x 2.2 cm. This could potentially reside underneath the hemidiaphragm. It does create a  concave indentation of the underlying liver contour. Right lower lobe is collapsed. There is partial atelectasis of the left lower lobe. Patchy areas of predominantly ground-glass opacity are noted bilaterally, the most apparent in the peripheral left upper lobe. Additional mild linear atelectasis is noted at the bases of the left upper lobe lingula and right middle lobe. No pneumothorax. Upper Abdomen: Air-fluid levels noted in the stomach visualized right upper quadrant bowel. Small amount of ascites. Musculoskeletal: No fracture or acute finding.  No bone lesion. Review of the MIP images confirms the above findings. IMPRESSION: 1. No evidence of a pulmonary embolism. 2. Peripheral areas of ground-glass opacity in the lungs consistent with multifocal pneumonia, pattern also consistent with viral/COVID-19 infection. 3. Complete right and partial left lower lobe atelectasis. 4. Moderate right and trace left pleural effusions. 5. Right base fluid collection containing nondependent air, which may be a sub pulmonic loculated fusion or subdiaphragmatic in location. If this is subdiaphragmatic, the source of the non dependent air is unclear. The non dependent air favors a subpulmonic location. 6. Coronary artery calcifications and mild aortic atherosclerosis. Aortic Atherosclerosis (ICD10-I70.0). Electronically Signed   By: Amie Portland M.D.   On: 09/02/2020 20:31   CT ABDOMEN PELVIS W CONTRAST  Result Date: 09/16/2020 CLINICAL DATA:  Abdominal abscess/infection suspected. Follow up abscess evaluation. EXAM: CT ABDOMEN AND PELVIS WITH CONTRAST TECHNIQUE: Multidetector CT imaging of the abdomen and pelvis was performed using  the standard protocol following bolus administration of intravenous contrast. CONTRAST:  75mL OMNIPAQUE IOHEXOL 350 MG/ML SOLN COMPARISON:  Abdominopelvic CT 09/10/2020 and 08/25/2020. FINDINGS: Lower chest: Right-greater-than-left pleural effusions are again noted with associated bibasilar  atelectasis. The left pleural effusion and left basilar atelectasis have mildly worsened. No significant pericardial fluid. Prominent coronary artery atherosclerosis is noted. There are bilateral breast implants. Hepatobiliary: Scattered hepatic cysts are stable. Focal subcapsular fluid collection along the lateral dome of the right hepatic lobe measuring 4.2 x 2.2 cm on image 11/3 is unchanged. No evidence of gallstones, gallbladder wall thickening or biliary dilatation. Pancreas: Unremarkable. No pancreatic ductal dilatation or surrounding inflammatory changes. Spleen: Normal in size without focal abnormality. Adrenals/Urinary Tract: Both adrenal glands appear normal. Both kidneys appear normal without urinary tract calculus or hydronephrosis. The bladder is moderately distended without focal abnormality. Stomach/Bowel: Enteric contrast was administered and has extended into the distal colostomy. The stomach appears unremarkable for its degree of distention. There is mild small bowel dilatation in the right mid abdomen, but no evidence of bowel obstruction or extravasation. Stable surgical changes from partial left colectomy with a stable appearing Hartman pouch. Vascular/Lymphatic: There are no enlarged abdominal or pelvic lymph nodes. Aortic and branch vessel atherosclerosis. The portal, superior mesenteric and splenic veins are patent. Reproductive: The uterus and ovaries appear normal. No adnexal mass. Other: Open anterior abdominal incision. Diffuse inflammatory changes throughout the intra-abdominal fat without enlarging focal fluid collection. As above, focal subcapsular fluid along the dome of the right hepatic lobe, unchanged. The collection inferior to the liver appears slightly smaller, measuring 4.5 x 2.9 cm on image 33/3. Fluid anteriorly in the left lower quadrant, inferior to the colostomy is unchanged and appears nonfocal. Small pelvic collections appear improved. Musculoskeletal: No acute or  significant osseous findings. Degenerative changes in the spine associated with a convex right lumbar scoliosis. IMPRESSION: 1. Compared with the most recent study of 6 days ago, the peritoneal collections are stable to slightly improved. No new or enlarging collections are identified. 2. No evidence of extravasated enteric contrast following left colectomy and descending colostomy. No evidence of bowel obstruction. 3. Bilateral pleural effusions and bibasilar atelectasis. The left pleural effusion appears slightly larger. 4.  Aortic Atherosclerosis (ICD10-I70.0). Electronically Signed   By: Carey Bullocks M.D.   On: 09/16/2020 16:41   CT ABDOMEN PELVIS W CONTRAST  Result Date: 09/10/2020 CLINICAL DATA:  Abdominal pain, fever. Recent surgery for diverticulitis with abscess. New onset fevers and abdominal pain this morning. EXAM: CT ABDOMEN AND PELVIS WITH CONTRAST TECHNIQUE: Multidetector CT imaging of the abdomen and pelvis was performed using the standard protocol following bolus administration of intravenous contrast. CONTRAST:  75mL OMNIPAQUE IOHEXOL 350 MG/ML SOLN COMPARISON:  CT abdomen dated 08/25/2020. FINDINGS: Lower chest: Small RIGHT pleural effusion with compressive atelectasis. Additional mild bibasilar scarring/atelectasis. Hepatobiliary: Hepatic cysts. Gallbladder is unremarkable. Nose significant bile duct dilatation appreciated. Pancreas: Unremarkable. No pancreatic ductal dilatation or surrounding inflammatory changes. Spleen: Normal in size without focal abnormality. Adrenals/Urinary Tract: Adrenal glands appear normal. Kidneys are unremarkable without suspicious mass, stone or hydronephrosis. Stomach/Bowel: Surgical changes of a partial colon resection in the posterior pelvis and LEFT abdominal wall ostomy creation. Mildly distended gas-filled loops of small bowel are seen within the RIGHT abdomen, likely ileus. Thickening of the walls of the small bowel in the RIGHT abdomen and upper pelvis  is likely reactive in nature. Vascular/Lymphatic: Aortic atherosclerosis. No acute appearing vascular abnormality. No enlarged lymph nodes are identified. Reproductive: Uterus  and bilateral adnexa are unremarkable. Other: Small amount of free fluid scattered throughout the abdomen and pelvis. Slightly more circumscribed fluid collection within the RIGHT upper quadrant, underlying the RIGHT liver lobe and extending into the upper paracolic gutter, worrisome for early developing abscess, measuring approximately 5 cm greatest dimension (series 2, image 32; coronal series 5, images 51 through 59). Additional fluid is seen over the RIGHT liver lobe, possibly contiguous. No free intraperitoneal air. No evidence of acute hemorrhage within the abdomen or pelvis. Musculoskeletal: Degenerative spondylosis of the scoliotic lower lumbar spine, at least moderate in degree. No acute-appearing osseous abnormality. Surgical defect within the anterior abdominal wall, with associated packing. IMPRESSION: 1. Circumscribed fluid collection within the RIGHT upper quadrant, underlying the RIGHT liver lobe and extending into the upper RIGHT paracolic gutter, worrisome for early developing abscess, measuring approximately 5 cm greatest dimension. Additional fluid is seen over the RIGHT liver lobe, possibly contiguous. 2. No additional circumscribed fluid collection or abscess-like collection is seen within the abdomen or pelvis. Expected small amount of free fluid scattered throughout the abdomen and pelvis. No evidence of acute hemorrhage within the abdomen or pelvis. 3. Surgical changes of a partial colon resection in the posterior pelvis and LEFT abdominal wall ostomy creation. 4. Mildly distended gas-filled loops of small bowel within the RIGHT abdomen, likely ileus. 5. Thickening of the walls of the small bowel in the RIGHT abdomen and upper pelvis, likely reactive in nature. 6. Small RIGHT pleural effusion with compressive  atelectasis. Aortic Atherosclerosis (ICD10-I70.0). Electronically Signed   By: Bary Richard M.D.   On: 09/10/2020 10:40   CT ABDOMEN PELVIS W CONTRAST  Result Date: 08/25/2020 CLINICAL DATA:  Abdominal distension and elevated white blood cell count EXAM: CT ABDOMEN AND PELVIS WITH CONTRAST TECHNIQUE: Multidetector CT imaging of the abdomen and pelvis was performed using the standard protocol following bolus administration of intravenous contrast. CONTRAST:  85mL OMNIPAQUE IOHEXOL 350 MG/ML SOLN COMPARISON:  08/23/2020 FINDINGS: Lower chest: Lung bases demonstrate bibasilar consolidation with small effusions new from the prior exam. Bilateral breast implants are noted. Hepatobiliary: Scattered hypodensities are again seen throughout the liver consistent with cysts. Gallbladder demonstrates some minimal dependent density likely related to sludge. No definitive calcified stones are noted. No biliary ductal dilatation is seen. Pancreas: Unremarkable. No pancreatic ductal dilatation or surrounding inflammatory changes. Spleen: Normal in size without focal abnormality. Adrenals/Urinary Tract: Adrenal glands are within normal limits. Kidneys demonstrate a normal enhancement pattern bilaterally. Normal excretion of contrast is seen. A few small cysts are noted. Ureters are within normal limits. Bladder is partially distended. Stomach/Bowel: Colons demonstrates scattered diverticular change is well as some inflammatory change in the region of the sigmoid these changes are similar to that noted on the prior exam. Stomach is well distended with contrast material. The small bowel is prominent with multiple fluid-filled dilated loops of small bowel identified. Some peristalsis is noted within the small bowel although it appears to be dilated to the level of mid ileum without discrete transition zone. Decreased bowel sounds are noted on physical exam in these changes may represent a generalized small bowel ileus.  Vascular/Lymphatic: Aortic atherosclerosis. No enlarged abdominal or pelvic lymph nodes. Reproductive: Uterus and bilateral adnexa are unremarkable. Other: There is a new air-fluid collection identified in the right hemipelvis best seen on image number 74 of series 2 this is new from the prior exam and demonstrates a small amount of air within. This measures 5.8 x 4.3 cm in greatest dimension. Given  the inflammatory changes and diverticulitis this likely represents a developing abscess from diverticular rupture. No other focal fluid collection is noted. Musculoskeletal: Degenerative changes of the lumbar spine are noted. IMPRESSION: New air-fluid collection in the right hemipelvis likely representing a developing abscess. This is most likely related to the underlying diverticulitis and possible diverticular rupture. Generalized small bowel dilatation with fluid-filled loops of small bowel. On clinical exam there are decreased bowel sounds in these changes may represent a reactive small bowel ileus. Continued follow-up is recommended. New bilateral lower lobe consolidation with associated effusions consistent with atelectasis/early infiltrate. Electronically Signed   By: Alcide Clever M.D.   On: 08/25/2020 17:12   DG Chest Port 1 View  Result Date: 09/10/2020 CLINICAL DATA:  Fever. EXAM: PORTABLE CHEST 1 VIEW COMPARISON:  September 06, 2020. FINDINGS: The heart size and mediastinal contours are within normal limits. Hypoinflation of the lungs is noted. Mild bibasilar subsegmental atelectasis is noted. There is interval development of patchy opacity seen laterally in the left lung concerning for atypical inflammation. Probable small right pleural effusion. The visualized skeletal structures are unremarkable. IMPRESSION: Hypoinflation of the lungs is noted with mild bibasilar subsegmental atelectasis. Probable small right pleural effusion is noted. There is interval development of patchy opacities seen laterally in the  left lung concerning for atypical inflammation. Electronically Signed   By: Lupita Raider M.D.   On: 09/10/2020 09:56   DG CHEST PORT 1 VIEW  Result Date: 09/06/2020 CLINICAL DATA:  Follow-up COVID pneumonia. EXAM: PORTABLE CHEST 1 VIEW COMPARISON:  CT chest 09/02/2020; chest radiograph 08/31/2020 FINDINGS: Right upper extremity PICC tip projects at the level of the superior cavoatrial junction. Persistent small right pleural effusion with right lower lobe atelectasis. A few small locules of air project inferior to the right hemidiaphragm and likely correspond to the air noted in the probable subpulmonic fluid collection on recent CT 09/02/2020. Persistent streaky opacities in the left lower lobe, likely subsegmental atelectasis. The peripheral areas of ground-glass opacity seen on prior CT are not appreciated on today's exam. No new focal consolidation. Heart size is difficult to assess due to adjacent consolidation. Mild aortic calcifications. IMPRESSION: 1. Small right pleural effusion with associated right lower lobe atelectasis. A few small locules of air projecting just inferior to the right hemidiaphragm are likely located in the subpulmonic fluid collection seen on CT 09/02/2020. 2. Left basilar subsegmental atelectasis, unchanged. 3. No new focal consolidation. Electronically Signed   By: Sherron Ales M.D.   On: 09/06/2020 14:39   DG CHEST PORT 1 VIEW  Result Date: 08/31/2020 CLINICAL DATA:  Shortness of breath EXAM: PORTABLE CHEST 1 VIEW COMPARISON:  Chest x-ray dated August 23, 2020 FINDINGS: Right arm PICC with tip positioned over the expected area of the upper right atrium. Low lung volumes with bibasilar opacities, likely combination of atelectasis and pleural effusions. No evidence of pneumothorax. Visualized cardiac and mediastinal contours are within normal limits. Lucency overlying the left upper quadrant, likely postoperative pneumoperitoneum. IMPRESSION: Right arm PICC with tip  positioned over the expected area of the upper right atrium. Low lung volumes with bibasilar opacities, likely combination of atelectasis and pleural effusions. Electronically Signed   By: Allegra Lai M.D.   On: 08/31/2020 13:45   DG Abd Portable 1V  Result Date: 09/13/2020 CLINICAL DATA:  Abdominal pain, distention, abscess EXAM: PORTABLE ABDOMEN - 1 VIEW COMPARISON:  CT abdomen/pelvis 09/10/2020 FINDINGS: There is mild gaseous distention of the bowel throughout the abdomen without evidence of  mechanical obstruction. There is no definite free intraperitoneal air, though evaluation is limited with single supine radiograph. There is no gross organomegaly or abnormal soft tissue calcification. There is degenerative change in the lower lumbar spine. There is a right pleural effusion, incompletely evaluated. IMPRESSION: Mildly gas distended loops of bowel throughout the abdomen without evidence of mechanical obstruction. Electronically Signed   By: Lesia Hausen M.D.   On: 09/13/2020 13:26   CT IMAGE GUIDED DRAINAGE BY PERCUTANEOUS CATHETER  Result Date: 08/27/2020 INDICATION: 70 year old female referred for possible CT-guided drainage of pelvic fluid, in the setting of acute diverticulitis. EXAM: CT GUIDED DRAINAGE OF PELVIC   ABSCESS DISCONTINUATION OF PROCEDURE MEDICATIONS: None ANESTHESIA/SEDATION: None COMPLICATIONS: None TECHNIQUE: Informed written consent was obtained from the patient after a thorough discussion of the procedural risks, benefits and alternatives. All questions were addressed. Maximal Sterile Barrier Technique was utilized including caps, mask, sterile gowns, sterile gloves, sterile drape, hand hygiene and skin antiseptic. A timeout was performed prior to the initiation of the procedure. PROCEDURE: Patient was position prone position on the CT gantry table for scout CT. Scout CT was performed demonstrating there was a decreasing size of the previous pelvic fluid collection, potentially  read distributed given the scant fluid within the dependent pelvis adjacent to the urinary bladder. No target for aspiration/drainage. FINDINGS: No target for attempted drainage. IMPRESSION: Prone position CT for attempt at a transgluteal aspiration/drainage reveals that the fluid collection is improved/read distributed, with no target for safe percutaneous access. Drainage was deferred. Electronically Signed   By: Gilmer Mor D.O.   On: 08/27/2020 12:22   VAS [place] LOWER EXTREMITY VENOUS (DVT)  Result Date: 09/07/2020  Lower Venous DVT Study Patient Name:  ANTHONELLA KLAUSNER  Date of Exam:   09/07/2020 Medical Rec #: [id number]    Accession #:    [id number] Date of Birth: 08/14/1950     Patient Gender: F Patient Age:   56 years Exam Location:  [facility] Procedure:      VAS [place] LOWER EXTREMITY VENOUS (DVT) Referring Phys: MICHAEL MACZIS --------------------------------------------------------------------------------  Indications: Edema.  Risk Factors: COVID 19 positive. Limitations: Poor ultrasound/tissue interface. Comparison Study: No prior studies. Performing Technologist: Chanda Busing RVT  Examination Guidelines: A complete evaluation includes B-mode imaging, spectral Doppler, color Doppler, and power Doppler as needed of all accessible portions of each vessel. Bilateral testing is considered an integral part of a complete examination. Limited examinations for reoccurring indications may be performed as noted. The reflux portion of the exam is performed with the patient in reverse Trendelenburg.  +---------+---------------+---------+-----------+----------+--------------+ RIGHT    CompressibilityPhasicitySpontaneityPropertiesThrombus Aging +---------+---------------+---------+-----------+----------+--------------+ CFV      Full           Yes      Yes                                 +---------+---------------+---------+-----------+----------+--------------+ SFJ      Full                                                         +---------+---------------+---------+-----------+----------+--------------+ FV Prox  Full                                                        +---------+---------------+---------+-----------+----------+--------------+  FV Mid   Full           Yes      Yes                                 +---------+---------------+---------+-----------+----------+--------------+ FV DistalFull                                                        +---------+---------------+---------+-----------+----------+--------------+ PFV      Full                                                        +---------+---------------+---------+-----------+----------+--------------+ POP      Full           Yes      Yes                                 +---------+---------------+---------+-----------+----------+--------------+ PTV      Full                                                        +---------+---------------+---------+-----------+----------+--------------+ PERO     Full                                                        +---------+---------------+---------+-----------+----------+--------------+   +---------+---------------+---------+-----------+----------+--------------+ LEFT     CompressibilityPhasicitySpontaneityPropertiesThrombus Aging +---------+---------------+---------+-----------+----------+--------------+ CFV      Full           Yes      Yes                                 +---------+---------------+---------+-----------+----------+--------------+ SFJ      Full                                                        +---------+---------------+---------+-----------+----------+--------------+ FV Prox  Full                                                        +---------+---------------+---------+-----------+----------+--------------+ FV Mid   Full                                                         +---------+---------------+---------+-----------+----------+--------------+  FV DistalFull           Yes      Yes                                 +---------+---------------+---------+-----------+----------+--------------+ PFV      Full                                                        +---------+---------------+---------+-----------+----------+--------------+ POP      Full           Yes      Yes                                 +---------+---------------+---------+-----------+----------+--------------+ PTV      Full                                                        +---------+---------------+---------+-----------+----------+--------------+ PERO     Full                                                        +---------+---------------+---------+-----------+----------+--------------+     Summary: RIGHT: - There is no evidence of deep vein thrombosis in the lower extremity. However, portions of this examination were limited- see technologist comments above.  - No cystic structure found in the popliteal fossa.  LEFT: - There is no evidence of deep vein thrombosis in the lower extremity. However, portions of this examination were limited- see technologist comments above.  - No cystic structure found in the popliteal fossa.  *See table(s) above for measurements and observations. Electronically signed by Heath Lark on 09/07/2020 at 5:52:54 PM.    Final    [place] EKG SITE RITE  Result Date: 08/30/2020 If Site Rite image not attached, placement could not be confirmed due to current cardiac rhythm.   Microbiology: Recent Results (from the past 240 hour(s))  Blood Culture (routine x 2)     Status: None   Collection Time: 09/10/20  9:10 AM   Specimen: BLOOD RIGHT FOREARM  Result Value Ref Range Status   Specimen Description   Final    BLOOD RIGHT FOREARM BOTTLES DRAWN AEROBIC AND ANAEROBIC Blood Culture adequate volume   Special Requests NONE  Final   Culture   Final     NO GROWTH 5 DAYS Performed at [facility], [address] [place], [place] [address]    Report Status 09/15/2020 FINAL  Final  Blood Culture (routine x 2)     Status: None   Collection Time: 09/10/20  9:33 AM   Specimen: BLOOD LEFT ARM  Result Value Ref Range Status   Specimen Description BLOOD LEFT ARM BOTTLES DRAWN AEROBIC ONLY  Final   Special Requests Blood Culture adequate volume  Final   Culture   Final    NO GROWTH 5 DAYS Performed at [facility],  [address], [place], [place] [address]    Report Status 09/15/2020 FINAL  Final  Resp Panel by RT-PCR (Flu A&B, Covid) Nasopharyngeal Swab     Status: Abnormal   Collection Time: 09/10/20 11:43 AM   Specimen: Nasopharyngeal Swab; Nasopharyngeal(NP) swabs in vial transport medium  Result Value Ref Range Status   SARS Coronavirus 2 by RT PCR POSITIVE (A) NEGATIVE Final    Comment: CRITICAL RESULT CALLED TO, READ BACK BY AND VERIFIED WITH: DAVIS,N 1311 09/10/2020 COLEMAN,R (NOTE) SARS-CoV-2 target nucleic acids are DETECTED.  The SARS-CoV-2 RNA is generally detectable in upper respiratory specimens during the acute phase of infection. Positive results are indicative of the presence of the identified virus, but do not rule out bacterial infection or co-infection with other pathogens not detected by the test. Clinical correlation with patient history and other diagnostic information is necessary to determine patient infection status. The expected result is Negative.  Fact Sheet for Patients: BloggerCourse.com  Fact Sheet for Healthcare Providers: SeriousBroker.it  This test is not yet approved or cleared by the [place] FDA and  has been authorized for detection and/or diagnosis of SARS-CoV-2 by FDA under an Emergency Use Authorization (EUA).  This EUA will remain in effect (meaning this te st can be used) for the duration of  the COVID-19 declaration under Section  564(b)(1) of the Act, 21 U.S.C. section 360bbb-3(b)(1), unless the authorization is terminated or revoked sooner.     Influenza A by PCR NEGATIVE NEGATIVE Final   Influenza B by PCR NEGATIVE NEGATIVE Final    Comment: (NOTE) The Xpert Xpress SARS-CoV-2/FLU/RSV plus assay is intended as an aid in the diagnosis of influenza from Nasopharyngeal swab specimens and should not be used as a sole basis for treatment. Nasal washings and aspirates are unacceptable for Xpert Xpress SARS-CoV-2/FLU/RSV testing.  Fact Sheet for Patients: BloggerCourse.com  Fact Sheet for Healthcare Providers: SeriousBroker.it  This test is not yet approved or cleared by the [place] FDA and has been authorized for detection and/or diagnosis of SARS-CoV-2 by FDA under an Emergency Use Authorization (EUA). This EUA will remain in effect (meaning this test can be used) for the duration of the COVID-19 declaration under Section 564(b)(1) of the Act, 21 U.S.C. section 360bbb-3(b)(1), unless the authorization is terminated or revoked.  Performed at [facility], [address] [place], [place] [address]   Urine Culture     Status: Abnormal (Preliminary result)   Collection Time: 09/10/20  3:03 PM   Specimen: In/Out Cath Urine  Result Value Ref Range Status   Specimen Description   Final    IN/OUT CATH URINE Performed at [facility], [address] [place], [place] [address]    Special Requests   Final    NONE Performed at [facility], [address] [place], [place] [address]    Culture (A)  Final    10,000 COLONIES/mL KLEBSIELLA PNEUMONIAE CONFIRMED CARBAPENEMASE RESISTANT ENTEROBACTERIACAE MULTI-DRUG RESISTANT ORGANISM CRITICAL RESULT CALLED TO, READ BACK BY AND VERIFIED WITH: Leafy Half MD @1229  09/15/20 EB Performed at [facility], [address], [place], [place] [address]    Report Status PENDING  Incomplete   Organism ID, Bacteria  KLEBSIELLA PNEUMONIAE (A)  Final      Susceptibility   Klebsiella pneumoniae - MIC*    AMPICILLIN >=32 RESISTANT Resistant     CEFAZOLIN >=64 RESISTANT Resistant     CEFEPIME >=32 RESISTANT Resistant     CEFTRIAXONE >=64 RESISTANT Resistant     CIPROFLOXACIN >=  4 RESISTANT Resistant     GENTAMICIN 8 INTERMEDIATE Intermediate     IMIPENEM >=16 RESISTANT Resistant     NITROFURANTOIN >=512 RESISTANT Resistant     TRIMETH/SULFA 80 RESISTANT Resistant     AMPICILLIN/SULBACTAM >=32 RESISTANT Resistant     PIP/TAZO >=128 RESISTANT Resistant     * 10,000 COLONIES/mL KLEBSIELLA PNEUMONIAE  Carbapenem Resistance Panel     Status: Abnormal   Collection Time: 09/10/20  3:03 PM  Result Value Ref Range Status   Carba Resistance IMP Gene NOT DETECTED NOT DETECTED Final   Carba Resistance VIM Gene NOT DETECTED NOT DETECTED Final   Carba Resistance NDM Gene NOT DETECTED NOT DETECTED Final   Carba Resistance KPC Gene DETECTED (A) NOT DETECTED Final    Comment: CRITICAL RESULT CALLED TO, READ BACK BY AND VERIFIED WITH: [facility] MD @1229  09/15/20 EB    Carba Resistance OXA48 Gene DETECTED (A) NOT DETECTED Final    Comment: CRITICAL RESULT CALLED TO, READ BACK BY AND VERIFIED WITH: S,MANANDHAR MD @1229  09/15/20 EB (NOTE) Cepheid Carba-R is an FDA-cleared nucleic acid amplification test  (NAAT)for the detection and differentiation of genes encoding the  most prevalent carbapenemases in bacterial isolate samples. Carbapenemase gene identification and implementation of comprehensive  infection control measures are recommended by the CDC to prevent the  spread of the resistant organisms. Performed at [facility], [address], [place], [address] [place]   C Difficile Quick Screen (NO PCR Reflex)     Status: None   Collection Time: 09/14/20  5:44 PM   Specimen: STOOL  Result Value Ref Range Status   C Diff antigen NEGATIVE NEGATIVE Final   C Diff toxin NEGATIVE NEGATIVE Final   C Diff  interpretation No C. difficile detected.  Final    Comment: Performed at [facility], [address], [place], [address] [place]     Labs: Basic Metabolic Panel: Recent Labs  Lab 09/13/20 0003 09/14/20 0242 09/15/20 0118 09/16/20 0003 09/17/20 0201 09/18/20 0123  NA 131* 131* 135 136 134* 137  K 3.4* 3.4* 3.5 3.9 3.6 3.8  CL 100 102 106 107 104 106  CO2 24 22 21* 24 23 16*  GLUCOSE 101* 117* 99 93 87 90  BUN 5* <5* 5* <5* <5* 7*  CREATININE 0.74 0.74 0.82 0.67 0.70 0.77  CALCIUM 8.0* 7.8* 8.1* 7.8* 8.1* 8.4*  MG 1.4* 1.3* 1.7  --   --   --    Liver Function Tests: Recent Labs  Lab 09/14/20 0242 09/15/20 0118 09/16/20 0003 09/18/20 0123  AST 20 24 30 30   ALT 24 23 25 26   ALKPHOS 69 98 147* 146*  BILITOT 0.5 0.3 0.3 0.4  PROT 4.9* 5.1* 4.8* 5.3*  ALBUMIN 1.6* 1.7* 1.6* 1.7*   No results for input(s): LIPASE, AMYLASE in the last 168 hours. No results for input(s): AMMONIA in the last 168 hours. CBC: Recent Labs  Lab 09/14/20 0242 09/15/20 0118 09/16/20 0003 09/17/20 0201 09/18/20 0653  WBC 13.8* 10.9* 11.1* 11.6* 10.0  NEUTROABS 10.8* 7.8* 7.8* 9.1* 7.3  HGB 7.7* 7.7* 7.1* 7.4* 9.0*  HCT 23.2* 24.2* 22.0* 23.0* 27.8*  MCV 94.7 97.2 96.5 96.6 90.8  PLT 315 359 367 518* 557*   Cardiac Enzymes: No results for input(s): CKTOTAL, CKMB, CKMBINDEX, TROPONINI in the last 168 hours. BNP: BNP (last 3 results) No results for input(s): BNP in the last 8760 hours.  ProBNP (last 3 results) No results for input(s): PROBNP in  the last 8760 hours.  CBG: Recent Labs  Lab 09/17/20 0817 09/17/20 1155 09/17/20 1624 09/17/20 2121 09/18/20 0748  GLUCAP 85 111* 82 91 87       Signed:  Rhetta Mura MD   [facility] 09/18/2020, 9:48 AM

## 2020-09-18 NOTE — Plan of Care (Signed)
  Problem: Education: Goal: Knowledge of General Education information will improve Description: Including pain rating scale, medication(s)/side effects and non-pharmacologic comfort measures Outcome: Adequate for Discharge   

## 2020-09-20 DIAGNOSIS — I129 Hypertensive chronic kidney disease with stage 1 through stage 4 chronic kidney disease, or unspecified chronic kidney disease: Secondary | ICD-10-CM | POA: Diagnosis not present

## 2020-09-20 DIAGNOSIS — T8131XD Disruption of external operation (surgical) wound, not elsewhere classified, subsequent encounter: Secondary | ICD-10-CM | POA: Diagnosis not present

## 2020-09-20 DIAGNOSIS — Z433 Encounter for attention to colostomy: Secondary | ICD-10-CM | POA: Diagnosis not present

## 2020-09-20 DIAGNOSIS — N1831 Chronic kidney disease, stage 3a: Secondary | ICD-10-CM | POA: Diagnosis not present

## 2020-09-20 DIAGNOSIS — D631 Anemia in chronic kidney disease: Secondary | ICD-10-CM | POA: Diagnosis not present

## 2020-09-20 DIAGNOSIS — I700 Atherosclerosis of aorta: Secondary | ICD-10-CM | POA: Diagnosis not present

## 2020-09-20 DIAGNOSIS — I2510 Atherosclerotic heart disease of native coronary artery without angina pectoris: Secondary | ICD-10-CM | POA: Diagnosis not present

## 2020-09-20 DIAGNOSIS — N179 Acute kidney failure, unspecified: Secondary | ICD-10-CM | POA: Diagnosis not present

## 2020-09-20 DIAGNOSIS — T8143XD Infection following a procedure, organ and space surgical site, subsequent encounter: Secondary | ICD-10-CM | POA: Diagnosis not present

## 2020-09-21 ENCOUNTER — Other Ambulatory Visit: Payer: Self-pay

## 2020-09-21 ENCOUNTER — Encounter (HOSPITAL_BASED_OUTPATIENT_CLINIC_OR_DEPARTMENT_OTHER): Payer: Self-pay | Admitting: Family Medicine

## 2020-09-21 ENCOUNTER — Ambulatory Visit (HOSPITAL_BASED_OUTPATIENT_CLINIC_OR_DEPARTMENT_OTHER): Payer: Medicare PPO | Admitting: Family Medicine

## 2020-09-21 VITALS — BP 117/75 | HR 100 | Ht 65.0 in | Wt 120.0 lb

## 2020-09-21 DIAGNOSIS — D649 Anemia, unspecified: Secondary | ICD-10-CM

## 2020-09-21 DIAGNOSIS — Z933 Colostomy status: Secondary | ICD-10-CM | POA: Diagnosis not present

## 2020-09-21 DIAGNOSIS — R5381 Other malaise: Secondary | ICD-10-CM | POA: Diagnosis not present

## 2020-09-21 DIAGNOSIS — K5792 Diverticulitis of intestine, part unspecified, without perforation or abscess without bleeding: Secondary | ICD-10-CM | POA: Diagnosis not present

## 2020-09-21 DIAGNOSIS — I10 Essential (primary) hypertension: Secondary | ICD-10-CM

## 2020-09-21 HISTORY — DX: Colostomy status: Z93.3

## 2020-09-21 NOTE — Assessment & Plan Note (Signed)
Currently doing well with this Has home health coming to the house Needs to arrange for follow-up with general surgery and ostomy clinic Referral to ostomy clinic has been placed, our office will be reaching out to try to arrange for establishing with ostomy clinic as well as arranging follow-up with general surgery as this was not arranged prior to discharge from the hospital

## 2020-09-21 NOTE — Assessment & Plan Note (Signed)
Patient with deconditioning following recent hospitalizations Has home physical therapy arranged, encouraged to continue with this Patient requires therapy services in order to regain strength and stamina and reduce risk of fall or other complications that would negatively affect her health and recovery from recent hospitalizations and surgery.

## 2020-09-21 NOTE — Assessment & Plan Note (Signed)
Noted during recent hospitalization, recommendation for repeat CBC 1 week from discharge to monitor status

## 2020-09-21 NOTE — Progress Notes (Signed)
New Patient Office Visit  Subjective:  Patient ID: Anne Aguirre, female    DOB: 30-Jan-1950  Age: 70 y.o. MRN: [id number]  CC:  Chief Complaint  Patient presents with   Establish Care    Prior PCP is deceased   Hospitalization Follow-up    Patient was seen for abdominal pain and constipation and found out she had diverticulitis and a perforated bowel. She had a part of her small intestine removed and now has an ostomy. She has a large wound on her lower abdomen. She has not been set up with wound care, surgery follow up, ostomy care, or a GI specialist. She is wanting referrals today. She does have [place] home health that comes out twice weekly. Her husband says she weak and is working on her appetite.      HPI Anne Aguirre is a 70 year old female presenting to establish in clinic.  Patient had 2 recent hospitalizations related to diverticulitis and subsequently admitted for intra-abdominal abscess.  Past medical history is significant for hypertension, hyperlipidemia.  Recent hospitalizations: Initially admitted from 8/10-8/26 due to acute sigmoid diverticulitis with perforation and peritonitis.  She eventually underwent Hartmann procedure on 8/16.  She developed ileus postoperatively.  She did complete 10 days of antibiotics.  During this hospitalization she also had breathing difficulties and was diagnosed with COVID-19 infection.  2 days after discharge, patient returned to ED due to persistent fevers and chills.  She was found to have intra-abdominal/liver abscess and notable leukocytosis.  Interventional radiology was consulted, however given location and size of abscesses, medical management was advised.  She was treated with Zosyn and subsequently transition to Augmentin to be taken until 9/6 to complete 10-day course.  Recommendations at discharge were to repeat CBC and CMP about 1 week after discharge.  Since being home, patient has been doing generally well.  Primary concern centered  around arranging follow-up with general surgery, establishing with ostomy clinic, wound care.  She does have some generalized fatigue related to deconditioning due to recent hospitalizations.  Has been having home health come to her house.  Has home physical therapy arranged to start tomorrow.  Hypertension: Managed on amlodipine and nebivolol.  Checks blood pressure intermittently at home.  Recent readings have been around 120/70.  Denies any issues with chest pain, headaches.  Patient is originally from [place], [place].  She is currently retired.  Past Medical History:  Diagnosis Date   Essential hypertension, benign 10/01/2018   HLD (hyperlipidemia) 10/01/2018   Osteoarthritis 10/01/2018   Vitamin D deficiency disease 10/01/2018    Past Surgical History:  Procedure Laterality Date   ABDOMINAL SURGERY     COLON RESECTION N/A 08/29/2020   Procedure: COLON RESECTION;  Surgeon: Axel Filler, MD;  Location: [facility] OR;  Service: General;  Laterality: N/A;   COLON SURGERY     COLOSTOMY N/A 08/29/2020   Procedure: COLOSTOMY;  Surgeon: Axel Filler, MD;  Location[facility] OR;  Service: General;  Laterality: N/A;   LAPAROTOMY N/A 08/29/2020   Procedure: EXPLORATORY LAPAROTOMY;  Surgeon: Axel Filler, MD;  Location: [facility] OR;  Service: General;  Laterality: N/A;   TONSILLECTOMY Bilateral     Family History  Problem Relation Age of Onset   Hypertension Mother    Stroke Mother    Stroke Father    Hypertension Brother    Hypertension Son     Social History   Socioeconomic History   Marital status: Married    Spouse name: Not on file  Number of children: Not on file   Years of education: Not on file   Highest education level: Not on file  Occupational History   Not on file  Tobacco Use   Smoking status: Never   Smokeless tobacco: Never  Vaping Use   Vaping Use: Never used  Substance and Sexual Activity   Alcohol use: Yes    Alcohol/week: 7.0 standard drinks    Types:  7 Glasses of wine per week    Comment: Red wine   Drug use: Never   Sexual activity: Not on file  Other Topics Concern   Not on file  Social History Narrative   Married for 12 years.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   Social Determinants of Health   Financial Resource Strain: Not on file  Food Insecurity: Not on file  Transportation Needs: Not on file  Physical Activity: Not on file  Stress: Not on file  Social Connections: Not on file  Intimate Partner Violence: Not on file    Objective:   Today's Vitals: BP 117/75   Pulse 100   Ht 5\' 5"  (1.651 m)   Wt 120 lb (54.4 kg)   SpO2 100%   BMI 19.97 kg/m   Physical Exam  Pleasant 70 year old female in no acute distress Cardiovascular exam with regular rate and rhythm, no murmurs appreciated Lungs clear to auscultation bilaterally  Assessment & Plan:   Problem List Items Addressed This Visit       Cardiovascular and Mediastinum   Essential hypertension, benign    Blood pressure at goal in office today, controlled on home readings as well Continue with current regimen of amlodipine and nebivolol        Digestive   Acute diverticulitis - Primary   Relevant Orders   CBC with Differential/Platelet   Comprehensive metabolic panel     Other   Colostomy in place [facility])    Currently doing well with this Has home health coming to the house Needs to arrange for follow-up with general surgery and ostomy clinic Referral to ostomy clinic has been placed, our office will be reaching out to try to arrange for establishing with ostomy clinic as well as arranging follow-up with general surgery as this was not arranged prior to discharge from the hospital      Anemia    Noted during recent hospitalization, recommendation for repeat CBC 1 week from discharge to monitor status      Physical deconditioning    Patient with deconditioning following recent hospitalizations Has home physical therapy arranged, encouraged to  continue with this Patient requires therapy services in order to regain strength and stamina and reduce risk of fall or other complications that would negatively affect her health and recovery from recent hospitalizations and surgery.       Outpatient Encounter Medications as of 09/21/2020  Medication Sig   amLODipine (NORVASC) 10 MG tablet Take 1 tablet (10 mg total) by mouth daily.   Nebivolol HCl 20 MG TABS Take 1 tablet (20 mg total) by mouth daily at 12 noon.   oxyCODONE (OXY IR/ROXICODONE) 5 MG immediate release tablet Take 1-2 tablets (5-10 mg total) by mouth every 6 (six) hours as needed for breakthrough pain.   [DISCONTINUED] amoxicillin-clavulanate (AUGMENTIN) 875-125 MG tablet Take 1 tablet by mouth every 12 (twelve) hours.   [DISCONTINUED] bisacodyl (DULCOLAX) 10 MG suppository Place 1 suppository (10 mg total) rectally daily as needed for moderate constipation.   No facility-administered encounter medications on file as of 09/21/2020.  Spent 60 minutes on this patient encounter, including preparation, chart review, face-to-face counseling with patient and coordination of care, and documentation of encounter  Follow-up: Return in about 4 weeks (around 10/19/2020).  Plan for follow-up in about 3 to 4 weeks to monitor progress of above.  Demba Nigh J [name], MD

## 2020-09-21 NOTE — Patient Instructions (Addendum)
  Medication Instructions:  Your physician recommends that you continue on your current medications as directed. Please refer to the Current Medication list given to you today. --If you need a refill on any your medications before your next appointment, please call your pharmacy first. If no refills are authorized on file call the office.--  Lab Work: Your physician has recommended that you have lab work today: CBC and CMP in about 1 week If you have labs (blood work) drawn today and your tests are completely normal, you will receive your results via MyChart message OR a phone call from our staff.  Please ensure you check your voicemail in the event that you authorized detailed messages to be left on a delegated number. If you have any lab test that is abnormal or we need to change your treatment, we will call you to review the results.  Referrals/Procedures/Imaging: -- I will contact the Ostomy Clinic and [facility] to get you scheduled and let you know.   Follow-Up: Your next appointment:   Your physician recommends that you schedule a follow-up appointment in: 3-4 WEEKS with Dr. [name]  Thanks for letting us be apart of your health journey!!  Primary Care and Sports Medicine   Dr. Ceasar Mons [place]   We encourage you to activate your patient portal called "MyChart".  Sign up information is provided on this After Visit Summary.  MyChart is used to connect with patients for Virtual Visits (Telemedicine).  Patients are able to view lab/test results, encounter notes, upcoming appointments, etc.  Non-urgent messages can be sent to your provider as well. To learn more about what you can do with MyChart, please visit --  [email or web address].

## 2020-09-21 NOTE — Assessment & Plan Note (Signed)
Blood pressure at goal in office today, controlled on home readings as well Continue with current regimen of amlodipine and nebivolol

## 2020-09-22 DIAGNOSIS — I2510 Atherosclerotic heart disease of native coronary artery without angina pectoris: Secondary | ICD-10-CM | POA: Diagnosis not present

## 2020-09-22 DIAGNOSIS — D631 Anemia in chronic kidney disease: Secondary | ICD-10-CM | POA: Diagnosis not present

## 2020-09-22 DIAGNOSIS — T8131XD Disruption of external operation (surgical) wound, not elsewhere classified, subsequent encounter: Secondary | ICD-10-CM | POA: Diagnosis not present

## 2020-09-22 DIAGNOSIS — N1831 Chronic kidney disease, stage 3a: Secondary | ICD-10-CM | POA: Diagnosis not present

## 2020-09-22 DIAGNOSIS — T8143XD Infection following a procedure, organ and space surgical site, subsequent encounter: Secondary | ICD-10-CM | POA: Diagnosis not present

## 2020-09-22 DIAGNOSIS — N179 Acute kidney failure, unspecified: Secondary | ICD-10-CM | POA: Diagnosis not present

## 2020-09-22 DIAGNOSIS — I700 Atherosclerosis of aorta: Secondary | ICD-10-CM | POA: Diagnosis not present

## 2020-09-22 DIAGNOSIS — Z433 Encounter for attention to colostomy: Secondary | ICD-10-CM | POA: Diagnosis not present

## 2020-09-22 DIAGNOSIS — I129 Hypertensive chronic kidney disease with stage 1 through stage 4 chronic kidney disease, or unspecified chronic kidney disease: Secondary | ICD-10-CM | POA: Diagnosis not present

## 2020-09-25 ENCOUNTER — Other Ambulatory Visit (HOSPITAL_COMMUNITY)
Admission: RE | Admit: 2020-09-25 | Discharge: 2020-09-25 | Disposition: A | Discharge status: Home / Self Care | Payer: Medicare PPO | Source: Ambulatory Visit | Attending: Family Medicine | Admitting: Family Medicine

## 2020-09-25 ENCOUNTER — Telehealth (HOSPITAL_BASED_OUTPATIENT_CLINIC_OR_DEPARTMENT_OTHER): Payer: Self-pay

## 2020-09-25 DIAGNOSIS — K5792 Diverticulitis of intestine, part unspecified, without perforation or abscess without bleeding: Secondary | ICD-10-CM | POA: Diagnosis not present

## 2020-09-25 DIAGNOSIS — R7989 Other specified abnormal findings of blood chemistry: Secondary | ICD-10-CM

## 2020-09-25 LAB — CBC WITH DIFFERENTIAL/PLATELET
Abs Immature Granulocytes: 0.05 10*3/uL (ref 0.00–0.07)
Basophils Absolute: 0.1 10*3/uL (ref 0.0–0.1)
Basophils Relative: 1 %
Eosinophils Absolute: 0.2 10*3/uL (ref 0.0–0.5)
Eosinophils Relative: 2 %
HCT: 32.1 % — ABNORMAL LOW (ref 36.0–46.0)
Hemoglobin: 10.2 g/dL — ABNORMAL LOW (ref 12.0–15.0)
Immature Granulocytes: 1 %
Lymphocytes Relative: 18 %
Lymphs Abs: 1.9 10*3/uL (ref 0.7–4.0)
MCH: 29.8 pg (ref 26.0–34.0)
MCHC: 31.8 g/dL (ref 30.0–36.0)
MCV: 93.9 fL (ref 80.0–100.0)
Monocytes Absolute: 1.3 10*3/uL — ABNORMAL HIGH (ref 0.1–1.0)
Monocytes Relative: 13 %
Neutro Abs: 7 10*3/uL (ref 1.7–7.7)
Neutrophils Relative %: 65 %
Platelets: 901 10*3/uL (ref 150–400)
RBC: 3.42 MIL/uL — ABNORMAL LOW (ref 3.87–5.11)
RDW: 15.2 % (ref 11.5–15.5)
WBC: 10.5 10*3/uL (ref 4.0–10.5)
nRBC: 0 % (ref 0.0–0.2)

## 2020-09-25 LAB — COMPREHENSIVE METABOLIC PANEL
ALT: 16 U/L (ref 0–44)
AST: 20 U/L (ref 15–41)
Albumin: 2.5 g/dL — ABNORMAL LOW (ref 3.5–5.0)
Alkaline Phosphatase: 111 U/L (ref 38–126)
Anion gap: 8 (ref 5–15)
BUN: 6 mg/dL — ABNORMAL LOW (ref 8–23)
CO2: 26 mmol/L (ref 22–32)
Calcium: 8.6 mg/dL — ABNORMAL LOW (ref 8.9–10.3)
Chloride: 100 mmol/L (ref 98–111)
Creatinine, Ser: 0.56 mg/dL (ref 0.44–1.00)
GFR, Estimated: >60 mL/min (ref >60)
Glucose, Bld: 109 mg/dL — ABNORMAL HIGH (ref 70–99)
Potassium: 3.6 mmol/L (ref 3.5–5.1)
Sodium: 134 mmol/L — ABNORMAL LOW (ref 135–145)
Total Bilirubin: 0.3 mg/dL (ref 0.3–1.2)
Total Protein: 6.8 g/dL (ref 6.5–8.1)

## 2020-09-25 NOTE — Telephone Encounter (Signed)
Spoke with Patients husband who is aware and agreeable to taking patient back to [facility] to have additional labs drawn Orders placed Confirmed with [facility] that add on orders have been placed in Epic and should be visible when patients checks in

## 2020-09-25 NOTE — Telephone Encounter (Signed)
Received a phone call from the lab at [facility] for a critical alert on this patients platelet cont Platelets reported at 901, lab results in epic Will forward to Dr. Tommi Rumps [place] for advisement

## 2020-09-26 ENCOUNTER — Ambulatory Visit (HOSPITAL_COMMUNITY): Payer: Medicare PPO

## 2020-09-26 ENCOUNTER — Ambulatory Visit (HOSPITAL_COMMUNITY)
Admission: RE | Admit: 2020-09-26 | Discharge: 2020-09-26 | Disposition: A | Discharge status: Home / Self Care | Payer: Medicare PPO | Source: Ambulatory Visit | Attending: Nurse Practitioner | Admitting: Nurse Practitioner

## 2020-09-26 ENCOUNTER — Other Ambulatory Visit (HOSPITAL_COMMUNITY)
Admission: RE | Admit: 2020-09-26 | Discharge: 2020-09-26 | Disposition: A | Discharge status: Home / Self Care | Payer: Medicare PPO | Source: Ambulatory Visit | Attending: Family Medicine | Admitting: Family Medicine

## 2020-09-26 ENCOUNTER — Other Ambulatory Visit: Payer: Self-pay

## 2020-09-26 DIAGNOSIS — K9400 Colostomy complication, unspecified: Secondary | ICD-10-CM | POA: Diagnosis not present

## 2020-09-26 DIAGNOSIS — Z933 Colostomy status: Secondary | ICD-10-CM | POA: Insufficient documentation

## 2020-09-26 DIAGNOSIS — K5792 Diverticulitis of intestine, part unspecified, without perforation or abscess without bleeding: Secondary | ICD-10-CM | POA: Diagnosis not present

## 2020-09-26 DIAGNOSIS — R7989 Other specified abnormal findings of blood chemistry: Secondary | ICD-10-CM | POA: Diagnosis not present

## 2020-09-26 LAB — FERRITIN: Ferritin: 517 ng/mL — ABNORMAL HIGH (ref 11–307)

## 2020-09-26 LAB — CBC WITH DIFFERENTIAL/PLATELET
Abs Immature Granulocytes: 0.05 10*3/uL (ref 0.00–0.07)
Basophils Absolute: 0.1 10*3/uL (ref 0.0–0.1)
Basophils Relative: 1 %
Eosinophils Absolute: 0.2 10*3/uL (ref 0.0–0.5)
Eosinophils Relative: 1 %
HCT: 33.3 % — ABNORMAL LOW (ref 36.0–46.0)
Hemoglobin: 10.4 g/dL — ABNORMAL LOW (ref 12.0–15.0)
Immature Granulocytes: 0 %
Lymphocytes Relative: 14 %
Lymphs Abs: 1.7 10*3/uL (ref 0.7–4.0)
MCH: 29.7 pg (ref 26.0–34.0)
MCHC: 31.2 g/dL (ref 30.0–36.0)
MCV: 95.1 fL (ref 80.0–100.0)
Monocytes Absolute: 1.3 10*3/uL — ABNORMAL HIGH (ref 0.1–1.0)
Monocytes Relative: 11 %
Neutro Abs: 8.7 10*3/uL — ABNORMAL HIGH (ref 1.7–7.7)
Neutrophils Relative %: 73 %
Platelets: 885 10*3/uL — ABNORMAL HIGH (ref 150–400)
RBC: 3.5 MIL/uL — ABNORMAL LOW (ref 3.87–5.11)
RDW: 15.2 % (ref 11.5–15.5)
WBC: 12.1 10*3/uL — ABNORMAL HIGH (ref 4.0–10.5)
nRBC: 0 % (ref 0.0–0.2)

## 2020-09-26 LAB — IRON AND TIBC
Iron: 10 ug/dL — ABNORMAL LOW (ref 28–170)
Saturation Ratios: 4 % — ABNORMAL LOW (ref 10.4–31.8)
TIBC: 231 ug/dL — ABNORMAL LOW (ref 250–450)
UIBC: 221 ug/dL

## 2020-09-26 NOTE — Discharge Instructions (Signed)
Call office for supplies

## 2020-09-26 NOTE — Progress Notes (Signed)
[facility]   Reason for visit:  LUQ end colostomy.  Spouse has been caring for ostomy.  WOuld like to see wife more engaged. Patient is willing to participate.  We discuss her starting with emptying in toilet.  She agrees to try this.   HPI:  Diverticulitis with colostomy ROS  Review of Systems  Gastrointestinal:        LUQ colostomy and midline abdominal surgical wound.   Skin:  Positive for wound.       Midline surgical wound  Vital signs:  BP 101/62 (BP Location: Right Arm)   Pulse 100   Temp 97.6 F (36.4 C) (Oral)   Resp 18   SpO2 99%  Exam:  Physical Exam  Stoma type/location:  LUQ colostomy Stomal assessment/size:  1"  Peristomal assessment:  midline abdominal surgical wound.  Crease at 9 o'clock  filled in with barrier strip Treatment options for stomal/peristomal skin: barrier ring and switched to 1 piece convex pouch with belt  Output: soft brown stool.  Was constipated, began miralax and stool now soft. Ostomy pouching: 1pc.convex with barrier ring and belt.  Sent home with samples.  Call clinic if need me to order with DME company (clarify with [facility] nurse)  Education provided:  pouch change performed. Cut barrier off center to avoid wound.  Pattern sent home with patient.  WOC Nurse wound follow up Wound type:midline abdominal incision Measurement: 18 cm x 8 cm x 3 cm (spouse states this has gotten larger and changed shape) Wound ZRA:QTMAUQ adipose, blue sutures and pink and moist Drainage (amount, consistency, odor) minimal serosanguinous  no odor.  Periwound: LUQ colostomy Dressing procedure/placement/frequency:Continue twice daily NS moist gauze.  Sent supplies home with patient       Impression/dx  LUQ flush colostomy Discussion  See above  change to 1 piece convex pouch with barrier ring Plan  Call office for supplies and ongoing appointment.     Visit time: 55 minutes.   Maple Hudson FNP-BC

## 2020-09-27 ENCOUNTER — Other Ambulatory Visit (HOSPITAL_BASED_OUTPATIENT_CLINIC_OR_DEPARTMENT_OTHER): Payer: Self-pay | Admitting: Family Medicine

## 2020-09-27 DIAGNOSIS — D509 Iron deficiency anemia, unspecified: Secondary | ICD-10-CM

## 2020-09-27 LAB — PATHOLOGIST SMEAR REVIEW

## 2020-09-27 MED ORDER — FERROUS SULFATE 325 (65 FE) MG PO TBEC: 325.0000 mg | DELAYED_RELEASE_TABLET | ORAL | 1 refills | Status: DC

## 2020-09-28 ENCOUNTER — Telehealth (HOSPITAL_BASED_OUTPATIENT_CLINIC_OR_DEPARTMENT_OTHER): Payer: Self-pay | Admitting: Family Medicine

## 2020-09-28 DIAGNOSIS — T8143XD Infection following a procedure, organ and space surgical site, subsequent encounter: Secondary | ICD-10-CM | POA: Diagnosis not present

## 2020-09-28 DIAGNOSIS — I129 Hypertensive chronic kidney disease with stage 1 through stage 4 chronic kidney disease, or unspecified chronic kidney disease: Secondary | ICD-10-CM | POA: Diagnosis not present

## 2020-09-28 DIAGNOSIS — Z433 Encounter for attention to colostomy: Secondary | ICD-10-CM | POA: Diagnosis not present

## 2020-09-28 DIAGNOSIS — N179 Acute kidney failure, unspecified: Secondary | ICD-10-CM | POA: Diagnosis not present

## 2020-09-28 DIAGNOSIS — N1831 Chronic kidney disease, stage 3a: Secondary | ICD-10-CM | POA: Diagnosis not present

## 2020-09-28 DIAGNOSIS — D631 Anemia in chronic kidney disease: Secondary | ICD-10-CM | POA: Diagnosis not present

## 2020-09-28 DIAGNOSIS — T8131XD Disruption of external operation (surgical) wound, not elsewhere classified, subsequent encounter: Secondary | ICD-10-CM | POA: Diagnosis not present

## 2020-09-28 DIAGNOSIS — I700 Atherosclerosis of aorta: Secondary | ICD-10-CM | POA: Diagnosis not present

## 2020-09-28 DIAGNOSIS — I2510 Atherosclerotic heart disease of native coronary artery without angina pectoris: Secondary | ICD-10-CM | POA: Diagnosis not present

## 2020-09-28 NOTE — Telephone Encounter (Signed)
Received ana after hours fax from the nurse stating pt recently discharged from hospital now has a 100 temp and is having back pain and abdominal pain. Fax is in Red dot tray in provides tray. Please advise.

## 2020-09-28 NOTE — Telephone Encounter (Signed)
Spoke with patients husband, ok per DPR States that patient woke up this morning with a fever of 100 and complaining of abdominal pain and lower back pain Confirmed with patients husband that pain is not at incision site, patient states "pain feels like normal gas pains" Patients husband states that her temp had returned to normal at 98.6 and the patient was able to eat breakfast and felt ok Patients husband is concerned because she has had sever infections and her white count is continually elevated Patients husband is wanting advisement, he is aware and agreeable that I will forward concerns to Dr. Tommi Rumps [place] and call him back

## 2020-09-29 DIAGNOSIS — N1831 Chronic kidney disease, stage 3a: Secondary | ICD-10-CM | POA: Diagnosis not present

## 2020-09-29 DIAGNOSIS — I2510 Atherosclerotic heart disease of native coronary artery without angina pectoris: Secondary | ICD-10-CM | POA: Diagnosis not present

## 2020-09-29 DIAGNOSIS — T8143XD Infection following a procedure, organ and space surgical site, subsequent encounter: Secondary | ICD-10-CM | POA: Diagnosis not present

## 2020-09-29 DIAGNOSIS — I129 Hypertensive chronic kidney disease with stage 1 through stage 4 chronic kidney disease, or unspecified chronic kidney disease: Secondary | ICD-10-CM | POA: Diagnosis not present

## 2020-09-29 DIAGNOSIS — D631 Anemia in chronic kidney disease: Secondary | ICD-10-CM | POA: Diagnosis not present

## 2020-09-29 DIAGNOSIS — Z433 Encounter for attention to colostomy: Secondary | ICD-10-CM | POA: Diagnosis not present

## 2020-09-29 DIAGNOSIS — T8131XD Disruption of external operation (surgical) wound, not elsewhere classified, subsequent encounter: Secondary | ICD-10-CM | POA: Diagnosis not present

## 2020-09-29 DIAGNOSIS — N179 Acute kidney failure, unspecified: Secondary | ICD-10-CM | POA: Diagnosis not present

## 2020-09-29 DIAGNOSIS — I700 Atherosclerosis of aorta: Secondary | ICD-10-CM | POA: Diagnosis not present

## 2020-10-03 DIAGNOSIS — T8143XD Infection following a procedure, organ and space surgical site, subsequent encounter: Secondary | ICD-10-CM | POA: Diagnosis not present

## 2020-10-03 DIAGNOSIS — N179 Acute kidney failure, unspecified: Secondary | ICD-10-CM | POA: Diagnosis not present

## 2020-10-03 DIAGNOSIS — I129 Hypertensive chronic kidney disease with stage 1 through stage 4 chronic kidney disease, or unspecified chronic kidney disease: Secondary | ICD-10-CM | POA: Diagnosis not present

## 2020-10-03 DIAGNOSIS — Z433 Encounter for attention to colostomy: Secondary | ICD-10-CM | POA: Diagnosis not present

## 2020-10-03 DIAGNOSIS — I700 Atherosclerosis of aorta: Secondary | ICD-10-CM | POA: Diagnosis not present

## 2020-10-03 DIAGNOSIS — D631 Anemia in chronic kidney disease: Secondary | ICD-10-CM | POA: Diagnosis not present

## 2020-10-03 DIAGNOSIS — I2510 Atherosclerotic heart disease of native coronary artery without angina pectoris: Secondary | ICD-10-CM | POA: Diagnosis not present

## 2020-10-03 DIAGNOSIS — N1831 Chronic kidney disease, stage 3a: Secondary | ICD-10-CM | POA: Diagnosis not present

## 2020-10-03 DIAGNOSIS — T8131XD Disruption of external operation (surgical) wound, not elsewhere classified, subsequent encounter: Secondary | ICD-10-CM | POA: Diagnosis not present

## 2020-10-03 LAB — MISCELLANEOUS TEST

## 2020-10-05 DIAGNOSIS — I129 Hypertensive chronic kidney disease with stage 1 through stage 4 chronic kidney disease, or unspecified chronic kidney disease: Secondary | ICD-10-CM | POA: Diagnosis not present

## 2020-10-05 DIAGNOSIS — N1831 Chronic kidney disease, stage 3a: Secondary | ICD-10-CM | POA: Diagnosis not present

## 2020-10-05 DIAGNOSIS — T8131XD Disruption of external operation (surgical) wound, not elsewhere classified, subsequent encounter: Secondary | ICD-10-CM | POA: Diagnosis not present

## 2020-10-05 DIAGNOSIS — I2510 Atherosclerotic heart disease of native coronary artery without angina pectoris: Secondary | ICD-10-CM | POA: Diagnosis not present

## 2020-10-05 DIAGNOSIS — N179 Acute kidney failure, unspecified: Secondary | ICD-10-CM | POA: Diagnosis not present

## 2020-10-05 DIAGNOSIS — D631 Anemia in chronic kidney disease: Secondary | ICD-10-CM | POA: Diagnosis not present

## 2020-10-05 DIAGNOSIS — I700 Atherosclerosis of aorta: Secondary | ICD-10-CM | POA: Diagnosis not present

## 2020-10-05 DIAGNOSIS — Z433 Encounter for attention to colostomy: Secondary | ICD-10-CM | POA: Diagnosis not present

## 2020-10-05 DIAGNOSIS — T8143XD Infection following a procedure, organ and space surgical site, subsequent encounter: Secondary | ICD-10-CM | POA: Diagnosis not present

## 2020-10-09 ENCOUNTER — Other Ambulatory Visit (HOSPITAL_BASED_OUTPATIENT_CLINIC_OR_DEPARTMENT_OTHER): Payer: Self-pay

## 2020-10-09 DIAGNOSIS — I129 Hypertensive chronic kidney disease with stage 1 through stage 4 chronic kidney disease, or unspecified chronic kidney disease: Secondary | ICD-10-CM | POA: Diagnosis not present

## 2020-10-09 DIAGNOSIS — N1831 Chronic kidney disease, stage 3a: Secondary | ICD-10-CM | POA: Diagnosis not present

## 2020-10-09 DIAGNOSIS — I700 Atherosclerosis of aorta: Secondary | ICD-10-CM | POA: Diagnosis not present

## 2020-10-09 DIAGNOSIS — Z433 Encounter for attention to colostomy: Secondary | ICD-10-CM | POA: Diagnosis not present

## 2020-10-09 DIAGNOSIS — I2510 Atherosclerotic heart disease of native coronary artery without angina pectoris: Secondary | ICD-10-CM | POA: Diagnosis not present

## 2020-10-09 DIAGNOSIS — T8143XD Infection following a procedure, organ and space surgical site, subsequent encounter: Secondary | ICD-10-CM | POA: Diagnosis not present

## 2020-10-09 DIAGNOSIS — D631 Anemia in chronic kidney disease: Secondary | ICD-10-CM | POA: Diagnosis not present

## 2020-10-09 DIAGNOSIS — I10 Essential (primary) hypertension: Secondary | ICD-10-CM

## 2020-10-09 DIAGNOSIS — N179 Acute kidney failure, unspecified: Secondary | ICD-10-CM | POA: Diagnosis not present

## 2020-10-09 DIAGNOSIS — T8131XD Disruption of external operation (surgical) wound, not elsewhere classified, subsequent encounter: Secondary | ICD-10-CM | POA: Diagnosis not present

## 2020-10-09 LAB — URINE CULTURE: Culture: 10000 — AB

## 2020-10-09 MED ORDER — NEBIVOLOL HCL 20 MG PO TABS: 20.0000 mg | ORAL_TABLET | Freq: Every day | ORAL | 0 refills | Status: DC

## 2020-10-09 MED ORDER — AMLODIPINE BESYLATE 10 MG PO TABS: 10.0000 mg | ORAL_TABLET | Freq: Every day | ORAL | 1 refills | Status: DC

## 2020-10-10 DIAGNOSIS — D631 Anemia in chronic kidney disease: Secondary | ICD-10-CM | POA: Diagnosis not present

## 2020-10-10 DIAGNOSIS — T8143XD Infection following a procedure, organ and space surgical site, subsequent encounter: Secondary | ICD-10-CM | POA: Diagnosis not present

## 2020-10-10 DIAGNOSIS — I700 Atherosclerosis of aorta: Secondary | ICD-10-CM | POA: Diagnosis not present

## 2020-10-10 DIAGNOSIS — N1831 Chronic kidney disease, stage 3a: Secondary | ICD-10-CM | POA: Diagnosis not present

## 2020-10-10 DIAGNOSIS — Z433 Encounter for attention to colostomy: Secondary | ICD-10-CM | POA: Diagnosis not present

## 2020-10-10 DIAGNOSIS — T8131XD Disruption of external operation (surgical) wound, not elsewhere classified, subsequent encounter: Secondary | ICD-10-CM | POA: Diagnosis not present

## 2020-10-10 DIAGNOSIS — N179 Acute kidney failure, unspecified: Secondary | ICD-10-CM | POA: Diagnosis not present

## 2020-10-10 DIAGNOSIS — I129 Hypertensive chronic kidney disease with stage 1 through stage 4 chronic kidney disease, or unspecified chronic kidney disease: Secondary | ICD-10-CM | POA: Diagnosis not present

## 2020-10-10 DIAGNOSIS — I2510 Atherosclerotic heart disease of native coronary artery without angina pectoris: Secondary | ICD-10-CM | POA: Diagnosis not present

## 2020-10-13 DIAGNOSIS — N179 Acute kidney failure, unspecified: Secondary | ICD-10-CM | POA: Diagnosis not present

## 2020-10-13 DIAGNOSIS — T8143XD Infection following a procedure, organ and space surgical site, subsequent encounter: Secondary | ICD-10-CM | POA: Diagnosis not present

## 2020-10-13 DIAGNOSIS — I129 Hypertensive chronic kidney disease with stage 1 through stage 4 chronic kidney disease, or unspecified chronic kidney disease: Secondary | ICD-10-CM | POA: Diagnosis not present

## 2020-10-13 DIAGNOSIS — I700 Atherosclerosis of aorta: Secondary | ICD-10-CM | POA: Diagnosis not present

## 2020-10-13 DIAGNOSIS — D631 Anemia in chronic kidney disease: Secondary | ICD-10-CM | POA: Diagnosis not present

## 2020-10-13 DIAGNOSIS — Z433 Encounter for attention to colostomy: Secondary | ICD-10-CM | POA: Diagnosis not present

## 2020-10-13 DIAGNOSIS — I2510 Atherosclerotic heart disease of native coronary artery without angina pectoris: Secondary | ICD-10-CM | POA: Diagnosis not present

## 2020-10-13 DIAGNOSIS — N1831 Chronic kidney disease, stage 3a: Secondary | ICD-10-CM | POA: Diagnosis not present

## 2020-10-13 DIAGNOSIS — T8131XD Disruption of external operation (surgical) wound, not elsewhere classified, subsequent encounter: Secondary | ICD-10-CM | POA: Diagnosis not present

## 2020-10-16 DIAGNOSIS — I2510 Atherosclerotic heart disease of native coronary artery without angina pectoris: Secondary | ICD-10-CM | POA: Diagnosis not present

## 2020-10-16 DIAGNOSIS — T8143XD Infection following a procedure, organ and space surgical site, subsequent encounter: Secondary | ICD-10-CM | POA: Diagnosis not present

## 2020-10-16 DIAGNOSIS — D631 Anemia in chronic kidney disease: Secondary | ICD-10-CM | POA: Diagnosis not present

## 2020-10-16 DIAGNOSIS — Z433 Encounter for attention to colostomy: Secondary | ICD-10-CM | POA: Diagnosis not present

## 2020-10-16 DIAGNOSIS — I700 Atherosclerosis of aorta: Secondary | ICD-10-CM | POA: Diagnosis not present

## 2020-10-16 DIAGNOSIS — N1831 Chronic kidney disease, stage 3a: Secondary | ICD-10-CM | POA: Diagnosis not present

## 2020-10-16 DIAGNOSIS — N179 Acute kidney failure, unspecified: Secondary | ICD-10-CM | POA: Diagnosis not present

## 2020-10-16 DIAGNOSIS — T8131XD Disruption of external operation (surgical) wound, not elsewhere classified, subsequent encounter: Secondary | ICD-10-CM | POA: Diagnosis not present

## 2020-10-16 DIAGNOSIS — I129 Hypertensive chronic kidney disease with stage 1 through stage 4 chronic kidney disease, or unspecified chronic kidney disease: Secondary | ICD-10-CM | POA: Diagnosis not present

## 2020-10-18 ENCOUNTER — Ambulatory Visit (HOSPITAL_BASED_OUTPATIENT_CLINIC_OR_DEPARTMENT_OTHER): Payer: Medicare PPO | Admitting: Family Medicine

## 2020-10-18 DIAGNOSIS — N1831 Chronic kidney disease, stage 3a: Secondary | ICD-10-CM | POA: Diagnosis not present

## 2020-10-18 DIAGNOSIS — I2510 Atherosclerotic heart disease of native coronary artery without angina pectoris: Secondary | ICD-10-CM | POA: Diagnosis not present

## 2020-10-18 DIAGNOSIS — N179 Acute kidney failure, unspecified: Secondary | ICD-10-CM | POA: Diagnosis not present

## 2020-10-18 DIAGNOSIS — I129 Hypertensive chronic kidney disease with stage 1 through stage 4 chronic kidney disease, or unspecified chronic kidney disease: Secondary | ICD-10-CM | POA: Diagnosis not present

## 2020-10-18 DIAGNOSIS — Z433 Encounter for attention to colostomy: Secondary | ICD-10-CM | POA: Diagnosis not present

## 2020-10-18 DIAGNOSIS — D631 Anemia in chronic kidney disease: Secondary | ICD-10-CM | POA: Diagnosis not present

## 2020-10-18 DIAGNOSIS — I700 Atherosclerosis of aorta: Secondary | ICD-10-CM | POA: Diagnosis not present

## 2020-10-18 DIAGNOSIS — T8131XD Disruption of external operation (surgical) wound, not elsewhere classified, subsequent encounter: Secondary | ICD-10-CM | POA: Diagnosis not present

## 2020-10-18 DIAGNOSIS — T8143XD Infection following a procedure, organ and space surgical site, subsequent encounter: Secondary | ICD-10-CM | POA: Diagnosis not present

## 2020-10-19 ENCOUNTER — Telehealth (HOSPITAL_BASED_OUTPATIENT_CLINIC_OR_DEPARTMENT_OTHER): Payer: Self-pay

## 2020-10-19 ENCOUNTER — Other Ambulatory Visit: Payer: Self-pay

## 2020-10-19 ENCOUNTER — Telehealth (INDEPENDENT_AMBULATORY_CARE_PROVIDER_SITE_OTHER): Payer: Medicare PPO | Admitting: Family Medicine

## 2020-10-19 DIAGNOSIS — I10 Essential (primary) hypertension: Secondary | ICD-10-CM | POA: Diagnosis not present

## 2020-10-19 DIAGNOSIS — Z933 Colostomy status: Secondary | ICD-10-CM | POA: Diagnosis not present

## 2020-10-19 DIAGNOSIS — D75839 Thrombocytosis, unspecified: Secondary | ICD-10-CM

## 2020-10-19 DIAGNOSIS — D509 Iron deficiency anemia, unspecified: Secondary | ICD-10-CM

## 2020-10-19 NOTE — Progress Notes (Signed)
   Virtual Visit via [place]   I connected with  Junius Finner  on 10/20/20 by Caregility and verified that I am speaking with the correct person using two identifiers.   I discussed the limitations, risks, security and privacy concerns of performing an evaluation and management service by telephone, including the higher likelihood of inaccurate diagnosis and treatment, and the availability of in person appointments.  We also discussed the likely need of an additional face to face encounter for complete and high quality delivery of care.  I also discussed with the patient that there may be a patient responsible charge related to this service. The patient expressed understanding and wishes to proceed.  Provider location is in medical facility. Patient location is at their home, different from provider location. People involved in care of the patient during this telehealth encounter were myself, my nurse/medical assistant, and my front office/scheduling team member.  Review of Systems: No fevers, chills, night sweats, weight loss, chest pain, or shortness of breath.   Objective Findings:    General: Speaking full sentences, no audible heavy breathing.  Sounds alert and appropriately interactive.    Independent interpretation of tests performed by another provider:   None.  Brief History, Exam, Impression, and Recommendations:    Essential hypertension, benign Has home health coming to the house and they have been checking the blood pressure which she reports has been about 120/60.  Denies any issues with lightheadedness, dizziness, no chest pain or headaches. Continues with nebivolol and amlodipine. Given good control blood pressure and no reported issues with low blood pressure readings, will continue with current medication regimen  Thrombocytosis Noted on prior labs, patient without any concerning symptoms or clinical findings at this time; suspect that elevated platelets is related to  her recent illnesses, hospitalization and con commitment anemia Recommend rechecking CBC within the next week to reassess status She plans to have labs completed at [facility]  Anemia Anemia present on prior labs, was stable, has been utilizing oral iron supplementation, some difficulty with this related to GI issues, constipation especially with current colostomy Plan to recheck CBC to assess current status Encouraged to continue with oral iron supplementation as tolerated, even if only once or twice weekly If anemia does not appear to be responding to oral iron supplementation, may need to consider evaluation with hematology for consideration of IV iron infusions  Colostomy in place [facility]) Generally seems to be doing well with this.  Occasionally with some leakage.  Some GI in constipation issues related to oral iron supplementation Does have home health coming to the house still Has met with surgeon and has upcoming appointment with GI for colonoscopy in November.  Decision upon reversal of colostomy is pending results of colonoscopy  I discussed the above assessment and treatment plan with the patient. The patient was provided an opportunity to ask questions and all were answered. The patient agreed with the plan and demonstrated an understanding of the instructions.   The patient was advised to call back or seek an in-person evaluation if the symptoms worsen or if the condition fails to improve as anticipated.   I provided 30 minutes of face to face and non-face-to-face time during this encounter date, time was needed to gather information, review chart, records, communicate/coordinate with staff remotely, as well as complete documentation.   ___________________________________________ Trinda Harlacher de [place], MD, ABFM, CAQSM Primary Care and [place]

## 2020-10-19 NOTE — Telephone Encounter (Signed)
Called patient to get her checked in for her MyChart visit.  Left voicemail for her to call back.

## 2020-10-19 NOTE — Telephone Encounter (Signed)
Called patient to get her checked in for her MYChart video visit appointment.  Left voicemail to call back.

## 2020-10-20 NOTE — Assessment & Plan Note (Signed)
Noted on prior labs, patient without any concerning symptoms or clinical findings at this time; suspect that elevated platelets is related to her recent illnesses, hospitalization and con commitment anemia Recommend rechecking CBC within the next week to reassess status She plans to have labs completed at [facility]

## 2020-10-20 NOTE — Assessment & Plan Note (Signed)
Generally seems to be doing well with this.  Occasionally with some leakage.  Some GI in constipation issues related to oral iron supplementation Does have home health coming to the house still Has met with surgeon and has upcoming appointment with GI for colonoscopy in November.  Decision upon reversal of colostomy is pending results of colonoscopy

## 2020-10-20 NOTE — Assessment & Plan Note (Signed)
Anemia present on prior labs, was stable, has been utilizing oral iron supplementation, some difficulty with this related to GI issues, constipation especially with current colostomy Plan to recheck CBC to assess current status Encouraged to continue with oral iron supplementation as tolerated, even if only once or twice weekly If anemia does not appear to be responding to oral iron supplementation, may need to consider evaluation with hematology for consideration of IV iron infusions

## 2020-10-20 NOTE — Assessment & Plan Note (Signed)
Has home health coming to the house and they have been checking the blood pressure which she reports has been about 120/60.  Denies any issues with lightheadedness, dizziness, no chest pain or headaches. Continues with nebivolol and amlodipine. Given good control blood pressure and no reported issues with low blood pressure readings, will continue with current medication regimen

## 2020-10-23 ENCOUNTER — Other Ambulatory Visit (HOSPITAL_COMMUNITY)
Admission: RE | Admit: 2020-10-23 | Discharge: 2020-10-23 | Disposition: A | Discharge status: Home / Self Care | Payer: Medicare PPO | Source: Ambulatory Visit | Attending: Nurse Practitioner | Admitting: Nurse Practitioner

## 2020-10-23 DIAGNOSIS — D75839 Thrombocytosis, unspecified: Secondary | ICD-10-CM

## 2020-10-23 DIAGNOSIS — D509 Iron deficiency anemia, unspecified: Secondary | ICD-10-CM | POA: Insufficient documentation

## 2020-10-23 LAB — CBC WITH DIFFERENTIAL/PLATELET
Abs Immature Granulocytes: 0.05 10*3/uL (ref 0.00–0.07)
Basophils Absolute: 0.1 10*3/uL (ref 0.0–0.1)
Basophils Relative: 1 %
Eosinophils Absolute: 0.2 10*3/uL (ref 0.0–0.5)
Eosinophils Relative: 1 %
HCT: 36.4 % (ref 36.0–46.0)
Hemoglobin: 11.4 g/dL — ABNORMAL LOW (ref 12.0–15.0)
Immature Granulocytes: 0 %
Lymphocytes Relative: 18 %
Lymphs Abs: 2.1 10*3/uL (ref 0.7–4.0)
MCH: 28.9 pg (ref 26.0–34.0)
MCHC: 31.3 g/dL (ref 30.0–36.0)
MCV: 92.2 fL (ref 80.0–100.0)
Monocytes Absolute: 1.3 10*3/uL — ABNORMAL HIGH (ref 0.1–1.0)
Monocytes Relative: 11 %
Neutro Abs: 8.1 10*3/uL — ABNORMAL HIGH (ref 1.7–7.7)
Neutrophils Relative %: 69 %
Platelets: 523 10*3/uL — ABNORMAL HIGH (ref 150–400)
RBC: 3.95 MIL/uL (ref 3.87–5.11)
RDW: 16.2 % — ABNORMAL HIGH (ref 11.5–15.5)
WBC: 11.8 10*3/uL — ABNORMAL HIGH (ref 4.0–10.5)
nRBC: 0 % (ref 0.0–0.2)

## 2020-10-25 DIAGNOSIS — T8143XD Infection following a procedure, organ and space surgical site, subsequent encounter: Secondary | ICD-10-CM | POA: Diagnosis not present

## 2020-10-25 DIAGNOSIS — I129 Hypertensive chronic kidney disease with stage 1 through stage 4 chronic kidney disease, or unspecified chronic kidney disease: Secondary | ICD-10-CM | POA: Diagnosis not present

## 2020-10-25 DIAGNOSIS — I2510 Atherosclerotic heart disease of native coronary artery without angina pectoris: Secondary | ICD-10-CM | POA: Diagnosis not present

## 2020-10-25 DIAGNOSIS — T8131XD Disruption of external operation (surgical) wound, not elsewhere classified, subsequent encounter: Secondary | ICD-10-CM | POA: Diagnosis not present

## 2020-10-25 DIAGNOSIS — D631 Anemia in chronic kidney disease: Secondary | ICD-10-CM | POA: Diagnosis not present

## 2020-10-25 DIAGNOSIS — N1831 Chronic kidney disease, stage 3a: Secondary | ICD-10-CM | POA: Diagnosis not present

## 2020-10-25 DIAGNOSIS — I700 Atherosclerosis of aorta: Secondary | ICD-10-CM | POA: Diagnosis not present

## 2020-10-25 DIAGNOSIS — N179 Acute kidney failure, unspecified: Secondary | ICD-10-CM | POA: Diagnosis not present

## 2020-10-25 DIAGNOSIS — Z433 Encounter for attention to colostomy: Secondary | ICD-10-CM | POA: Diagnosis not present

## 2020-10-30 ENCOUNTER — Other Ambulatory Visit: Payer: Self-pay

## 2020-10-30 ENCOUNTER — Encounter: Payer: Self-pay | Admitting: Internal Medicine

## 2020-10-30 ENCOUNTER — Ambulatory Visit: Payer: Medicare PPO | Admitting: Internal Medicine

## 2020-10-30 VITALS — BP 115/70 | HR 88 | Temp 97.7°F | Ht 65.0 in | Wt 119.1 lb

## 2020-10-30 DIAGNOSIS — E782 Mixed hyperlipidemia: Secondary | ICD-10-CM

## 2020-10-30 DIAGNOSIS — I10 Essential (primary) hypertension: Secondary | ICD-10-CM | POA: Diagnosis not present

## 2020-10-30 DIAGNOSIS — Z933 Colostomy status: Secondary | ICD-10-CM

## 2020-10-30 DIAGNOSIS — D509 Iron deficiency anemia, unspecified: Secondary | ICD-10-CM | POA: Diagnosis not present

## 2020-10-30 DIAGNOSIS — D75839 Thrombocytosis, unspecified: Secondary | ICD-10-CM

## 2020-10-30 DIAGNOSIS — Z7689 Persons encountering health services in other specified circumstances: Secondary | ICD-10-CM | POA: Diagnosis not present

## 2020-10-30 DIAGNOSIS — R5381 Other malaise: Secondary | ICD-10-CM | POA: Diagnosis not present

## 2020-10-30 DIAGNOSIS — Z23 Encounter for immunization: Secondary | ICD-10-CM

## 2020-10-30 NOTE — Assessment & Plan Note (Signed)
Had physical therapy after hospitalization, now better Able to do her routine activities slowly Has visiting home nurse for wound care

## 2020-10-30 NOTE — Assessment & Plan Note (Signed)
He has acute phase reactant, improving Check CBC

## 2020-10-30 NOTE — Assessment & Plan Note (Signed)
On iron supplements, improving Check CBC

## 2020-10-30 NOTE — Assessment & Plan Note (Signed)
Care established Previous chart reviewed History and medications reviewed with the patient 

## 2020-10-30 NOTE — Assessment & Plan Note (Signed)
After complication from acute diverticulitis and colon resection Has follow up appointment with GI for colonoscopy, followed by plan for reversal of colostomy

## 2020-10-30 NOTE — Progress Notes (Signed)
New Patient Office Visit  Subjective:  Patient ID: Anne Aguirre, female    DOB: 1950/05/04  Age: 70 y.o. MRN: [id number]  CC:  Chief Complaint  Patient presents with   New Patient (Initial Visit)    New pt. Previous PCP Dr. Anastasio Champion. Last seen 4-5 months ago. Was hospitalized in August for 4 weeks for Diverticulitis and COVID.     HPI Anne Aguirre is a 70 year old female with PMH of HTN, diverticulitis s/p colon resection and colostomy in place and anemia who presents for establishing care.  She had 2 hospital admissions in last few months, first for acute diverticulitis and abscess requiring colon resection and colostomy placement. She had COVID infection in the first admission as well. She had second admission for abscess near liver and paracolic gutter after surgery, and was treated with IV abx. She has been doing well now. She has completed home PT for physical deconditioning after prolonged hospitalization. She has been gaining her strength back and trying to do her routine activities.  She had anemia and thrombocytosis with leukocytosis, which has been improving now. She has been taking iron supplements twice in a week, but it is causing constipation. She denies any nausea or vomiting currently.  BP is well-controlled. Takes medications regularly. Patient denies headache, dizziness, chest pain, dyspnea or palpitations.  She has had 3 doses of COVID vaccine. She had flu vaccine in the office today.  Past Medical History:  Diagnosis Date   Acute diverticulitis 08/23/2020   COVID-19 virus infection ---- +ve on 09/01/2020 09/10/2020   Essential hypertension, benign 10/01/2018   HLD (hyperlipidemia) 10/01/2018   Osteoarthritis 10/01/2018   Post-op intraabdominal/Liver abscess-Had Hartmann's with Colostomy on 08/29/2020 for Sigmoid Diverticulits 09/10/2020   s/p ex lap and Hartman's procedure with colostomy by [facility] 08/29/20 for diverticulitis with abscess-- Now with Liver/intrabdominal abscess---    Sepsis Due to Intraabdomonal Abscess 09/10/2020   Vitamin D deficiency disease 10/01/2018    Past Surgical History:  Procedure Laterality Date   ABDOMINAL SURGERY     COLON RESECTION N/A 08/29/2020   Procedure: COLON RESECTION;  Surgeon: Ralene Ok, MD;  Location: [facility];  Service: General;  Laterality: N/A;   COLON SURGERY     COLOSTOMY N/A 08/29/2020   Procedure: COLOSTOMY;  Surgeon: Ralene Ok, MD;  Location: [facility];  Service: General;  Laterality: N/A;   LAPAROTOMY N/A 08/29/2020   Procedure: EXPLORATORY LAPAROTOMY;  Surgeon: Ralene Ok, MD;  Location: [facility] OR;  Service: General;  Laterality: N/A;   TONSILLECTOMY Bilateral     Family History  Problem Relation Age of Onset   Hypertension Mother    Stroke Mother    Stroke Father    Hypertension Brother    Hypertension Son     Social History   Socioeconomic History   Marital status: Married    Spouse name: Not on file   Number of children: Not on file   Years of education: Not on file   Highest education level: Not on file  Occupational History   Not on file  Tobacco Use   Smoking status: Never   Smokeless tobacco: Never  Vaping Use   Vaping Use: Never used  Substance and Sexual Activity   Alcohol use: Yes    Alcohol/week: 7.0 standard drinks    Types: 7 Glasses of wine per week    Comment: Red wine   Drug use: Never   Sexual activity: Not on file  Other Topics Concern   Not on file  Social History Narrative   Married for 12 years.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   Social Determinants of Health   Financial Resource Strain: Not on file  Food Insecurity: Not on file  Transportation Needs: Not on file  Physical Activity: Not on file  Stress: Not on file  Social Connections: Not on file  Intimate Partner Violence: Not on file    ROS Review of Systems  Constitutional:  Negative for chills and fever.  HENT:  Negative for congestion, sinus pressure, sinus pain and sore throat.   Eyes:   Negative for pain and discharge.  Respiratory:  Negative for cough and shortness of breath.   Cardiovascular:  Negative for chest pain and palpitations.  Gastrointestinal:  Negative for diarrhea, nausea and vomiting.  Endocrine: Negative for polydipsia and polyuria.  Genitourinary:  Negative for dysuria and hematuria.  Musculoskeletal:  Negative for neck pain and neck stiffness.  Skin:  Negative for rash.  Neurological:  Negative for dizziness and weakness.  Psychiatric/Behavioral:  Negative for agitation and behavioral problems.    Objective:   Today's Vitals: BP 115/70 (BP Location: Left Arm, Patient Position: Sitting, Cuff Size: Normal)   Pulse 88   Temp 97.7 F (36.5 C) (Oral)   Ht _0  (1.651 m)   Wt 119 lb 1.3 oz (54 kg)   SpO2 97%   BMI 19.82 kg/m   Physical Exam Vitals reviewed.  Constitutional:      General: She is not in acute distress.    Appearance: She is not diaphoretic.  HENT:     Head: Normocephalic and atraumatic.     Nose: Nose normal.     Mouth/Throat:     Mouth: Mucous membranes are moist.  Eyes:     General: No scleral icterus.    Extraocular Movements: Extraocular movements intact.  Cardiovascular:     Rate and Rhythm: Normal rate and regular rhythm.     Pulses: Normal pulses.     Heart sounds: Normal heart sounds. No murmur heard. Pulmonary:     Breath sounds: Normal breath sounds. No wheezing or rales.  Abdominal:     Palpations: Abdomen is soft.     Tenderness: There is no abdominal tenderness.     Comments: Colostomy in place  Musculoskeletal:     Cervical back: Neck supple. No tenderness.     Right lower leg: No edema.     Left lower leg: No edema.  Skin:    General: Skin is warm.     Findings: No rash.  Neurological:     General: No focal deficit present.     Mental Status: She is alert and oriented to person, place, and time.     Sensory: No sensory deficit.     Motor: No weakness.  Psychiatric:        Mood and Affect: Mood  normal.        Behavior: Behavior normal.    Assessment & Plan:   Problem List Items Addressed This Visit       Encounter to establish care - Primary   Care established Previous chart reviewed History and medications reviewed with the patient       Cardiovascular and Mediastinum   Essential hypertension, benign    BP Readings from Last 1 Encounters:  10/30/20 115/70  Well-controlled Counseled for compliance with the medications Advised DASH diet and moderate exercise/walking, at least 150 mins/week       Relevant Orders   CBC with Differential/Platelet   CMP14+EGFR  Hematopoietic and Hemostatic   Thrombocytosis    He has acute phase reactant, improving Check CBC      Relevant Orders   CBC with Differential/Platelet     Other   HLD (hyperlipidemia)    Last lipid profile reviewed Advised to follow DASH diet for now      Relevant Orders   Lipid Profile   Colostomy in place [facility])    After complication from acute diverticulitis and colon resection Has follow up appointment with GI for colonoscopy, followed by plan for reversal of colostomy      Anemia    On iron supplements, improving Check CBC      Physical deconditioning    Had physical therapy after hospitalization, now better Able to do her routine activities slowly Has visiting home nurse for wound care      Status post Hartmann procedure [facility])    For complication from acute diverticulitis Has colostomy in place, followed by general surgery      Relevant Orders   CBC with Differential/Platelet         Other Visit Diagnoses     Need for immunization against influenza       Relevant Orders   Flu Vaccine QUAD High Dose(Fluad) (Completed)       Outpatient Encounter Medications as of 10/30/2020  Medication Sig   amLODipine (NORVASC) 10 MG tablet Take 1 tablet (10 mg total) by mouth daily.   ferrous sulfate 325 (65 FE) MG EC tablet Take 1 tablet (325 mg total) by mouth every Monday,  Wednesday, and Friday. Take separate from food if able. Avoid taking with calcium-containing foods and beverages.   Nebivolol HCl 20 MG TABS Take 1 tablet (20 mg total) by mouth daily at 12 noon.   [DISCONTINUED] oxyCODONE (OXY IR/ROXICODONE) 5 MG immediate release tablet Take 1-2 tablets (5-10 mg total) by mouth every 6 (six) hours as needed for breakthrough pain.   No facility-administered encounter medications on file as of 10/30/2020.    Follow-up: Return in about 3 months (around 01/30/2021) for HTN and anemia.   [name], MD

## 2020-10-30 NOTE — Patient Instructions (Signed)
Please continue taking medications as prescribed.  Continue to follow low salt diet and ambulate as tolerated.  Please get fasting blood tests done before the next visit.

## 2020-10-30 NOTE — Assessment & Plan Note (Signed)
For complication from acute diverticulitis Has colostomy in place, followed by general surgery

## 2020-10-30 NOTE — Assessment & Plan Note (Signed)
BP Readings from Last 1 Encounters:  10/30/20 115/70   Well-controlled Counseled for compliance with the medications Advised DASH diet and moderate exercise/walking, at least 150 mins/week

## 2020-10-30 NOTE — Assessment & Plan Note (Signed)
Last lipid profile reviewed Advised to follow DASH diet for now 

## 2020-11-01 DIAGNOSIS — I700 Atherosclerosis of aorta: Secondary | ICD-10-CM | POA: Diagnosis not present

## 2020-11-01 DIAGNOSIS — N179 Acute kidney failure, unspecified: Secondary | ICD-10-CM | POA: Diagnosis not present

## 2020-11-01 DIAGNOSIS — I2510 Atherosclerotic heart disease of native coronary artery without angina pectoris: Secondary | ICD-10-CM | POA: Diagnosis not present

## 2020-11-01 DIAGNOSIS — D631 Anemia in chronic kidney disease: Secondary | ICD-10-CM | POA: Diagnosis not present

## 2020-11-01 DIAGNOSIS — Z433 Encounter for attention to colostomy: Secondary | ICD-10-CM | POA: Diagnosis not present

## 2020-11-01 DIAGNOSIS — T8143XD Infection following a procedure, organ and space surgical site, subsequent encounter: Secondary | ICD-10-CM | POA: Diagnosis not present

## 2020-11-01 DIAGNOSIS — I129 Hypertensive chronic kidney disease with stage 1 through stage 4 chronic kidney disease, or unspecified chronic kidney disease: Secondary | ICD-10-CM | POA: Diagnosis not present

## 2020-11-01 DIAGNOSIS — T8131XD Disruption of external operation (surgical) wound, not elsewhere classified, subsequent encounter: Secondary | ICD-10-CM | POA: Diagnosis not present

## 2020-11-01 DIAGNOSIS — N1831 Chronic kidney disease, stage 3a: Secondary | ICD-10-CM | POA: Diagnosis not present

## 2020-11-09 DIAGNOSIS — Z433 Encounter for attention to colostomy: Secondary | ICD-10-CM | POA: Diagnosis not present

## 2020-11-09 DIAGNOSIS — T8131XD Disruption of external operation (surgical) wound, not elsewhere classified, subsequent encounter: Secondary | ICD-10-CM | POA: Diagnosis not present

## 2020-11-09 DIAGNOSIS — T8143XD Infection following a procedure, organ and space surgical site, subsequent encounter: Secondary | ICD-10-CM | POA: Diagnosis not present

## 2020-11-09 DIAGNOSIS — I700 Atherosclerosis of aorta: Secondary | ICD-10-CM | POA: Diagnosis not present

## 2020-11-09 DIAGNOSIS — N1831 Chronic kidney disease, stage 3a: Secondary | ICD-10-CM | POA: Diagnosis not present

## 2020-11-09 DIAGNOSIS — D631 Anemia in chronic kidney disease: Secondary | ICD-10-CM | POA: Diagnosis not present

## 2020-11-09 DIAGNOSIS — N179 Acute kidney failure, unspecified: Secondary | ICD-10-CM | POA: Diagnosis not present

## 2020-11-09 DIAGNOSIS — I129 Hypertensive chronic kidney disease with stage 1 through stage 4 chronic kidney disease, or unspecified chronic kidney disease: Secondary | ICD-10-CM | POA: Diagnosis not present

## 2020-11-09 DIAGNOSIS — I2510 Atherosclerotic heart disease of native coronary artery without angina pectoris: Secondary | ICD-10-CM | POA: Diagnosis not present

## 2020-11-14 DIAGNOSIS — I2510 Atherosclerotic heart disease of native coronary artery without angina pectoris: Secondary | ICD-10-CM | POA: Diagnosis not present

## 2020-11-14 DIAGNOSIS — N1831 Chronic kidney disease, stage 3a: Secondary | ICD-10-CM | POA: Diagnosis not present

## 2020-11-14 DIAGNOSIS — D631 Anemia in chronic kidney disease: Secondary | ICD-10-CM | POA: Diagnosis not present

## 2020-11-14 DIAGNOSIS — I700 Atherosclerosis of aorta: Secondary | ICD-10-CM | POA: Diagnosis not present

## 2020-11-14 DIAGNOSIS — I129 Hypertensive chronic kidney disease with stage 1 through stage 4 chronic kidney disease, or unspecified chronic kidney disease: Secondary | ICD-10-CM | POA: Diagnosis not present

## 2020-11-14 DIAGNOSIS — T8143XD Infection following a procedure, organ and space surgical site, subsequent encounter: Secondary | ICD-10-CM | POA: Diagnosis not present

## 2020-11-14 DIAGNOSIS — T8131XD Disruption of external operation (surgical) wound, not elsewhere classified, subsequent encounter: Secondary | ICD-10-CM | POA: Diagnosis not present

## 2020-11-14 DIAGNOSIS — Z433 Encounter for attention to colostomy: Secondary | ICD-10-CM | POA: Diagnosis not present

## 2020-11-14 DIAGNOSIS — N179 Acute kidney failure, unspecified: Secondary | ICD-10-CM | POA: Diagnosis not present

## 2020-11-17 ENCOUNTER — Ambulatory Visit (HOSPITAL_COMMUNITY)
Admission: RE | Admit: 2020-11-17 | Discharge: 2020-11-17 | Disposition: A | Discharge status: Home / Self Care | Payer: Medicare PPO | Source: Ambulatory Visit | Attending: Nurse Practitioner | Admitting: Nurse Practitioner

## 2020-11-17 DIAGNOSIS — L24B1 Irritant contact dermatitis related to digestive stoma or fistula: Secondary | ICD-10-CM | POA: Diagnosis not present

## 2020-11-17 DIAGNOSIS — Z933 Colostomy status: Secondary | ICD-10-CM | POA: Diagnosis not present

## 2020-11-17 DIAGNOSIS — K5732 Diverticulitis of large intestine without perforation or abscess without bleeding: Secondary | ICD-10-CM | POA: Diagnosis not present

## 2020-11-17 DIAGNOSIS — K9400 Colostomy complication, unspecified: Secondary | ICD-10-CM | POA: Diagnosis not present

## 2020-11-17 NOTE — Progress Notes (Signed)
 Ostomy Clinic   Reason for visit:  LLQ colostomy, flush, located in abdominal crease with midline surgical wound. Complex pouching situation.  HH has now discharged and patient and spouse are changing pouch daily.   HPI:  Sigmoid diverticulitis with LLQ colostomy ROS  Review of Systems  Gastrointestinal:        LLQ colostomy Midline abdominal surgical wound, resolving.   Skin:  Positive for rash and wound.       Contact dermatitis to peristomal skin from contact with effluent.  Midline abdominal surgical wound  Psychiatric/Behavioral:  The patient is nervous/anxious.        Is hesitant to leave home for fear of pouch leaking.    All other systems reviewed and are negative. Vital signs:  BP (!) 153/74   Pulse 74   Temp 97.6 F (36.4 C) (Oral)   Resp 17   Ht 5\' 5"  (1.651 m)   Wt 52.6 kg   SpO2 100%   BMI 19.30 kg/m  Exam:  Physical Exam Vitals reviewed.  Constitutional:      Appearance: Normal appearance. She is normal weight.  Abdominal:     General: Abdomen is flat.     Comments: Midline surgical wound is pink and moist, fully granulated.  NS moist gauze dressing performed daily by spouse.    Neurological:     Mental Status: She is alert.  Psychiatric:        Mood and Affect: Mood normal.    Stoma type/location:  LLQ colostomy 1/2"  Stomal assessment/size: flush with skin, located in abdominal crease.  Peristomal assessment:  Denuded skin from 9 o'clock to 3 o'clock, tender and weeping.  When pouch leaks, stool pools and sits in this area.  (Leaks daily, she reports)  Treatment options for stomal/peristomal skin: Stoma powder and skin prep, switching to 2" slim barrier rings and cutting pouch appropriate size to match stoma.   Output: soft brown stool. Takes miralax daily to avoid thick, pasty stool Ostomy pouching: 1pc. Convex with barrier ring and belt. Adding barrier strips today for extra support and filling in abdominal crease with small pieces of  barrier ring to create a flat pouching surface Education provided:  Pouch change performed to demonstrate new barrier ring and opening size technique as well as barrier strips.     Impression/dx  COntact dermatitis Colostomy complication Discussion  Barrier ring application, barrier strips Plan  Will set up with [facility]. Is willing to try coloplast products as the current convex pouch is leaking often.  Will enroll in program and have samples sent.     Visit time: 55 minutes.   FNP-BC

## 2020-11-17 NOTE — Discharge Instructions (Signed)
One piece convex pouch New barrier ring (moldable and flatter Use 2 pieces at 3 and 9 o'clock then barrier ring all the way around.   Used opening already there. Did not cut larger, if leaks, cut to first line opening Wear belt and barrier strips on the edge.

## 2020-11-21 ENCOUNTER — Ambulatory Visit: Payer: Medicare PPO

## 2020-11-21 ENCOUNTER — Other Ambulatory Visit: Payer: Self-pay

## 2020-11-22 ENCOUNTER — Other Ambulatory Visit: Payer: Self-pay

## 2020-11-22 ENCOUNTER — Ambulatory Visit: Payer: Medicare PPO

## 2020-11-27 ENCOUNTER — Ambulatory Visit (INDEPENDENT_AMBULATORY_CARE_PROVIDER_SITE_OTHER): Payer: Medicare PPO

## 2020-11-27 ENCOUNTER — Other Ambulatory Visit: Payer: Self-pay

## 2020-11-27 DIAGNOSIS — Z0000 Encounter for general adult medical examination without abnormal findings: Secondary | ICD-10-CM

## 2020-11-27 DIAGNOSIS — Z933 Colostomy status: Secondary | ICD-10-CM

## 2020-11-27 DIAGNOSIS — Z1231 Encounter for screening mammogram for malignant neoplasm of breast: Secondary | ICD-10-CM

## 2020-11-27 DIAGNOSIS — M810 Age-related osteoporosis without current pathological fracture: Secondary | ICD-10-CM

## 2020-11-27 NOTE — Progress Notes (Signed)
Subjective:   Anne Aguirre is a 70 y.o. female who presents for an Initial Medicare Annual Wellness Visit. Marland Kitchenawv  Review of Systems    Defer To PCP Cardiac Risk Factors include: advanced age (>80men, >82 women);hypertension     Objective:    Today's Vitals   11/27/20 1501  PainSc: 0-No pain   There is no height or weight on file to calculate BMI.  Advanced Directives 11/27/2020 09/10/2020 08/23/2020  Does Patient Have a Medical Advance Directive? Yes No No  Type of Advance Directive Living will - -  Does patient want to make changes to medical advance directive? No - Patient declined - -  Would patient like information on creating a medical advance directive? - No - Patient declined No - Patient declined    Current Medications (verified) Outpatient Encounter Medications as of 11/27/2020  Medication Sig   amLODipine (NORVASC) 10 MG tablet Take 1 tablet (10 mg total) by mouth daily.   ferrous sulfate 325 (65 FE) MG EC tablet Take 1 tablet (325 mg total) by mouth every Monday, Wednesday, and Friday. Take separate from food if able. Avoid taking with calcium-containing foods and beverages.   Nebivolol HCl 20 MG TABS Take 1 tablet (20 mg total) by mouth daily at 12 noon.   No facility-administered encounter medications on file as of 11/27/2020.    Allergies (verified) Patient has no known allergies.   History: Past Medical History:  Diagnosis Date   Acute diverticulitis 08/23/2020   COVID-19 virus infection ---- +ve on 09/01/2020 09/10/2020   Essential hypertension, benign 10/01/2018   HLD (hyperlipidemia) 10/01/2018   Osteoarthritis 10/01/2018   Post-op intraabdominal/Liver abscess-Had Hartmann's with Colostomy on 08/29/2020 for Sigmoid Diverticulits 09/10/2020   s/p ex lap and Hartman's procedure with colostomy by AR 08/29/20 for diverticulitis with abscess-- Now with Liver/intrabdominal abscess---   Sepsis Due to Intraabdomonal Abscess 09/10/2020   Vitamin D deficiency disease  10/01/2018   Past Surgical History:  Procedure Laterality Date   ABDOMINAL SURGERY     COLON RESECTION N/A 08/29/2020   Procedure: COLON RESECTION;  Surgeon: Axel Filler, MD;  Location: [facility] OR;  Service: General;  Laterality: N/A;   COLON SURGERY     COLOSTOMY N/A 08/29/2020   Procedure: COLOSTOMY;  Surgeon: Axel Filler, MD;  Location: [facility] OR;  Service: General;  Laterality: N/A;   LAPAROTOMY N/A 08/29/2020   Procedure: EXPLORATORY LAPAROTOMY;  Surgeon: Axel Filler, MD;  Location: [facility] OR;  Service: General;  Laterality: N/A;   TONSILLECTOMY Bilateral    Family History  Problem Relation Age of Onset   Hypertension Mother    Stroke Mother    Stroke Father    Hypertension Brother    Hypertension Son    Social History   Socioeconomic History   Marital status: Not on file    Spouse name: [name]   Number of children: 2   Years of education: Not on file   Highest education level: Not on file  Occupational History   Not on file  Tobacco Use   Smoking status: Never   Smokeless tobacco: Never  Vaping Use   Vaping Use: Never used  Substance and Sexual Activity   Alcohol use: Never    Alcohol/week: 7.0 standard drinks    Types: 7 Glasses of wine per week    Comment: Red wine   Drug use: Not on file   Sexual activity: Not on file  Other Topics Concern   Not on file  Social History Narrative  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   Social Determinants of Health   Financial Resource Strain: Low Risk    Difficulty of Paying Living Expenses: Not hard at all  Food Insecurity: No Food Insecurity   Worried About Programme researcher, broadcasting/film/video in the Last Year: Never true   Ran Out of Food in the Last Year: Never true  Transportation Needs: No Transportation Needs   Lack of Transportation (Medical): No   Lack of Transportation (Non-Medical): No  Physical Activity: Sufficiently Active   Days of Exercise per Week: 5 days   Minutes of Exercise per Session: 30  min  Stress: No Stress Concern Present   Feeling of Stress : Not at all  Social Connections: Moderately Isolated   Frequency of Communication with Friends and Family: More than three times a week   Frequency of Social Gatherings with Friends and Family: More than three times a week   Attends Religious Services: Never   Database administrator or Organizations: No   Attends Banker Meetings: Never   Marital Status: Married  Financial risk analyst Needed?: No  Information entered by :: [employer] CMA   Activities of Daily Living In your present state of health, do you have any difficulty performing the following activities: 11/27/2020 08/24/2020  Hearing? N N  Vision? N N  Difficulty concentrating or making decisions? N N  Walking or climbing stairs? N N  Dressing or bathing? N N  Doing errands, shopping? N N  Preparing Food and eating ? N -  Using the Toilet? N -  In the past six months, have you accidently leaked urine? N -  Do you have problems with loss of bowel control? N -  Managing your Medications? N -  Managing your Finances? N -  Housekeeping or managing your Housekeeping? N -  Some recent data might be hidden    Patient Care Team: Anabel Halon, MD as PCP - General (Internal Medicine)  Indicate any recent Medical Services you may have received from other than [facility] providers in the past year (date may be approximate).     Assessment:   This is a routine wellness examination for [place].  Hearing/Vision screen No results found.  Dietary issues and exercise activities discussed: Current Exercise Habits: Home exercise routine, Type of exercise: walking, Time (Minutes): 30, Frequency (Times/Week): 5, Weekly Exercise (Minutes/Week): 150, Intensity: Moderate, Exercise limited by: cardiac condition(s)   Goals Addressed   None   Depression Screen PHQ 2/9 Scores 11/27/2020 10/30/2020 03/20/2020  PHQ - 2 Score 0 0 0  PHQ- 9 Score - - 0    Fall Risk Fall  Risk  11/27/2020 10/30/2020 03/20/2020  Falls in the past year? 0 0 1  Number falls in past yr: 0 0 0  Injury with Fall? 0 0 0  Risk for fall due to : No Fall Risks No Fall Risks -  Follow up Falls evaluation completed Falls evaluation completed -    FALL RISK PREVENTION PERTAINING TO THE HOME:  Any stairs in or around the home? No  If so, are there any without handrails?  N/a Home free of loose throw rugs in walkways, pet beds, electrical cords, etc? No  Adequate lighting in your home to reduce risk of falls? No   ASSISTIVE DEVICES UTILIZED TO PREVENT FALLS:  Life alert? Yes  Use of a cane, walker or w/c? No  Grab bars in the bathroom? Yes  Shower chair or bench in  shower? No  Elevated toilet seat or a handicapped toilet? Yes     Cognitive Function:     6CIT Screen 11/27/2020  What Year? 0 points  What month? 0 points  What time? 0 points  Count back from 20 4 points  Months in reverse 4 points  Repeat phrase 10 points  Total Score 18    Immunizations Immunization History  Administered Date(s) Administered   Fluad Quad(high Dose 65+) 11/02/2018, 10/21/2019, 10/30/2020   PFIZER(Purple Top)SARS-COV-2 Vaccination 02/19/2019, 03/12/2019, 10/20/2019   Pneumococcal Conjugate-13 03/20/2020   Td 03/21/2014    TDAP status: Up to date  Flu Vaccine status: Up to date  Pneumococcal vaccine status: Up to date  Covid-19 vaccine status: Information provided on how to obtain vaccines.   Qualifies for Shingles Vaccine? No   Zostavax completed No   Shingrix Completed?: No.    Education has been provided regarding the importance of this vaccine. Patient has been advised to call insurance company to determine out of pocket expense if they have not yet received this vaccine. Advised may also receive vaccine at local pharmacy or Health Dept. Verbalized acceptance and understanding.  Screening Tests Health Maintenance  Topic Date Due   MAMMOGRAM  Never done   Zoster Vaccines-  Shingrix (1 of 2) Never done   DEXA SCAN  Never done   COVID-19 Vaccine (4 - Booster for Pfizer series) 12/15/2019   COLONOSCOPY (Pts 45-2yrs Insurance coverage will need to be confirmed)  03/20/2021 (Originally 01/14/1997)   Pneumonia Vaccine 51+ Years old (2 - PPSV23 if available, else PCV20) 03/20/2021   TETANUS/TDAP  03/20/2024   INFLUENZA VACCINE  Completed   Hepatitis C Screening  Completed   HPV VACCINES  Aged Out    Health Maintenance  Health Maintenance Due  Topic Date Due   MAMMOGRAM  Never done   Zoster Vaccines- Shingrix (1 of 2) Never done   DEXA SCAN  Never done   COVID-19 Vaccine (4 - Booster for Pfizer series) 12/15/2019    Colorectal cancer screening: Referral to GI placed 11/27/2020. Pt aware the office will call re: appt.  Mammogram status: Ordered 11/27/2020. Pt provided with contact info and advised to call to schedule appt.   Bone Density status: Ordered 11/27/2020. Pt provided with contact info and advised to call to schedule appt.  Lung Cancer Screening: (Low Dose CT Chest recommended if Age 32-80 years, 30 pack-year currently smoking OR have quit w/in 15years.) does not qualify.   Lung Cancer Screening Referral: n/a  Additional Screening:  Hepatitis C Screening: does not qualify; Completed 03/20/2020  Vision Screening: Recommended annual ophthalmology exams for early detection of glaucoma and other disorders of the eye. Is the patient up to date with their annual eye exam?  Yes  Who is the provider or what is the name of the office in which the patient attends annual eye exams? [facility] If pt is not established with a provider, would they like to be referred to a provider to establish care?  N/a .   Dental Screening: Recommended annual dental exams for proper oral hygiene  Community Resource Referral / Chronic Care Management: CRR required this visit?  No   CCM required this visit?  No      Plan:     I have personally reviewed and noted the  following in the patient's chart:   Medical and social history Use of alcohol, tobacco or illicit drugs  Current medications and supplements including opioid prescriptions. Patient  is not currently taking opioid prescriptions. Functional ability and status Nutritional status Physical activity Advanced directives List of other physicians Hospitalizations, surgeries, and ER visits in previous 12 months Vitals Screenings to include cognitive, depression, and falls Referrals and appointments  In addition, I have reviewed and discussed with patient certain preventive protocols, quality metrics, and best practice recommendations. A written personalized care plan for preventive services as well as general preventive health recommendations were provided to patient.     Glendale Chard, CMA   11/27/2020   Nurse Notes: .awv

## 2020-11-27 NOTE — Patient Instructions (Signed)

## 2020-11-29 ENCOUNTER — Encounter (INDEPENDENT_AMBULATORY_CARE_PROVIDER_SITE_OTHER): Payer: Self-pay | Admitting: *Deleted

## 2020-11-29 NOTE — Progress Notes (Addendum)
Subjective:   Anne Aguirre is a 70 y.o. female who presents for an Initial Medicare Annual Wellness Visit. I connected with  Anne Aguirre on 11/29/20 by a audio enabled telemedicine application and verified that I am speaking with the correct person using two identifiers.  Patient Location: Home  Provider Location: Office/Clinic  I discussed the limitations of evaluation and management by telemedicine. The patient expressed understanding and agreed to proceed.   Review of Systems    Defer To PCP Cardiac Risk Factors include: advanced age (>36men, >53 women);hypertension     Objective:    Today's Vitals   11/27/20 1501  PainSc: 0-No pain   There is no height or weight on file to calculate BMI.  Advanced Directives 11/27/2020 09/10/2020 08/23/2020  Does Patient Have a Medical Advance Directive? Yes No No  Type of Advance Directive Living will - -  Does patient want to make changes to medical advance directive? No - Patient declined - -  Would patient like information on creating a medical advance directive? - No - Patient declined No - Patient declined    Current Medications (verified) Outpatient Encounter Medications as of 11/27/2020  Medication Sig   amLODipine (NORVASC) 10 MG tablet Take 1 tablet (10 mg total) by mouth daily.   ferrous sulfate 325 (65 FE) MG EC tablet Take 1 tablet (325 mg total) by mouth every Monday, Wednesday, and Friday. Take separate from food if able. Avoid taking with calcium-containing foods and beverages.   Nebivolol HCl 20 MG TABS Take 1 tablet (20 mg total) by mouth daily at 12 noon.   No facility-administered encounter medications on file as of 11/27/2020.    Allergies (verified) Patient has no known allergies.   History: Past Medical History:  Diagnosis Date   Acute diverticulitis 08/23/2020   COVID-19 virus infection ---- +ve on 09/01/2020 09/10/2020   Essential hypertension, benign 10/01/2018   HLD (hyperlipidemia) 10/01/2018    Osteoarthritis 10/01/2018   Post-op intraabdominal/Liver abscess-Had Hartmann's with Colostomy on 08/29/2020 for Sigmoid Diverticulits 09/10/2020   s/p ex lap and Hartman's procedure with colostomy by AR 08/29/20 for diverticulitis with abscess-- Now with Liver/intrabdominal abscess---   Sepsis Due to Intraabdomonal Abscess 09/10/2020   Vitamin D deficiency disease 10/01/2018   Past Surgical History:  Procedure Laterality Date   ABDOMINAL SURGERY     COLON RESECTION N/A 08/29/2020   Procedure: COLON RESECTION;  Surgeon: Axel Filler, MD;  Location: [facility] OR;  Service: General;  Laterality: N/A;   COLON SURGERY     COLOSTOMY N/A 08/29/2020   Procedure: COLOSTOMY;  Surgeon: Axel Filler, MD;  Location: [facility] OR;  Service: General;  Laterality: N/A;   LAPAROTOMY N/A 08/29/2020   Procedure: EXPLORATORY LAPAROTOMY;  Surgeon: Axel Filler, MD;  Location: [facility] OR;  Service: General;  Laterality: N/A;   TONSILLECTOMY Bilateral    Family History  Problem Relation Age of Onset   Hypertension Mother    Stroke Mother    Stroke Father    Hypertension Brother    Hypertension Son    Social History   Socioeconomic History   Marital status: Not on file    Spouse name: Anne Aguirre   Number of children: 2   Years of education: Not on file   Highest education level: Not on file  Occupational History   Not on file  Tobacco Use   Smoking status: Never   Smokeless tobacco: Never  Vaping Use   Vaping Use: Never used  Substance and Sexual Activity  Alcohol use: Never    Alcohol/week: 7.0 standard drinks    Types: 7 Glasses of wine per week    Comment: Red wine   Drug use: Not on file   Sexual activity: Not on file  Other Topics Concern   Not on file  Social History Narrative   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   Social Determinants of Health   Financial Resource Strain: Low Risk    Difficulty of Paying Living Expenses: Not hard at all  Food Insecurity: No Food  Insecurity   Worried About Programme researcher, broadcasting/film/video in the Last Year: Never true   Ran Out of Food in the Last Year: Never true  Transportation Needs: No Transportation Needs   Lack of Transportation (Medical): No   Lack of Transportation (Non-Medical): No  Physical Activity: Sufficiently Active   Days of Exercise per Week: 5 days   Minutes of Exercise per Session: 30 min  Stress: No Stress Concern Present   Feeling of Stress : Not at all  Social Connections: Moderately Isolated   Frequency of Communication with Friends and Family: More than three times a week   Frequency of Social Gatherings with Friends and Family: More than three times a week   Attends Religious Services: Never   Database administrator or Organizations: No   Attends Banker Meetings: Never   Marital Status: Married  Financial risk analyst Needed?: No  Information entered by :: [employer] CMA   Activities of Daily Living In your present state of health, do you have any difficulty performing the following activities: 11/27/2020 08/24/2020  Hearing? N N  Vision? N N  Difficulty concentrating or making decisions? N N  Walking or climbing stairs? N N  Dressing or bathing? N N  Doing errands, shopping? N N  Preparing Food and eating ? N -  Using the Toilet? N -  In the past six months, have you accidently leaked urine? N -  Do you have problems with loss of bowel control? N -  Managing your Medications? N -  Managing your Finances? N -  Housekeeping or managing your Housekeeping? N -  Some recent data might be hidden    Patient Care Team: Anabel Halon, MD as PCP - General (Internal Medicine)  Indicate any recent Medical Services you may have received from other than [facility] providers in the past year (date may be approximate).     Assessment:   This is a routine wellness examination for Anne Aguirre.  Hearing/Vision screen No results found.  Dietary issues and exercise activities discussed: Current  Exercise Habits: Home exercise routine, Type of exercise: walking, Time (Minutes): 30, Frequency (Times/Week): 5, Weekly Exercise (Minutes/Week): 150, Intensity: Moderate, Exercise limited by: cardiac condition(s)   Goals Addressed   None   Depression Screen PHQ 2/9 Scores 11/27/2020 10/30/2020 03/20/2020  PHQ - 2 Score 0 0 0  PHQ- 9 Score - - 0    Fall Risk Fall Risk  11/27/2020 10/30/2020 03/20/2020  Falls in the past year? 0 0 1  Number falls in past yr: 0 0 0  Injury with Fall? 0 0 0  Risk for fall due to : No Fall Risks No Fall Risks -  Follow up Falls evaluation completed Falls evaluation completed -    FALL RISK PREVENTION PERTAINING TO THE HOME:  Any stairs in or around the home? No  If so, are there any without handrails?  N/a Home free of loose throw  rugs in walkways, pet beds, electrical cords, etc? No  Adequate lighting in your home to reduce risk of falls? No   ASSISTIVE DEVICES UTILIZED TO PREVENT FALLS:  Life alert? Yes  Use of a cane, walker or w/c? No  Grab bars in the bathroom? Yes  Shower chair or bench in shower? No  Elevated toilet seat or a handicapped toilet? Yes     Cognitive Function:     6CIT Screen 11/27/2020  What Year? 0 points  What month? 0 points  What time? 0 points  Count back from 20 4 points  Months in reverse 4 points  Repeat phrase 10 points  Total Score 18    Immunizations Immunization History  Administered Date(s) Administered   Fluad Quad(high Dose 65+) 11/02/2018, 10/21/2019, 10/30/2020   PFIZER(Purple Top)SARS-COV-2 Vaccination 02/19/2019, 03/12/2019, 10/20/2019   Pneumococcal Conjugate-13 03/20/2020   Td 03/21/2014    TDAP status: Up to date  Flu Vaccine status: Up to date  Pneumococcal vaccine status: Up to date  Covid-19 vaccine status: Information provided on how to obtain vaccines.   Qualifies for Shingles Vaccine? No   Zostavax completed No   Shingrix Completed?: No.    Education has been provided  regarding the importance of this vaccine. Patient has been advised to call insurance company to determine out of pocket expense if they have not yet received this vaccine. Advised may also receive vaccine at local pharmacy or Health Dept. Verbalized acceptance and understanding.  Screening Tests Health Maintenance  Topic Date Due   MAMMOGRAM  Never done   Zoster Vaccines- Shingrix (1 of 2) Never done   DEXA SCAN  Never done   COVID-19 Vaccine (4 - Booster for Pfizer series) 12/15/2019   COLONOSCOPY (Pts 45-82yrs Insurance coverage will need to be confirmed)  03/20/2021 (Originally 01/14/1997)   Pneumonia Vaccine 71+ Years old (2 - PPSV23 if available, else PCV20) 03/20/2021   TETANUS/TDAP  03/20/2024   INFLUENZA VACCINE  Completed   Hepatitis C Screening  Completed   HPV VACCINES  Aged Out    Health Maintenance  Health Maintenance Due  Topic Date Due   MAMMOGRAM  Never done   Zoster Vaccines- Shingrix (1 of 2) Never done   DEXA SCAN  Never done   COVID-19 Vaccine (4 - Booster for Pfizer series) 12/15/2019    Colorectal cancer screening: Referral to GI placed 11/27/2020. Pt aware the office will call re: appt.  Mammogram status: Ordered 11/27/2020. Pt provided with contact info and advised to call to schedule appt.   Bone Density status: Ordered 11/27/2020. Pt provided with contact info and advised to call to schedule appt.  Lung Cancer Screening: (Low Dose CT Chest recommended if Age 98-80 years, 30 pack-year currently smoking OR have quit w/in 15years.) does not qualify.   Lung Cancer Screening Referral: n/a  Additional Screening:  Hepatitis C Screening: does not qualify; Completed 03/20/2020  Vision Screening: Recommended annual ophthalmology exams for early detection of glaucoma and other disorders of the eye. Is the patient up to date with their annual eye exam?  Yes  Who is the provider or what is the name of the office in which the patient attends annual eye exams? [facility] Dr If pt is not established with a provider, would they like to be referred to a provider to establish care?  N/a .   Dental Screening: Recommended annual dental exams for proper oral hygiene  Community Resource Referral / Chronic Care Management: CRR required this  visit?  No   CCM required this visit?  No      Plan:     I have personally reviewed and noted the following in the patient's chart:   Medical and social history Use of alcohol, tobacco or illicit drugs  Current medications and supplements including opioid prescriptions. Patient is not currently taking opioid prescriptions. Functional ability and status Nutritional status Physical activity Advanced directives List of other physicians Hospitalizations, surgeries, and ER visits in previous 12 months Vitals Screenings to include cognitive, depression, and falls Referrals and appointments  In addition, I have reviewed and discussed with patient certain preventive protocols, quality metrics, and best practice recommendations. A written personalized care plan for preventive services as well as general preventive health recommendations were provided to patient.     Glendale Chard, CMA   11/29/2020   Nurse Notes:  Ms. Arista , Thank you for taking time to come for your Medicare Wellness Visit. I appreciate your ongoing commitment to your health goals. Please review the following plan we discussed and let me know if I can assist you in the future.   These are the goals we discussed:  Goals   None     This is a list of the screening recommended for you and due dates:  Health Maintenance  Topic Date Due   Mammogram  Never done   Zoster (Shingles) Vaccine (1 of 2) Never done   DEXA scan (bone density measurement)  Never done   COVID-19 Vaccine (4 - Booster for Pfizer series) 12/15/2019   Colon Cancer Screening  03/20/2021*   Pneumonia Vaccine (2 - PPSV23 if available, else PCV20) 03/20/2021   Tetanus Vaccine   03/20/2024   Flu Shot  Completed   Hepatitis C Screening: USPSTF Recommendation to screen - Ages 18-79 yo.  Completed   HPV Vaccine  Aged Out  *Topic was postponed. The date shown is not the original due date.

## 2020-11-30 DIAGNOSIS — Z8719 Personal history of other diseases of the digestive system: Secondary | ICD-10-CM | POA: Diagnosis not present

## 2020-11-30 DIAGNOSIS — Z1211 Encounter for screening for malignant neoplasm of colon: Secondary | ICD-10-CM | POA: Diagnosis not present

## 2020-12-28 DIAGNOSIS — K5732 Diverticulitis of large intestine without perforation or abscess without bleeding: Secondary | ICD-10-CM | POA: Diagnosis not present

## 2021-01-04 ENCOUNTER — Ambulatory Visit (INDEPENDENT_AMBULATORY_CARE_PROVIDER_SITE_OTHER): Payer: Medicare PPO | Admitting: Gastroenterology

## 2021-01-04 ENCOUNTER — Other Ambulatory Visit: Payer: Self-pay

## 2021-01-04 ENCOUNTER — Telehealth (HOSPITAL_BASED_OUTPATIENT_CLINIC_OR_DEPARTMENT_OTHER): Payer: Self-pay

## 2021-01-04 ENCOUNTER — Encounter (INDEPENDENT_AMBULATORY_CARE_PROVIDER_SITE_OTHER): Payer: Self-pay | Admitting: Gastroenterology

## 2021-01-04 VITALS — BP 154/90 | HR 96 | Temp 98.6°F | Ht 65.0 in | Wt 121.5 lb

## 2021-01-04 DIAGNOSIS — Z933 Colostomy status: Secondary | ICD-10-CM

## 2021-01-04 DIAGNOSIS — K5732 Diverticulitis of large intestine without perforation or abscess without bleeding: Secondary | ICD-10-CM

## 2021-01-04 NOTE — Progress Notes (Signed)
i  Referring Provider: Kerri Perches, MD Primary Care Physician:  Anabel Halon, MD Primary GI Physician: Newly established  Chief Complaint  Patient presents with   Diverticulitis    Pt arrives today as a new patient with husband Anne Aguirre. Has a colostomy. Was suppose to have colonoscopy but opening of colostomy was too small.    HPI:   Anne Aguirre is a 70 y.o. female with past medical history of HTN, HLD, osteoarthritis, complicated diverticulitis s/p Hartmann's with colostomy, vitamin D deficiency.   Patient presenting today as a new patient for follow up of acute, complicated diverticulitis in August.  Patient presented to hospital 8/10 after experiencing lower abdominal and back pain x2 weeks,  She was found to have sigmoid diverticulitis, started on IV abx, however, pain and WBC increased,  She underwent repeat ct scan that showed a 5.8x4.3 cm right pelvic collection with some free air.  She also has small bowel dilation. She was then transferred to [facility] for higher level of care and possible IR drainage, however, IR determiend there was no drainable abscess, she was treated with Burnetta Sabin with general surg follow up, however, pain and leukocytosis persisted requiring her to undergo exploratory laparotomy with colon resection and colostomy formation.   She was referred to [facility] to have follow up colonoscopy after acute diverticulitis, She states that she had colonoscopy last Thursday but they were unable to proceed with evaluation of entire colon, they evaluated lower portion but due to size of her ostomy, they were unable to pass the scope to evaluate the upper portion of her colon, she went to dr Derrell Lolling who dilated her ostomy to 63mm with attempted repeat colonoscopy thereafter, however, they were still unable to pass scope through ostomy site. It was recommended that they may hold off on trying to complete colonoscopy and just have  reevaluation during the takedown of her colostomy.  She is meeting with GI surgeon in January for further evaluation and discussion of ostomy take down. She has not had any blood in her stools or black stools, she denies flatulence. States she is unsure what kind of diet she needs to be on, she denies any fevers, chills, abdominal pain, nausea or vomiting. Overall she feels that she is doing well.   Last Colonoscopy:never  Last Endoscopy:never  Past Medical History:  Diagnosis Date   Acute diverticulitis 08/23/2020   COVID-19 virus infection ---- +ve on 09/01/2020 09/10/2020   Essential hypertension, benign 10/01/2018   HLD (hyperlipidemia) 10/01/2018   Osteoarthritis 10/01/2018   Post-op intraabdominal/Liver abscess-Had Hartmann's with Colostomy on 08/29/2020 for Sigmoid Diverticulits 09/10/2020   s/p ex lap and Hartman's procedure with colostomy by [facility] 08/29/20 for diverticulitis with abscess-- Now with Liver/intrabdominal abscess---   Sepsis Due to Intraabdomonal Abscess 09/10/2020   Vitamin D deficiency disease 10/01/2018    Past Surgical History:  Procedure Laterality Date   ABDOMINAL SURGERY     COLON RESECTION N/A 08/29/2020   Procedure: COLON RESECTION;  Surgeon: Axel Filler, MD;  Location: [facility] OR;  Service: General;  Laterality: N/A;   COLON SURGERY     COLOSTOMY N/A 08/29/2020   Procedure: COLOSTOMY;  Surgeon: Axel Filler, MD;  Location: [facility] OR;  Service: General;  Laterality: N/A;   LAPAROTOMY N/A 08/29/2020   Procedure: EXPLORATORY LAPAROTOMY;  Surgeon: Axel Filler, MD;  Location: [facility] OR;  Service: General;  Laterality: N/A;   TONSILLECTOMY Bilateral     Current Outpatient Medications  Medication Sig Dispense Refill   amLODipine (  NORVASC) 10 MG tablet Take 1 tablet (10 mg total) by mouth daily. 90 tablet 1   Nebivolol HCl 20 MG TABS Take 1 tablet (20 mg total) by mouth daily at 12 noon. 90 tablet 0   ferrous sulfate 325 (65 FE) MG EC tablet Take 1 tablet (325 mg total) by mouth every Monday, Wednesday, and Friday. Take  separate from food if able. Avoid taking with calcium-containing foods and beverages. (Patient not taking: Reported on 01/04/2021) 30 tablet 1   No current facility-administered medications for this visit.    Allergies as of 01/04/2021   (No Known Allergies)    Family History  Problem Relation Age of Onset   Hypertension Mother    Stroke Mother    Stroke Father    Hypertension Brother    Hypertension Son     Social History   Socioeconomic History   Marital status: Not on file    Spouse name: [name]   Number of children: 2   Years of education: Not on file   Highest education level: Not on file  Occupational History   Not on file  Tobacco Use   Smoking status: Never   Smokeless tobacco: Never  Vaping Use   Vaping Use: Never used  Substance and Sexual Activity   Alcohol use: Not Currently    Comment: occasional wine   Drug use: Not on file   Sexual activity: Not on file  Other Topics Concern   Not on file  Social History Narrative   Married for 12 years.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   Social Determinants of Health   Financial Resource Strain: Low Risk    Difficulty of Paying Living Expenses: Not hard at all  Food Insecurity: No Food Insecurity   Worried About Programme researcher, broadcasting/film/video in the Last Year: Never true   Ran Out of Food in the Last Year: Never true  Transportation Needs: No Transportation Needs   Lack of Transportation (Medical): No   Lack of Transportation (Non-Medical): No  Physical Activity: Sufficiently Active   Days of Exercise per Week: 5 days   Minutes of Exercise per Session: 30 min  Stress: No Stress Concern Present   Feeling of Stress : Not at all  Social Connections: Moderately Isolated   Frequency of Communication with Friends and Family: More than three times a week   Frequency of Social Gatherings with Friends and Family: More than three times a week   Attends Religious Services: Never   Database administrator or Organizations: No    Attends Engineer, structural: Never   Marital Status: Married   Review of systems General: negative for malaise, night sweats, fever, chills, weight loss Neck: Negative for lumps, goiter, pain and significant neck swelling Resp: Negative for cough, wheezing, dyspnea at rest CV: Negative for chest pain, leg swelling, palpitations, orthopnea GI: denies melena, hematochezia, nausea, vomiting, diarrhea, constipation, dysphagia, odyonophagia, early satiety or unintentional weight loss.  MSK: Negative for joint pain or swelling, back pain, and muscle pain. Derm: Negative for itching or rash Psych: Denies depression, anxiety, memory loss, confusion. No homicidal or suicidal ideation.  Heme: Negative for prolonged bleeding, bruising easily, and swollen nodes. Endocrine: Negative for cold or heat intolerance, polyuria, polydipsia and goiter. Neuro: negative for tremor, gait imbalance, syncope and seizures. The remainder of the review of systems is noncontributory.  Physical Exam: BP (!) 154/90 (BP Location: Right Arm, Patient Position: Sitting, Cuff Size: Normal)    Pulse 96  Temp 98.6 F (37 C) (Oral)    Ht 5\' 5"  (1.651 m)    Wt 121 lb 8 oz (55.1 kg)    BMI 20.22 kg/m  General:   Alert and oriented. No distress noted. Pleasant and cooperative.  Head:  Normocephalic and atraumatic. Eyes:  Conjuctiva clear without scleral icterus. Mouth:  Oral mucosa pink and moist. Good dentition. No lesions. Heart: Normal rate and rhythm, s1 and s2 heart sounds present.  Lungs: Clear lung sounds in all lobes. Respirations equal and unlabored. Abdomen:  +BS, soft, non-tender and non-distended. No rebound or guarding. No HSM or masses noted. Left abdominal ostomy site. Derm: No palmar erythema or jaundice Msk:  Symmetrical without gross deformities. Normal posture. Extremities:  Without edema. Neurologic:  Alert and  oriented x4 Psych:  Alert and cooperative. Normal mood and affect.  Invalid  input(s): 6 MONTHS   ASSESSMENT: Shamell Hittle is a 70 y.o. female presenting today for follow up after complicated diverticulitis requiring colectomy and colostomy in August.  Given [facility] was unable to complete upper portion of colonoscopy due to size of ostomy site, Dr. September evaluated patient's ostomy site during visit. It was determined that ostomy site is very narrow and he was unsure if smallest pediatric scope would pass through. He advised patient that we could attempt to complete upper portion of colonoscopy but cannnot guarantee that it will be able to be completed, or patient could see GI surgeon in January and discuss situation further with them as they may be able to evaluate the rest of her colon during ostomy take down and she could have follow up colonoscopy of entire colon 2-3 months after takedown, with Dr. February. Patient and husband verbalized understanding.   PLAN:  Patient is to let Levon Hedger know if she would like to proceed with an attempted colonoscopy with Dr. [place] with smallest pediatric scope available, with the possibility that scope would not be small enough to pass through her ostomy site, or if she would like to see GI surgeon in January and plan for complete colonoscopy 2-3 months after colostomy takedown.    Follow Up: TBD  Jhon Mallozzi L. February, MSN, APRN, AGNP-C Adult-Gerontology Nurse Practitioner [facility]

## 2021-01-04 NOTE — Patient Instructions (Signed)
As Dr. Levon Hedger discussed with you, we can try to use our smallest pediatric scope to go through your colostomy for evaluation of the upper part of your colon, or you can discuss further with your surgeon in January about them evaluating further during take down of the colostomy and we can complete full colonoscopy 2-3 months after your surgery. You can give [place] a call and let us know what you decide.

## 2021-01-04 NOTE — Telephone Encounter (Signed)
Received a refill request from pharmacy for nebevilol Patient has established with Dr. Allena Katz Will need to refer to him for refill authorization

## 2021-01-06 ENCOUNTER — Other Ambulatory Visit (HOSPITAL_BASED_OUTPATIENT_CLINIC_OR_DEPARTMENT_OTHER): Payer: Self-pay | Admitting: Family Medicine

## 2021-01-09 DIAGNOSIS — K5732 Diverticulitis of large intestine without perforation or abscess without bleeding: Secondary | ICD-10-CM | POA: Insufficient documentation

## 2021-01-10 ENCOUNTER — Telehealth: Payer: Self-pay | Admitting: Internal Medicine

## 2021-01-10 ENCOUNTER — Other Ambulatory Visit: Payer: Self-pay | Admitting: *Deleted

## 2021-01-10 ENCOUNTER — Other Ambulatory Visit (HOSPITAL_BASED_OUTPATIENT_CLINIC_OR_DEPARTMENT_OTHER): Payer: Self-pay | Admitting: Family Medicine

## 2021-01-10 MED ORDER — NEBIVOLOL HCL 20 MG PO TABS: 20.0000 mg | ORAL_TABLET | Freq: Every day | ORAL | 0 refills | Status: DC

## 2021-01-10 NOTE — Telephone Encounter (Signed)
Pt medication sent to pharmacy  

## 2021-01-10 NOTE — Telephone Encounter (Signed)
Please call this in to the pharmacy Nebivolol HCl 20 MG TABS

## 2021-01-22 DIAGNOSIS — K9403 Colostomy malfunction: Secondary | ICD-10-CM | POA: Diagnosis not present

## 2021-01-25 ENCOUNTER — Telehealth (INDEPENDENT_AMBULATORY_CARE_PROVIDER_SITE_OTHER): Payer: Self-pay | Admitting: *Deleted

## 2021-01-25 NOTE — Telephone Encounter (Signed)
Surgery scheduled feb 3rd

## 2021-01-25 NOTE — Telephone Encounter (Signed)
Anne Aguirre received a letter from [facility] stating dr white would like patient to proceed with colonoscopy scheduling and his office is in the process of scheduling her for revision of colostomy stenosis.  Per Anne Aguirre call [facility] and ask if colonoscopy needs to be done before or after revision.   I received a voicemail from Diane - Tax adviser at [facility] stating Dr. Cliffton Asters will revise stomach during his surgery so scope can go through and that it can be done down the road in the weeks following.

## 2021-01-26 ENCOUNTER — Other Ambulatory Visit: Payer: Self-pay | Admitting: Surgery

## 2021-01-29 ENCOUNTER — Other Ambulatory Visit: Payer: Self-pay | Admitting: Surgery

## 2021-01-29 DIAGNOSIS — Z933 Colostomy status: Secondary | ICD-10-CM

## 2021-01-29 NOTE — Progress Notes (Signed)
Surgery orders requested with Dr. White's office.   ?

## 2021-01-31 ENCOUNTER — Ambulatory Visit: Payer: Self-pay | Admitting: Surgery

## 2021-02-01 ENCOUNTER — Ambulatory Visit: Payer: Medicare PPO | Admitting: Internal Medicine

## 2021-02-06 NOTE — Patient Instructions (Addendum)
DUE TO COVID-19 ONLY ONE VISITOR IS ALLOWED TO COME WITH YOU AND STAY IN THE WAITING ROOM ONLY DURING PRE OP AND PROCEDURE DAY OF SURGERY IF YOU ARE GOING HOME AFTER SURGERY. IF YOU ARE SPENDING THE NIGHT 2 PEOPLE MAY VISIT WITH YOU IN YOUR PRIVATE ROOM AFTER SURGERY UNTIL VISITING  HOURS ARE OVER AT 800 PM AND 1  VISITOR  MAY  SPEND THE NIGHT.                 Denetta Fei     Your procedure is scheduled on: 02/16/21   Report to [facility] Main  Entrance   Report to admitting at  11:15 AM     Call this number if you have problems the morning of surgery [phone]    Remember: Do not eat food or drink :After Midnight the night before your surgery,    Follow diet and bowel prep instructions from the Dr's office        BRUSH YOUR TEETH MORNING OF SURGERY AND RINSE YOUR MOUTH OUT, NO CHEWING GUM CANDY OR MINTS.     Take these medicines the morning of surgery with A SIP OF WATER: Nebivolol, Amlodipine                                You may not have any metal on your body including hair pins and              piercings  Do not wear jewelry, make-up, lotions, powders or perfumes, deodorant             Do not wear nail polish on your fingernails.  Do not shave  48 hours prior to surgery.              .   Do not bring valuables to the hospital. [facility] IS NOT             RESPONSIBLE   FOR VALUABLES.  Contacts, dentures or bridgework may not be worn into surgery.     Patients discharged the day of surgery will not be allowed to drive home.  IF YOU ARE HAVING SURGERY AND GOING HOME THE SAME DAY, YOU MUST HAVE AN ADULT TO DRIVE YOU HOME AND BE WITH YOU FOR 24 HOURS. YOU MAY GO HOME BY TAXI OR UBER OR ORTHERWISE, BUT AN ADULT MUST ACCOMPANY YOU HOME AND STAY WITH YOU FOR 24 HOURS.  Name and phone number of your driver:  Special Instructions: N/A              Please read over the following fact sheets you were  given: _____________________________________________________________________             [facility] - Preparing for Surgery Before surgery, you can play an important role.  Because skin is not sterile, your skin needs to be as free of germs as possible.  You can reduce the number of germs on your skin by washing with CHG (chlorahexidine gluconate) soap before surgery.  CHG is an antiseptic cleaner which kills germs and bonds with the skin to continue killing germs even after washing. Please DO NOT use if you have an allergy to CHG or antibacterial soaps.  If your skin becomes reddened/irritated stop using the CHG and inform your nurse when you arrive at Short Stay. Do not shave (including legs and underarms) for at least 48 hours prior to the first  CHG shower.   Please follow these instructions carefully:  1.  Shower with CHG Soap the night before surgery and the  morning of Surgery.  2.  If you choose to wash your hair, wash your hair first as usual with your  normal  shampoo.  3.  After you shampoo, rinse your hair and body thoroughly to remove the  shampoo.                            4.  Use CHG as you would any other liquid soap.  You can apply chg directly  to the skin and wash                       Gently with a scrungie or clean washcloth.  5.  Apply the CHG Soap to your body ONLY FROM THE NECK DOWN.   Do not use on face/ open                           Wound or open sores. Avoid contact with eyes, ears mouth and genitals (private parts).                       Wash face,  Genitals (private parts) with your normal soap.             6.  Wash thoroughly, paying special attention to the area where your surgery  will be performed.  7.  Thoroughly rinse your body with warm water from the neck down.  8.  DO NOT shower/wash with your normal soap after using and rinsing off  the CHG Soap.                9.  Pat yourself dry with a clean towel.            10.  Wear clean pajamas.            11.   Place clean sheets on your bed the night of your first shower and do not  sleep with pets. Day of Surgery : Do not apply any lotions/deodorants the morning of surgery.  Please wear clean clothes to the hospital/surgery center.  FAILURE TO FOLLOW THESE INSTRUCTIONS MAY RESULT IN THE CANCELLATION OF YOUR SURGERY PATIENT SIGNATURE_________________________________  NURSE SIGNATURE__________________________________  ________________________________________________________________________

## 2021-02-07 ENCOUNTER — Other Ambulatory Visit: Payer: Self-pay

## 2021-02-07 ENCOUNTER — Encounter (HOSPITAL_COMMUNITY): Payer: Self-pay

## 2021-02-07 ENCOUNTER — Encounter (HOSPITAL_COMMUNITY)
Admission: RE | Admit: 2021-02-07 | Discharge: 2021-02-07 | Disposition: A | Discharge status: Home / Self Care | Payer: Medicare PPO | Source: Ambulatory Visit | Attending: Surgery | Admitting: Surgery

## 2021-02-07 VITALS — BP 161/70 | HR 64 | Temp 98.2°F | Resp 18 | Ht 65.0 in | Wt 122.0 lb

## 2021-02-07 DIAGNOSIS — I10 Essential (primary) hypertension: Secondary | ICD-10-CM | POA: Insufficient documentation

## 2021-02-07 DIAGNOSIS — Z01812 Encounter for preprocedural laboratory examination: Secondary | ICD-10-CM | POA: Insufficient documentation

## 2021-02-07 LAB — BASIC METABOLIC PANEL
Anion gap: 8 (ref 5–15)
BUN: 14 mg/dL (ref 8–23)
CO2: 22 mmol/L (ref 22–32)
Calcium: 10.3 mg/dL (ref 8.9–10.3)
Chloride: 103 mmol/L (ref 98–111)
Creatinine, Ser: 0.74 mg/dL (ref 0.44–1.00)
GFR, Estimated: >60 mL/min (ref >60)
Glucose, Bld: 109 mg/dL — ABNORMAL HIGH (ref 70–99)
Potassium: 4.4 mmol/L (ref 3.5–5.1)
Sodium: 133 mmol/L — ABNORMAL LOW (ref 135–145)

## 2021-02-07 LAB — CBC
HCT: 41.6 % (ref 36.0–46.0)
Hemoglobin: 14.3 g/dL (ref 12.0–15.0)
MCH: 31.5 pg (ref 26.0–34.0)
MCHC: 34.4 g/dL (ref 30.0–36.0)
MCV: 91.6 fL (ref 80.0–100.0)
Platelets: 338 10*3/uL (ref 150–400)
RBC: 4.54 MIL/uL (ref 3.87–5.11)
RDW: 12.8 % (ref 11.5–15.5)
WBC: 9 10*3/uL (ref 4.0–10.5)
nRBC: 0 % (ref 0.0–0.2)

## 2021-02-07 NOTE — Progress Notes (Signed)
COVID test- NA    PCP - Dr. Shawnie Pons Cardiologist - no  Chest x-ray - no EKG - no Stress Test - no ECHO - no Cardiac Cath - no Pacemaker/ICD device last checked:NA  Sleep Study - no CPAP -   Fasting Blood Sugar - NA Checks Blood Sugar _____ times a day  Blood Thinner Instructions:NA Aspirin Instructions: Last Dose:  Anesthesia review: no  Patient denies shortness of breath, fever, cough and chest pain at PAT appointment No SOB with any activities  Patient verbalized understanding of instructions that were given to them at the PAT appointment. Patient was also instructed that they will need to review over the PAT instructions again at home before surgery. yes

## 2021-02-08 NOTE — Telephone Encounter (Signed)
error 

## 2021-02-16 ENCOUNTER — Ambulatory Visit (HOSPITAL_COMMUNITY): Payer: Medicare PPO | Admitting: Registered Nurse

## 2021-02-16 ENCOUNTER — Encounter (HOSPITAL_COMMUNITY)
Admission: RE | Disposition: A | Discharge status: Home / Self Care | Payer: Self-pay | Source: Ambulatory Visit | Attending: Surgery

## 2021-02-16 ENCOUNTER — Ambulatory Visit (HOSPITAL_COMMUNITY)
Admission: RE | Admit: 2021-02-16 | Discharge: 2021-02-16 | Disposition: A | Discharge status: Home / Self Care | Payer: Medicare PPO | Source: Ambulatory Visit | Attending: Surgery | Admitting: Surgery

## 2021-02-16 ENCOUNTER — Encounter (HOSPITAL_COMMUNITY): Payer: Self-pay | Admitting: Surgery

## 2021-02-16 DIAGNOSIS — K9403 Colostomy malfunction: Secondary | ICD-10-CM | POA: Insufficient documentation

## 2021-02-16 DIAGNOSIS — I10 Essential (primary) hypertension: Secondary | ICD-10-CM | POA: Insufficient documentation

## 2021-02-16 DIAGNOSIS — D759 Disease of blood and blood-forming organs, unspecified: Secondary | ICD-10-CM | POA: Diagnosis not present

## 2021-02-16 DIAGNOSIS — Z9889 Other specified postprocedural states: Secondary | ICD-10-CM | POA: Insufficient documentation

## 2021-02-16 DIAGNOSIS — Z79899 Other long term (current) drug therapy: Secondary | ICD-10-CM | POA: Insufficient documentation

## 2021-02-16 DIAGNOSIS — K9409 Other complications of colostomy: Secondary | ICD-10-CM | POA: Diagnosis not present

## 2021-02-16 DIAGNOSIS — D649 Anemia, unspecified: Secondary | ICD-10-CM | POA: Diagnosis not present

## 2021-02-16 SURGERY — EXAM UNDER ANESTHESIA
Anesthesia: General

## 2021-02-16 MED ORDER — DEXAMETHASONE SODIUM PHOSPHATE 10 MG/ML IJ SOLN
INTRAMUSCULAR | Status: AC
  Filled 2021-02-16: qty 1

## 2021-02-16 MED ORDER — FENTANYL CITRATE (PF) 100 MCG/2ML IJ SOLN
INTRAMUSCULAR | Status: AC
  Filled 2021-02-16: qty 2

## 2021-02-16 MED ORDER — SUGAMMADEX SODIUM 200 MG/2ML IV SOLN
INTRAVENOUS | Status: DC | PRN
  Administered 2021-02-16: 140 mg via INTRAVENOUS

## 2021-02-16 MED ORDER — LACTATED RINGERS IV SOLN: INTRAVENOUS | Status: DC

## 2021-02-16 MED ORDER — EPHEDRINE SULFATE-NACL 50-0.9 MG/10ML-% IV SOSY
PREFILLED_SYRINGE | INTRAVENOUS | Status: DC | PRN
  Administered 2021-02-16 (×2): 5 mg via INTRAVENOUS

## 2021-02-16 MED ORDER — ROCURONIUM BROMIDE 10 MG/ML (PF) SYRINGE
PREFILLED_SYRINGE | INTRAVENOUS | Status: DC | PRN
  Administered 2021-02-16: 40 mg via INTRAVENOUS

## 2021-02-16 MED ORDER — ONDANSETRON HCL 4 MG/2ML IJ SOLN
INTRAMUSCULAR | Status: DC | PRN
Start: 2021-02-16 — End: 2021-02-16
  Administered 2021-02-16: 4 mg via INTRAVENOUS

## 2021-02-16 MED ORDER — FENTANYL CITRATE (PF) 100 MCG/2ML IJ SOLN
INTRAMUSCULAR | Status: DC | PRN
  Administered 2021-02-16: 50 ug via INTRAVENOUS

## 2021-02-16 MED ORDER — LIDOCAINE 2% (20 MG/ML) 5 ML SYRINGE
INTRAMUSCULAR | Status: DC | PRN
  Administered 2021-02-16: 60 mg via INTRAVENOUS

## 2021-02-16 MED ORDER — ROCURONIUM BROMIDE 10 MG/ML (PF) SYRINGE
PREFILLED_SYRINGE | INTRAVENOUS | Status: AC
  Filled 2021-02-16: qty 10

## 2021-02-16 MED ORDER — MIDAZOLAM HCL 5 MG/5ML IJ SOLN
INTRAMUSCULAR | Status: DC | PRN
  Administered 2021-02-16: 2 mg via INTRAVENOUS

## 2021-02-16 MED ORDER — BUPIVACAINE LIPOSOME 1.3 % IJ SUSP
INTRAMUSCULAR | Status: DC | PRN
  Administered 2021-02-16: 20 mL

## 2021-02-16 MED ORDER — ONDANSETRON HCL 4 MG/2ML IJ SOLN
INTRAMUSCULAR | Status: AC
  Filled 2021-02-16: qty 2

## 2021-02-16 MED ORDER — DEXAMETHASONE SODIUM PHOSPHATE 10 MG/ML IJ SOLN
INTRAMUSCULAR | Status: DC | PRN
  Administered 2021-02-16: 6 mg via INTRAVENOUS

## 2021-02-16 MED ORDER — BUPIVACAINE-EPINEPHRINE (PF) 0.25% -1:200000 IJ SOLN
INTRAMUSCULAR | Status: DC | PRN
  Administered 2021-02-16: 20 mL via PERINEURAL

## 2021-02-16 MED ORDER — PROPOFOL 10 MG/ML IV BOLUS
INTRAVENOUS | Status: AC
  Filled 2021-02-16: qty 20

## 2021-02-16 MED ORDER — SODIUM CHLORIDE 0.9 % IV SOLN
2.0000 g | INTRAVENOUS | Status: AC
  Administered 2021-02-16: 2 g via INTRAVENOUS
  Filled 2021-02-16: qty 2

## 2021-02-16 MED ORDER — ORAL CARE MOUTH RINSE: 15.0000 mL | Freq: Once | OROMUCOSAL | Status: AC

## 2021-02-16 MED ORDER — BUPIVACAINE-EPINEPHRINE (PF) 0.25% -1:200000 IJ SOLN
INTRAMUSCULAR | Status: AC
  Filled 2021-02-16: qty 30

## 2021-02-16 MED ORDER — BUPIVACAINE LIPOSOME 1.3 % IJ SUSP: 20.0000 mL | Freq: Once | INTRAMUSCULAR | Status: DC

## 2021-02-16 MED ORDER — EPHEDRINE 5 MG/ML INJ
INTRAVENOUS | Status: AC
  Filled 2021-02-16: qty 5

## 2021-02-16 MED ORDER — BUPIVACAINE LIPOSOME 1.3 % IJ SUSP
INTRAMUSCULAR | Status: AC
  Filled 2021-02-16: qty 20

## 2021-02-16 MED ORDER — PROPOFOL 10 MG/ML IV BOLUS
INTRAVENOUS | Status: DC | PRN
  Administered 2021-02-16: 70 mg via INTRAVENOUS

## 2021-02-16 MED ORDER — MIDAZOLAM HCL 2 MG/2ML IJ SOLN
INTRAMUSCULAR | Status: AC
  Filled 2021-02-16: qty 2

## 2021-02-16 MED ORDER — ACETAMINOPHEN 500 MG PO TABS
1000.0000 mg | ORAL_TABLET | ORAL | Status: AC
  Administered 2021-02-16: 1000 mg via ORAL
  Filled 2021-02-16: qty 2

## 2021-02-16 MED ORDER — CHLORHEXIDINE GLUCONATE 0.12 % MT SOLN
15.0000 mL | Freq: Once | OROMUCOSAL | Status: AC
  Administered 2021-02-16: 15 mL via OROMUCOSAL

## 2021-02-16 SURGICAL SUPPLY — 25 items
BAG COUNTER SPONGE SURGICOUNT (BAG) IMPLANT
DRSG PAD ABDOMINAL 8X10 ST (GAUZE/BANDAGES/DRESSINGS) IMPLANT
ELECT REM PT RETURN 15FT ADLT (MISCELLANEOUS) ×2 IMPLANT
GAUZE SPONGE 4X4 12PLY STRL (GAUZE/BANDAGES/DRESSINGS) IMPLANT
GLOVE SURG ENC MOIS LTX SZ7.5 (GLOVE) ×2 IMPLANT
GLOVE SURG UNDER LTX SZ8 (GLOVE) ×2 IMPLANT
GOWN STRL REUS W/TWL XL LVL3 (GOWN DISPOSABLE) ×4 IMPLANT
KIT BASIN OR (CUSTOM PROCEDURE TRAY) ×2 IMPLANT
KIT TURNOVER KIT A (KITS) IMPLANT
NEEDLE HYPO 22GX1.5 SAFETY (NEEDLE) ×2 IMPLANT
PACK GENERAL/GYN (CUSTOM PROCEDURE TRAY) ×2 IMPLANT
PANTS MESH DISP LRG (UNDERPADS AND DIAPERS) ×1 IMPLANT
PANTS MESH DISPOSABLE L (UNDERPADS AND DIAPERS) ×1
SHEARS HARMONIC 9CM CVD (BLADE) IMPLANT
SPIKE FLUID TRANSFER (MISCELLANEOUS) ×2 IMPLANT
SPONGE SURGIFOAM ABS GEL 100 (HEMOSTASIS) IMPLANT
SURGILUBE 2OZ TUBE FLIPTOP (MISCELLANEOUS) ×2 IMPLANT
SUT CHROMIC 2 0 SH (SUTURE) IMPLANT
SUT CHROMIC 3 0 SH 27 (SUTURE) IMPLANT
SUT MNCRL AB 4-0 PS2 18 (SUTURE) ×2 IMPLANT
SUT VIC AB 3-0 SH 27 (SUTURE) ×1
SUT VIC AB 3-0 SH 27X BRD (SUTURE) ×1 IMPLANT
SYR 20ML LL LF (SYRINGE) ×2 IMPLANT
TOWEL OR 17X26 10 PK STRL BLUE (TOWEL DISPOSABLE) ×2 IMPLANT
TOWEL OR NON WOVEN STRL DISP B (DISPOSABLE) ×2 IMPLANT

## 2021-02-16 NOTE — H&P (Signed)
CC: Here today for surgery  HPI: Anne Aguirre is an 71 y.o. female who is here for surgery for colostomy status, colostomy stenosis  Patient underwent exploratory laparotomy, Hartman's colostomy on August 16 by one of my partners Dr. Rosendo Gros. This was secondary to perforated diverticulitis, feculent peritonitis.  She did have a readmission secondary to multiple small abscesses that were not amenable to drainage. She was treated with IV antibiotics.   Final pathology - diverticulitis with perforation, pericolic abscess and acute serositis. No malignancy.  She was following with one of my partners, Dr. Rosendo Gros. Her last colonoscopy was in 1999. She had a colostomy attempt with [facility] and they are able to evaluate her rectum but were unable to pass the scope through her actual ostomy due to some stenosis at the level of the skin. She was subsequently referred to see me. She reports her ostomy is been working well. She denies any issue with constipation or significant abdominal pain. She has developed what appears to be a large incisional hernia to the right of midline. She denies any significant pain associate with this but if she spends all day on her feet will notice that is larger and she will have to lay down at night to massage her back in place. No nausea or vomiting. Appetite has been good.  INTERVAL HX She denies any changes in her health or health hx since we met in the office. States she is ready for surgery today.   PMH: HTN  PSH: Exlap/Hartmann's 08/2020  FHx: Denies any known family history of colorectal, breast, endometrial or ovarian cancer  Social Hx: Denies use of tobacco/EtOH/illicit drug. She is here today with her husband  Past Medical History:  Diagnosis Date   Acute diverticulitis 08/23/2020   COVID-19 virus infection ---- +ve on 09/01/2020 09/10/2020   Essential hypertension, benign 10/01/2018   HLD (hyperlipidemia) 10/01/2018   Osteoarthritis 10/01/2018   Post-op  intraabdominal/Liver abscess-Had Hartmann's with Colostomy on 08/29/2020 for Sigmoid Diverticulits 09/10/2020   s/p ex lap and Hartman's procedure with colostomy by [facility] 08/29/20 for diverticulitis with abscess-- Now with Liver/intrabdominal abscess---   Sepsis Due to Intraabdomonal Abscess 09/10/2020   Vitamin D deficiency disease 10/01/2018    Past Surgical History:  Procedure Laterality Date   COLON RESECTION N/A 08/29/2020   Procedure: COLON RESECTION;  Surgeon: Ralene Ok, MD;  Location: [place];  Service: General;  Laterality: N/A;   COLOSTOMY N/A 08/29/2020   Procedure: COLOSTOMY;  Surgeon: Ralene Ok, MD;  Location: [place];  Service: General;  Laterality: N/A;   LAPAROTOMY N/A 08/29/2020   Procedure: EXPLORATORY LAPAROTOMY;  Surgeon: Ralene Ok, MD;  Location: [facility] OR;  Service: General;  Laterality: N/A;   TONSILLECTOMY Bilateral     Family History  Problem Relation Age of Onset   Hypertension Mother    Stroke Mother    Stroke Father    Hypertension Brother    Hypertension Son     Social:  reports that she has never smoked. She has never used smokeless tobacco. She reports that she does not currently use alcohol after a past usage of about 3.0 standard drinks per week. She reports that she does not use drugs.  Allergies: No Known Allergies  Medications: I have reviewed the patient's current medications.  No results found for this or any previous visit (from the past 48 hour(s)).  No results found.  ROS - all of the below systems have been reviewed with the patient and positives are  indicated with bold text General: chills, fever or night sweats Eyes: blurry vision or double vision ENT: epistaxis or sore throat Allergy/Immunology: itchy/watery eyes or nasal congestion Hematologic/Lymphatic: bleeding problems, blood clots or swollen lymph nodes Endocrine: temperature intolerance or unexpected weight changes Breast: new or changing breast lumps or nipple  discharge Resp: cough, shortness of breath, or wheezing CV: chest pain or dyspnea on exertion GI: as per HPI GU: dysuria, trouble voiding, or hematuria MSK: joint pain or joint stiffness Neuro: TIA or stroke symptoms Derm: pruritus and skin lesion changes Psych: anxiety and depression  PE There were no vitals taken for this visit. Constitutional: NAD; conversant; wearing mask Eyes: Moist conjunctiva; no lid lag; anicteric Lungs: Normal respiratory effort CV: RRR; no palpable thrills; no pitting edema GI: Abd soft, ND; no palpable hepatosplenomegaly MSK: Normal range of motion of extremities; no clubbing/cyanosis Psychiatric: Appropriate affect; alert and oriented x3  No results found for this or any previous visit (from the past 48 hour(s)).  No results found.  A/P: Anne Aguirre is an 71 y.o. female with hx of HTN here for evaluation of colostomy stenosis in the setting of a descending colostomy following a Hartman's procedure 08/2020 for perforated diverticulitis by Dr. Rosendo Gros.  -Referral to [place] enema -Colonoscopy pending stomal revision -Given the stomal stenosis and GI's inability to pass a scope, we discussed options going forward. Given the interval since her last colonoscopy, 1999, I do think it would be a good idea to have this completed prior to any sort of ostomy takedown surgery. We therefore discussed proceeding with colostomy revision, exam under anesthesia.  -The planned procedure, material risks (including, but not limited to, pain, bleeding, infection, scarring, need for blood transfusion, recurrence of stenosis, need for additional procedures, worsening of pre-existing medical conditions, pneumonia, heart attack, stroke, death) benefits and alternatives to surgery were discussed at length. The patient's questions were answered to her satisfaction, she voiced understanding and elected to proceed with surgery. Additionally, we discussed typical  postoperative expectations and the recovery process. -Following this, should be able to have her colonoscopy completed. -May require some degree of maintenance with dilation  Nadeen Landau, MD [facility], [place]

## 2021-02-16 NOTE — Anesthesia Procedure Notes (Signed)

## 2021-02-16 NOTE — Anesthesia Postprocedure Evaluation (Signed)
Anesthesia Post Note  Patient: Anne Aguirre  Procedure(s) Performed: REVISION OF COLOSTOMY STENOSIS, EXAM UNDER ANESTHESIA     Patient location during evaluation: PACU Anesthesia Type: General Level of consciousness: sedated and patient cooperative Pain management: pain level controlled Vital Signs Assessment: post-procedure vital signs reviewed and stable Respiratory status: spontaneous breathing Cardiovascular status: stable Anesthetic complications: no   No notable events documented.  Last Vitals:  Vitals:   02/16/21 1400 02/16/21 1415  BP: 115/65 116/76  Pulse: 68 77  Resp: 11 20  Temp:  36.4 C  SpO2: 98% 97%    Last Pain:  Vitals:   02/16/21 1415  TempSrc:   PainSc: 0-No pain                 Lewie Loron

## 2021-02-16 NOTE — Discharge Instructions (Addendum)
POST OP INSTRUCTIONS  DIET: As tolerated.  Take your usually prescribed home medications unless otherwise directed.  PAIN CONTROL: Pain is best controlled by a usual combination of three different methods TOGETHER: Ice/Heat Over the counter pain medication Prescription pain medication Most patients will experience some swelling and bruising around the surgical site and this is normal It is helpful to take an over-the-counter pain medication regularly for the first few weeks: Ibuprofen (Motrin/Advil) - 200mg  tabs - take 3 tabs (600mg ) every 6 hours as needed for pain Acetaminophen (Tylenol) - you may take 650mg  every 6 hours as needed. You can take this with motrin as they act differently on the body. If you are taking a narcotic pain medication that has acetaminophen in it, do not take over the counter tylenol at the same time.  Iii. NOTE: You may take both of these medications together - most patients  find it most helpful when alternating between the two (i.e. Ibuprofen at 6am, tylenol at 9am, ibuprofen at 12pm ..Marland Kitchen)  Avoid getting constipated.  Between the surgery and the pain medications, it is common to experience some constipation.  Increasing fluid intake and taking a fiber supplement (such as Metamucil, Citrucel, FiberCon, MiraLax, etc) 1-2 times a day regularly will usually help prevent this problem from occurring.  A mild laxative (prune juice, Milk of Magnesia, MiraLax, etc) should be taken according to package directions if there are no bowel movements after 48 hours.    Dressing: Cut you appliance to fit - it will likely need to be cut larger now.  ACTIVITIES as tolerated:   You may resume regular (light) daily activities beginning the next day--such as daily self-care, walking, climbing stairs--gradually increasing activities as tolerated.  If you can walk 30 minutes without difficulty, it is safe to try more intense activity such as jogging, treadmill, bicycling, low-impact  aerobics.  DO NOT PUSH THROUGH PAIN.  Let pain be your guide: If it hurts to do something, don't do it. You may drive when you are not taking prescription pain medication, you can comfortably wear a seatbelt, and you can safely maneuver your car and apply brakes but no sooner than 24 hours from your surgery   FOLLOW UP in our office Please call [facility] at [phone] to set up an appointment to see your surgeon in the office for a follow-up appointment approximately 2 weeks after your surgery. Make sure that you call for this appointment the day you arrive home to insure a convenient appointment time.  9. If you have disability or family leave forms that need to be completed, you may have them completed by your primary care physician's office; for return to work instructions, please ask our office staff and they will be happy to assist you in obtaining this documentation   When to call us [phone]: Poor pain control Reactions / problems with new medications (rash/itching, etc)  Fever over 101.5 F (38.5 C) Inability to urinate Nausea/vomiting Worsening swelling or bruising Continued bleeding from incision. Increased pain, redness, or drainage from the incision  The clinic staff is available to answer your questions during regular business hours (8:30am-5pm).  Please dont hesitate to call and ask to speak to one of our nurses for clinical concerns.   A surgeon from [facility] is always on call at the hospitals   If you have a medical emergency, go to the nearest emergency room or call 911.  [facility] [place] [address]  [address], [place], [place], [place]  [address] MAIN: [phone] FAX: [phone] [email or web address]

## 2021-02-16 NOTE — Anesthesia Preprocedure Evaluation (Signed)
Anesthesia Evaluation  Patient identified by MRN, date of birth, ID band Patient awake    Reviewed: Allergy & Precautions, NPO status , Patient's Chart, lab work & pertinent test results  History of Anesthesia Complications Negative for: history of anesthetic complications  Airway Mallampati: II  TM Distance: >3 FB Neck ROM: Full    Dental  (+) Dental Advisory Given, Teeth Intact   Pulmonary neg pulmonary ROS,    Pulmonary exam normal breath sounds clear to auscultation       Cardiovascular hypertension, Pt. on medications and Pt. on home beta blockers Normal cardiovascular exam Rhythm:Regular Rate:Normal     Neuro/Psych negative neurological ROS  negative psych ROS   GI/Hepatic negative GI ROS, Neg liver ROS,   Endo/Other  negative endocrine ROS Na 129 K 3.2 Ca 6.9 Mg 1.4 P 2.3   Renal/GU Renal InsufficiencyRenal disease  negative genitourinary   Musculoskeletal negative musculoskeletal ROS (+) Arthritis , Osteoarthritis,    Abdominal   Peds  Hematology  (+) Blood dyscrasia, anemia ,   Anesthesia Other Findings   Reproductive/Obstetrics negative OB ROS                            Anesthesia Physical  Anesthesia Plan  ASA: 2  Anesthesia Plan: General   Post-op Pain Management: Minimal or no pain anticipated and Tylenol PO (pre-op)   Induction: Intravenous  PONV Risk Score and Plan: 4 or greater and Treatment may vary due to age or medical condition, Ondansetron, Dexamethasone, Propofol infusion and Midazolam  Airway Management Planned: Oral ETT  Additional Equipment: None  Intra-op Plan:   Post-operative Plan: Extubation in OR  Informed Consent: I have reviewed the patients History and Physical, chart, labs and discussed the procedure including the risks, benefits and alternatives for the proposed anesthesia with the patient or authorized representative who has  indicated his/her understanding and acceptance.     Dental advisory given  Plan Discussed with: CRNA  Anesthesia Plan Comments:        Anesthesia Quick Evaluation

## 2021-02-16 NOTE — Transfer of Care (Signed)
Immediate Anesthesia Transfer of Care Note  Patient: Anne Aguirre  Procedure(s) Performed: REVISION OF COLOSTOMY STENOSIS, EXAM UNDER ANESTHESIA  Patient Location: PACU  Anesthesia Type:General  Level of Consciousness: awake, alert , oriented and patient cooperative  Airway & Oxygen Therapy: Patient Spontanous Breathing and Patient connected to face mask oxygen  Post-op Assessment: Report given to RN, Post -op Vital signs reviewed and stable and Patient moving all extremities  Post vital signs: Reviewed and stable  Last Vitals:  Vitals Value Taken Time  BP 118/64 02/16/21 1323  Temp    Pulse 77 02/16/21 1325  Resp 25 02/16/21 1325  SpO2 100 % 02/16/21 1325  Vitals shown include unvalidated device data.  Last Pain:  Vitals:   02/16/21 1139  TempSrc:   PainSc: 0-No pain      Patients Stated Pain Goal: 3 (02/16/21 1139)  Complications: No notable events documented.

## 2021-02-16 NOTE — Op Note (Signed)
02/16/2021  1:23 PM  PATIENT:  Anne Aguirre  71 y.o. female  Patient Care Team: Anabel Halon, MD as PCP - General (Internal Medicine)  PRE-OPERATIVE DIAGNOSIS:  Colostomy stenosis  POST-OPERATIVE DIAGNOSIS:  Same  PROCEDURE:   Revision of end sigmoid colostomy with examination under anesthesia  SURGEON:  Stephanie Coup. Ronav Furney, MD  ASSISTANT: OR staff  ANESTHESIA:   local and general  COUNTS:  All sponge, needle and instrument counts were reported correct at the conclusion of the operation.  EBL: 5 mL  DRAINS: None  SPECIMEN: None  COMPLICATIONS: None  FINDINGS: Dense stenosis of colostomy at the level of the dermis. Viable healthy colonic tissue, appropriately perfused. Entire involved skin and associated colostomy stenosis was excised at this level. The colostomy easily accommodated an 18 Hegar dilator. This was then re-matured.  DISPOSITION: PACU in satisfactory condition  DESCRIPTION: The patient was identified in preop holding and taken to the OR where she was placed on the operating room table. SCDs were placed. General endotracheal anesthesia was induced without difficulty. Pressure points were evaluated and padded. She was then prepped and draped in the usual sterile fashion. A surgical timeout was performed indicating the correct patient, procedure, positioning and need for preoperative antibiotics.   Under anesthesia, the colostomy site was examined.  A size 4/5 Hegar dilator passed with difficulty.  We were not able to insert a digit into the colostomy at this level.  Local anesthetic consisting of 0.25% Marcaine and Exparel was infiltrated into the tissue around the colostomy site.  The skin was circumferentially incised.  The stricture is relatively superficial involving skin and dermis.  The associated colon underneath is pink and well perfused in appearance.  The colostomy is mobilized circumferentially working between the colon and associated mesocolon and the  subcutaneous fat.  We intentionally did not mobilize it all the way to the fascia to leave this in place as an anchor.  After freeing it from the surrounding subcutaneous tissue, the colostomy was evaluated.  The most distal end of it is stenotic.  This was excised.  Again, the colon underneath is pink and bleeds nicely.  The skin is excised circumferentially to accommodate a new colostomy of a wider caliber.  This is then rematured to the skin using interrupted 3-0 Vicryl sutures.  At this juncture, an 18 Hegar dilator passed without any difficulty.  I am able to palpate the colon down to the level and just beneath the fascia and it is widely patent.  All counts are reported correct.  An ostomy appliance was cut to fit and applied.  She is then awakened from anesthesia, extubated, and transferred to a stretcher for transport to PACU in satisfactory condition.

## 2021-02-19 ENCOUNTER — Other Ambulatory Visit (HOSPITAL_COMMUNITY): Payer: Medicare PPO

## 2021-02-26 ENCOUNTER — Other Ambulatory Visit: Payer: Self-pay

## 2021-02-26 ENCOUNTER — Ambulatory Visit (HOSPITAL_COMMUNITY)
Admission: RE | Admit: 2021-02-26 | Discharge: 2021-02-26 | Disposition: A | Discharge status: Home / Self Care | Payer: Medicare PPO | Source: Ambulatory Visit | Attending: Surgery | Admitting: Surgery

## 2021-02-26 ENCOUNTER — Other Ambulatory Visit (HOSPITAL_COMMUNITY): Payer: Medicare PPO

## 2021-02-26 DIAGNOSIS — Z933 Colostomy status: Secondary | ICD-10-CM | POA: Insufficient documentation

## 2021-02-26 DIAGNOSIS — Z0389 Encounter for observation for other suspected diseases and conditions ruled out: Secondary | ICD-10-CM | POA: Diagnosis not present

## 2021-02-26 MED ORDER — IOHEXOL 300 MG/ML  SOLN: 300.0000 mL | Freq: Once | INTRAMUSCULAR | Status: DC | PRN

## 2021-02-27 ENCOUNTER — Encounter (HOSPITAL_COMMUNITY)
Admission: RE | Admit: 2021-02-27 | Discharge: 2021-02-27 | Disposition: A | Discharge status: Home / Self Care | Payer: Medicare PPO | Source: Ambulatory Visit | Attending: Plastic Surgery | Admitting: Plastic Surgery

## 2021-02-27 DIAGNOSIS — Y838 Other surgical procedures as the cause of abnormal reaction of the patient, or of later complication, without mention of misadventure at the time of the procedure: Secondary | ICD-10-CM | POA: Insufficient documentation

## 2021-02-27 DIAGNOSIS — K9400 Colostomy complication, unspecified: Secondary | ICD-10-CM | POA: Diagnosis not present

## 2021-02-27 DIAGNOSIS — K9409 Other complications of colostomy: Secondary | ICD-10-CM | POA: Diagnosis not present

## 2021-02-27 NOTE — Progress Notes (Signed)
[facility]   Reason for visit:  Dilation of stenosed colostomy. Unable to complete colonoscopy.  S/P revision of colosotmy HPI:  Stenosed colostomy revised ROS  Review of Systems Vital signs:  BP (!) 144/83    Pulse 81    Temp 98.3 F (36.8 C) (Oral)    Resp 18    SpO2 99%  Exam:  Physical Exam  Stoma type/location:  LLQ colostomy, well budded Stomal assessment/size:  1 1/4", flush along bottom.  Adding barrier ring.  States she does not have any at home, will send home with adequate supply.  Peristomal assessment:  intact, but stool could leak at flush segment Treatment options for stomal/peristomal skin: barrier ring and 2 3/4" 2 piece pouch Output: soft brown stool Ostomy pouching: 2pc. Pouch with barrier ring  Education provided:  Will see back as needed.  She is getting colonoscopy finished at [facility] as soon as possible.     Impression/dx  Colostomy complication Discussion  Adding barrier ring Plan  Follow up with colonoscopy as ordered.     Visit time: 45 minutes.   Maple Hudson FNP-BC

## 2021-02-28 NOTE — Discharge Instructions (Signed)
Adding barrier ring Have provided enough to last until 3/11 when able to get more.

## 2021-03-19 ENCOUNTER — Encounter: Payer: Self-pay | Admitting: Orthopedic Surgery

## 2021-03-19 ENCOUNTER — Ambulatory Visit: Payer: Medicare PPO

## 2021-03-19 ENCOUNTER — Other Ambulatory Visit: Payer: Self-pay

## 2021-03-19 ENCOUNTER — Other Ambulatory Visit: Payer: Self-pay | Admitting: Urology

## 2021-03-19 ENCOUNTER — Ambulatory Visit: Payer: Medicare PPO | Admitting: Orthopedic Surgery

## 2021-03-19 VITALS — BP 150/81 | HR 78 | Ht 65.0 in | Wt 126.6 lb

## 2021-03-19 DIAGNOSIS — M4316 Spondylolisthesis, lumbar region: Secondary | ICD-10-CM | POA: Diagnosis not present

## 2021-03-19 DIAGNOSIS — M545 Low back pain, unspecified: Secondary | ICD-10-CM

## 2021-03-19 DIAGNOSIS — M1612 Unilateral primary osteoarthritis, left hip: Secondary | ICD-10-CM

## 2021-03-19 DIAGNOSIS — M4186 Other forms of scoliosis, lumbar region: Secondary | ICD-10-CM

## 2021-03-19 DIAGNOSIS — M25552 Pain in left hip: Secondary | ICD-10-CM

## 2021-03-19 MED ORDER — TIZANIDINE HCL 4 MG PO TABS: 4.0000 mg | ORAL_TABLET | Freq: Three times a day (TID) | ORAL | 1 refills | Status: DC | PRN

## 2021-03-19 MED ORDER — GABAPENTIN 100 MG PO CAPS: 100.0000 mg | ORAL_CAPSULE | Freq: Three times a day (TID) | ORAL | 2 refills | Status: DC | PRN

## 2021-03-19 NOTE — Patient Instructions (Signed)
Recommend physical therapy ? ?Heat 3 times a day for 30 minutes ? ?Activity modification avoid painful activities ? ?You can take 500 mg of Tylenol every 6 hours ? ?Ibuprofen 800 mg every 8 hours as needed ? ?I ordered a muscle relaxer tizanidine 4 mg every 8 as needed and Neurontin 100 mg every 8 as needed ? ?You have an MRI of your lumbar spine ? ?I will call you with the results and then we will make a referral to Dr. Lorin Mercy for further care ?

## 2021-03-19 NOTE — Progress Notes (Signed)
NEW PROBLEM//OFFICE VISIT ? ? ?Chief Complaint  ?Patient presents with  ? Back Pain  ?  LBP/ x-ray showed dextro-lumbar?  ? ?[name] is 71 years old she presents to [place] with a 10 to 15-year history of lower back pain.  She was scheduled for an MRI 10 or 15 years ago it was denied by insurance so she dealt with it with Tylenol and Salonpas patches ? ?She has had some recent health issues where she had to have an ostomy placed and that is scheduled for reconnection in April and she also has a large hernia which causes her to lean forward which stresses her back.  She wears a belt for that which has helped the back. ? ?Sometime in August of last year she started having pain radiating down her left leg after admission to the hospital.  Her leg is weak and causes her to limp ? ?She does not feel numbness on the left side just pain radiating to the foot from the lower back and most of the pain is on the left side ? ?It is hard for her to do her activities of daily living and it is hard for her to sleep ? ? ? ? ? ?ROS: No evidence of saddle anesthesia, weakness noted left leg no rash or unintended weight loss ? ?BP (!) 150/81   Pulse 78   Ht 5\' 5"  (1.651 m)   Wt 126 lb 9.6 oz (57.4 kg)   BMI 21.07 kg/m?  ? ?Body mass index is 21.07 kg/m?. ? ?General appearance: Well-developed well-nourished no gross deformities ? ?Cardiovascular normal pulse and perfusion normal color without edema ? ?Neurologically no sensation loss or deficits or pathologic reflexes ? ?Psychological: Awake alert and oriented x3 mood and affect normal ? ?Skin no lacerations or ulcerations no nodularity no palpable masses, no erythema or nodularity ? ?Musculoskeletal: ? ?Lumbar spine is nontender in the midline and right side but tender on the left lower back.  She also has positive straight leg raise at 30 degrees with lower back pain and radicular pain radiating to the left foot ? ?Right hip range of motion normal with good strength ? ?Left hip  mild hip flexion weakness some groin pain is noted with flexion internal rotation but it is mild she gets some lower back pain with this as well ? ?Remaining motor exam was normal bilaterally pinwheel test was normal for L4 and L5 nerves reflexes were 2+ and equal at the knee and 1+ at the ankle bilaterally ? ?There was an x-ray of her pelvis done while she was having a barium enema I believe and it showed some abnormalities of the spine but we will take dedicated lumbar spine and left hip films as there was some abnormality seen on the left hip as well ? ? ?Past Medical History:  ?Diagnosis Date  ? Acute diverticulitis 08/23/2020  ? COVID-19 virus infection ---- +ve on 09/01/2020 09/10/2020  ? Essential hypertension, benign 10/01/2018  ? HLD (hyperlipidemia) 10/01/2018  ? Osteoarthritis 10/01/2018  ? Post-op intraabdominal/Liver abscess-Had Hartmann's with Colostomy on 08/29/2020 for Sigmoid Diverticulits 09/10/2020  ? s/p ex lap and Hartman's procedure with colostomy by [facility] 08/29/20 for diverticulitis with abscess-- Now with Liver/intrabdominal abscess---  ? Sepsis Due to Intraabdomonal Abscess 09/10/2020  ? Vitamin D deficiency disease 10/01/2018  ? ? ?Past Surgical History:  ?Procedure Laterality Date  ? COLON RESECTION N/A 08/29/2020  ? Procedure: COLON RESECTION;  Surgeon: Ralene Ok, MD;  Location: [facility];  Service: General;  Laterality: N/A;  ? COLOSTOMY N/A 08/29/2020  ? Procedure: COLOSTOMY;  Surgeon: Ralene Ok, MD;  Location: [facility];  Service: General;  Laterality: N/A;  ? LAPAROTOMY N/A 08/29/2020  ? Procedure: EXPLORATORY LAPAROTOMY;  Surgeon: Ralene Ok, MD;  Location: [facility];  Service: General;  Laterality: N/A;  ? TONSILLECTOMY Bilateral   ? ? ?Family History  ?Problem Relation Age of Onset  ? Hypertension Mother   ? Stroke Mother   ? Stroke Father   ? Hypertension Brother   ? Hypertension Son   ? ?Social History  ? ?Tobacco Use  ? Smoking status: Never  ? Smokeless tobacco: Never  ?Vaping Use   ? Vaping Use: Never used  ?Substance Use Topics  ? Alcohol use: Not Currently  ?  Alcohol/week: 3.0 standard drinks  ?  Types: 3 Glasses of wine per week  ?  Comment: occasional wine  ? Drug use: Never  ? ? ?No Known Allergies ? ?Current Meds  ?Medication Sig  ? amLODipine (NORVASC) 10 MG tablet Take 1 tablet (10 mg total) by mouth daily.  ? Camphor-Menthol-Methyl Sal (SALONPAS) 3.01-20-08 % PTCH Place 1 patch onto the skin daily as needed (pain).  ? gabapentin (NEURONTIN) 100 MG capsule Take 1 capsule (100 mg total) by mouth every 8 (eight) hours as needed.  ? Multiple Vitamin (MULTIVITAMIN WITH MINERALS) TABS tablet Take 1 tablet by mouth daily.  ? Nebivolol HCl 20 MG TABS Take 1 tablet (20 mg total) by mouth daily at 12 noon.  ? tiZANidine (ZANAFLEX) 4 MG tablet Take 1 tablet (4 mg total) by mouth every 8 (eight) hours as needed for muscle spasms.  ? ? ? ?MEDICAL DECISION MAKING ? ?A.  ?Encounter Diagnoses  ?Name Primary?  ? Pain in left hip Yes  ? Low back pain, unspecified back pain laterality, unspecified chronicity, unspecified whether sciatica present   ? ? ?B. DATA ANALYSED: ? ? ?IMAGING: ?Interpretation of images: I have personally reviewed the images and my interpretation is internal imaging lumbar spine scoliosis lumbar spine moderate disc space narrowing L1-S1 spondylolisthesis grade 1 L3 on 4 ? ?Orders: Recommend MRI lumbar spine ? ?Outside records reviewed: None ? ? ?C. MANAGEMENT  ? ?71 year old female 15-year history of back pain recent onset of radicular symptoms left leg since August ? ?Recommend physical therapy ? ?Heat 3 times a day for 30 minutes ? ?Activity modification ? ?She can take Tylenol 500 mg every 6 hours for pain add ? ?Ibuprofen 800 mg every 8 as needed as well as the following prescriptions ? ?We decided not to use any prednisone as she has an upcoming reattachment of an ostomy and we do not want to interfere with the healing process ? ?Meds ordered this encounter  ?Medications  ?  tiZANidine (ZANAFLEX) 4 MG tablet  ?  Sig: Take 1 tablet (4 mg total) by mouth every 8 (eight) hours as needed for muscle spasms.  ?  Dispense:  60 tablet  ?  Refill:  1  ? gabapentin (NEURONTIN) 100 MG capsule  ?  Sig: Take 1 capsule (100 mg total) by mouth every 8 (eight) hours as needed.  ?  Dispense:  90 capsule  ?  Refill:  2  ? ?MRI will be ordered and I will read it call her with the results and then refer her to a back specialist for further treatment ? ?Arther Abbott, MD ? ?03/19/2021 ?12:39 PM ?

## 2021-03-27 DIAGNOSIS — K5732 Diverticulitis of large intestine without perforation or abscess without bleeding: Secondary | ICD-10-CM | POA: Diagnosis not present

## 2021-03-27 LAB — HM COLONOSCOPY

## 2021-04-03 ENCOUNTER — Ambulatory Visit (HOSPITAL_COMMUNITY)
Admission: RE | Admit: 2021-04-03 | Discharge: 2021-04-03 | Disposition: A | Discharge status: Home / Self Care | Payer: Medicare PPO | Source: Ambulatory Visit | Attending: Orthopedic Surgery | Admitting: Orthopedic Surgery

## 2021-04-03 ENCOUNTER — Other Ambulatory Visit: Payer: Self-pay

## 2021-04-03 DIAGNOSIS — M4316 Spondylolisthesis, lumbar region: Secondary | ICD-10-CM | POA: Diagnosis not present

## 2021-04-03 DIAGNOSIS — M545 Low back pain, unspecified: Secondary | ICD-10-CM | POA: Insufficient documentation

## 2021-04-03 DIAGNOSIS — M4726 Other spondylosis with radiculopathy, lumbar region: Secondary | ICD-10-CM | POA: Diagnosis not present

## 2021-04-04 ENCOUNTER — Other Ambulatory Visit (HOSPITAL_COMMUNITY): Payer: Self-pay

## 2021-04-06 ENCOUNTER — Telehealth: Payer: Self-pay | Admitting: Orthopedic Surgery

## 2021-04-06 NOTE — Patient Instructions (Addendum)
DUE TO COVID-19 ONLY ONE VISITOR  (aged 69 and older)  IS ALLOWED TO COME WITH YOU AND STAY IN THE WAITING ROOM ONLY DURING PRE OP AND PROCEDURE.   ?**NO VISITORS ARE ALLOWED IN THE SHORT STAY AREA OR RECOVERY ROOM!!** ? ?IF YOU WILL BE ADMITTED INTO THE HOSPITAL YOU ARE ALLOWED ONLY TWO SUPPORT PEOPLE DURING VISITATION HOURS ONLY (7 AM -8PM)   ?The support person(s) must pass our screening, gel in and out, and wear a mask at all times, including in the patient?s room. ?Patients must also wear a mask when staff or their support person are in the room. ?Visitors GUEST BADGE MUST BE WORN VISIBLY  ?One adult visitor may remain with you overnight and MUST be in the room by 8 P.M. ?  ? ? Your procedure is scheduled on: 04/25/21 ? ? Report to [facility] Main Entrance ? ?  Report to admitting at 6:45 AM ? ? Call this number if you have problems the morning of surgery [phone] ? ? Follow clear liquid diet the day before surgery ? ? You may have the following liquids until 6:00 AM DAY OF SURGERY ? ?Water ?Black Coffee (sugar ok, NO MILK/CREAM OR CREAMERS)  ?Tea (sugar ok, NO MILK/CREAM OR CREAMERS) regular and decaf                             ?Plain Jell-O (NO RED)                                           ?Fruit ices (not with fruit pulp, NO RED)                                     ?Popsicles (NO RED)                                                                  ?Juice: apple, WHITE grape, WHITE cranberry ?Sports drinks like Gatorade (NO RED) ?Clear broth(vegetable,chicken,beef) ? ?FOLLOW BOWEL PREP AND ANY ADDITIONAL PRE OP INSTRUCTIONS YOU RECEIVED FROM YOUR SURGEON'S OFFICE!!! ?  ?  ?Oral Hygiene is also important to reduce your risk of infection.                                    ?Remember - BRUSH YOUR TEETH THE MORNING OF SURGERY WITH YOUR REGULAR TOOTHPASTE ? ? Take these medicines the morning of surgery with A SIP OF WATER: Tylenol, Amlodipine, Gabapentin, Nebivolol.  ?                  ?            You may not have any metal on your body including hair pins, jewelry, and body piercing ? ?           Do not wear make-up, lotions, powders, perfumes, or deodorant ? ?Do not wear nail polish including gel and S&S, artificial/acrylic nails, or any other  type of covering on natural nails including finger and toenails. If you have artificial nails, gel coating, etc. that needs to be removed by a nail salon please have this removed prior to surgery or surgery may need to be canceled/ delayed if the surgeon/ anesthesia feels like they are unable to be safely monitored.  ? ?Do not shave  48 hours prior to surgery.  ? ? Do not bring valuables to the hospital. [name] IS NOT ?            RESPONSIBLE   FOR VALUABLES. ? ? Bring small overnight bag day of surgery. ?  ? Special Instructions: Bring a copy of your healthcare power of attorney and living will documents         the day of surgery if you haven't scanned them before. ? ?            Please read over the following fact sheets you were given: IF YOU HAVE QUESTIONS ABOUT YOUR PRE-OP INSTRUCTIONS PLEASE CALL [phone]- Anne Aguirre ? ?   Anne Aguirre - Preparing for Surgery ?Before surgery, you can play an important role.  Because skin is not sterile, your skin needs to be as free of germs as possible.  You can reduce the number of germs on your skin by washing with CHG (chlorahexidine gluconate) soap before surgery.  CHG is an antiseptic cleaner which kills germs and bonds with the skin to continue killing germs even after washing. ?Please DO NOT use if you have an allergy to CHG or antibacterial soaps.  If your skin becomes reddened/irritated stop using the CHG and inform your nurse when you arrive at Short Stay. ?Do not shave (including legs and underarms) for at least 48 hours prior to the first CHG shower.  You may shave your face/neck. ? ?Please follow these instructions carefully: ? 1.  Shower with CHG Soap the night before surgery and the  morning of surgery. ? 2.   If you choose to wash your hair, wash your hair first as usual with your normal  shampoo. ? 3.  After you shampoo, rinse your hair and body thoroughly to remove the shampoo.                            ? 4.  Use CHG as you would any other liquid soap.  You can apply chg directly to the skin and wash.  Gently with a scrungie or clean washcloth. ? 5.  Apply the CHG Soap to your body ONLY FROM THE NECK DOWN.   Do   not use on face/ open      ?                     Wound or open sores. Avoid contact with eyes, ears mouth and   genitals (private parts).  ?                     Engineering geologist,  Genitals (private parts) with your normal soap. ?            6.  Wash thoroughly, paying special attention to the area where your    surgery  will be performed. ? 7.  Thoroughly rinse your body with warm water from the neck down. ? 8.  DO NOT shower/wash with your normal soap after using and rinsing off the CHG Soap. ?  9.  Pat yourself dry with a clean towel. ?           10.  Wear clean pajamas. ?           11.  Place clean sheets on your bed the night of your first shower and do not  sleep with pets. ?Day of Surgery : ?Do not apply any lotions/deodorants the morning of surgery.  Please wear clean clothes to the hospital/surgery Agu[facility]. ? ?FAILURE TO FOLLOW THESE INSTRUCTIONS MAY RESULT IN THE CANCELLATION OF YOUR SURGERY ? ?PATIENT SIGNATURE_________________________________ ? ?NURSE SIGNATURE__________________________________ ? ?________________________________________________________________________  ?

## 2021-04-06 NOTE — Telephone Encounter (Signed)
LEFT HIP ARTHRITIS  ? ?NEEDS THA FOR HIP PAIN GROIN  ? ?LUMBAR SPINE DISEASE NOT SYMPTOMATIC AT THIS TIME  ?

## 2021-04-06 NOTE — Progress Notes (Addendum)
COVID Vaccine Completed: yes x3 ?Date COVID Vaccine completed: 02/19/19, 03/12/19 ?Has received booster: 10/20/19 ?COVID vaccine manufacturer: Pfizer     ? ?Date of COVID positive in last 90 days: no ? ?PCP - Trena Platt, MD ?Cardiologist - n/a ? ?Chest x-ray - 09/10/20 Epic ?EKG - 09/11/20 Epic ?Stress Test - n/a ?ECHO - yes 2012 per pt ?Cardiac Cath - n/a ?Pacemaker/ICD device last checked: n/a ?Spinal Cord Stimulator: n/a ? ?Bowel Prep - clears day before ? ?Sleep Study - n/a ?CPAP -  ? ?Fasting Blood Sugar - n/a ?Checks Blood Sugar _____ times a day ? ?Blood Thinner Instructions: n/a ?Aspirin Instructions: ?Last Dose: ? ?Activity level: Can go up a flight of stairs and perform activities of daily living without stopping and without symptoms of chest pain or shortness of breath.   ? ?Anesthesia review: HTN, thrombocytopenia, Sepsis ? ?Patient denies shortness of breath, fever, cough and chest pain at PAT appointment ? ? ?Patient verbalized understanding of instructions that were given to them at the PAT appointment. Patient was also instructed that they will need to review over the PAT instructions again at home before surgery.  ?

## 2021-04-07 ENCOUNTER — Other Ambulatory Visit (HOSPITAL_BASED_OUTPATIENT_CLINIC_OR_DEPARTMENT_OTHER): Payer: Self-pay | Admitting: Family Medicine

## 2021-04-07 DIAGNOSIS — I10 Essential (primary) hypertension: Secondary | ICD-10-CM

## 2021-04-09 ENCOUNTER — Encounter (HOSPITAL_COMMUNITY)
Admission: RE | Admit: 2021-04-09 | Discharge: 2021-04-09 | Disposition: A | Discharge status: Home / Self Care | Payer: Medicare PPO | Source: Ambulatory Visit | Attending: Surgery | Admitting: Surgery

## 2021-04-09 ENCOUNTER — Other Ambulatory Visit: Payer: Self-pay

## 2021-04-09 ENCOUNTER — Encounter (HOSPITAL_COMMUNITY): Payer: Self-pay

## 2021-04-09 ENCOUNTER — Other Ambulatory Visit (HOSPITAL_BASED_OUTPATIENT_CLINIC_OR_DEPARTMENT_OTHER): Payer: Self-pay | Admitting: Family Medicine

## 2021-04-09 ENCOUNTER — Other Ambulatory Visit: Payer: Self-pay | Admitting: Internal Medicine

## 2021-04-09 VITALS — BP 106/65 | HR 65 | Temp 98.3°F | Resp 16 | Ht 65.0 in | Wt 125.0 lb

## 2021-04-09 DIAGNOSIS — Z01812 Encounter for preprocedural laboratory examination: Secondary | ICD-10-CM | POA: Diagnosis not present

## 2021-04-09 DIAGNOSIS — I10 Essential (primary) hypertension: Secondary | ICD-10-CM | POA: Diagnosis not present

## 2021-04-09 DIAGNOSIS — M25552 Pain in left hip: Secondary | ICD-10-CM

## 2021-04-09 DIAGNOSIS — M1612 Unilateral primary osteoarthritis, left hip: Secondary | ICD-10-CM

## 2021-04-09 HISTORY — DX: Anemia, unspecified: D64.9

## 2021-04-09 LAB — CBC
HCT: 38.6 % (ref 36.0–46.0)
Hemoglobin: 13.1 g/dL (ref 12.0–15.0)
MCH: 32 pg (ref 26.0–34.0)
MCHC: 33.9 g/dL (ref 30.0–36.0)
MCV: 94.4 fL (ref 80.0–100.0)
Platelets: 315 10*3/uL (ref 150–400)
RBC: 4.09 MIL/uL (ref 3.87–5.11)
RDW: 11.8 % (ref 11.5–15.5)
WBC: 8.5 10*3/uL (ref 4.0–10.5)
nRBC: 0 % (ref 0.0–0.2)

## 2021-04-09 LAB — BASIC METABOLIC PANEL
Anion gap: 8 (ref 5–15)
BUN: 11 mg/dL (ref 8–23)
CO2: 21 mmol/L — ABNORMAL LOW (ref 22–32)
Calcium: 9.5 mg/dL (ref 8.9–10.3)
Chloride: 106 mmol/L (ref 98–111)
Creatinine, Ser: 0.88 mg/dL (ref 0.44–1.00)
GFR, Estimated: >60 mL/min (ref >60)
Glucose, Bld: 108 mg/dL — ABNORMAL HIGH (ref 70–99)
Potassium: 4.1 mmol/L (ref 3.5–5.1)
Sodium: 135 mmol/L (ref 135–145)

## 2021-04-09 MED ORDER — NEBIVOLOL HCL 20 MG PO TABS: 20.0000 mg | ORAL_TABLET | Freq: Every day | ORAL | 0 refills | Status: DC

## 2021-04-11 ENCOUNTER — Ambulatory Visit (HOSPITAL_COMMUNITY): Payer: Medicare PPO | Admitting: Physical Therapy

## 2021-04-23 ENCOUNTER — Ambulatory Visit (INDEPENDENT_AMBULATORY_CARE_PROVIDER_SITE_OTHER): Payer: Medicare PPO | Admitting: Orthopaedic Surgery

## 2021-04-23 VITALS — Ht 65.0 in | Wt 125.0 lb

## 2021-04-23 DIAGNOSIS — M25552 Pain in left hip: Secondary | ICD-10-CM

## 2021-04-23 DIAGNOSIS — M1612 Unilateral primary osteoarthritis, left hip: Secondary | ICD-10-CM

## 2021-04-23 NOTE — Progress Notes (Signed)
? ?Office Visit Note ?  ?Patient: Anne Aguirre           ?Date of Birth: 18-Apr-1950           ?MRN: [id number] ?Visit Date: 04/23/2021 ?             ?Requested by: [name], MD ?[address] ?[place],  [place] [address] ?PCP: Anne Spar, MD ? ? ?Assessment & Plan: ?Visit Diagnoses:  ?1. Pain in left hip   ?2. Unilateral primary osteoarthritis, left hip   ? ? ?Plan: I went over hip replacement surgery with her in detail.  I showed her hip replacement model and her x-rays and talked about what to expect with this type of surgery.  We discussed the risks and benefits of surgery and what to expect from an intraoperative and postoperative course.  I gave her handout about hip replacement surgery as well.  From my standpoint I am usually scheduled out about 5 weeks or so.  We will work on getting on the surgical schedule and unless needs to be laid further from a general surgery standpoint.  All questions and concerns were answered and addressed. ? ?Follow-Up Instructions: Return for 2 weeks post-op.  ? ?Orders:  ?No orders of the defined types were placed in this encounter. ? ?No orders of the defined types were placed in this encounter. ? ? ? ? Procedures: ?No procedures performed ? ? ?Clinical Data: ?No additional findings. ? ? ?Subjective: ?Chief Complaint  ?Patient presents with  ? Left Hip - Pain  ?The patient is a 71 year old female sent from [place] in [place] to evaluate and treat known severe arthritis of her left hip.  She does ambulate with a cane.  She has been dealing with left hip pain for several years now but it is gotten worse over the last 12 months.  It is detrimentally affecting her mobility, her quality of life and her actives daily living.  X-rays that are on the canopy system show complete loss of joint space with bone-on-bone wear of that left hip.  She is having a hard time putting her shoes and socks on.  Her pain is daily.  It is 10 out of 10 on a daily basis.  She is  interested in hip replacement surgery.  She is actually scheduled for surgery this Wednesday by general surgery and Dr. Annye Aguirre for colostomy takedown and possible low anterior resection.  She is not on blood thinning medication.  She is thin.  She is not a diabetic.  She is having a significant difficult time crossing her left leg over right and putting her shoes and socks on the left side. ? ?HPI ? ?Review of Systems ?She is currently denies any headache, chest pain, shortness of breath, fever, chills, nausea, vomiting ? ?Objective: ?Vital Signs: Ht 5\' 5"  (1.651 m)   Wt 125 lb (56.7 kg)   BMI 20.80 kg/m?  ? ?Physical Exam ?She is alert and orient x3 and in no acute distress ?Ortho Exam ?Her right hip moves smoothly and fluidly.  The left hip has severe pain with any attempted rotation and her rotation is significantly limited. ?Specialty Comments:  ?No specialty comments available. ? ?Imaging: ?No results found. ?X-rays on the canopy system of the pelvis and left hip shows severe end-stage arthritis of the left hip.  There is complete loss of the joint space.  There is flattening of the femoral head as well as sclerotic changes and periarticular  osteophytes. ? ?PMFS History: ?Patient Active Problem List  ? Diagnosis Date Noted  ? Unilateral primary osteoarthritis, left hip 04/23/2021  ? Diverticulitis of colon 01/09/2021  ? Status post Jeanette Caprice procedure [facility]) 10/30/2020  ? Encounter to establish care 10/30/2020  ? Thrombocytosis 10/19/2020  ? Colostomy in place [facility]) 09/21/2020  ? Anemia 09/21/2020  ? Physical deconditioning 09/21/2020  ? Essential hypertension, benign 10/01/2018  ? HLD (hyperlipidemia) 10/01/2018  ? Vitamin D deficiency disease 10/01/2018  ? Osteoarthritis 10/01/2018  ? ?Past Medical History:  ?Diagnosis Date  ? Acute diverticulitis 08/23/2020  ? Anemia   ? COVID-19 virus infection ---- +ve on 09/01/2020 09/10/2020  ? Essential hypertension, benign 10/01/2018  ? HLD (hyperlipidemia)  10/01/2018  ? Osteoarthritis 10/01/2018  ? Post-op intraabdominal/Liver abscess-Had Hartmann's with Colostomy on 08/29/2020 for Sigmoid Diverticulits 09/10/2020  ? s/p ex lap and Hartman's procedure with colostomy by AR 08/29/20 for diverticulitis with abscess-- Now with Liver/intrabdominal abscess---  ? Sepsis Due to Intraabdomonal Abscess 09/10/2020  ? Vitamin D deficiency disease 10/01/2018  ?  ?Family History  ?Problem Relation Age of Onset  ? Hypertension Mother   ? Stroke Mother   ? Stroke Father   ? Hypertension Brother   ? Hypertension Son   ?  ?Past Surgical History:  ?Procedure Laterality Date  ? COLON RESECTION N/A 08/29/2020  ? Procedure: COLON RESECTION;  Surgeon: Ralene Ok, MD;  Location: [place];  Service: General;  Laterality: N/A;  ? COLOSTOMY N/A 08/29/2020  ? Procedure: COLOSTOMY;  Surgeon: Ralene Ok, MD;  Location: [place];  Service: General;  Laterality: N/A;  ? LAPAROTOMY N/A 08/29/2020  ? Procedure: EXPLORATORY LAPAROTOMY;  Surgeon: Ralene Ok, MD;  Location: [place];  Service: General;  Laterality: N/A;  ? TONSILLECTOMY Bilateral   ? ?Social History  ? ?Occupational History  ? Not on file  ?Tobacco Use  ? Smoking status: Never  ? Smokeless tobacco: Never  ?Vaping Use  ? Vaping Use: Never used  ?Substance and Sexual Activity  ? Alcohol use: Yes  ?  Alcohol/week: 7.0 standard drinks  ?  Types: 7 Glasses of wine per week  ?  Comment: occasional wine  ? Drug use: Never  ? Sexual activity: Not on file  ? ? ? ? ? ? ?

## 2021-04-24 NOTE — Anesthesia Preprocedure Evaluation (Addendum)
Anesthesia Evaluation  ?Patient identified by MRN, date of birth, ID band ?Patient awake ? ? ? ?Reviewed: ?Allergy & Precautions, NPO status , Patient's Chart, lab work & pertinent test results ? ?History of Anesthesia Complications ?Negative for: history of anesthetic complications ? ?Airway ?Mallampati: I ? ?TM Distance: >3 FB ?Neck ROM: Full ? ? ? Dental ? ?(+) Caps, Dental Advisory Given ?  ?Pulmonary ?neg pulmonary ROS,  ?  ?breath sounds clear to auscultation ? ? ? ? ? ? Cardiovascular ?hypertension, Pt. on medications and Pt. on home beta blockers ?(-) angina ?Rhythm:Regular Rate:Normal ? ? ?  ?Neuro/Psych ?negative neurological ROS ?   ? GI/Hepatic ?Neg liver ROS, Divertic: colostomy ?  ?Endo/Other  ?negative endocrine ROS ? Renal/GU ?negative Renal ROS  ? ?  ?Musculoskeletal ? ?(+) Arthritis ,  ? Abdominal ?  ?Peds ? Hematology ?negative hematology ROS ?(+)   ?Anesthesia Other Findings ? ? Reproductive/Obstetrics ? ?  ? ? ? ? ? ? ? ? ? ? ? ? ? ?  ?  ? ? ? ? ? ? ? ?Anesthesia Physical ?Anesthesia Plan ? ?ASA: 2 ? ?Anesthesia Plan: General  ? ?Post-op Pain Management: Tylenol PO (pre-op)*  ? ?Induction: Intravenous ? ?PONV Risk Score and Plan: 3 and Ondansetron, Dexamethasone and Treatment may vary due to age or medical condition ? ?Airway Management Planned: Oral ETT ? ?Additional Equipment: None ? ?Intra-op Plan:  ? ?Post-operative Plan: Extubation in OR ? ?Informed Consent: I have reviewed the patients History and Physical, chart, labs and discussed the procedure including the risks, benefits and alternatives for the proposed anesthesia with the patient or authorized representative who has indicated his/her understanding and acceptance.  ? ? ? ?Dental advisory given ? ?Plan Discussed with: CRNA and Surgeon ? ?Anesthesia Plan Comments:   ? ? ? ? ? ?Anesthesia Quick Evaluation ? ?

## 2021-04-25 ENCOUNTER — Encounter (HOSPITAL_COMMUNITY): Payer: Self-pay | Admitting: Surgery

## 2021-04-25 ENCOUNTER — Inpatient Hospital Stay (HOSPITAL_COMMUNITY): Payer: Medicare PPO | Admitting: Certified Registered"

## 2021-04-25 ENCOUNTER — Inpatient Hospital Stay (HOSPITAL_COMMUNITY): Payer: Medicare PPO | Admitting: Physician Assistant

## 2021-04-25 ENCOUNTER — Other Ambulatory Visit: Payer: Self-pay

## 2021-04-25 ENCOUNTER — Encounter (HOSPITAL_COMMUNITY)
Admission: RE | Disposition: A | Discharge status: Home / Self Care | Payer: Self-pay | Source: Ambulatory Visit | Attending: Surgery

## 2021-04-25 ENCOUNTER — Inpatient Hospital Stay (HOSPITAL_COMMUNITY)
Admission: RE | Admit: 2021-04-25 | Discharge: 2021-04-27 | DRG: 337 | Disposition: A | Discharge status: Home / Self Care | Payer: Medicare PPO | Source: Ambulatory Visit | Attending: Surgery | Admitting: Surgery

## 2021-04-25 DIAGNOSIS — Z8616 Personal history of COVID-19: Secondary | ICD-10-CM

## 2021-04-25 DIAGNOSIS — Z8249 Family history of ischemic heart disease and other diseases of the circulatory system: Secondary | ICD-10-CM

## 2021-04-25 DIAGNOSIS — I10 Essential (primary) hypertension: Secondary | ICD-10-CM | POA: Diagnosis not present

## 2021-04-25 DIAGNOSIS — Z79899 Other long term (current) drug therapy: Secondary | ICD-10-CM

## 2021-04-25 DIAGNOSIS — E785 Hyperlipidemia, unspecified: Secondary | ICD-10-CM | POA: Diagnosis not present

## 2021-04-25 DIAGNOSIS — Z8719 Personal history of other diseases of the digestive system: Secondary | ICD-10-CM

## 2021-04-25 DIAGNOSIS — K432 Incisional hernia without obstruction or gangrene: Secondary | ICD-10-CM | POA: Diagnosis not present

## 2021-04-25 DIAGNOSIS — Z433 Encounter for attention to colostomy: Principal | ICD-10-CM

## 2021-04-25 DIAGNOSIS — Z9049 Acquired absence of other specified parts of digestive tract: Secondary | ICD-10-CM

## 2021-04-25 DIAGNOSIS — K660 Peritoneal adhesions (postprocedural) (postinfection): Secondary | ICD-10-CM | POA: Diagnosis not present

## 2021-04-25 DIAGNOSIS — Z933 Colostomy status: Secondary | ICD-10-CM | POA: Diagnosis not present

## 2021-04-25 DIAGNOSIS — Z9889 Other specified postprocedural states: Principal | ICD-10-CM

## 2021-04-25 DIAGNOSIS — K9400 Colostomy complication, unspecified: Secondary | ICD-10-CM | POA: Diagnosis not present

## 2021-04-25 HISTORY — PX: XI ROBOTIC ASSISTED COLOSTOMY TAKEDOWN: SHX6828

## 2021-04-25 HISTORY — PX: LYSIS OF ADHESION: SHX5961

## 2021-04-25 SURGERY — CLOSURE, COLOSTOMY, ROBOT-ASSISTED
Anesthesia: General

## 2021-04-25 MED ORDER — SODIUM CHLORIDE 0.9 % IR SOLN
Status: DC | PRN
  Administered 2021-04-25: 1000 mL

## 2021-04-25 MED ORDER — ORAL CARE MOUTH RINSE: 15.0000 mL | Freq: Once | OROMUCOSAL | Status: AC

## 2021-04-25 MED ORDER — NEBIVOLOL HCL 10 MG PO TABS
20.0000 mg | ORAL_TABLET | Freq: Every day | ORAL | Status: DC
  Administered 2021-04-26 – 2021-04-27 (×2): 20 mg via ORAL
  Filled 2021-04-25 (×2): qty 2

## 2021-04-25 MED ORDER — CHLORHEXIDINE GLUCONATE 0.12 % MT SOLN
15.0000 mL | Freq: Once | OROMUCOSAL | Status: AC
  Administered 2021-04-25: 15 mL via OROMUCOSAL

## 2021-04-25 MED ORDER — SODIUM CHLORIDE (PF) 0.9 % IJ SOLN
INTRAMUSCULAR | Status: AC
  Filled 2021-04-25: qty 10

## 2021-04-25 MED ORDER — STERILE WATER FOR INJECTION IJ SOLN
INTRAMUSCULAR | Status: DC | PRN
  Administered 2021-04-25: 15 mL via INTRAVESICAL

## 2021-04-25 MED ORDER — LACTATED RINGERS IR SOLN
Status: DC | PRN
  Administered 2021-04-25: 1000 mL

## 2021-04-25 MED ORDER — HYDROMORPHONE HCL 1 MG/ML IJ SOLN
INTRAMUSCULAR | Status: AC
  Administered 2021-04-25: 0.5 mg via INTRAVENOUS
  Filled 2021-04-25: qty 2

## 2021-04-25 MED ORDER — BUPIVACAINE LIPOSOME 1.3 % IJ SUSP
INTRAMUSCULAR | Status: AC
  Filled 2021-04-25: qty 20

## 2021-04-25 MED ORDER — HYDRALAZINE HCL 20 MG/ML IJ SOLN: 10.0000 mg | INTRAMUSCULAR | Status: DC | PRN

## 2021-04-25 MED ORDER — ROCURONIUM BROMIDE 10 MG/ML (PF) SYRINGE
PREFILLED_SYRINGE | INTRAVENOUS | Status: DC | PRN
  Administered 2021-04-25: 60 mg via INTRAVENOUS
  Administered 2021-04-25: 40 mg via INTRAVENOUS

## 2021-04-25 MED ORDER — ROCURONIUM BROMIDE 10 MG/ML (PF) SYRINGE
PREFILLED_SYRINGE | INTRAVENOUS | Status: AC
  Filled 2021-04-25: qty 10

## 2021-04-25 MED ORDER — HEPARIN SODIUM (PORCINE) 5000 UNIT/ML IJ SOLN
INTRAMUSCULAR | Status: AC
Start: 2021-04-25 — End: 2021-04-25
  Filled 2021-04-25: qty 1

## 2021-04-25 MED ORDER — AMLODIPINE BESYLATE 10 MG PO TABS
10.0000 mg | ORAL_TABLET | Freq: Every day | ORAL | Status: DC
Start: 2021-04-26 — End: 2021-04-27
  Administered 2021-04-26 – 2021-04-27 (×2): 10 mg via ORAL
  Filled 2021-04-25 (×2): qty 1

## 2021-04-25 MED ORDER — HYDROMORPHONE HCL 1 MG/ML IJ SOLN
INTRAMUSCULAR | Status: AC
  Administered 2021-04-25: 0.5 mg via INTRAVENOUS
  Filled 2021-04-25: qty 1

## 2021-04-25 MED ORDER — ONDANSETRON HCL 4 MG/2ML IJ SOLN
INTRAMUSCULAR | Status: DC | PRN
  Administered 2021-04-25: 4 mg via INTRAVENOUS

## 2021-04-25 MED ORDER — OXYCODONE HCL 5 MG/5ML PO SOLN: 5.0000 mg | Freq: Once | ORAL | Status: AC | PRN

## 2021-04-25 MED ORDER — LACTATED RINGERS IV SOLN: INTRAVENOUS | Status: DC | PRN

## 2021-04-25 MED ORDER — LACTATED RINGERS IV SOLN: INTRAVENOUS | Status: DC

## 2021-04-25 MED ORDER — DEXAMETHASONE SODIUM PHOSPHATE 10 MG/ML IJ SOLN
INTRAMUSCULAR | Status: AC
  Filled 2021-04-25: qty 1

## 2021-04-25 MED ORDER — TIZANIDINE HCL 4 MG PO TABS
4.0000 mg | ORAL_TABLET | Freq: Three times a day (TID) | ORAL | Status: DC | PRN
  Administered 2021-04-26: 4 mg via ORAL
  Filled 2021-04-25 (×2): qty 1

## 2021-04-25 MED ORDER — LIDOCAINE HCL (PF) 2 % IJ SOLN
INTRAMUSCULAR | Status: AC
  Filled 2021-04-25: qty 5

## 2021-04-25 MED ORDER — MIDAZOLAM HCL 2 MG/2ML IJ SOLN
INTRAMUSCULAR | Status: AC
  Filled 2021-04-25: qty 2

## 2021-04-25 MED ORDER — KETAMINE HCL 50 MG/5ML IJ SOSY
PREFILLED_SYRINGE | INTRAMUSCULAR | Status: AC
  Filled 2021-04-25: qty 5

## 2021-04-25 MED ORDER — BUPIVACAINE LIPOSOME 1.3 % IJ SUSP
INTRAMUSCULAR | Status: DC | PRN
  Administered 2021-04-25: 20 mL

## 2021-04-25 MED ORDER — ONDANSETRON HCL 4 MG/2ML IJ SOLN
4.0000 mg | Freq: Four times a day (QID) | INTRAMUSCULAR | Status: DC | PRN
Start: 2021-04-25 — End: 2021-04-27

## 2021-04-25 MED ORDER — MIDAZOLAM HCL 2 MG/2ML IJ SOLN: 0.5000 mg | Freq: Once | INTRAMUSCULAR | Status: DC | PRN

## 2021-04-25 MED ORDER — IBUPROFEN 400 MG PO TABS
600.0000 mg | ORAL_TABLET | Freq: Four times a day (QID) | ORAL | Status: DC | PRN
  Administered 2021-04-27: 600 mg via ORAL
  Filled 2021-04-25: qty 1

## 2021-04-25 MED ORDER — ALVIMOPAN 12 MG PO CAPS: 12.0000 mg | ORAL_CAPSULE | Freq: Two times a day (BID) | ORAL | Status: DC

## 2021-04-25 MED ORDER — GLYCOPYRROLATE PF 0.2 MG/ML IJ SOSY
PREFILLED_SYRINGE | INTRAMUSCULAR | Status: DC | PRN
  Administered 2021-04-25: .2 mg via INTRAVENOUS

## 2021-04-25 MED ORDER — HEPARIN SODIUM (PORCINE) 5000 UNIT/ML IJ SOLN
5000.0000 [IU] | Freq: Three times a day (TID) | INTRAMUSCULAR | Status: DC
  Administered 2021-04-25 – 2021-04-27 (×6): 5000 [IU] via SUBCUTANEOUS
  Filled 2021-04-25 (×6): qty 1

## 2021-04-25 MED ORDER — SUGAMMADEX SODIUM 200 MG/2ML IV SOLN
INTRAVENOUS | Status: DC | PRN
Start: 2021-04-25 — End: 2021-04-25
  Administered 2021-04-25: 200 mg via INTRAVENOUS

## 2021-04-25 MED ORDER — FENTANYL CITRATE (PF) 100 MCG/2ML IJ SOLN
INTRAMUSCULAR | Status: AC
Start: 2021-04-25 — End: ?
  Filled 2021-04-25: qty 2

## 2021-04-25 MED ORDER — BUPIVACAINE-EPINEPHRINE (PF) 0.25% -1:200000 IJ SOLN
INTRAMUSCULAR | Status: AC
  Filled 2021-04-25: qty 30

## 2021-04-25 MED ORDER — PHENYLEPHRINE 40 MCG/ML (10ML) SYRINGE FOR IV PUSH (FOR BLOOD PRESSURE SUPPORT)
PREFILLED_SYRINGE | INTRAVENOUS | Status: AC
  Filled 2021-04-25: qty 10

## 2021-04-25 MED ORDER — ENSURE SURGERY PO LIQD
237.0000 mL | Freq: Two times a day (BID) | ORAL | Status: DC
  Administered 2021-04-26 – 2021-04-27 (×4): 237 mL via ORAL

## 2021-04-25 MED ORDER — PHENYLEPHRINE HCL-NACL 20-0.9 MG/250ML-% IV SOLN
INTRAVENOUS | Status: DC | PRN
  Administered 2021-04-25: 50 ug/min via INTRAVENOUS

## 2021-04-25 MED ORDER — STERILE WATER FOR IRRIGATION IR SOLN
Status: DC | PRN
  Administered 2021-04-25: 500 mL

## 2021-04-25 MED ORDER — DIPHENHYDRAMINE HCL 12.5 MG/5ML PO ELIX: 12.5000 mg | ORAL_SOLUTION | Freq: Four times a day (QID) | ORAL | Status: DC | PRN

## 2021-04-25 MED ORDER — FENTANYL CITRATE (PF) 100 MCG/2ML IJ SOLN
INTRAMUSCULAR | Status: DC | PRN
Start: 2021-04-25 — End: 2021-04-25
  Administered 2021-04-25 (×2): 50 ug via INTRAVENOUS
  Administered 2021-04-25: 100 ug via INTRAVENOUS

## 2021-04-25 MED ORDER — PHENYLEPHRINE HCL-NACL 20-0.9 MG/250ML-% IV SOLN
INTRAVENOUS | Status: AC
  Filled 2021-04-25: qty 250

## 2021-04-25 MED ORDER — DIPHENHYDRAMINE HCL 50 MG/ML IJ SOLN: 12.5000 mg | Freq: Four times a day (QID) | INTRAMUSCULAR | Status: DC | PRN

## 2021-04-25 MED ORDER — 0.9 % SODIUM CHLORIDE (POUR BTL) OPTIME
TOPICAL | Status: DC | PRN
  Administered 2021-04-25: 2000 mL

## 2021-04-25 MED ORDER — ACETAMINOPHEN 500 MG PO TABS
1000.0000 mg | ORAL_TABLET | Freq: Four times a day (QID) | ORAL | Status: DC
  Administered 2021-04-25 – 2021-04-27 (×8): 1000 mg via ORAL
  Filled 2021-04-25 (×8): qty 2

## 2021-04-25 MED ORDER — HYDROMORPHONE HCL 1 MG/ML IJ SOLN
0.2500 mg | INTRAMUSCULAR | Status: DC | PRN
  Administered 2021-04-25 (×2): 0.5 mg via INTRAVENOUS

## 2021-04-25 MED ORDER — HEPARIN SODIUM (PORCINE) 5000 UNIT/ML IJ SOLN
INTRAMUSCULAR | Status: DC | PRN
  Administered 2021-04-25: 5000 [IU] via SUBCUTANEOUS

## 2021-04-25 MED ORDER — DEXAMETHASONE SODIUM PHOSPHATE 10 MG/ML IJ SOLN
INTRAMUSCULAR | Status: DC | PRN
  Administered 2021-04-25: 10 mg via INTRAVENOUS

## 2021-04-25 MED ORDER — PROPOFOL 10 MG/ML IV BOLUS
INTRAVENOUS | Status: DC | PRN
  Administered 2021-04-25: 100 mg via INTRAVENOUS

## 2021-04-25 MED ORDER — ONDANSETRON HCL 4 MG/2ML IJ SOLN
INTRAMUSCULAR | Status: AC
  Filled 2021-04-25: qty 2

## 2021-04-25 MED ORDER — ACETAMINOPHEN 500 MG PO TABS
1000.0000 mg | ORAL_TABLET | Freq: Once | ORAL | Status: DC
  Filled 2021-04-25: qty 2

## 2021-04-25 MED ORDER — OXYCODONE HCL 5 MG PO TABS
ORAL_TABLET | ORAL | Status: AC
  Filled 2021-04-25: qty 1

## 2021-04-25 MED ORDER — TRAMADOL HCL 50 MG PO TABS
50.0000 mg | ORAL_TABLET | Freq: Four times a day (QID) | ORAL | Status: DC | PRN
  Administered 2021-04-25 – 2021-04-26 (×3): 50 mg via ORAL
  Filled 2021-04-25 (×3): qty 1

## 2021-04-25 MED ORDER — HYDROMORPHONE HCL 1 MG/ML IJ SOLN
0.5000 mg | INTRAMUSCULAR | Status: DC | PRN
  Administered 2021-04-25 – 2021-04-26 (×2): 0.5 mg via INTRAVENOUS
  Filled 2021-04-25 (×2): qty 0.5

## 2021-04-25 MED ORDER — SPY AGENT GREEN - (INDOCYANINE FOR INJECTION)
INTRAMUSCULAR | Status: DC | PRN
  Administered 2021-04-25: 2 mL via INTRAVENOUS

## 2021-04-25 MED ORDER — SODIUM CHLORIDE 0.9 % IV SOLN
INTRAVENOUS | Status: AC
  Filled 2021-04-25: qty 2

## 2021-04-25 MED ORDER — ALUM & MAG HYDROXIDE-SIMETH 200-200-20 MG/5ML PO SUSP: 30.0000 mL | Freq: Four times a day (QID) | ORAL | Status: DC | PRN

## 2021-04-25 MED ORDER — GLYCOPYRROLATE 0.2 MG/ML IJ SOLN
INTRAMUSCULAR | Status: AC
  Filled 2021-04-25: qty 1

## 2021-04-25 MED ORDER — HYDROMORPHONE HCL 1 MG/ML IJ SOLN
0.2500 mg | INTRAMUSCULAR | Status: DC | PRN
  Administered 2021-04-25: 0.5 mg via INTRAVENOUS

## 2021-04-25 MED ORDER — GABAPENTIN 100 MG PO CAPS: 100.0000 mg | ORAL_CAPSULE | Freq: Three times a day (TID) | ORAL | Status: DC | PRN

## 2021-04-25 MED ORDER — OXYCODONE HCL 5 MG PO TABS
5.0000 mg | ORAL_TABLET | Freq: Once | ORAL | Status: AC | PRN
  Administered 2021-04-25: 5 mg via ORAL

## 2021-04-25 MED ORDER — STERILE WATER FOR INJECTION IJ SOLN
INTRAMUSCULAR | Status: AC
  Filled 2021-04-25: qty 10

## 2021-04-25 MED ORDER — MIDAZOLAM HCL 5 MG/5ML IJ SOLN
INTRAMUSCULAR | Status: DC | PRN
  Administered 2021-04-25: 2 mg via INTRAVENOUS

## 2021-04-25 MED ORDER — SIMETHICONE 80 MG PO CHEW: 40.0000 mg | CHEWABLE_TABLET | Freq: Four times a day (QID) | ORAL | Status: DC | PRN

## 2021-04-25 MED ORDER — BUPIVACAINE-EPINEPHRINE 0.25% -1:200000 IJ SOLN
INTRAMUSCULAR | Status: DC | PRN
  Administered 2021-04-25: 30 mL

## 2021-04-25 MED ORDER — KETAMINE HCL 10 MG/ML IJ SOLN
INTRAMUSCULAR | Status: DC | PRN
  Administered 2021-04-25: 10 mg via INTRAVENOUS
  Administered 2021-04-25: 30 mg via INTRAVENOUS
  Administered 2021-04-25: 10 mg via INTRAVENOUS

## 2021-04-25 MED ORDER — PROPOFOL 10 MG/ML IV BOLUS
INTRAVENOUS | Status: AC
  Filled 2021-04-25: qty 20

## 2021-04-25 MED ORDER — SODIUM CHLORIDE 0.9 % IV SOLN
INTRAVENOUS | Status: DC | PRN
  Administered 2021-04-25: 2 g via INTRAVENOUS

## 2021-04-25 MED ORDER — PHENYLEPHRINE 40 MCG/ML (10ML) SYRINGE FOR IV PUSH (FOR BLOOD PRESSURE SUPPORT)
PREFILLED_SYRINGE | INTRAVENOUS | Status: DC | PRN
  Administered 2021-04-25: 160 ug via INTRAVENOUS

## 2021-04-25 MED ORDER — ONDANSETRON HCL 4 MG PO TABS: 4.0000 mg | ORAL_TABLET | Freq: Four times a day (QID) | ORAL | Status: DC | PRN

## 2021-04-25 MED ORDER — LIDOCAINE 2% (20 MG/ML) 5 ML SYRINGE
INTRAMUSCULAR | Status: DC | PRN
  Administered 2021-04-25: 40 mg via INTRAVENOUS

## 2021-04-25 SURGICAL SUPPLY — 110 items
BAG COUNTER SPONGE SURGICOUNT (BAG) ×3 IMPLANT
BAG URO CATCHER STRL LF (MISCELLANEOUS) ×3 IMPLANT
BLADE EXTENDED COATED 6.5IN (ELECTRODE) IMPLANT
CANNULA REDUC XI 12-8 STAPL (CANNULA)
CANNULA REDUCER 12-8 DVNC XI (CANNULA) IMPLANT
CATH URETL OPEN 5X70 (CATHETERS) ×3 IMPLANT
CELLS DAT CNTRL 66122 CELL SVR (MISCELLANEOUS) IMPLANT
CLOTH BEACON ORANGE TIMEOUT ST (SAFETY) ×3 IMPLANT
COVER SURGICAL LIGHT HANDLE (MISCELLANEOUS) ×6 IMPLANT
COVER TIP SHEARS 8 DVNC (MISCELLANEOUS) ×2 IMPLANT
COVER TIP SHEARS 8MM DA VINCI (MISCELLANEOUS) ×1
DERMABOND ADVANCED (GAUZE/BANDAGES/DRESSINGS) ×1
DERMABOND ADVANCED .7 DNX12 (GAUZE/BANDAGES/DRESSINGS) IMPLANT
DRAIN CHANNEL 19F RND (DRAIN) IMPLANT
DRAPE ARM DVNC X/XI (DISPOSABLE) ×8 IMPLANT
DRAPE COLUMN DVNC XI (DISPOSABLE) ×2 IMPLANT
DRAPE DA VINCI XI ARM (DISPOSABLE) ×4
DRAPE DA VINCI XI COLUMN (DISPOSABLE) ×1
DRAPE SURG IRRIG POUCH 19X23 (DRAPES) ×3 IMPLANT
DRSG OPSITE POSTOP 4X10 (GAUZE/BANDAGES/DRESSINGS) IMPLANT
DRSG OPSITE POSTOP 4X6 (GAUZE/BANDAGES/DRESSINGS) IMPLANT
DRSG OPSITE POSTOP 4X8 (GAUZE/BANDAGES/DRESSINGS) IMPLANT
DRSG PAD ABDOMINAL 8X10 ST (GAUZE/BANDAGES/DRESSINGS) ×1 IMPLANT
ELECT PENCIL ROCKER SW 15FT (MISCELLANEOUS) ×3 IMPLANT
ELECT REM PT RETURN 15FT ADLT (MISCELLANEOUS) ×3 IMPLANT
ENDOLOOP SUT PDS II  0 18 (SUTURE)
ENDOLOOP SUT PDS II 0 18 (SUTURE) IMPLANT
EVACUATOR SILICONE 100CC (DRAIN) IMPLANT
GAUZE SPONGE 4X4 12PLY STRL (GAUZE/BANDAGES/DRESSINGS) ×1 IMPLANT
GLOVE BIO SURGEON STRL SZ 6.5 (GLOVE) ×9 IMPLANT
GLOVE BIOGEL PI IND STRL 7.0 (GLOVE) ×4 IMPLANT
GLOVE BIOGEL PI INDICATOR 7.0 (GLOVE) ×2
GLOVE INDICATOR 6.5 STRL GRN (GLOVE) ×3 IMPLANT
GLOVE SURG LX 7.5 STRW (GLOVE) ×1
GLOVE SURG LX STRL 7.5 STRW (GLOVE) ×2 IMPLANT
GOWN STRL REUS W/ TWL XL LVL3 (GOWN DISPOSABLE) ×8 IMPLANT
GOWN STRL REUS W/TWL XL LVL3 (GOWN DISPOSABLE) ×4
GRASPER SUT TROCAR 14GX15 (MISCELLANEOUS) IMPLANT
GUIDEWIRE ZIPWRE .038 STRAIGHT (WIRE) ×3 IMPLANT
HOLDER FOLEY CATH W/STRAP (MISCELLANEOUS) ×3 IMPLANT
IRRIG SUCT STRYKERFLOW 2 WTIP (MISCELLANEOUS) ×3
IRRIGATION SUCT STRKRFLW 2 WTP (MISCELLANEOUS) ×2 IMPLANT
KIT PROCEDURE DA VINCI SI (MISCELLANEOUS) ×1
KIT PROCEDURE DVNC SI (MISCELLANEOUS) IMPLANT
KIT TURNOVER KIT A (KITS) IMPLANT
MANIFOLD NEPTUNE II (INSTRUMENTS) ×3 IMPLANT
NDL INSUFFLATION 14GA 120MM (NEEDLE) ×2 IMPLANT
NEEDLE INSUFFLATION 14GA 120MM (NEEDLE) ×3 IMPLANT
PACK CARDIOVASCULAR III (CUSTOM PROCEDURE TRAY) ×3 IMPLANT
PACK COLON (CUSTOM PROCEDURE TRAY) ×3 IMPLANT
PACK CYSTO (CUSTOM PROCEDURE TRAY) ×3 IMPLANT
PAD POSITIONING PINK XL (MISCELLANEOUS) ×3 IMPLANT
RELOAD STAPLE 60 3.5 BLU DVNC (STAPLE) IMPLANT
RELOAD STAPLE 60 4.1 GRN THCK (STAPLE) IMPLANT
RELOAD STAPLE 60 4.3 GRN DVNC (STAPLE) IMPLANT
RELOAD STAPLER 3.5X60 BLU DVNC (STAPLE) IMPLANT
RELOAD STAPLER 4.3X60 GRN DVNC (STAPLE) IMPLANT
RELOAD STAPLER GREEN 60MM (STAPLE) ×2 IMPLANT
RETRACTOR WND ALEXIS 18 MED (MISCELLANEOUS) IMPLANT
RTRCTR WOUND ALEXIS 18CM MED (MISCELLANEOUS)
SCISSORS LAP 5X35 DISP (ENDOMECHANICALS) ×1 IMPLANT
SEAL CANN UNIV 5-8 DVNC XI (MISCELLANEOUS) ×6 IMPLANT
SEAL XI 5MM-8MM UNIVERSAL (MISCELLANEOUS) ×3
SEALER VESSEL DA VINCI XI (MISCELLANEOUS) ×1
SEALER VESSEL EXT DVNC XI (MISCELLANEOUS) ×2 IMPLANT
SOLUTION ELECTROLUBE (MISCELLANEOUS) ×3 IMPLANT
SPIKE FLUID TRANSFER (MISCELLANEOUS) IMPLANT
STAPLE ECHEON FLEX 60 POW ENDO (STAPLE) ×1 IMPLANT
STAPLER 60 DA VINCI SURE FORM (STAPLE)
STAPLER 60 SUREFORM DVNC (STAPLE) IMPLANT
STAPLER CANNULA SEAL DVNC XI (STAPLE) IMPLANT
STAPLER CANNULA SEAL XI (STAPLE)
STAPLER ECHELON POWER CIR 29 (STAPLE) IMPLANT
STAPLER ECHELON POWER CIR 31 (STAPLE) IMPLANT
STAPLER RELOAD 3.5X60 BLU DVNC (STAPLE)
STAPLER RELOAD 3.5X60 BLUE (STAPLE)
STAPLER RELOAD 4.3X60 GREEN (STAPLE)
STAPLER RELOAD 4.3X60 GRN DVNC (STAPLE)
STAPLER RELOAD GREEN 60MM (STAPLE) ×3
STOPCOCK 4 WAY LG BORE MALE ST (IV SETS) ×6 IMPLANT
SUT ETHILON 2 0 PS N (SUTURE) IMPLANT
SUT NOVA NAB DX-16 0-1 5-0 T12 (SUTURE) ×6 IMPLANT
SUT PDS AB 1 CT1 27 (SUTURE) ×3 IMPLANT
SUT PROLENE 2 0 KS (SUTURE) ×1 IMPLANT
SUT SILK 2 0 (SUTURE) ×1
SUT SILK 2 0 SH CR/8 (SUTURE) IMPLANT
SUT SILK 2-0 18XBRD TIE 12 (SUTURE) ×2 IMPLANT
SUT SILK 3 0 (SUTURE)
SUT SILK 3 0 SH CR/8 (SUTURE) ×3 IMPLANT
SUT SILK 3-0 18XBRD TIE 12 (SUTURE) IMPLANT
SUT V-LOC BARB 180 2/0GR6 GS22 (SUTURE)
SUT VIC AB 2-0 SH 18 (SUTURE) IMPLANT
SUT VIC AB 2-0 SH 27 (SUTURE) ×2
SUT VIC AB 2-0 SH 27X BRD (SUTURE) IMPLANT
SUT VIC AB 3-0 SH 18 (SUTURE) IMPLANT
SUT VIC AB 4-0 PS2 27 (SUTURE) ×6 IMPLANT
SUT VICRYL 0 UR6 27IN ABS (SUTURE) ×3 IMPLANT
SUTURE V-LC BRB 180 2/0GR6GS22 (SUTURE) IMPLANT
SYR 10ML ECCENTRIC (SYRINGE) ×3 IMPLANT
SYS LAPSCP GELPORT 120MM (MISCELLANEOUS)
SYS WOUND ALEXIS 18CM MED (MISCELLANEOUS) ×3
SYSTEM LAPSCP GELPORT 120MM (MISCELLANEOUS) IMPLANT
SYSTEM WOUND ALEXIS 18CM MED (MISCELLANEOUS) IMPLANT
TOWEL OR 17X26 10 PK STRL BLUE (TOWEL DISPOSABLE) IMPLANT
TOWEL OR NON WOVEN STRL DISP B (DISPOSABLE) ×3 IMPLANT
TRAY FOLEY MTR SLVR 14FR STAT (SET/KITS/TRAYS/PACK) ×1 IMPLANT
TRAY FOLEY MTR SLVR 16FR STAT (SET/KITS/TRAYS/PACK) ×2 IMPLANT
TROCAR ADV FIXATION 5X100MM (TROCAR) ×3 IMPLANT
TUBING CONNECTING 10 (TUBING) ×9 IMPLANT
TUBING INSUFFLATION 10FT LAP (TUBING) ×3 IMPLANT

## 2021-04-25 NOTE — Transfer of Care (Signed)
Immediate Anesthesia Transfer of Care Note ? ?Patient: Anne Aguirre ? ?Procedure(s) Performed: XI ROBOTIC ASSISTED COLOSTOMY TAKEDOWN WITH LOW ANTERIOR RESECTION, WITH BILATERAL TAP BLOCK, FLEXIBLE SIGMOIDOSCOPY, AND INTRAOPERATIVE ASSESSMENT OF PERFUSION USING FIREFLY ?LYSIS OF ADHESION ?CYSTOSCOPY WITH BILATERAL FIREFLY INSTILLATION (Bilateral) ? ?Patient Location: PACU ? ?Anesthesia Type:General ? ?Level of Consciousness: awake, alert  and oriented ? ?Airway & Oxygen Therapy: Patient Spontanous Breathing and Patient connected to face mask ? ?Post-op Assessment: Report given to RN and Post -op Vital signs reviewed and stable ? ?Post vital signs: Reviewed and stable ? ?Last Vitals:  ?Vitals Value Taken Time  ?BP 132/75 04/25/21 1303  ?Temp 36.4 ?C 04/25/21 1303  ?Pulse 76 04/25/21 1308  ?Resp 16 04/25/21 1308  ?SpO2 99 % 04/25/21 1308  ?Vitals shown include unvalidated device data. ? ?Last Pain:  ?Vitals:  ? 04/25/21 0712  ?TempSrc:   ?PainSc: 0-No pain  ?   ? ?  ? ?Complications: No notable events documented. ?

## 2021-04-25 NOTE — Anesthesia Postprocedure Evaluation (Signed)
Anesthesia Post Note ? ?Patient: Kriya Westra ? ?Procedure(s) Performed: XI ROBOTIC ASSISTED COLOSTOMY TAKEDOWN WITH LOW ANTERIOR RESECTION, WITH BILATERAL TAP BLOCK, FLEXIBLE SIGMOIDOSCOPY, AND INTRAOPERATIVE ASSESSMENT OF PERFUSION USING FIREFLY ?LYSIS OF ADHESION ?CYSTOSCOPY WITH BILATERAL FIREFLY INSTILLATION (Bilateral) ? ?  ? ?Patient location during evaluation: PACU ?Anesthesia Type: General ?Level of consciousness: awake and alert, patient cooperative and oriented ?Pain management: pain level controlled ?Vital Signs Assessment: post-procedure vital signs reviewed and stable ?Respiratory status: spontaneous breathing, nonlabored ventilation, respiratory function stable and patient connected to nasal cannula oxygen ?Cardiovascular status: blood pressure returned to baseline and stable ?Postop Assessment: no apparent nausea or vomiting ?Anesthetic complications: no ? ? ?No notable events documented. ? ?Last Vitals:  ?Vitals:  ? 04/25/21 1500 04/25/21 1517  ?BP: 115/73 126/72  ?Pulse: 67 77  ?Resp: 17 18  ?Temp: 36.6 ?C   ?SpO2: 95% 97%  ?  ?Last Pain:  ?Vitals:  ? 04/25/21 1517  ?TempSrc:   ?PainSc: 0-No pain  ? ? ?  ?  ?  ?  ?  ?  ? ?Randle Shatzer,E. Cartina Brousseau ? ? ? ? ?

## 2021-04-25 NOTE — H&P (Signed)
? ?CC: Here today for surgery ? ?HPI: ?Anne Aguirre is an 71 y.o. female for follow-up for colostomy status, colostomy stenosis ? ?Patient underwent exploratory laparotomy, Hartman's colostomy on August 16 by one of my partners Dr. Rosendo Gros. This was secondary to perforated diverticulitis, feculent peritonitis. ? ?She did have a readmission secondary to multiple small abscesses that were not amenable to drainage. She was treated with IV antibiotics.  ? ?Final pathology -diverticulitis with perforation, pericolic abscess and acute serositis. No malignancy. ? ?She was following with one of my partners, Dr. Rosendo Gros. Her last colonoscopy was in 1999. She had a colostomy attempt with [facility] and they are able to evaluate her rectum but were unable to pass the scope through her actual ostomy due to some stenosis at the level of the skin. She was subsequently referred to see me. She reports her ostomy is been working well. She denies any issue with constipation or significant abdominal pain. She has developed what appears to be a large incisional hernia to the right of midline. She denies any significant pain associate with this but if she spends all day on her feet will notice that is larger and she will have to lay down at night to massage her back in place. No nausea or vomiting. Appetite has been good. ? ?OR 02/16/21 -revision of colostomy stenosis. She has been doing well. Her ostomy is been working great. She denies any abdominal pain, nausea or vomiting. She reports that her ostomy size has been stable since her revision and does not appear to be shrinking this go around. She is here today with her husband. She denies any changes in her health or health history since we met in the office in follow-up - completed cscope and reports due for next in 10 years. Reports tolerated prep with satisfactory result ? ? ?PMH: HTN ? ?PSH: Exlap/Hartmann's 08/2020 ? ?FHx: Denies any known family history of colorectal, breast,  endometrial or ovarian cancer ? ?Social Hx: Denies use of tobacco/EtOH/illicit drug. She is here today with her husband ? ?Review of Systems: ?A complete review of systems was obtained from the patient. I have reviewed this information and discussed as appropriate with the patient. See HPI as well for other ROS. ? ?Past Medical History:  ?Diagnosis Date  ? Acute diverticulitis 08/23/2020  ? Anemia   ? COVID-19 virus infection ---- +ve on 09/01/2020 09/10/2020  ? Essential hypertension, benign 10/01/2018  ? HLD (hyperlipidemia) 10/01/2018  ? Osteoarthritis 10/01/2018  ? Post-op intraabdominal/Liver abscess-Had Hartmann's with Colostomy on 08/29/2020 for Sigmoid Diverticulits 09/10/2020  ? s/p ex lap and Hartman's procedure with colostomy by AR 08/29/20 for diverticulitis with abscess-- Now with Liver/intrabdominal abscess---  ? Sepsis Due to Intraabdomonal Abscess 09/10/2020  ? Vitamin D deficiency disease 10/01/2018  ? ? ?Past Surgical History:  ?Procedure Laterality Date  ? COLON RESECTION N/A 08/29/2020  ? Procedure: COLON RESECTION;  Surgeon: Ralene Ok, MD;  Location: [place];  Service: General;  Laterality: N/A;  ? COLOSTOMY N/A 08/29/2020  ? Procedure: COLOSTOMY;  Surgeon: Ralene Ok, MD;  Location: [place];  Service: General;  Laterality: N/A;  ? LAPAROTOMY N/A 08/29/2020  ? Procedure: EXPLORATORY LAPAROTOMY;  Surgeon: Ralene Ok, MD;  Location: [place];  Service: General;  Laterality: N/A;  ? TONSILLECTOMY Bilateral   ? ? ?Family History  ?Problem Relation Age of Onset  ? Hypertension Mother   ? Stroke Mother   ? Stroke Father   ? Hypertension Brother   ? Hypertension  Son   ? ? ?Social:  reports that she has never smoked. She has never used smokeless tobacco. She reports current alcohol use of about 7.0 standard drinks per week. She reports that she does not use drugs. ? ?Allergies: No Known Allergies ? ?Medications: I have reviewed the patient's current medications. ? ?No results found for this or  any previous visit (from the past 48 hour(s)). ? ?No results found. ? ?ROS - all of the below systems have been reviewed with the patient and positives are indicated with bold text ?General: chills, fever or night sweats ?Eyes: blurry vision or double vision ?ENT: epistaxis or sore throat ?Allergy/Immunology: itchy/watery eyes or nasal congestion ?Hematologic/Lymphatic: bleeding problems, blood clots or swollen lymph nodes ?Endocrine: temperature intolerance or unexpected weight changes ?Breast: new or changing breast lumps or nipple discharge ?Resp: cough, shortness of breath, or wheezing ?CV: chest pain or dyspnea on exertion ?GI: as per HPI ?GU: dysuria, trouble voiding, or hematuria ?MSK: joint pain or joint stiffness ?Neuro: TIA or stroke symptoms ?Derm: pruritus and skin lesion changes ?Psych: anxiety and depression ? ?PE ?Blood pressure 90/63, pulse (!) 57, temperature 98.2 ?F (36.8 ?C), temperature source Oral, resp. rate 16, height _0  (1.651 m), weight 56.7 kg, SpO2 100 %. ?Constitutional: NAD; conversant ?Eyes: Moist conjunctiva; no lid lag ?Neck: Trachea midline; no thyromegaly ?Lungs: Normal respiratory effort; no tactile fremitus ?CV: RRR; no palpable thrills; no pitting edema ?GI: Abd soft, NT/ND ?MSK: Normal range of motion of extremities ?Psychiatric: Appropriate affect; alert and oriented x3 ? ?No results found for this or any previous visit (from the past 48 hour(s)). ? ?No results found. ? ? ?A/P: ?Anne Aguirre is an 71 y.o. female with hx of HTN here for evaluation of colostomy stenosis in the setting of a descending colostomy following a Hartman's procedure 08/2020 for perforated diverticulitis by Dr. Rosendo Gros. Now s/p colostomy revision 02/16/21 so she could undergo colonoscopy prior to colostomy reversal ? ?GGE 02/26/21 - Unremarkable appearance of rectal stump.  ? ?-Colonoscopy completed ? ?-The anatomy and physiology of the GI tract was reviewed with the patient as it pertains to her current  anatomy ?-She remains motivated to have her colostomy taken down/reversed. She understands this is a "elective" procedure. We discussed robotic assisted colostomy takedown, possible lysis of adhesions, possible sigmoidectomy/low anterior resection. Cystoscopy/ureteral ICG by [facility]. ?-The planned procedure, material risks (including, but not limited to, pain, bleeding, infection, scarring, need for blood transfusion, damage to surrounding structures- blood vessels/nerves/viscus/organs, damage to ureter, urine leak, leak from anastomosis, need for additional procedures, scenarios where a stoma may be necessary and where it may be permanent, worsening of pre-existing medical conditions, hernia, recurrence, pneumonia, heart attack, stroke, death) benefits and alternatives to surgery were discussed at length. The patient's questions were answered to her satisfaction, she voiced understanding and elected to proceed with surgery. Additionally, we discussed typical postoperative expectations and the recovery process. ? ?Nadeen Landau, MD FACS ?[facility] ?

## 2021-04-25 NOTE — Progress Notes (Signed)
Pharmacy Note  ? ? ?Per medication specific order:  ?" Failure to order pre-op dose disqualifies the patient from receiving any post-op doses."  ? ?Pt was not ordered pre-op dose.  ? ?Therefore, the Entereg order has been discontinued.  ? ?Adalberto Cole, PharmD, BCPS ?04/25/2021 3:19 PM ? ?

## 2021-04-25 NOTE — Progress Notes (Signed)
Wasted 1 mg dilaudid with [facility],Rn due to syringe fell apart on patient on the drug ran out on patient so she did not get the dose. New dose pulled and patient given pain med. Charge Rn KP informed reported waste in pyxsis and put note in chart.  ?

## 2021-04-25 NOTE — Consult Note (Signed)
Urology Consult  ? ?Physician requesting consult: Marin Olp, MD ? ?Reason for consult: Colostomy take down ? ?History of Present Illness: Anne Aguirre is a 71 y.o. with a history of perforated diverticulitis requiring colon resection and colostomy.  She is here today for colostomy reversal with Dr. Cliffton Asters.  ? ?The patient denies a history of voiding or storage urinary symptoms, hematuria, UTIs, STDs, urolithiasis, GU malignancy/trauma/surgery. ? ?Past Medical History:  ?Diagnosis Date  ? Acute diverticulitis 08/23/2020  ? Anemia   ? COVID-19 virus infection ---- +ve on 09/01/2020 09/10/2020  ? Essential hypertension, benign 10/01/2018  ? HLD (hyperlipidemia) 10/01/2018  ? Osteoarthritis 10/01/2018  ? Post-op intraabdominal/Liver abscess-Had Hartmann's with Colostomy on 08/29/2020 for Sigmoid Diverticulits 09/10/2020  ? s/p ex lap and Hartman's procedure with colostomy by AR 08/29/20 for diverticulitis with abscess-- Now with Liver/intrabdominal abscess---  ? Sepsis Due to Intraabdomonal Abscess 09/10/2020  ? Vitamin D deficiency disease 10/01/2018  ? ? ?Past Surgical History:  ?Procedure Laterality Date  ? COLON RESECTION N/A 08/29/2020  ? Procedure: COLON RESECTION;  Surgeon: Axel Filler, MD;  Location: [facility];  Service: General;  Laterality: N/A;  ? COLOSTOMY N/A 08/29/2020  ? Procedure: COLOSTOMY;  Surgeon: Axel Filler, MD;  Location: [facility] OR;  Service: General;  Laterality: N/A;  ? LAPAROTOMY N/A 08/29/2020  ? Procedure: EXPLORATORY LAPAROTOMY;  Surgeon: Axel Filler, MD;  Location[facility];  Service: General;  Laterality: N/A;  ? TONSILLECTOMY Bilateral   ? ? ?Current Hospital Medications: ? ?Home Meds:  ?Current Meds  ?Medication Sig  ? acetaminophen (TYLENOL) 500 MG tablet Take 1,000 mg by mouth every 6 (six) hours as needed for moderate pain.  ? amLODipine (NORVASC) 10 MG tablet Take 1 tablet by mouth once daily  ? Camphor-Menthol-Methyl Sal (SALONPAS) 3.01-20-08 % PTCH Place 1 patch onto the skin  daily as needed (pain).  ? gabapentin (NEURONTIN) 100 MG capsule Take 1 capsule (100 mg total) by mouth every 8 (eight) hours as needed.  ? Nebivolol HCl 20 MG TABS Take 1 tablet (20 mg total) by mouth daily at 12 noon.  ? tiZANidine (ZANAFLEX) 4 MG tablet Take 1 tablet (4 mg total) by mouth every 8 (eight) hours as needed for muscle spasms.  ? [DISCONTINUED] amLODipine (NORVASC) 10 MG tablet Take 1 tablet (10 mg total) by mouth daily.  ? [DISCONTINUED] Nebivolol HCl 20 MG TABS Take 1 tablet (20 mg total) by mouth daily at 12 noon.  ? ? ?Scheduled Meds: ? acetaminophen  1,000 mg Oral Once  ? phenylephrine      ? ?Continuous Infusions: ? lactated ringers 10 mL/hr at 04/25/21 0747  ? ?PRN Meds:. ? ?Allergies: No Known Allergies ? ?Family History  ?Problem Relation Age of Onset  ? Hypertension Mother   ? Stroke Mother   ? Stroke Father   ? Hypertension Brother   ? Hypertension Son   ? ? ?Social History:  reports that she has never smoked. She has never used smokeless tobacco. She reports current alcohol use of about 7.0 standard drinks per week. She reports that she does not use drugs. ? ?ROS: ?A complete review of systems was performed.  All systems are negative except for pertinent findings as noted. ? ?Physical Exam:  ?Vital signs in last 24 hours: ?Temp:  [98.2 ?F (36.8 ?C)] 98.2 ?F (36.8 ?C) (04/12 4401) ?Pulse Rate:  [57] 57 (04/12 0708) ?Resp:  [16] 16 (04/12 0708) ?BP: (90)/(63) 90/63 (04/12 0708) ?SpO2:  [100 %] 100 % (04/12 0708) ?  Weight:  [56.7 kg] 56.7 kg (04/12 0712) ?Constitutional:  Alert and oriented, No acute distress ?Psychiatric: Normal mood and affect ? ?Laboratory Data:  ?No results for input(s): WBC, HGB, HCT, PLT in the last 72 hours. ? ?No results for input(s): NA, K, CL, GLUCOSE, BUN, CALCIUM, CREATININE in the last 72 hours. ? ?Invalid input(s): CO3 ? ? ?No results found for this or any previous visit (from the past 24 hour(s)). ?No results found for this or any previous visit (from the past  240 hour(s)). ? ?Renal Function: ?No results for input(s): CREATININE in the last 168 hours. ?Estimated Creatinine Clearance: 54 mL/min (by C-G formula based on SCr of 0.88 mg/dL). ? ?Radiologic Imaging: ?No results found. ? ?I independently reviewed the above imaging studies. ? ?Impression/Recommendation ?71 year old female with a history of perforated diverticulitis.  She is here today for colostomy reversal ? ?The risks, benefits and alternatives of cystoscopy with ureteral firefly injection was discussed with the patient.  She voices understanding and wishes to proceed.  ? ?Rhoderick Moody, MD ?[facility] ?04/25/2021, 8:24 AM  ? ? ? ? ?

## 2021-04-25 NOTE — Op Note (Signed)
Operative Note ? ?Preoperative diagnosis:  ?1.  History of perforated diverticulitis ? ?Postoperative diagnosis: ?1.  History of perforated diverticulitis ? ?Procedure(s): ?1.  Cystoscopy with bilateral ureteral FireFly injections ? ?Surgeon: Rhoderick Moody, MD ? ?Assistants:  None  ? ?Anesthesia:  General ? ?Complications:  None ? ?EBL:  <5 mL ? ?Specimens: ?1. None ? ?Drains/Catheters: ?1.  16 French Foley ? ?Intraoperative findings:   ?No intravesical pathology was seen on cystoscopy ? ?Indication:  The patient is a 71 year old female with a history of perforated diverticulitis requiring a partial colectomy with colostomy with Dr. Derrell Lolling.  She is here today for colostomy reversal with Dr. Cliffton Asters.  Urology has been consulted to performed cystoscopy with bilateral ureteral Fire Fly injection to aide in intraoperative ureteral identification. The patient has been consented for the above procedures, voices understanding and wishes to proceed.  The patient has been consented for the above procedures, voices understanding and wishes to procede ? ?Description of procedure: ?After informed consent was obtained, the patient was brought to the operating room and general endotracheal anesthesia was administered. The patient was then placed in the dorsolithotomy position and prepped and draped in usual sterile fashion. A timeout was performed. A 21 French rigid cystoscope was then inserted into the urethral meatus and advanced into the bladder under direct vision. A complete bladder survey revealed no intravesical pathology. ?  ?A 6 [place] open-ended catheter was then used to intubate the right ureteral orifice and a total of 7.5 mL of firefly solution diluted with 10 mL of saline was injected into the right collecting system. A similar maneuver was then carried out on the left with the same volume and concentration of firefly. The rigid cystoscope was then removed under direct vision. A 16 French Foley catheter was then  inserted and placed to gravity drainage. The patient tolerated the procedure well. Dr. Cliffton Asters then proceeded with their portion of the case ? ?Plan:  Foley removal is at the discretion of the primary team.  ? ?

## 2021-04-25 NOTE — TOC Initial Note (Signed)
Transition of Care (TOC) - Initial/Assessment Note  ? ? ?Patient Details  ?Name: Anne Aguirre ?MRN: [id number] ?Date of Birth: 01-26-1950 ? ?Transition of Care (TOC) CM/SW Contact:    ?Cecille Po, RN ?Phone Number: ?04/25/2021, 4:15 PM ? ?Clinical Narrative:                 ? ?Transition of Care Department [facility]) has reviewed patient and no TOC needs have been identified at this time. We will continue to monitor patient advancement through interdisciplinary progression rounds. If new patient transition needs arise, please place a TOC consult. ? ? ?Expected Discharge Plan: Home/Self Care ?Barriers to Discharge: Continued Medical Work up ? ? ?Expected Discharge Plan and Services ?Expected Discharge Plan: Home/Self Care ?  ?   ?Living arrangements for the past 2 months: Single Family Home ?                ?  ?Prior Living Arrangements/Services ?Living arrangements for the past 2 months: Single Family Home ?Lives with:: Spouse ?Patient language and need for interpreter reviewed:: Yes ?       ?Need for Family Participation in Patient Care: Yes (Comment) ?Care giver support system in place?: Yes (comment) ?  ?Criminal Activity/Legal Involvement Pertinent to Current Situation/Hospitalization: No - Comment as needed ? ?Emotional Assessment ? Orientation: : Oriented to Self, Oriented to Place, Oriented to  Time, Oriented to Situation ?Alcohol / Substance Use: Not Applicable ?Psych Involvement: No (comment) ? ?Admission diagnosis:  S/P colostomy takedown [Z98.890] ?Patient Active Problem List  ? Diagnosis Date Noted  ? S/P colostomy takedown 04/25/2021  ? Unilateral primary osteoarthritis, left hip 04/23/2021  ? Diverticulitis of colon 01/09/2021  ? Status post Gertie Gowda procedure [facility]) 10/30/2020  ? Encounter to establish care 10/30/2020  ? Thrombocytosis 10/19/2020  ? Colostomy in place [facility]) 09/21/2020  ? Anemia 09/21/2020  ? Physical deconditioning 09/21/2020  ? Essential hypertension, benign 10/01/2018  ? HLD  (hyperlipidemia) 10/01/2018  ? Vitamin D deficiency disease 10/01/2018  ? Osteoarthritis 10/01/2018  ? ?PCP:  Anabel Halon, MD ?Pharmacy:   ?[facility] - [place], [place] - [address] ?[address] ?[place] [address] ?Phone: [phone] Fax: [phone] ? ?[facility] - [place], [place] - [address] ?[address] ?[place] [address] ?Phone: [phone] Fax: [phone] ? ?[facility] ?[address] ?[place] [address] ?Phone: [phone] Fax: [phone] ? ? ?

## 2021-04-25 NOTE — Anesthesia Procedure Notes (Signed)
Procedure Name: Intubation ?Date/Time: 04/25/2021 8:57 AM ?Performed by: Rosaland Lao, CRNA ?Pre-anesthesia Checklist: Patient identified, Emergency Drugs available and Patient being monitored ?Patient Re-evaluated:Patient Re-evaluated prior to induction ?Oxygen Delivery Method: Circle system utilized ?Preoxygenation: Pre-oxygenation with 100% oxygen ?Induction Type: IV induction ?Ventilation: Mask ventilation without difficulty ?Laryngoscope Size: Mac and 4 ?Grade View: Grade I ?Tube type: Oral ?Tube size: 7.0 mm ?Number of attempts: 1 ?Airway Equipment and Method: Stylet ?Placement Confirmation: ETT inserted through vocal cords under direct vision, positive ETCO2 and breath sounds checked- equal and bilateral ?Secured at: 22 cm ?Tube secured with: Tape ?Dental Injury: Teeth and Oropharynx as per pre-operative assessment  ? ? ? ? ?

## 2021-04-25 NOTE — Op Note (Signed)
PATIENT: Anne Aguirre  71 y.o. female ? ?Patient Care Team: ?Anabel Halon, MD as PCP - General (Internal Medicine) ? ?PREOP DIAGNOSIS: COLOSTOMY STATUS ? ?POSTOP DIAGNOSIS: COLOSTOMY STATUS ? ?PROCEDURE:  ?Robotic assisted takedown of end colostomy ?Low anterior resection ?Robotic lysis of adhesions x 120 minutes ?Intraoperative assessment of perfusion using ICG fluorescence imaging ?Flexible sigmoidoscopy ?Bilateral transversus abdominus plane (TAP) blocks ? ?SURGEON: Stephanie Coup. Krissy Orebaugh, MD ? ?ASSISTANT: Romie Levee, MD ? ?ANESTHESIA: General endotracheal ? ?EBL: 40 mL ?Total I/O ?In: 2000 [I.V.:1900; IV Piggyback:100] ?Out: 240 [Urine:200; Blood:40] ? ?DRAINS: None ? ?SPECIMEN:  ?Colostomy ?Rectosigmoid colon ? ?COUNTS: Sponge, needle and instrument counts were reported correct x2 ? ?FINDINGS:  ?Fairly significant adhesions related to her prior surgery and inflammatory process.  These adhesions consisted of both omentum, colon, and small bowel.  Adhesiolysis was necessary to carry out the procedure and facilitate takedown of the colostomy. ? ?Remnant sigmoid in the pelvis marked with Prolene sutures.  This was resected down to the level of healthy proximal rectum to facilitate a healthy colorectal anastomosis. ? ? A well perfused, tension free, hemostatic, air tight 29 mm EEA colorectal anastomosis fashioned 12 cm from the anal verge by flexible sigmoidoscopy. ? ? ?NARRATIVE: ?Informed consent was verified. The patient was taken to the operating room, placed supine on the operating table and SCD's were applied. General endotracheal anesthesia was induced without difficulty. She was then positioned in the lithotomy position with Allen stirrups.  Pressure points were evaluated and padded.  A foley catheter was then placed by nursing under sterile conditions. Hair on the abdomen was clipped.  She was secured to the operating table. Dr. Liliane Shi with urology scrubbed for his portion of the  procedure-cystoscopy/ureteral ICG.  The ostomy appliance was subsequently taken down.  A suture was placed to approximate the edges of the colostomy.  The abdomen was then prepped and draped in the standard sterile fashion. Surgical timeout was called indicating the correct patient, procedure, positioning and need for preoperative antibiotics. ?  ?An OG tube was placed by anesthesia and confirmed to be to suction.  At Palmer's point, a stab incision was created and the Veress needle was introduced into the peritoneal cavity on the first attempt.  Intraperitoneal location was confirmed by the aspiration and saline drop test.  Pneumoperitoneum was established to a maximum pressure of 15 mmHg using CO2.  Following this, the abdomen was marked for planned trocar sites.  Just to the right and cephalad to the umbilicus, an 8 mm incision was created and an 8 mm blunt tipped robotic trocar was cautiously placed into the peritoneal cavity.  The laparoscope was inserted and demonstrated no evidence of trocar site nor Veress needle site complications.  The Veress needle was removed.  Bilateral transversus abdominis plane blocks were then created using a dilute mixture of Exparel with Marcaine.  3 additional 8 mm robotic trochars were placed under direct visualization roughly in a line extending from the right ASIS towards the left upper quadrant.  There are adhesions across the midline and a wide necked incisional hernia that is essentially the length of her prior incision.  Adhesiolysis was then carried out initially laparoscopically using scissors.  Adhesions of omentum and small bowel were taken down from the right abdominal wall to facilitate placement of 1 additional assistant port.  An additional 5 mm assist port was placed in the right lateral abdomen under direct visualization.  The abdomen was surveyed and there was significant adhesions across  the midline and along her left hemiabdomen as well as in the pelvis. She  was positioned in Trendelenburg with the left side tilted slightly up.  Small bowel was carefully retracted out of the pelvis.  The robot was then docked and I went to the console. ? ?We began with adhesiolysis. Adhesions consisting of small bowel and omentum were taken down from the central portion of the abdominal wall.  She has a large wide necked defect covered in skin at this location.  Tedious adhesiolysis is then carried out.  We then approached adhesions along the left abdominal wall.  The omentum was dissected free from the abdominal wall and from the descending colon/colostomy.  Small bowel was then carefully retracted out of the pelvis.  There are additional adhesions of small bowel in the pelvis.  These were then carefully lysed sharply.  We were ultimately able to free the small bowel and its associated mesentery from the pelvis.  Prolene sutures were seen attached to her rectosigmoid stump.  The small bowel was all then inspected x2 and noted to be free of any serosal or full-thickness injuries. ?  ?The rectosigmoid stump was readily identified.  This was retracted.  The uterus was elevated anteriorly.  We then freed this rectosigmoid stump from the left tube and ovary.  There are also some attachments to the right tube/ovary.  These were freed sharply.  ICG is visualized within both ureters and we are well away from this.  They reside lateral to her pelvic inlet in their normal anatomic position. ? ?It is evident that she has a small remnant cuff of sigmoid colon that will need to be removed to facilitate a healthy fresh rectal stump and reduce the risk of recurrent diverticulitis down the road.  The mesentery out to the level of the proximal rectum was then cleared using the vessel sealer.  At this location, the tinea had splayed and there were loss of appendices epiploica.  This clearly anatomically corresponds to the proximal rectum. ? ?The descending colon is then identified.  We are able to  mobilize this all the way up to and partially including the splenic flexure.  The associated mesentery was reflected medially.  There is a visible palpable pulse in the mesentery going up to the abdominal wall.  All omental adhesions to the colon are also taken down out to the level of the colostomy. ? ?The robot is then undocked and I scrubbed back in.  The colostomy was circumferentially incised sharply.  The underlying subcutaneous tissue divided electrocautery.  The fascia was identified and the colostomy was carefully dissected free of this using Metzenbaum scissors.  Just proximal to the ostomy, the mesentery was cleared.  A pursestring device is applied.  A 0 Prolene on a Keith needle was passed.  The remnant colostomy is excised and passed off as specimen. ? ?3-0 silk pursestring sutures were placed around the Prolene suture line.  EEA sizers were passed.  A 29 mm EEA is selected.  The anvil was placed and the pursestring tied.  There is no significant amounts of fat within the planned anastomosis.  There are no diverticula within this either.  There is again a palpable pulse in the mesentery going up towards the planned staple line.  This is placed back in the abdomen.  A wound protector with a cap is placed and pneumoperitoneum reestablished.  A 12 mm port is placed through the wound protector.  The laparoscopic 60 mm green load stapler  was then used to divide the rectosigmoid stump leaving her with a healthy rectal staple line.  The staple line is intact.  The rectosigmoid colon that was just divided is then removed through the wound protector and passed off the specimen. ? ?We turned our attention to performing an intraoperative perfusion assessment test using ICG.  2.5 mg was administered by anesthesia.  There is avid uptake of this within the small bowel as well as the colon out to the anvil.  The rectum is inspected and also noted to have excellent perfusion.  The anvil easily reaches into the deep  pelvis without any tension and remains in that location. ? ?I then went below to pass the stapler.  My partner remained above.  EEA sizers were cautiously introduced via the anus and advanced under direct visualization.  The stapler

## 2021-04-26 ENCOUNTER — Other Ambulatory Visit: Payer: Self-pay

## 2021-04-26 ENCOUNTER — Other Ambulatory Visit (HOSPITAL_COMMUNITY): Payer: Self-pay

## 2021-04-26 ENCOUNTER — Encounter (HOSPITAL_COMMUNITY): Payer: Self-pay | Admitting: Surgery

## 2021-04-26 LAB — CBC
HCT: 45.3 % (ref 36.0–46.0)
Hemoglobin: 15 g/dL (ref 12.0–15.0)
MCH: 32.1 pg (ref 26.0–34.0)
MCHC: 33.1 g/dL (ref 30.0–36.0)
MCV: 96.8 fL (ref 80.0–100.0)
Platelets: 407 10*3/uL — ABNORMAL HIGH (ref 150–400)
RBC: 4.68 MIL/uL (ref 3.87–5.11)
RDW: 12.1 % (ref 11.5–15.5)
WBC: 15 10*3/uL — ABNORMAL HIGH (ref 4.0–10.5)
nRBC: 0 % (ref 0.0–0.2)

## 2021-04-26 LAB — BASIC METABOLIC PANEL
Anion gap: 9 (ref 5–15)
BUN: 7 mg/dL — ABNORMAL LOW (ref 8–23)
CO2: 25 mmol/L (ref 22–32)
Calcium: 9.8 mg/dL (ref 8.9–10.3)
Chloride: 105 mmol/L (ref 98–111)
Creatinine, Ser: 0.6 mg/dL (ref 0.44–1.00)
GFR, Estimated: >60 mL/min (ref >60)
Glucose, Bld: 126 mg/dL — ABNORMAL HIGH (ref 70–99)
Potassium: 3.7 mmol/L (ref 3.5–5.1)
Sodium: 139 mmol/L (ref 135–145)

## 2021-04-26 LAB — SURGICAL PATHOLOGY

## 2021-04-26 MED ORDER — TRAMADOL HCL 50 MG PO TABS
50.0000 mg | ORAL_TABLET | Freq: Four times a day (QID) | ORAL | 0 refills | Status: AC | PRN
  Filled 2021-04-26: qty 15, 4d supply, fill #0

## 2021-04-26 NOTE — Progress Notes (Signed)
?  Subjective ?No acute events. Feeling well. Up in chair. Foley out, voiding. No flatus/BM yet. Pain well controlled. No complaints at present ? ?Objective: ?Vital signs in last 24 hours: ?Temp:  [97.5 ?F (36.4 ?C)-98.9 ?F (37.2 ?C)] 98.9 ?F (37.2 ?C) (04/13 0108) ?Pulse Rate:  [57-82] 57 (04/13 0108) ?Resp:  [12-18] 15 (04/13 0108) ?BP: (110-132)/(46-91) 131/70 (04/13 0108) ?SpO2:  [95 %-100 %] 98 % (04/13 0108) ?  ? ?Intake/Output from previous day: ?04/12 0701 - 04/13 0700 ?In: 3432 [P.O.:350; I.V.:2982; IV Piggyback:100] ?Out: 2840 [Urine:2800; Blood:40] ?Intake/Output this shift: ?Total I/O ?In: -  ?Out: 250 [Urine:250] ? ?Gen: NAD, comfortable ?CV: RRR ?Pulm: Normal work of breathing ?Abd: Soft, NT/ND. Incisions dressed/dry. ?Ext: SCDs in place ? ?Lab Results: ?CBC  ?Recent Labs  ?  04/26/21 ?0428  ?WBC 15.0*  ?HGB 15.0  ?HCT 45.3  ?PLT 407*  ? ?BMET ?Recent Labs  ?  04/26/21 ?0428  ?NA 139  ?K 3.7  ?CL 105  ?CO2 25  ?GLUCOSE 126*  ?BUN 7*  ?CREATININE 0.60  ?CALCIUM 9.8  ? ?PT/INR ?No results for input(s): LABPROT, INR in the last 72 hours. ?ABG ?No results for input(s): PHART, HCO3 in the last 72 hours. ? ?Invalid input(s): PCO2, PO2 ? ?Studies/Results: ? ?Anti-infectives: ?Anti-infectives (From admission, onward)  ? ? Start     Dose/Rate Route Frequency Ordered Stop  ? 04/25/21 0840  sodium chloride 0.9 % with cefoTEtan (CEFOTAN) ADS Med       ?Note to Pharmacy: Helayne [place]: cabinet override  ?    04/25/21 0840 04/25/21 0919  ? ?  ? ? ? ?Assessment/Plan: ?Patient Active Problem List  ? Diagnosis Date Noted  ? S/P colostomy takedown 04/25/2021  ? Unilateral primary osteoarthritis, left hip 04/23/2021  ? Diverticulitis of colon 01/09/2021  ? Status post Gertie Gowda procedure [facility]) 10/30/2020  ? Encounter to establish care 10/30/2020  ? Thrombocytosis 10/19/2020  ? Colostomy in place [facility]) 09/21/2020  ? Anemia 09/21/2020  ? Physical deconditioning 09/21/2020  ? Essential hypertension, benign 10/01/2018  ? HLD  (hyperlipidemia) 10/01/2018  ? Vitamin D deficiency disease 10/01/2018  ? Osteoarthritis 10/01/2018  ? ?s/p Procedure(s): ?XI ROBOTIC ASSISTED COLOSTOMY TAKEDOWN WITH LOW ANTERIOR RESECTION, WITH BILATERAL TAP BLOCK, FLEXIBLE SIGMOIDOSCOPY, AND INTRAOPERATIVE ASSESSMENT OF PERFUSION USING FIREFLY ?LYSIS OF ADHESION ?CYSTOSCOPY WITH BILATERAL FIREFLY INSTILLATION 04/25/2021 ? ?-Full liquids ?-Ambulate 5x/day ?-Abdominal binder prn ?-D/C IVF ?-Advance to soft diet as tolerated ?-Continue Entereg ?-Ppx: SQH, SCDs ? ?Spent time today reviewing her procedure, findings, plans. All questions answered ? ? LOS: 1 day  ? ?Marin Olp, MD FACS ?[facility] ?

## 2021-04-26 NOTE — Discharge Instructions (Signed)
POST OP INSTRUCTIONS AFTER COLON SURGERY ? ?DIET: Be sure to include lots of fluids daily to stay hydrated - 64oz of water per day (8, 8 oz glasses).  Avoid fast food or heavy meals for the first couple of weeks as your are more likely to get nauseated. Avoid raw/uncooked fruits or vegetables for the first 4 weeks (its ok to have these if they are blended into smoothie form). If you have fruits/vegetables, make sure they are cooked until soft enough to mash on the roof of your mouth and chew your food well. Otherwise, diet as tolerated. ? ?Take your usually prescribed home medications unless otherwise directed. ? ?PAIN CONTROL: ?Pain is best controlled by a usual combination of three different methods TOGETHER: ?Ice/Heat ?Over the counter pain medication ?Prescription pain medication ?Most patients will experience some swelling and bruising around the surgical site.  Ice packs or heating pads (30-60 minutes up to 6 times a day) will help. Some people prefer to use ice alone, heat alone, alternating between ice & heat.  Experiment to what works for you.  Swelling and bruising can take several weeks to resolve.   ?It is helpful to take an over-the-counter pain medication regularly for the first few weeks: ?Ibuprofen (Motrin/Advil) - 200mg  tabs - take 3 tabs (600mg ) every 6 hours as needed for pain (unless you have been directed previously to avoid NSAIDs/ibuprofen) ?Acetaminophen (Tylenol) - you may take 650mg  every 6 hours as needed. You can take this with motrin as they act differently on the body. If you are taking a narcotic pain medication that has acetaminophen in it, do not take over the counter tylenol at the same time. ?NOTE: You may take both of these medications together - most patients  find it most helpful when alternating between the two (i.e. Ibuprofen at 6am, tylenol at 9am, ibuprofen at 12pm ...) ?A  prescription for pain medication should be given to you upon discharge.  Take your pain medication as  prescribed if your pain is not adequatly controlled with the over-the-counter pain reliefs mentioned above. ? ?Avoid getting constipated.  Between the surgery and the pain medications, it is common to experience some constipation.  Increasing fluid intake and taking a fiber supplement (such as Metamucil, Citrucel, FiberCon, MiraLax, etc) 1-2 times a day regularly will usually help prevent this problem from occurring.  A mild laxative (prune juice, Milk of Magnesia, MiraLax, etc) should be taken according to package directions if there are no bowel movements after 48 hours.   ? ?Dressing: Your incisions are covered in Dermabond which is like sterile superglue for the skin. This will come off on it's own in a couple weeks. It is waterproof and you may bathe normally starting the day after your surgery in a shower. Avoid baths/pools/lakes/oceans until your wounds have fully healed. Colostomy closure site may be covered daily with dry gauze to protect clothing. No need to 'pack' this wound; it should heal on its own over the next few weeks. Ok to get this wet in the shower with soap and water over the wound ? ?ACTIVITIES as tolerated:   ?Avoid heavy lifting (>10lbs or 1 gallon of milk) for the next 6 weeks. ?You may resume regular daily activities as tolerated--such as daily self-care, walking, climbing stairs--gradually increasing activities as tolerated.  If you can walk 30 minutes without difficulty, it is safe to try more intense activity such as jogging, treadmill, bicycling, low-impact aerobics.  ?DO NOT PUSH THROUGH PAIN.  Let pain  be your guide: If it hurts to do something, don't do it. ?You may drive when you are no longer taking prescription pain medication, you can comfortably wear a seatbelt, and you can safely maneuver your car and apply brakes. ? ?FOLLOW UP in our office ?Please call [facility] at [phone] to set up an appointment to see your surgeon in the office for a follow-up appointment approximately 2  weeks after your surgery. ?Make sure that you call for this appointment the day you arrive home to insure a convenient appointment time. ? ?9. If you have disability or family leave forms that need to be completed, you may have them completed by your primary care physician's office; for return to work instructions, please ask our office staff and they will be happy to assist you in obtaining this documentation ?  ?When to call us [phone]: ?Poor pain control ?Reactions / problems with new medications (rash/itching, etc)  ?Fever over 101.5 F (38.5 C) ?Inability to urinate ?Nausea/vomiting ?Worsening swelling or bruising ?Continued bleeding from incision. ?Increased pain, redness, or drainage from the incision ? ?The clinic staff is available to answer your questions during regular business hours (8:30am-5pm).  Please don?t hesitate to call and ask to speak to one of our nurses for clinical concerns.   A surgeon from [facility] is always on call at the hospitals ?  ?If you have a medical emergency, go to the nearest emergency room or call 911. ? ?[facility], [place] ?[address], [place], [place], [place]  [address] ?MAIN: [phone] ?FAX: [phone] ?[email or web address] ?

## 2021-04-27 ENCOUNTER — Other Ambulatory Visit (HOSPITAL_COMMUNITY): Payer: Self-pay

## 2021-04-27 LAB — BASIC METABOLIC PANEL
Anion gap: 8 (ref 5–15)
BUN: 8 mg/dL (ref 8–23)
CO2: 23 mmol/L (ref 22–32)
Calcium: 9.2 mg/dL (ref 8.9–10.3)
Chloride: 105 mmol/L (ref 98–111)
Creatinine, Ser: 0.71 mg/dL (ref 0.44–1.00)
GFR, Estimated: >60 mL/min (ref >60)
Glucose, Bld: 122 mg/dL — ABNORMAL HIGH (ref 70–99)
Potassium: 3.5 mmol/L (ref 3.5–5.1)
Sodium: 136 mmol/L (ref 135–145)

## 2021-04-27 LAB — CBC
HCT: 39.1 % (ref 36.0–46.0)
Hemoglobin: 13.1 g/dL (ref 12.0–15.0)
MCH: 32.8 pg (ref 26.0–34.0)
MCHC: 33.5 g/dL (ref 30.0–36.0)
MCV: 98 fL (ref 80.0–100.0)
Platelets: 322 10*3/uL (ref 150–400)
RBC: 3.99 MIL/uL (ref 3.87–5.11)
RDW: 12.3 % (ref 11.5–15.5)
WBC: 12 10*3/uL — ABNORMAL HIGH (ref 4.0–10.5)
nRBC: 0 % (ref 0.0–0.2)

## 2021-04-27 NOTE — Discharge Summary (Signed)
Patient ID: ?Mauricio Po ?MRN: [id number] ?DOB/AGE: [birth year] 71 y.o. ? ?Admit date: 04/25/2021 ?Discharge date: 04/27/2021 ? ?Discharge Diagnoses ?Patient Active Problem List  ? Diagnosis Date Noted  ? S/P colostomy takedown 04/25/2021  ? Unilateral primary osteoarthritis, left hip 04/23/2021  ? Diverticulitis of colon 01/09/2021  ? Status post Gertie Gowda procedure [facility]) 10/30/2020  ? Encounter to establish care 10/30/2020  ? Thrombocytosis 10/19/2020  ? Colostomy in place [facility]) 09/21/2020  ? Anemia 09/21/2020  ? Physical deconditioning 09/21/2020  ? Essential hypertension, benign 10/01/2018  ? HLD (hyperlipidemia) 10/01/2018  ? Vitamin D deficiency disease 10/01/2018  ? Osteoarthritis 10/01/2018  ? ? ?Procedures ?Robotic assisted takedown of end colostomy ?Low anterior resection ?Robotic lysis of adhesions x 120 minutes ?Intraoperative assessment of perfusion using ICG fluorescence imaging ?Flexible sigmoidoscopy ?Bilateral transversus abdominus plane (TAP) blocks ? ? ?Hospital Course: She was admitted postoperatively and recovered uneventfully. Her diet was gradually advanced and she tolerated this well. She began having spontaneous bowel function. She mobilized well on her own. On 04/27/21, she is noted to be stable for and comfortable with discharge home. Postoperative expectations reviewed prior to discharge ? ? ? ?Allergies as of 04/27/2021   ?No Known Allergies ?  ? ?  ?Medication List  ?  ? ?TAKE these medications   ? ?acetaminophen 500 MG tablet ?Commonly known as: TYLENOL ?Take 1,000 mg by mouth every 6 (six) hours as needed for moderate pain. ?  ?amLODipine 10 MG tablet ?Commonly known as: NORVASC ?Take 1 tablet by mouth once daily ?  ?gabapentin 100 MG capsule ?Commonly known as: NEURONTIN ?Take 1 capsule (100 mg total) by mouth every 8 (eight) hours as needed. ?  ?Nebivolol HCl 20 MG Tabs ?Take 1 tablet (20 mg total) by mouth daily at 12 noon. ?  ?Salonpas 3.01-20-08 % Ptch ?Generic drug: Camphor-Menthol-Methyl  Sal ?Place 1 patch onto the skin daily as needed (pain). ?  ?tiZANidine 4 MG tablet ?Commonly known as: Zanaflex ?Take 1 tablet (4 mg total) by mouth every 8 (eight) hours as needed for muscle spasms. ?  ?traMADol 50 MG tablet ?Commonly known as: Ultram ?Take 1 tablet (50 mg total) by mouth every 6 (six) hours as needed for up to 5 days (postop pain not controlled with tylenol and ibuprofen first). ?  ? ?  ? ? ? ? ?Stephanie Coup. Cliffton Asters, M.D. ?[facility] ?

## 2021-04-27 NOTE — Progress Notes (Signed)
?  Subjective ?No acute events. Feeling well. Mobilizing well on her own; not interested in PT at this juncture. Lots of flatus, having bms -were bloody yesterday but that has since cleared up. Pain controlled today, was sore last night.  ? ?Objective: ?Vital signs in last 24 hours: ?Temp:  [97.6 ?F (36.4 ?C)-98.7 ?F (37.1 ?C)] 97.8 ?F (36.6 ?C) (04/14 0559) ?Pulse Rate:  [54-68] 54 (04/14 0559) ?Resp:  [14-15] 14 (04/14 0559) ?BP: (93-129)/(61-66) 95/61 (04/14 0559) ?SpO2:  [99 %-100 %] 99 % (04/14 0559) ?Weight:  [54.2 kg] 54.2 kg (04/14 0559) ?Last BM Date : 04/26/21 ? ?Intake/Output from previous day: ?04/13 0701 - 04/14 0700 ?In: 1226.9 [P.O.:1080; I.V.:146.9] ?Out: 1850 [Urine:1850] ?Intake/Output this shift: ?No intake/output data recorded. ? ?Gen: NAD, comfortable ?CV: RRR ?Pulm: Normal work of breathing ?Abd: Soft, NT/ND. Incisions dressed/dry. ?Ext: SCDs in place ? ?Lab Results: ?CBC  ?Recent Labs  ?  04/26/21 ?0428 04/27/21 ?0432  ?WBC 15.0* 12.0*  ?HGB 15.0 13.1  ?HCT 45.3 39.1  ?PLT 407* 322  ? ?BMET ?Recent Labs  ?  04/26/21 ?0428 04/27/21 ?0432  ?NA 139 136  ?K 3.7 3.5  ?CL 105 105  ?CO2 25 23  ?GLUCOSE 126* 122*  ?BUN 7* 8  ?CREATININE 0.60 0.71  ?CALCIUM 9.8 9.2  ? ?PT/INR ?No results for input(s): LABPROT, INR in the last 72 hours. ?ABG ?No results for input(s): PHART, HCO3 in the last 72 hours. ? ?Invalid input(s): PCO2, PO2 ? ?Studies/Results: ? ?Anti-infectives: ?Anti-infectives (From admission, onward)  ? ? Start     Dose/Rate Route Frequency Ordered Stop  ? 04/25/21 0840  sodium chloride 0.9 % with cefoTEtan (CEFOTAN) ADS Med       ?Note to Pharmacy: Helayne [place]: cabinet override  ?    04/25/21 0840 04/25/21 0919  ? ?  ? ? ? ?Assessment/Plan: ?Patient Active Problem List  ? Diagnosis Date Noted  ? S/P colostomy takedown 04/25/2021  ? Unilateral primary osteoarthritis, left hip 04/23/2021  ? Diverticulitis of colon 01/09/2021  ? Status post Gertie Gowda procedure [facility]) 10/30/2020  ? Encounter to  establish care 10/30/2020  ? Thrombocytosis 10/19/2020  ? Colostomy in place [facility]) 09/21/2020  ? Anemia 09/21/2020  ? Physical deconditioning 09/21/2020  ? Essential hypertension, benign 10/01/2018  ? HLD (hyperlipidemia) 10/01/2018  ? Vitamin D deficiency disease 10/01/2018  ? Osteoarthritis 10/01/2018  ? ?s/p Procedure(s): ?XI ROBOTIC ASSISTED COLOSTOMY TAKEDOWN WITH LOW ANTERIOR RESECTION, WITH BILATERAL TAP BLOCK, FLEXIBLE SIGMOIDOSCOPY, AND INTRAOPERATIVE ASSESSMENT OF PERFUSION USING FIREFLY ?LYSIS OF ADHESION ?CYSTOSCOPY WITH BILATERAL FIREFLY INSTILLATION 04/25/2021 ? ?-Diet as tolerated ?-Ambulate 5x/day ?-Abdominal binder prn ?-Will see how she does this morning; may be able to go home later today if doing well ?-Ppx: SQH, SCDs ? ?Spent time today reviewing her procedure, findings, plans. All questions answered ? ? LOS: 2 days  ? ?Marin Olp, MD FACS ?[facility] ?

## 2021-04-27 NOTE — Plan of Care (Signed)

## 2021-04-27 NOTE — Progress Notes (Signed)
Assessment unchanged. Pt and husband verbalized understanding of dc instructions including medications and follow up care. Discharged via wc to front entrance accompanied by NT and husband. 

## 2021-04-27 NOTE — Evaluation (Signed)
Physical Therapy Evaluation ?Patient Details ?Name: Anne Aguirre ?MRN: [id number] ?DOB: 13-Apr-1950 ?Today's Date: 04/27/2021 ? ?History of Present Illness ? Pt s/p lysis of adhesions and colostomy take down.  Pt with hx of colon resection, and colostomy 8/22  ?Clinical Impression ? Pt admitted as above and demonstrating ability to mobilize IND including up to ambulate 250' in hall with [facility].  Pt demonstrating good balance and safety awareness with distance ltd by post op abdominal pain and chronic L hip pain.  Pt states close to baseline and plans THR "in a month or so".  PT service will sign off and defer further mobility to nursing, family and mobility team. ?   ? ?Recommendations for follow up therapy are one component of a multi-disciplinary discharge planning process, led by the attending physician.  Recommendations may be updated based on patient status, additional functional criteria and insurance authorization. ? ?Follow Up Recommendations No PT follow up ? ?  ?Assistance Recommended at Discharge None  ?Patient can return home with the following ?   ? ?  ?Equipment Recommendations None recommended by PT  ?Recommendations for Other Services ?    ?  ?Functional Status Assessment Patient has had a recent decline in their functional status and demonstrates the ability to make significant improvements in function in a reasonable and predictable amount of time.  ? ?  ?Precautions / Restrictions Precautions ?Precautions: None ?Restrictions ?Weight Bearing Restrictions: No ?Other Position/Activity Restrictions: WBAT  ? ?  ? ?Mobility ? Bed Mobility ?Overal bed mobility: Modified Independent ?  ?  ?  ?  ?  ?  ?  ?  ? ?Transfers ?Overall transfer level: Modified independent ?  ?  ?  ?  ?  ?  ?  ?  ?General transfer comment: no physical assist ?  ? ?Ambulation/Gait ?Ambulation/Gait assistance: Min guard, Supervision, Modified independent (Device/Increase time) ?Gait Distance (Feet): 300 Feet ?Assistive device: Straight  cane ?Gait Pattern/deviations: Step-through pattern, Decreased step length - right, Decreased step length - left, Shuffle, Antalgic ?Gait velocity: mod pace ?  ?  ?General Gait Details: Pt ambulating with good stability and good safety awareness.  Using cane 2* arthritic L hip ? ?Stairs ?  ?  ?  ?  ?  ? ?Wheelchair Mobility ?  ? ?Modified Rankin (Stroke Patients Only) ?  ? ?  ? ?Balance Overall balance assessment: Mild deficits observed, not formally tested ?  ?  ?  ?  ?  ?  ?  ?  ?  ?  ?  ?  ?  ?  ?  ?  ?  ?  ?   ? ? ? ?Pertinent Vitals/Pain Pain Assessment ?Pain Assessment: 0-10 ?Pain Score: 4  ?Pain Location: abdomen and L hip 2* OA ?Pain Descriptors / Indicators: Aching, Sore ?Pain Intervention(s): Limited activity within patient's tolerance, Monitored during session, Premedicated before session  ? ? ?Home Living Family/patient expects to be discharged to:: Private residence ?Living Arrangements: Spouse/significant other ?Available Help at Discharge: Family;Available 24 hours/day ?Type of Home: House ?Home Access: Ramped entrance ?  ?  ?  ?Home Layout: One level ?Home Equipment: Rollator (4 wheels);Cane - single point;Grab bars - tub/shower;Grab bars - toilet;BSC/3in1 ?   ?  ?Prior Function Prior Level of Function : Independent/Modified Independent ?  ?  ?  ?  ?  ?  ?Mobility Comments: uses cane for ambulation ?  ?  ? ? ?Hand Dominance  ? Dominant Hand: Right ? ?  ?Extremity/Trunk Assessment  ?  Upper Extremity Assessment ?Upper Extremity Assessment: Overall WFL for tasks assessed ?  ? ?Lower Extremity Assessment ?Lower Extremity Assessment: Overall WFL for tasks assessed ?  ? ?Cervical / Trunk Assessment ?Cervical / Trunk Assessment: Normal  ?Communication  ? Communication: No difficulties  ?Cognition Arousal/Alertness: Awake/alert ?Behavior During Therapy: [facility] for tasks assessed/performed ?Overall Cognitive Status: Within Functional Limits for tasks assessed ?  ?  ?  ?  ?  ?  ?  ?  ?  ?  ?  ?  ?  ?  ?  ?  ?  ?   ?  ? ?  ?General Comments   ? ?  ?Exercises    ? ?Assessment/Plan  ?  ?PT Assessment Patient needs continued PT services  ?PT Problem List Decreased activity tolerance;Pain ? ?   ?  ?PT Treatment Interventions DME instruction;Gait training;Functional mobility training   ? ?PT Goals (Current goals can be found in the Care Plan section)  ?Acute Rehab PT Goals ?Patient Stated Goal: HOME ?PT Goal Formulation: All assessment and education complete, DC therapy ? ?  ?Frequency Min 1X/week ?  ? ? ?Co-evaluation   ?  ?  ?  ?  ? ? ?  ?AM-PAC PT "6 Clicks" Mobility  ?Outcome Measure Help needed turning from your back to your side while in a flat bed without using bedrails?: None ?Help needed moving from lying on your back to sitting on the side of a flat bed without using bedrails?: None ?Help needed moving to and from a bed to a chair (including a wheelchair)?: None ?Help needed standing up from a chair using your arms (e.g., wheelchair or bedside chair)?: None ?Help needed to walk in hospital room?: None ?Help needed climbing 3-5 steps with a railing? : A Little ?6 Click Score: 23 ? ?  ?End of Session Equipment Utilized During Treatment: Gait belt ?Activity Tolerance: Patient tolerated treatment well ?Patient left: in chair;with call bell/phone within reach ?Nurse Communication: Mobility status ?PT Visit Diagnosis: Difficulty in walking, not elsewhere classified (R26.2) ?  ? ?Time: [id number] ?PT Time Calculation (min) (ACUTE ONLY): 13 min ? ? ?Charges:   PT Evaluation ?$PT Eval Low Complexity: 1 Low ?  ?  ?   ? ? ?Debe Coder PT ?Acute Rehabilitation Services ?Pager [phone] ?Office [phone] ? ? ?Avion Patella ?04/27/2021, 12:52 PM ? ?

## 2021-05-01 ENCOUNTER — Telehealth: Payer: Self-pay | Admitting: *Deleted

## 2021-05-01 NOTE — Telephone Encounter (Signed)
Transition Care Management Follow-up Telephone Call ?Date of discharge and from where: 04-27-21 [facility]  ?How have you been since you were released from the hospital? Good  ?Any questions or concerns? Yes ? ?Items Reviewed: ?Did the pt receive and understand the discharge instructions provided? Yes  ?Medications obtained and verified? Yes  ?Other? No  ?Any new allergies since your discharge? Yes  ?Dietary orders reviewed? Yes ?Do you have support at home? Yes  ? ?Home Care and Equipment/Supplies: ?Were home health services ordered? not applicable ?If so, what is the name of the agency? NA  ?Has the agency set up a time to come to the patient's home? not applicable ?Were any new equipment or medical supplies ordered?  No ?What is the name of the medical supply agency? NA   ?Were you able to get the supplies/equipment? not applicable ?Do you have any questions related to the use of the equipment or supplies? No ? ?Functional Questionnaire: (I = Independent and D = Dependent) ?ADLs: i ? ?Bathing/Dressing- i ? ?Meal Prep- i ? ?Eating- i ? ?Maintaining continence- i ? ?Transferring/Ambulation- i ? ?Managing Meds- i ? ?Follow up appointments reviewed: ? ?PCP Hospital f/u appt confirmed? Yes  Scheduled to see Allena Katz on 05-14-21 @ 1:00. ?Specialist Hospital f/u appt confirmed? Yes  Scheduled to see  surgeon  ?Are transportation arrangements needed? Yes  ?If their condition worsens, is the pt aware to call PCP or go to the Emergency Dept.? Yes ?Was the patient provided with contact information for the PCP's office or ED? Yes ?Was to pt encouraged to call back with questions or concerns? Yes ? ?

## 2021-05-14 ENCOUNTER — Encounter: Payer: Self-pay | Admitting: Internal Medicine

## 2021-05-14 ENCOUNTER — Ambulatory Visit: Payer: Medicare PPO | Admitting: Internal Medicine

## 2021-05-14 VITALS — BP 118/78 | HR 67 | Resp 18 | Ht 65.0 in | Wt 124.8 lb

## 2021-05-14 DIAGNOSIS — Z9889 Other specified postprocedural states: Secondary | ICD-10-CM

## 2021-05-14 DIAGNOSIS — R5381 Other malaise: Secondary | ICD-10-CM

## 2021-05-14 DIAGNOSIS — Z09 Encounter for follow-up examination after completed treatment for conditions other than malignant neoplasm: Secondary | ICD-10-CM | POA: Diagnosis not present

## 2021-05-14 DIAGNOSIS — I10 Essential (primary) hypertension: Secondary | ICD-10-CM | POA: Diagnosis not present

## 2021-05-14 MED ORDER — AMLODIPINE BESYLATE 10 MG PO TABS: 10.0000 mg | ORAL_TABLET | Freq: Every day | ORAL | 1 refills | Status: DC

## 2021-05-14 MED ORDER — NEBIVOLOL HCL 20 MG PO TABS: 20.0000 mg | ORAL_TABLET | Freq: Every day | ORAL | 1 refills | Status: DC

## 2021-05-14 NOTE — Assessment & Plan Note (Addendum)
Has chronic left hip pain and chronic low back pain, followed by orthopedic surgery ?Has a cane for walking support ?

## 2021-05-14 NOTE — Assessment & Plan Note (Signed)
BP Readings from Last 1 Encounters:  ?05/14/21 118/78  ? ?Well-controlled with amlodipine and nebivolol ?Counseled for compliance with the medications ?Advised DASH diet and moderate exercise/walking, at least 150 mins/week ? ?

## 2021-05-14 NOTE — Assessment & Plan Note (Addendum)
Hospital chart reviewed, including discharge summary ?S/p colostomy takedown, has regular BMs now ?Medications reconciled and reviewed with the patient ?

## 2021-05-14 NOTE — Assessment & Plan Note (Signed)
Had colon resection and colostomy  placement as complication from acute diverticulitis ?S/p robotic assisted colostomy takedown, followed by general surgery ?

## 2021-05-14 NOTE — Progress Notes (Signed)
? ?Established Patient Office Visit ? ?Subjective:  ?Patient ID: Anne Aguirre, female    DOB: October 03, 1950  Age: 71 y.o. MRN: [id number] ? ?CC:  ?Chief Complaint  ?Patient presents with  ? Transitions Of Care  ?  Pt had colostomy reversal has been feeing fine since being home discharged 04-27-21  ? ? ?HPI ?Anne Aguirre is a 71 y.o. female with past medical history of HTN, diverticulitis s/p colon resection and colostomy placement who presents for f/u after recent hospitalization for colostomy takedown. ? ?She had robotic assisted colostomy takedown on 04/12 and was admitted in the hospital for 2 days after it.  She was discharged on 04/14.  She had a outpatient follow-up visit with general surgeon this morning.  She denies any fever, chills, nausea, vomiting, diarrhea, melena, hematochezia.  Her incision site is healing well.  Denies any bleeding or abnormal discharge from the site. ? ?Past Medical History:  ?Diagnosis Date  ? Acute diverticulitis 08/23/2020  ? Anemia   ? COVID-19 virus infection ---- +ve on 09/01/2020 09/10/2020  ? Essential hypertension, benign 10/01/2018  ? HLD (hyperlipidemia) 10/01/2018  ? Osteoarthritis 10/01/2018  ? Post-op intraabdominal/Liver abscess-Had Hartmann's with Colostomy on 08/29/2020 for Sigmoid Diverticulits 09/10/2020  ? s/p ex lap and Hartman's procedure with colostomy by [facility] 08/29/20 for diverticulitis with abscess-- Now with Liver/intrabdominal abscess---  ? Sepsis Due to Intraabdomonal Abscess 09/10/2020  ? Vitamin D deficiency disease 10/01/2018  ? ? ?Past Surgical History:  ?Procedure Laterality Date  ? COLON RESECTION N/A 08/29/2020  ? Procedure: COLON RESECTION;  Surgeon: Axel Filler, MD;  Location: [facility] OR;  Service: General;  Laterality: N/A;  ? COLOSTOMY N/A 08/29/2020  ? Procedure: COLOSTOMY;  Surgeon: Axel Filler, MD;  Location: [facility];  Service: General;  Laterality: N/A;  ? LAPAROTOMY N/A 08/29/2020  ? Procedure: EXPLORATORY LAPAROTOMY;  Surgeon: Axel Filler,  MD;  Location: [facility] OR;  Service: General;  Laterality: N/A;  ? LYSIS OF ADHESION N/A 04/25/2021  ? Procedure: LYSIS OF ADHESION;  Surgeon: Andria Meuse, MD;  Location: [facility] ORS;  Service: General;  Laterality: N/A;  ? TONSILLECTOMY Bilateral   ? XI ROBOTIC ASSISTED COLOSTOMY TAKEDOWN N/A 04/25/2021  ? Procedure: XI ROBOTIC ASSISTED COLOSTOMY TAKEDOWN WITH LOW ANTERIOR RESECTION, WITH BILATERAL TAP BLOCK, FLEXIBLE SIGMOIDOSCOPY, AND INTRAOPERATIVE ASSESSMENT OF PERFUSION USING FIREFLY;  Surgeon: Andria Meuse, MD;  Location: [facility] ORS;  Service: General;  Laterality: N/A;  ? ? ?Family History  ?Problem Relation Age of Onset  ? Hypertension Mother   ? Stroke Mother   ? Stroke Father   ? Hypertension Brother   ? Hypertension Son   ? ? ?Social History  ? ?Socioeconomic History  ? Marital status: Married  ?  Spouse name: [name]  ? Number of children: 2  ? Years of education: Not on file  ? Highest education level: Not on file  ?Occupational History  ? Not on file  ?Tobacco Use  ? Smoking status: Never  ? Smokeless tobacco: Never  ?Vaping Use  ? Vaping Use: Never used  ?Substance and Sexual Activity  ? Alcohol use: Yes  ?  Alcohol/week: 7.0 standard drinks  ?  Types: 7 Glasses of wine per week  ?  Comment: occasional wine  ? Drug use: Never  ? Sexual activity: Not on file  ?Other Topics Concern  ? Not on file  ?Social History Narrative  ? 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  ? ?Social Determinants of Health  ? ?Physicist, medical  Strain: Low Risk   ? Difficulty of Paying Living Expenses: Not hard at all  ?Food Insecurity: No Food Insecurity  ? Worried About Charity fundraiser in the Last Year: Never true  ? Ran Out of Food in the Last Year: Never true  ?Transportation Needs: No Transportation Needs  ? Lack of Transportation (Medical): No  ? Lack of Transportation (Non-Medical): No  ?Physical Activity: Sufficiently Active  ? Days of Exercise per Week: 5 days  ? Minutes of Exercise per Session:  30 min  ?Stress: No Stress Concern Present  ? Feeling of Stress : Not at all  ?Social Connections: Moderately Isolated  ? Frequency of Communication with Friends and Family: More than three times a week  ? Frequency of Social Gatherings with Friends and Family: More than three times a week  ? Attends Religious Services: Never  ? Active Member of Clubs or Organizations: No  ? Attends Archivist Meetings: Never  ? Marital Status: Married  ?Intimate Partner Violence: Not At Risk  ? Fear of Current or Ex-Partner: No  ? Emotionally Abused: No  ? Physically Abused: No  ? Sexually Abused: No  ? ? ?Outpatient Medications Prior to Visit  ?Medication Sig Dispense Refill  ? acetaminophen (TYLENOL) 500 MG tablet Take 1,000 mg by mouth every 6 (six) hours as needed for moderate pain.    ? Camphor-Menthol-Methyl Sal (SALONPAS) 3.01-20-08 % PTCH Place 1 patch onto the skin daily as needed (pain).    ? gabapentin (NEURONTIN) 100 MG capsule Take 1 capsule (100 mg total) by mouth every 8 (eight) hours as needed. 90 capsule 2  ? tiZANidine (ZANAFLEX) 4 MG tablet Take 1 tablet (4 mg total) by mouth every 8 (eight) hours as needed for muscle spasms. 60 tablet 1  ? amLODipine (NORVASC) 10 MG tablet Take 1 tablet by mouth once daily 90 tablet 0  ? Nebivolol HCl 20 MG TABS Take 1 tablet (20 mg total) by mouth daily at 12 noon. 90 tablet 0  ? ?No facility-administered medications prior to visit.  ? ? ?No Known Allergies ? ?ROS ?Review of Systems  ?Constitutional:  Negative for chills and fever.  ?HENT:  Negative for congestion, sinus pressure, sinus pain and sore throat.   ?Eyes:  Negative for pain and discharge.  ?Respiratory:  Negative for cough and shortness of breath.   ?Cardiovascular:  Negative for chest pain and palpitations.  ?Gastrointestinal:  Negative for diarrhea, nausea and vomiting.  ?Endocrine: Negative for polydipsia and polyuria.  ?Genitourinary:  Negative for dysuria and hematuria.  ?Musculoskeletal:  Negative for  neck pain and neck stiffness.  ?Skin:  Negative for rash.  ?Neurological:  Negative for dizziness and weakness.  ?Psychiatric/Behavioral:  Negative for agitation and behavioral problems.   ? ?  ?Objective:  ?  ?Physical Exam ?Vitals reviewed.  ?Constitutional:   ?   General: She is not in acute distress. ?   Appearance: She is not diaphoretic.  ?HENT:  ?   Head: Normocephalic and atraumatic.  ?   Nose: Nose normal.  ?   Mouth/Throat:  ?   Mouth: Mucous membranes are moist.  ?Eyes:  ?   General: No scleral icterus. ?   Extraocular Movements: Extraocular movements intact.  ?Cardiovascular:  ?   Rate and Rhythm: Normal rate and regular rhythm.  ?   Pulses: Normal pulses.  ?   Heart sounds: Normal heart sounds. No murmur heard. ?Pulmonary:  ?   Breath sounds: Normal breath sounds.  No wheezing or rales.  ?Abdominal:  ?   Palpations: Abdomen is soft.  ?   Tenderness: There is no abdominal tenderness.  ?   Comments: Incision site C/D/I  ?Musculoskeletal:  ?   Cervical back: Neck supple. No tenderness.  ?   Right lower leg: No edema.  ?   Left lower leg: No edema.  ?Skin: ?   General: Skin is warm.  ?   Findings: No rash.  ?Neurological:  ?   General: No focal deficit present.  ?   Mental Status: She is alert and oriented to person, place, and time.  ?   Sensory: No sensory deficit.  ?   Motor: No weakness.  ?Psychiatric:     ?   Mood and Affect: Mood normal.     ?   Behavior: Behavior normal.  ? ? ?BP 118/78 (BP Location: Left Arm, Patient Position: Sitting, Cuff Size: Normal)   Pulse 67   Resp 18   Ht 5\' 5"  (1.651 m)   Wt 124 lb 12.8 oz (56.6 kg)   SpO2 98%   BMI 20.77 kg/m?  ?Wt Readings from Last 3 Encounters:  ?05/14/21 124 lb 12.8 oz (56.6 kg)  ?04/27/21 119 lb 7.8 oz (54.2 kg)  ?04/23/21 125 lb (56.7 kg)  ? ? ?Lab Results  ?Component Value Date  ? TSH 2.72 03/20/2020  ? ?Lab Results  ?Component Value Date  ? WBC 12.0 (H) 04/27/2021  ? HGB 13.1 04/27/2021  ? HCT 39.1 04/27/2021  ? MCV 98.0 04/27/2021  ? PLT 322  04/27/2021  ? ?Lab Results  ?Component Value Date  ? NA 136 04/27/2021  ? K 3.5 04/27/2021  ? CO2 23 04/27/2021  ? GLUCOSE 122 (H) 04/27/2021  ? BUN 8 04/27/2021  ? CREATININE 0.71 04/27/2021  ? BILITOT 0.3 09

## 2021-05-14 NOTE — Patient Instructions (Signed)
Please continue taking medications as prescribed. ? ?Please continue to follow low salt and ambulate as tolerated. ?

## 2021-05-15 ENCOUNTER — Encounter: Payer: Self-pay | Admitting: *Deleted

## 2021-05-18 ENCOUNTER — Other Ambulatory Visit: Payer: Self-pay | Admitting: Orthopedic Surgery

## 2021-05-18 ENCOUNTER — Other Ambulatory Visit: Payer: Self-pay

## 2021-05-21 ENCOUNTER — Other Ambulatory Visit: Payer: Self-pay | Admitting: Orthopedic Surgery

## 2021-05-21 MED ORDER — TIZANIDINE HCL 4 MG PO TABS: 4.0000 mg | ORAL_TABLET | Freq: Three times a day (TID) | ORAL | 0 refills | Status: DC

## 2021-06-04 ENCOUNTER — Telehealth: Payer: Self-pay

## 2021-06-04 NOTE — Telephone Encounter (Signed)
Patient called and left message stating that she is has surgery coming up and would like for Dr. Romeo Apple to prescribe her something to help her sleep. She was referred to Dr. Magnus Ivan.    PATIENT USES [facility]

## 2021-06-05 NOTE — Telephone Encounter (Signed)
No   I do not do sleeping pills

## 2021-06-15 ENCOUNTER — Other Ambulatory Visit: Payer: Self-pay | Admitting: Orthopedic Surgery

## 2021-06-15 MED ORDER — TIZANIDINE HCL 4 MG PO TABS: 4.0000 mg | ORAL_TABLET | Freq: Three times a day (TID) | ORAL | 0 refills | Status: DC

## 2021-06-18 NOTE — Progress Notes (Addendum)
DUE TO COVID-19 ONLY  2  VISITOR IS ALLOWED TO COME WITH YOU AND STAY IN THE WAITING ROOM ONLY DURING PRE OP AND PROCEDURE DAY OF SURGERY.   4 VISITOR  MAY VISIT WITH YOU AFTER SURGERY IN YOUR PRIVATE ROOM DURING VISITING HOURS ONLY! YOU MAY HAVE ONE PERSON SPEND THE NITE WITH YOU IN YOUR ROOM AFTER SURGERY.       Your procedure is scheduled on:       06/29/2021   Report to [facility] Main  Entrance   Report to admitting at     0515            AM DO NOT BRING INSURANCE CARD, PICTURE ID OR WALLET DAY OF SURGERY.      Call this number if you have problems the morning of surgery [phone]    REMEMBER: NO  SOLID FOODS , CANDY, GUM OR MINTS AFTER MIDNITE THE NITE BEFORE SURGERY .       Marland Kitchen CLEAR LIQUIDS UNTIL   0415 am              DAY OF SURGERY.    Please complete Ensure presurgery dirnk by  am morning of surgery.    CLEAR LIQUID DIET   Foods Allowed      WATER BLACK COFFEE ( SUGAR OK, NO MILK, CREAM OR CREAMER) REGULAR AND DECAF  TEA ( SUGAR OK NO MILK, CREAM, OR CREAMER) REGULAR AND DECAF  PLAIN JELLO ( NO RED)  FRUIT ICES ( NO RED, NO FRUIT PULP)  POPSICLES ( NO RED)  JUICE- APPLE, WHITE GRAPE AND WHITE CRANBERRY  SPORT DRINK LIKE GATORADE ( NO RED)  CLEAR BROTH ( VEGETABLE , CHICKEN OR BEEF)                                                                     BRUSH YOUR TEETH MORNING OF SURGERY AND RINSE YOUR MOUTH OUT, NO CHEWING GUM CANDY OR MINTS.     Take these medicines the morning of surgery with A SIP OF WATER:  amlodipine, nebivolol    DO NOT TAKE ANY DIABETIC MEDICATIONS DAY OF YOUR SURGERY                               You may not have any metal on your body including hair pins and              piercings  Do not wear jewelry, make-up, lotions, powders or perfumes, deodorant             Do not wear nail polish on your fingernails.              IF YOU ARE A FEMALE AND WANT TO SHAVE UNDER ARMS OR LEGS PRIOR TO SURGERY YOU MUST DO SO AT LEAST 48 HOURS  PRIOR TO SURGERY.              Men may shave face and neck.   Do not bring valuables to the hospital. [facility] IS NOT             RESPONSIBLE   FOR VALUABLES.  Contacts, dentures or bridgework may not be worn into surgery.  Leave suitcase in the car. After surgery it may be brought to your room.     Patients discharged the day of surgery will not be allowed to drive home. IF YOU ARE HAVING SURGERY AND GOING HOME THE SAME DAY, YOU MUST HAVE AN ADULT TO DRIVE YOU HOME AND BE WITH YOU FOR 24 HOURS. YOU MAY GO HOME BY TAXI OR UBER OR ORTHERWISE, BUT AN ADULT MUST ACCOMPANY YOU HOME AND STAY WITH YOU FOR 24 HOURS.                Please read over the following fact sheets you were given: _____________________________________________________________________  [facility] - Preparing for Surgery Before surgery, you can play an important role.  Because skin is not sterile, your skin needs to be as free of germs as possible.  You can reduce the number of germs on your skin by washing with CHG (chlorahexidine gluconate) soap before surgery.  CHG is an antiseptic cleaner which kills germs and bonds with the skin to continue killing germs even after washing. Please DO NOT use if you have an allergy to CHG or antibacterial soaps.  If your skin becomes reddened/irritated stop using the CHG and inform your nurse when you arrive at Short Stay. Do not shave (including legs and underarms) for at least 48 hours prior to the first CHG shower.  You may shave your face/neck. Please follow these instructions carefully:  1.  Shower with CHG Soap the night before surgery and the  morning of Surgery.  2.  If you choose to wash your hair, wash your hair first as usual with your  normal  shampoo.  3.  After you shampoo, rinse your hair and body thoroughly to remove the  shampoo.                           4.  Use CHG as you would any other liquid soap.  You can apply chg directly  to the skin and wash                        Gently with a scrungie or clean washcloth.  5.  Apply the CHG Soap to your body ONLY FROM THE NECK DOWN.   Do not use on face/ open                           Wound or open sores. Avoid contact with eyes, ears mouth and genitals (private parts).                       Wash face,  Genitals (private parts) with your normal soap.             6.  Wash thoroughly, paying special attention to the area where your surgery  will be performed.  7.  Thoroughly rinse your body with warm water from the neck down.  8.  DO NOT shower/wash with your normal soap after using and rinsing off  the CHG Soap.                9.  Pat yourself dry with a clean towel.            10.  Wear clean pajamas.            11.  Place clean sheets on your bed the night of your first  shower and do not  sleep with pets. Day of Surgery : Do not apply any lotions/deodorants the morning of surgery.  Please wear clean clothes to the hospital/surgery center.  FAILURE TO FOLLOW THESE INSTRUCTIONS MAY RESULT IN THE CANCELLATION OF YOUR SURGERY PATIENT SIGNATURE_________________________________  NURSE SIGNATURE__________________________________  ________________________________________________________________________

## 2021-06-18 NOTE — Progress Notes (Signed)
Anesthesia Review:  PCP: Trena Platt LOV 05/14/21  Cardiologist : Chest x-ray : 09/10/20- 1view  EKG : 09/11/20  Vascular Studies- 09/07/20  Echo : Stress test: Cardiac Cath :  Activity level:  Sleep Study/ CPAP : Fasting Blood Sugar :      / Checks Blood Sugar -- times a day:   Blood Thinner/ Instructions /Last Dose: ASA / Instructions/ Last Dose :   5/1/23[name] procedure

## 2021-06-20 ENCOUNTER — Encounter (HOSPITAL_COMMUNITY): Payer: Self-pay

## 2021-06-20 ENCOUNTER — Other Ambulatory Visit: Payer: Self-pay

## 2021-06-20 ENCOUNTER — Encounter (HOSPITAL_COMMUNITY)
Admission: RE | Admit: 2021-06-20 | Discharge: 2021-06-20 | Disposition: A | Discharge status: Home / Self Care | Payer: Medicare PPO | Source: Ambulatory Visit | Attending: Orthopaedic Surgery | Admitting: Orthopaedic Surgery

## 2021-06-20 ENCOUNTER — Other Ambulatory Visit: Payer: Self-pay | Admitting: Physician Assistant

## 2021-06-20 VITALS — BP 110/70 | HR 69 | Temp 98.6°F | Resp 16 | Ht 65.0 in | Wt 128.0 lb

## 2021-06-20 DIAGNOSIS — Z01818 Encounter for other preprocedural examination: Secondary | ICD-10-CM

## 2021-06-20 DIAGNOSIS — Z01812 Encounter for preprocedural laboratory examination: Secondary | ICD-10-CM | POA: Diagnosis not present

## 2021-06-20 DIAGNOSIS — M1612 Unilateral primary osteoarthritis, left hip: Secondary | ICD-10-CM

## 2021-06-20 LAB — CBC
HCT: 37.9 % (ref 36.0–46.0)
Hemoglobin: 13.2 g/dL (ref 12.0–15.0)
MCH: 32.4 pg (ref 26.0–34.0)
MCHC: 34.8 g/dL (ref 30.0–36.0)
MCV: 93.1 fL (ref 80.0–100.0)
Platelets: 322 10*3/uL (ref 150–400)
RBC: 4.07 MIL/uL (ref 3.87–5.11)
RDW: 11.6 % (ref 11.5–15.5)
WBC: 10.4 10*3/uL (ref 4.0–10.5)
nRBC: 0 % (ref 0.0–0.2)

## 2021-06-20 LAB — BASIC METABOLIC PANEL
Anion gap: 8 (ref 5–15)
BUN: 20 mg/dL (ref 8–23)
CO2: 24 mmol/L (ref 22–32)
Calcium: 10.2 mg/dL (ref 8.9–10.3)
Chloride: 103 mmol/L (ref 98–111)
Creatinine, Ser: 0.66 mg/dL (ref 0.44–1.00)
GFR, Estimated: >60 mL/min (ref >60)
Glucose, Bld: 114 mg/dL — ABNORMAL HIGH (ref 70–99)
Potassium: 4.4 mmol/L (ref 3.5–5.1)
Sodium: 135 mmol/L (ref 135–145)

## 2021-06-20 LAB — SURGICAL PCR SCREEN
MRSA, PCR: NEGATIVE
Staphylococcus aureus: NEGATIVE

## 2021-06-25 ENCOUNTER — Other Ambulatory Visit: Payer: Self-pay | Admitting: Orthopedic Surgery

## 2021-06-25 MED ORDER — GABAPENTIN 100 MG PO CAPS: ORAL_CAPSULE | ORAL | 0 refills | Status: DC

## 2021-06-25 NOTE — Progress Notes (Signed)
Meds ordered this encounter  Medications   gabapentin (NEURONTIN) 100 MG capsule    Sig: TAKE 1 CAPSULE BY MOUTH EVERY 8 HOURS AS NEEDED    Dispense:  90 capsule    Refill:  0

## 2021-06-27 ENCOUNTER — Telehealth: Payer: Self-pay | Admitting: *Deleted

## 2021-06-27 NOTE — Telephone Encounter (Signed)
Ortho bundle pre-op call completed. 

## 2021-06-27 NOTE — Care Plan (Signed)
[facility] RNCM call to patient prior to her upcoming Left total hip replacement with Dr. Magnus Ivan on 06/29/21. She is an Ortho bundle patient through [facility] and is agreeable to case management. She lives with a spouse, who will be assisting her at discharge. She has a RW and a BSC/3in1. No DME needed. Anticipate HHPT will be needed after a short hospital stay. Referral made to [facility] after choice provided. Reviewed all post op care instructions. Will continue to follow for needs.

## 2021-06-28 NOTE — H&P (Signed)
TOTAL HIP ADMISSION H&P  Patient is admitted for left total hip arthroplasty.  Subjective:  Chief Complaint: left hip pain  HPI: Anne Aguirre, 71 y.o. female, has a history of pain and functional disability in the left hip(s) due to arthritis and patient has failed non-surgical conservative treatments for greater than 12 weeks to include NSAID's and/or analgesics, use of assistive devices, and activity modification.  Onset of symptoms was gradual starting 3 years ago with gradually worsening course since that time.The patient noted no past surgery on the left hip(s).  Patient currently rates pain in the left hip at 10 out of 10 with activity. Patient has night pain, worsening of pain with activity and weight bearing, trendelenberg gait, pain that interfers with activities of daily living, and pain with passive range of motion. Patient has evidence of subchondral sclerosis, periarticular osteophytes, and joint space narrowing by imaging studies. This condition presents safety issues increasing the risk of falls.  There is no current active infection.  Patient Active Problem List   Diagnosis Date Noted   Hospital discharge follow-up 05/14/2021   S/P colostomy takedown 04/25/2021   Unilateral primary osteoarthritis, left hip 04/23/2021   Diverticulitis of colon 01/09/2021   Status post Gertie Gowda procedure [facility]) 10/30/2020   Thrombocytosis 10/19/2020   Colostomy in place [facility]) 09/21/2020   Anemia 09/21/2020   Physical deconditioning 09/21/2020   Essential hypertension, benign 10/01/2018   HLD (hyperlipidemia) 10/01/2018   Vitamin D deficiency disease 10/01/2018   Osteoarthritis 10/01/2018   Past Medical History:  Diagnosis Date   Acute diverticulitis 08/23/2020   Anemia    COVID-19 virus infection ---- +ve on 09/01/2020 09/10/2020   Essential hypertension, benign 10/01/2018   HLD (hyperlipidemia) 10/01/2018   Osteoarthritis 10/01/2018   Post-op intraabdominal/Liver abscess-Had Hartmann's  with Colostomy on 08/29/2020 for Sigmoid Diverticulits 09/10/2020   s/p ex lap and Hartman's procedure with colostomy by [facility] 08/29/20 for diverticulitis with abscess-- Now with Liver/intrabdominal abscess---   Sepsis Due to Intraabdomonal Abscess 09/10/2020   Vitamin D deficiency disease 10/01/2018    Past Surgical History:  Procedure Laterality Date   COLON RESECTION N/A 08/29/2020   Procedure: COLON RESECTION;  Surgeon: Axel Filler, MD;  Location: [facility] OR;  Service: General;  Laterality: N/A;   COLOSTOMY N/A 08/29/2020   Procedure: COLOSTOMY;  Surgeon: Axel Filler, MD;  Location: [facility] OR;  Service: General;  Laterality: N/A;   LAPAROTOMY N/A 08/29/2020   Procedure: EXPLORATORY LAPAROTOMY;  Surgeon: Axel Filler, MD;  Location: [facility] OR;  Service: General;  Laterality: N/A;   LYSIS OF ADHESION N/A 04/25/2021   Procedure: LYSIS OF ADHESION;  Surgeon: Andria Meuse, MD;  Location: [facility] ORS;  Service: General;  Laterality: N/A;   TONSILLECTOMY Bilateral    XI ROBOTIC ASSISTED COLOSTOMY TAKEDOWN N/A 04/25/2021   Procedure: XI ROBOTIC ASSISTED COLOSTOMY TAKEDOWN WITH LOW ANTERIOR RESECTION, WITH BILATERAL TAP BLOCK, FLEXIBLE SIGMOIDOSCOPY, AND INTRAOPERATIVE ASSESSMENT OF PERFUSION USING FIREFLY;  Surgeon: Andria Meuse, MD;  Location: [facility] ORS;  Service: General;  Laterality: N/A;    No current facility-administered medications for this encounter.   Current Outpatient Medications  Medication Sig Dispense Refill Last Dose   acetaminophen (TYLENOL) 500 MG tablet Take 1,000 mg by mouth in the morning and at bedtime.      amLODipine (NORVASC) 10 MG tablet Take 1 tablet (10 mg total) by mouth daily. 90 tablet 1    Camphor-Menthol-Methyl Sal (SALONPAS) 3.01-20-08 % PTCH Place 1 patch onto the skin daily as needed (pain).  Multiple Vitamin (MULTIVITAMIN WITH MINERALS) TABS tablet Take 1 tablet by mouth daily.      Nebivolol HCl 20 MG TABS Take 1 tablet (20 mg total) by mouth daily at  12 noon. 90 tablet 1    gabapentin (NEURONTIN) 100 MG capsule TAKE 1 CAPSULE BY MOUTH EVERY 8 HOURS AS NEEDED 90 capsule 0    tiZANidine (ZANAFLEX) 4 MG tablet Take 1 tablet (4 mg total) by mouth 3 (three) times daily. (Patient not taking: Reported on 06/18/2021) 60 tablet 0 Not Taking   No Known Allergies  Social History   Tobacco Use   Smoking status: Never   Smokeless tobacco: Never  Substance Use Topics   Alcohol use: Yes    Alcohol/week: 7.0 standard drinks of alcohol    Types: 7 Glasses of wine per week    Comment: occasional wine    Family History  Problem Relation Age of Onset   Hypertension Mother    Stroke Mother    Stroke Father    Hypertension Brother    Hypertension Son      Review of Systems  Musculoskeletal:  Positive for gait problem.  All other systems reviewed and are negative.   Objective:  Physical Exam Vitals reviewed.  Constitutional:      Appearance: Normal appearance.  HENT:     Head: Normocephalic and atraumatic.  Eyes:     Extraocular Movements: Extraocular movements intact.     Pupils: Pupils are equal, round, and reactive to light.  Cardiovascular:     Rate and Rhythm: Normal rate and regular rhythm.     Pulses: Normal pulses.  Pulmonary:     Effort: Pulmonary effort is normal.     Breath sounds: Normal breath sounds.  Musculoskeletal:     Cervical back: Normal range of motion and neck supple.     Left hip: Tenderness and bony tenderness present. Decreased range of motion. Decreased strength.  Neurological:     Mental Status: She is alert and oriented to person, place, and time.  Psychiatric:        Behavior: Behavior normal.     Vital signs in last 24 hours:    Labs:   Estimated body mass index is 21.3 kg/m as calculated from the following:   Height as of 06/20/21: 5\' 5"  (1.651 m).   Weight as of 06/20/21: 58.1 kg.   Imaging Review Plain radiographs demonstrate severe degenerative joint disease of the left hip(s). The bone  quality appears to be excellent for age and reported activity level.      Assessment/Plan:  End stage arthritis, left hip(s)  The patient history, physical examination, clinical judgement of the provider and imaging studies are consistent with end stage degenerative joint disease of the left hip(s) and total hip arthroplasty is deemed medically necessary. The treatment options including medical management, injection therapy, arthroscopy and arthroplasty were discussed at length. The risks and benefits of total hip arthroplasty were presented and reviewed. The risks due to aseptic loosening, infection, stiffness, dislocation/subluxation,  thromboembolic complications and other imponderables were discussed.  The patient acknowledged the explanation, agreed to proceed with the plan and consent was signed. Patient is being admitted for inpatient treatment for surgery, pain control, PT, OT, prophylactic antibiotics, VTE prophylaxis, progressive ambulation and ADL's and discharge planning.The patient is planning to be discharged home with home health services

## 2021-06-28 NOTE — Anesthesia Preprocedure Evaluation (Addendum)
Anesthesia Evaluation  Patient identified by MRN, date of birth, ID band Patient awake    Reviewed: Allergy & Precautions, NPO status , Patient's Chart, lab work & pertinent test results  Airway Mallampati: II  TM Distance: >3 FB Neck ROM: Full    Dental no notable dental hx. (+) Caps, Dental Advisory Given   Pulmonary neg pulmonary ROS,    Pulmonary exam normal breath sounds clear to auscultation       Cardiovascular hypertension, Pt. on medications Normal cardiovascular exam Rhythm:Regular Rate:Normal     Neuro/Psych negative neurological ROS  negative psych ROS   GI/Hepatic negative GI ROS,   Endo/Other  negative endocrine ROS  Renal/GU Lab Results      Component                Value               Date                     NA                       135                 06/20/2021                K                        4.4                 06/20/2021                     Musculoskeletal  (+) Arthritis , Osteoarthritis,    Abdominal   Peds  Hematology Lab Results      Component                Value               Date                      WBC                      10.4                06/20/2021                HGB                      13.2                06/20/2021                HCT                      37.9                06/20/2021                MCV                      93.1                06/20/2021                PLT  322                 06/20/2021              Anesthesia Other Findings   Reproductive/Obstetrics                            Anesthesia Physical Anesthesia Plan  ASA: 2  Anesthesia Plan: Spinal   Post-op Pain Management: Regional block*   Induction:   PONV Risk Score and Plan: 2 and Treatment may vary due to age or medical condition, Midazolam and Ondansetron  Airway Management Planned: Nasal Cannula and Natural Airway  Additional Equipment:  None  Intra-op Plan:   Post-operative Plan:   Informed Consent: I have reviewed the patients History and Physical, chart, labs and discussed the procedure including the risks, benefits and alternatives for the proposed anesthesia with the patient or authorized representative who has indicated his/her understanding and acceptance.     Dental advisory given  Plan Discussed with:   Anesthesia Plan Comments:        Anesthesia Quick Evaluation

## 2021-06-29 ENCOUNTER — Ambulatory Visit (HOSPITAL_COMMUNITY): Payer: Medicare PPO

## 2021-06-29 ENCOUNTER — Ambulatory Visit (HOSPITAL_BASED_OUTPATIENT_CLINIC_OR_DEPARTMENT_OTHER): Payer: Medicare PPO | Admitting: Anesthesiology

## 2021-06-29 ENCOUNTER — Encounter (HOSPITAL_COMMUNITY): Payer: Self-pay | Admitting: Orthopaedic Surgery

## 2021-06-29 ENCOUNTER — Ambulatory Visit (HOSPITAL_COMMUNITY): Payer: Medicare PPO | Admitting: Anesthesiology

## 2021-06-29 ENCOUNTER — Other Ambulatory Visit: Payer: Self-pay

## 2021-06-29 ENCOUNTER — Observation Stay (HOSPITAL_COMMUNITY): Payer: Medicare PPO

## 2021-06-29 ENCOUNTER — Encounter (HOSPITAL_COMMUNITY)
Admission: RE | Disposition: A | Discharge status: Home Health | Payer: Self-pay | Source: Home / Self Care | Attending: Orthopaedic Surgery

## 2021-06-29 ENCOUNTER — Observation Stay (HOSPITAL_COMMUNITY)
Admission: RE | Admit: 2021-06-29 | Discharge: 2021-06-30 | Disposition: A | Discharge status: Home Health | Payer: Medicare PPO | Attending: Orthopaedic Surgery | Admitting: Orthopaedic Surgery

## 2021-06-29 DIAGNOSIS — M1612 Unilateral primary osteoarthritis, left hip: Secondary | ICD-10-CM | POA: Diagnosis present

## 2021-06-29 DIAGNOSIS — I10 Essential (primary) hypertension: Secondary | ICD-10-CM | POA: Diagnosis not present

## 2021-06-29 DIAGNOSIS — M1611 Unilateral primary osteoarthritis, right hip: Principal | ICD-10-CM | POA: Insufficient documentation

## 2021-06-29 DIAGNOSIS — Z96642 Presence of left artificial hip joint: Secondary | ICD-10-CM | POA: Diagnosis not present

## 2021-06-29 DIAGNOSIS — Z471 Aftercare following joint replacement surgery: Secondary | ICD-10-CM | POA: Diagnosis not present

## 2021-06-29 DIAGNOSIS — Z8616 Personal history of COVID-19: Secondary | ICD-10-CM | POA: Diagnosis not present

## 2021-06-29 DIAGNOSIS — Z01818 Encounter for other preprocedural examination: Secondary | ICD-10-CM

## 2021-06-29 DIAGNOSIS — Z79899 Other long term (current) drug therapy: Secondary | ICD-10-CM | POA: Insufficient documentation

## 2021-06-29 HISTORY — PX: TOTAL HIP ARTHROPLASTY: SHX124

## 2021-06-29 LAB — TYPE AND SCREEN
ABO/RH(D): O POS
Antibody Screen: NEGATIVE

## 2021-06-29 SURGERY — ARTHROPLASTY, HIP, TOTAL, ANTERIOR APPROACH
Anesthesia: Spinal | Site: Hip | Laterality: Left

## 2021-06-29 MED ORDER — NEBIVOLOL HCL 10 MG PO TABS
20.0000 mg | ORAL_TABLET | Freq: Every day | ORAL | Status: DC
  Filled 2021-06-29: qty 2

## 2021-06-29 MED ORDER — TRANEXAMIC ACID-NACL 1000-0.7 MG/100ML-% IV SOLN
1000.0000 mg | INTRAVENOUS | Status: AC
  Administered 2021-06-29: 1000 mg via INTRAVENOUS
  Filled 2021-06-29: qty 100

## 2021-06-29 MED ORDER — AMLODIPINE BESYLATE 10 MG PO TABS
10.0000 mg | ORAL_TABLET | Freq: Every day | ORAL | Status: DC
  Administered 2021-06-30: 10 mg via ORAL
  Filled 2021-06-29: qty 1

## 2021-06-29 MED ORDER — OXYCODONE HCL 5 MG/5ML PO SOLN: 5.0000 mg | Freq: Once | ORAL | Status: DC | PRN

## 2021-06-29 MED ORDER — ONDANSETRON HCL 4 MG/2ML IJ SOLN
INTRAMUSCULAR | Status: DC | PRN
  Administered 2021-06-29: 4 mg via INTRAVENOUS

## 2021-06-29 MED ORDER — ONDANSETRON HCL 4 MG/2ML IJ SOLN
INTRAMUSCULAR | Status: AC
  Filled 2021-06-29: qty 2

## 2021-06-29 MED ORDER — OXYCODONE HCL 5 MG PO TABS
ORAL_TABLET | ORAL | Status: AC
  Filled 2021-06-29: qty 2

## 2021-06-29 MED ORDER — PHENOL 1.4 % MT LIQD: 1.0000 | OROMUCOSAL | Status: DC | PRN

## 2021-06-29 MED ORDER — ASPIRIN 81 MG PO CHEW
81.0000 mg | CHEWABLE_TABLET | Freq: Two times a day (BID) | ORAL | Status: DC
  Administered 2021-06-29 – 2021-06-30 (×2): 81 mg via ORAL
  Filled 2021-06-29 (×2): qty 1

## 2021-06-29 MED ORDER — PHENYLEPHRINE HCL (PRESSORS) 10 MG/ML IV SOLN
INTRAVENOUS | Status: AC
  Filled 2021-06-29: qty 1

## 2021-06-29 MED ORDER — 0.9 % SODIUM CHLORIDE (POUR BTL) OPTIME
TOPICAL | Status: DC | PRN
  Administered 2021-06-29: 450 mL

## 2021-06-29 MED ORDER — DEXAMETHASONE SODIUM PHOSPHATE 10 MG/ML IJ SOLN
INTRAMUSCULAR | Status: DC | PRN
  Administered 2021-06-29: 8 mg via INTRAVENOUS

## 2021-06-29 MED ORDER — HYDROMORPHONE HCL 1 MG/ML IJ SOLN
0.5000 mg | INTRAMUSCULAR | Status: DC | PRN
  Administered 2021-06-29: 1 mg via INTRAVENOUS
  Filled 2021-06-29: qty 1

## 2021-06-29 MED ORDER — MIDAZOLAM HCL 5 MG/5ML IJ SOLN
INTRAMUSCULAR | Status: DC | PRN
  Administered 2021-06-29: 1 mg via INTRAVENOUS

## 2021-06-29 MED ORDER — METOCLOPRAMIDE HCL 5 MG/ML IJ SOLN: 5.0000 mg | Freq: Three times a day (TID) | INTRAMUSCULAR | Status: DC | PRN

## 2021-06-29 MED ORDER — METHOCARBAMOL 500 MG PO TABS
ORAL_TABLET | ORAL | Status: AC
  Filled 2021-06-29: qty 1

## 2021-06-29 MED ORDER — FENTANYL CITRATE (PF) 100 MCG/2ML IJ SOLN
INTRAMUSCULAR | Status: AC
  Filled 2021-06-29: qty 2

## 2021-06-29 MED ORDER — MENTHOL 3 MG MT LOZG: 1.0000 | LOZENGE | OROMUCOSAL | Status: DC | PRN

## 2021-06-29 MED ORDER — MIDAZOLAM HCL 2 MG/2ML IJ SOLN
INTRAMUSCULAR | Status: AC
  Filled 2021-06-29: qty 2

## 2021-06-29 MED ORDER — POVIDONE-IODINE 10 % EX SWAB
2.0000 | Freq: Once | CUTANEOUS | Status: AC
  Administered 2021-06-29: 2 via TOPICAL

## 2021-06-29 MED ORDER — CEFAZOLIN SODIUM-DEXTROSE 2-4 GM/100ML-% IV SOLN
2.0000 g | INTRAVENOUS | Status: AC
  Administered 2021-06-29: 2 g via INTRAVENOUS
  Filled 2021-06-29: qty 100

## 2021-06-29 MED ORDER — OXYCODONE HCL 5 MG PO TABS: 5.0000 mg | ORAL_TABLET | Freq: Once | ORAL | Status: DC | PRN

## 2021-06-29 MED ORDER — ALUM & MAG HYDROXIDE-SIMETH 200-200-20 MG/5ML PO SUSP: 30.0000 mL | ORAL | Status: DC | PRN

## 2021-06-29 MED ORDER — ZOLPIDEM TARTRATE 5 MG PO TABS
5.0000 mg | ORAL_TABLET | Freq: Every evening | ORAL | Status: DC | PRN
  Administered 2021-06-29: 5 mg via ORAL
  Filled 2021-06-29: qty 1

## 2021-06-29 MED ORDER — HYDROMORPHONE HCL 1 MG/ML IJ SOLN: 0.2500 mg | INTRAMUSCULAR | Status: DC | PRN

## 2021-06-29 MED ORDER — ONDANSETRON HCL 4 MG/2ML IJ SOLN: 4.0000 mg | Freq: Four times a day (QID) | INTRAMUSCULAR | Status: DC | PRN

## 2021-06-29 MED ORDER — ORAL CARE MOUTH RINSE: 15.0000 mL | Freq: Once | OROMUCOSAL | Status: AC

## 2021-06-29 MED ORDER — ADULT MULTIVITAMIN W/MINERALS CH
1.0000 | ORAL_TABLET | Freq: Every day | ORAL | Status: DC
  Administered 2021-06-29 – 2021-06-30 (×2): 1 via ORAL
  Filled 2021-06-29 (×2): qty 1

## 2021-06-29 MED ORDER — LACTATED RINGERS IV SOLN: INTRAVENOUS | Status: DC

## 2021-06-29 MED ORDER — SODIUM CHLORIDE 0.9 % IV SOLN: INTRAVENOUS | Status: DC

## 2021-06-29 MED ORDER — ACETAMINOPHEN 10 MG/ML IV SOLN: 1000.0000 mg | Freq: Once | INTRAVENOUS | Status: DC | PRN

## 2021-06-29 MED ORDER — ACETAMINOPHEN 325 MG PO TABS: 325.0000 mg | ORAL_TABLET | Freq: Four times a day (QID) | ORAL | Status: DC | PRN

## 2021-06-29 MED ORDER — PROPOFOL 500 MG/50ML IV EMUL
INTRAVENOUS | Status: DC | PRN
  Administered 2021-06-29: 50 ug/kg/min via INTRAVENOUS

## 2021-06-29 MED ORDER — FENTANYL CITRATE (PF) 100 MCG/2ML IJ SOLN
INTRAMUSCULAR | Status: DC | PRN
  Administered 2021-06-29: 50 ug via INTRAVENOUS
  Administered 2021-06-29 (×2): 25 ug via INTRAVENOUS

## 2021-06-29 MED ORDER — PROPOFOL 1000 MG/100ML IV EMUL
INTRAVENOUS | Status: AC
  Filled 2021-06-29: qty 100

## 2021-06-29 MED ORDER — OXYCODONE HCL 5 MG PO TABS
5.0000 mg | ORAL_TABLET | ORAL | Status: DC | PRN
  Administered 2021-06-29: 10 mg via ORAL

## 2021-06-29 MED ORDER — PANTOPRAZOLE SODIUM 40 MG PO TBEC
40.0000 mg | DELAYED_RELEASE_TABLET | Freq: Every day | ORAL | Status: DC
  Administered 2021-06-29 – 2021-06-30 (×2): 40 mg via ORAL
  Filled 2021-06-29 (×2): qty 1

## 2021-06-29 MED ORDER — CEFAZOLIN SODIUM-DEXTROSE 1-4 GM/50ML-% IV SOLN
1.0000 g | Freq: Four times a day (QID) | INTRAVENOUS | Status: AC
  Administered 2021-06-29 (×2): 1 g via INTRAVENOUS
  Filled 2021-06-29 (×2): qty 50

## 2021-06-29 MED ORDER — POLYETHYLENE GLYCOL 3350 17 G PO PACK: 17.0000 g | PACK | Freq: Every day | ORAL | Status: DC | PRN

## 2021-06-29 MED ORDER — DEXMEDETOMIDINE (PRECEDEX) IN NS 20 MCG/5ML (4 MCG/ML) IV SYRINGE
PREFILLED_SYRINGE | INTRAVENOUS | Status: DC | PRN
  Administered 2021-06-29: 8 ug via INTRAVENOUS
  Administered 2021-06-29: 4 ug via INTRAVENOUS

## 2021-06-29 MED ORDER — AMISULPRIDE (ANTIEMETIC) 5 MG/2ML IV SOLN: 10.0000 mg | Freq: Once | INTRAVENOUS | Status: DC | PRN

## 2021-06-29 MED ORDER — ONDANSETRON HCL 4 MG/2ML IJ SOLN: 4.0000 mg | Freq: Once | INTRAMUSCULAR | Status: DC | PRN

## 2021-06-29 MED ORDER — CHLORHEXIDINE GLUCONATE 0.12 % MT SOLN
15.0000 mL | Freq: Once | OROMUCOSAL | Status: AC
  Administered 2021-06-29: 15 mL via OROMUCOSAL

## 2021-06-29 MED ORDER — LIDOCAINE HCL (CARDIAC) PF 100 MG/5ML IV SOSY
PREFILLED_SYRINGE | INTRAVENOUS | Status: DC | PRN
  Administered 2021-06-29: 40 mg via INTRAVENOUS

## 2021-06-29 MED ORDER — DIPHENHYDRAMINE HCL 12.5 MG/5ML PO ELIX: 12.5000 mg | ORAL_SOLUTION | ORAL | Status: DC | PRN

## 2021-06-29 MED ORDER — ONDANSETRON HCL 4 MG/2ML IJ SOLN
INTRAMUSCULAR | Status: DC | PRN
  Administered 2021-06-29: 4 mg via INTRAMUSCULAR

## 2021-06-29 MED ORDER — METHOCARBAMOL 500 MG PO TABS
500.0000 mg | ORAL_TABLET | Freq: Four times a day (QID) | ORAL | Status: DC | PRN
  Administered 2021-06-29 (×3): 500 mg via ORAL
  Filled 2021-06-29 (×3): qty 1

## 2021-06-29 MED ORDER — PHENYLEPHRINE HCL-NACL 20-0.9 MG/250ML-% IV SOLN
INTRAVENOUS | Status: DC | PRN
  Administered 2021-06-29: 30 ug/min via INTRAVENOUS

## 2021-06-29 MED ORDER — BUPIVACAINE IN DEXTROSE 0.75-8.25 % IT SOLN
INTRATHECAL | Status: DC | PRN
  Administered 2021-06-29: 12 mg via INTRATHECAL

## 2021-06-29 MED ORDER — METOCLOPRAMIDE HCL 5 MG PO TABS: 5.0000 mg | ORAL_TABLET | Freq: Three times a day (TID) | ORAL | Status: DC | PRN

## 2021-06-29 MED ORDER — SODIUM CHLORIDE 0.9 % IR SOLN
Status: DC | PRN
  Administered 2021-06-29: 900 mL

## 2021-06-29 MED ORDER — DOCUSATE SODIUM 100 MG PO CAPS
100.0000 mg | ORAL_CAPSULE | Freq: Two times a day (BID) | ORAL | Status: DC
  Administered 2021-06-29 – 2021-06-30 (×2): 100 mg via ORAL
  Filled 2021-06-29 (×2): qty 1

## 2021-06-29 MED ORDER — DEXMEDETOMIDINE (PRECEDEX) IN NS 20 MCG/5ML (4 MCG/ML) IV SYRINGE
PREFILLED_SYRINGE | INTRAVENOUS | Status: AC
  Filled 2021-06-29: qty 5

## 2021-06-29 MED ORDER — ONDANSETRON HCL 4 MG PO TABS: 4.0000 mg | ORAL_TABLET | Freq: Four times a day (QID) | ORAL | Status: DC | PRN

## 2021-06-29 MED ORDER — DEXAMETHASONE SODIUM PHOSPHATE 10 MG/ML IJ SOLN
INTRAMUSCULAR | Status: AC
  Filled 2021-06-29: qty 1

## 2021-06-29 MED ORDER — OXYCODONE HCL 5 MG PO TABS
10.0000 mg | ORAL_TABLET | ORAL | Status: DC | PRN
  Administered 2021-06-29 – 2021-06-30 (×2): 10 mg via ORAL
  Filled 2021-06-29 (×3): qty 2

## 2021-06-29 MED ORDER — METHOCARBAMOL 500 MG IVPB - SIMPLE MED: 500.0000 mg | Freq: Four times a day (QID) | INTRAVENOUS | Status: DC | PRN

## 2021-06-29 MED ORDER — LIDOCAINE HCL (PF) 2 % IJ SOLN
INTRAMUSCULAR | Status: AC
  Filled 2021-06-29: qty 5

## 2021-06-29 SURGICAL SUPPLY — 42 items
BAG COUNTER SPONGE SURGICOUNT (BAG) ×2 IMPLANT
BAG ZIPLOCK 12X15 (MISCELLANEOUS) ×1 IMPLANT
BENZOIN TINCTURE PRP APPL 2/3 (GAUZE/BANDAGES/DRESSINGS) IMPLANT
BLADE SAW SGTL 18X1.27X75 (BLADE) ×2 IMPLANT
COVER PERINEAL POST (MISCELLANEOUS) ×2 IMPLANT
COVER SURGICAL LIGHT HANDLE (MISCELLANEOUS) ×2 IMPLANT
CUP SECTOR GRIPTON 50MM (Cup) ×1 IMPLANT
DRAPE FOOT SWITCH (DRAPES) ×2 IMPLANT
DRAPE STERI IOBAN 125X83 (DRAPES) ×2 IMPLANT
DRAPE U-SHAPE 47X51 STRL (DRAPES) ×4 IMPLANT
DRSG AQUACEL AG ADV 3.5X10 (GAUZE/BANDAGES/DRESSINGS) ×2 IMPLANT
DURAPREP 26ML APPLICATOR (WOUND CARE) ×2 IMPLANT
ELECT REM PT RETURN 15FT ADLT (MISCELLANEOUS) ×2 IMPLANT
FEM STEM 12/14 TAPER SZ 4 HIP (Orthopedic Implant) ×2 IMPLANT
FEMORAL STEM 12/14 TPR SZ4 HIP (Orthopedic Implant) IMPLANT
GAUZE XEROFORM 1X8 LF (GAUZE/BANDAGES/DRESSINGS) ×1 IMPLANT
GLOVE BIO SURGEON STRL SZ7.5 (GLOVE) ×2 IMPLANT
GLOVE BIOGEL PI IND STRL 8 (GLOVE) ×2 IMPLANT
GLOVE BIOGEL PI INDICATOR 8 (GLOVE) ×2
GLOVE ECLIPSE 8.0 STRL XLNG CF (GLOVE) ×2 IMPLANT
GOWN STRL REUS W/ TWL XL LVL3 (GOWN DISPOSABLE) ×2 IMPLANT
GOWN STRL REUS W/TWL XL LVL3 (GOWN DISPOSABLE) ×4
HANDPIECE INTERPULSE COAX TIP (DISPOSABLE) ×1
HEAD FEMORAL 32 CERAMIC (Hips) ×1 IMPLANT
HOLDER FOLEY CATH W/STRAP (MISCELLANEOUS) ×2 IMPLANT
KIT TURNOVER KIT A (KITS) ×1 IMPLANT
LINER ACETABULAR 32X50 (Liner) ×1 IMPLANT
PACK ANTERIOR HIP CUSTOM (KITS) ×2 IMPLANT
SCREW 6.5MMX25MM (Screw) ×1 IMPLANT
SET HNDPC FAN SPRY TIP SCT (DISPOSABLE) ×1 IMPLANT
STAPLER VISISTAT 35W (STAPLE) ×1 IMPLANT
STRIP CLOSURE SKIN 1/2X4 (GAUZE/BANDAGES/DRESSINGS) IMPLANT
SUT ETHIBOND NAB CT1 #1 30IN (SUTURE) ×2 IMPLANT
SUT ETHILON 2 0 PS N (SUTURE) IMPLANT
SUT MNCRL AB 4-0 PS2 18 (SUTURE) IMPLANT
SUT VIC AB 0 CT1 36 (SUTURE) ×2 IMPLANT
SUT VIC AB 1 CT1 36 (SUTURE) ×2 IMPLANT
SUT VIC AB 2-0 CT1 27 (SUTURE) ×2
SUT VIC AB 2-0 CT1 TAPERPNT 27 (SUTURE) ×2 IMPLANT
TRAY FOLEY MTR SLVR 14FR STAT (SET/KITS/TRAYS/PACK) ×1 IMPLANT
TRAY FOLEY MTR SLVR 16FR STAT (SET/KITS/TRAYS/PACK) IMPLANT
YANKAUER SUCT BULB TIP NO VENT (SUCTIONS) ×2 IMPLANT

## 2021-06-29 NOTE — Plan of Care (Signed)

## 2021-06-29 NOTE — Transfer of Care (Signed)
Immediate Anesthesia Transfer of Care Note  Patient: Anne Aguirre  Procedure(s) Performed: LEFT TOTAL HIP ARTHROPLASTY ANTERIOR APPROACH (Left: Hip)  Patient Location: PACU  Anesthesia Type:Spinal  Level of Consciousness: awake, alert  and oriented  Airway & Oxygen Therapy: Patient Spontanous Breathing and Patient connected to face mask oxygen  Post-op Assessment: Report given to RN and Post -op Vital signs reviewed and stable  Post vital signs: Reviewed and stable  Last Vitals:  Vitals Value Taken Time  BP 93/57 06/29/21 0846  Temp    Pulse 62 06/29/21 0846  Resp 14 06/29/21 0846  SpO2 98 % 06/29/21 0846  Vitals shown include unvalidated device data.  Last Pain:  Vitals:   06/29/21 0555  TempSrc:   PainSc: 0-No pain      Patients Stated Pain Goal: 4 (06/29/21 0555)  Complications: No notable events documented.

## 2021-06-29 NOTE — Op Note (Signed)
[name], Aguirre MEDICAL RECORD NO: [id number] ACCOUNT NO: [id number] DATE OF BIRTH: August 29, 1950 FACILITY: [facility] LOCATION: [facility] PHYSICIAN: Vanita Panda. Anne Ivan, MD  Operative Report   DATE OF PROCEDURE: 06/29/2021  PREOPERATIVE DIAGNOSIS:  Primary osteoarthritis and degenerative joint disease, left hip.  POSTOPERATIVE DIAGNOSIS:  Primary osteoarthritis and degenerative joint disease, left hip.  PROCEDURE:  Left total hip arthroplasty through direct anterior approach.  IMPLANTS:  DePuy sector Gription acetabular component, size 50, a single acetabular dome screw, size 32+0 neutral polyethylene liner, size 4 ACTIS femoral component with high offset, size 32+1 ceramic hip ball.  SURGEON:  Vanita Panda. Anne Aguirre.  ASSISTANT:  Rexene Edison, PA-C.  ANESTHESIA:  Spinal.  ANTIBIOTICS:  2 g IV Ancef.  ESTIMATED BLOOD LOSS:  150 mL  COMPLICATIONS:  None.  INDICATIONS:  The patient is a 71 year old female well known to me.  She has debilitating arthritis involving her left hip.  This has been well documented with x-rays and clinical exam findings.  She has failed conservative treatment for over a year now.   Her left hip pain is daily and it is detrimentally affecting her mobility, her quality of life and activities of daily living to the point she does wish to proceed with a total hip arthroplasty.  We talked about the risk of acute blood loss anemia,  nerve or vessel injury, fracture, infection, dislocation, DVT, implant failure, leg length differences and skin and soft tissue issues.  We talked about our goals being decreased pain, improve mobility and overall improve quality of life.  DESCRIPTION OF PROCEDURE:  After informed consent was obtained, appropriate left hip was marked.  She was brought to the operating room and sat up on the stretcher where spinal anesthesia was obtained.  She was laid in supine position on the stretcher.   Foley catheter was placed.  I assessed her leg  lengths and placed traction boots on both her feet.  Next, she was placed supine on the Hana fracture table, the perineal post in place and both legs in line skeletal traction device and no traction applied.   Her left operative hip was prepped and draped with DuraPrep and sterile drapes.  A timeout was called.  She was identified correct patient, correct left hip.  We then made an incision just inferior and posterior to the anterior superior iliac spine and  carried this obliquely down the leg.  We dissected down tensor fascia lata muscle.  Tensor fascia was then divided longitudinally to proceed with direct anterior approach to the hip.  We identified and cauterized circumflex vessels and identified the  hip capsule, opened up the hip capsule in L-type format found a moderate joint effusion and significant osteoarthritis around the lateral femoral head and neck.  We placed Cobra retractors within the joint capsule around the medial and lateral femoral  neck and made a femoral neck cut with oscillating saw just proximal to the lesser trochanter and completed this with an osteotome and placed a corkscrew guide in the femoral head and removed the femoral head in its entirety and found a wide area devoid  of cartilage.  I then placed a bent Hohmann over the medial acetabular rim and removed remnants of the acetabular labrum and other debris, we then began reaming under direct visualization from a size 43 reamer in a stepwise increments going up to a size  49 reamer with all reamers placed under direct visualization.  Last reamers placed under direct fluoroscopy, so we could obtain our depth  of reaming, our inclination and anteversion.  I then placed real DePuy sector Gription acetabular component size 50  and a single dome screw given the sclerotic bone.  I then placed a 32+0 neutral polyethylene liner.  Attention was then turned to the femur.  With the leg externally rotated to 120 degrees and extended and  adducted, we were able to place a Mueller  retractor medially and Hohman retractor behind the greater trochanter.  We released lateral joint capsule and used a box cutting osteotome to enter femoral canal and a rongeur to lateralize, we then began broaching using the ACTIS broaching system from a  size 0 going to a size 4.  With a size 4 in place, we trialed a high offset femoral neck based of her anatomy and a 32+1 trial hip ball.  We brought the leg back over and up and with traction and internal rotation reduced in the acetabulum.  We were  pleased with range of motion and stability assessed mechanically and radiographically, assessment of leg length and offset.  We then dislocated the hip, removed the trial components.  We placed the real ACTIS femoral component, size 4 with high offset  and the real 32+1 ceramic hip ball and again reduced this in acetabulum and it was stable.  We assessed again mechanically and radiographically.  I then irrigated the soft tissue with normal saline solution.  The joint capsule was closed with interrupted  #1 Ethibond suture followed by #1 Vicryl to close the tensor fascia.  0 Vicryl was used to close deep tissue and 2-0 Vicryl was used to close subcutaneous tissue and the skin was closed with staples.  An Aquacel dressing was applied.  She was taken off  the Hana table and taken to recovery room in stable condition with all final counts being correct.  There were no complications noted.  Of note, Rexene Edison, PA-C, assisted during the entire case from beginning to end.  His assistance was medically  necessary and useful for helping with soft tissue retraction as well as guiding implant placement.  He was involved in a layered closure of the wound.     [email or web address] D: 06/29/2021 8:26:40 am T: 06/29/2021 9:28:00 am  JOB: [id number]/ [id number]

## 2021-06-29 NOTE — Plan of Care (Signed)
  Problem: Clinical Measurements: Goal: Respiratory complications will improve Outcome: Not Applicable Goal: Cardiovascular complication will be avoided Outcome: Not Applicable   Problem: Activity: Goal: Risk for activity intolerance will decrease Outcome: Progressing   Problem: Nutrition: Goal: Adequate nutrition will be maintained Outcome: Progressing   Problem: Pain Managment: Goal: General experience of comfort will improve Outcome: Progressing   

## 2021-06-29 NOTE — Brief Op Note (Signed)
06/29/2021  8:32 AM  PATIENT:  Anne Aguirre  71 y.o. female  PRE-OPERATIVE DIAGNOSIS:  OSTEOARTHRITIS / DEGENERATIVE JOINT DISEASE LEFT HIP  POST-OPERATIVE DIAGNOSIS:  OSTEOARTHRITIS / DEGENERATIVE JOINT DISEASE LEFT HIP  PROCEDURE:  Procedure(s): LEFT TOTAL HIP ARTHROPLASTY ANTERIOR APPROACH (Left)  SURGEON:  Surgeon(s) and Role:    Kathryne Hitch, MD - Primary  PHYSICIAN ASSISTANT:  Rexene Edison, PA-C  ANESTHESIA:   spinal  EBL:  150 mL   COUNTS:  YES  DICTATION: .Other Dictation: Dictation Number [id number]  PLAN OF CARE: Admit for overnight observation  PATIENT DISPOSITION:  PACU - hemodynamically stable.   Delay start of Pharmacological VTE agent (>24hrs) due to surgical blood loss or risk of bleeding: no

## 2021-06-29 NOTE — TOC Transition Note (Signed)
Transition of Care [facility]) - CM/SW Discharge Note   Patient Details  Name: Anne Aguirre MRN: [id number] Date of Birth: 10/05/1950  Transition of Care [facility]) CM/SW Contact:  Lennart Pall, LCSW Phone Number: 06/29/2021, 2:15 PM   Clinical Narrative:    Met with pt and confirming she has all needed DME at home.  Pt aware she has been set up for HHPT with [facility].  No further TOC needs.   Final next level of care: [place] Barriers to Discharge: No Barriers Identified   Patient Goals and CMS Choice Patient states their goals for this hospitalization and ongoing recovery are:: return home      Discharge Placement                       Discharge Plan and Services                DME Arranged: N/A DME Agency: NA       HH Arranged: PT [place] Agency: [place]        Social Determinants of Health (SDOH) Interventions     Readmission Risk Interventions     No data to display

## 2021-06-29 NOTE — Interval H&P Note (Signed)
History and Physical Interval Note: The patient understands that she is here today for a left hip replacement to treat her left hip osteoarthritis.  There has been no acute or interval change in medical status.  See H&P.  The risks and benefits of surgery been discussed in detail and informed consent is obtained.  The left operative hip has been marked.  06/29/2021 7:10 AM  Mauricio Po  has presented today for surgery, with the diagnosis of OSTEOARTHRITIS / DEGENERATIVE JOINT DISEASE LEFT HIP.  The various methods of treatment have been discussed with the patient and family. After consideration of risks, benefits and other options for treatment, the patient has consented to  Procedure(s): LEFT TOTAL HIP ARTHROPLASTY ANTERIOR APPROACH (Left) as a surgical intervention.  The patient's history has been reviewed, patient examined, no change in status, stable for surgery.  I have reviewed the patient's chart and labs.  Questions were answered to the patient's satisfaction.     Kathryne Hitch

## 2021-06-29 NOTE — Anesthesia Procedure Notes (Signed)
Spinal  Patient location during procedure: OR Start time: 06/29/2021 7:19 AM End time: 06/29/2021 7:24 AM Reason for block: surgical anesthesia Staffing Performed: anesthesiologist  Anesthesiologist: [name], MD Performed by: [name], MD Authorized by: [name], MD   Preanesthetic Checklist Completed: patient identified, IV checked, risks and benefits discussed, surgical consent, monitors and equipment checked, pre-op evaluation and timeout performed Spinal Block Patient position: sitting Prep: DuraPrep and site prepped and draped Patient monitoring: heart rate, cardiac monitor, continuous pulse ox and blood pressure Approach: midline Location: L3-4 Injection technique: single-shot Needle Needle type: Pencan  Needle gauge: 24 G Needle length: 10 cm Needle insertion depth: 6 cm Assessment Sensory level: T4 Events: CSF return Additional Notes 1 Attempt (s). Pt tolerated procedure well.

## 2021-06-29 NOTE — Evaluation (Signed)
Physical Therapy Evaluation Patient Details Name: Anne Aguirre MRN: [id number] DOB: Dec 19, 1950 Today's Date: 06/29/2021  History of Present Illness  Pt s/p L THR and with hx of hartmanns colostomy and reversal  Clinical Impression  Pt s/p L THR and presents with decreased L LE strength/ROM and post op pain limiting functional mobility.  Pt should progress to dc home with family assist.     Recommendations for follow up therapy are one component of a multi-disciplinary discharge planning process, led by the attending physician.  Recommendations may be updated based on patient status, additional functional criteria and insurance authorization.  Follow Up Recommendations Home health PT    Assistance Recommended at Discharge    Patient can return home with the following  A little help with walking and/or transfers;A little help with bathing/dressing/bathroom;Assistance with cooking/housework;Assist for transportation;Help with stairs or ramp for entrance    Equipment Recommendations None recommended by PT  Recommendations for Other Services       Functional Status Assessment Patient has had a recent decline in their functional status and demonstrates the ability to make significant improvements in function in a reasonable and predictable amount of time.     Precautions / Restrictions Precautions Precautions: Fall Restrictions Weight Bearing Restrictions: No Other Position/Activity Restrictions: WBAT      Mobility  Bed Mobility Overal bed mobility: Needs Assistance Bed Mobility: Supine to Sit     Supine to sit: Min assist     General bed mobility comments: cues for sequence and use of R LE to self assist    Transfers Overall transfer level: Needs assistance Equipment used: Rolling walker (2 wheels) Transfers: Sit to/from Stand Sit to Stand: Min assist           General transfer comment: cues for LE management and use of UEs to self assist     Ambulation/Gait Ambulation/Gait assistance: Min assist Gait Distance (Feet): 54 Feet Assistive device: Rolling walker (2 wheels) Gait Pattern/deviations: Step-to pattern, Shuffle, Trunk flexed Gait velocity: decr     General Gait Details: cues for sequence, posture and position from [employer]            Wheelchair Mobility    Modified Rankin (Stroke Patients Only)       Balance Overall balance assessment: Needs assistance Sitting-balance support: No upper extremity supported Sitting balance-Leahy Scale: Good     Standing balance support: No upper extremity supported Standing balance-Leahy Scale: Fair                               Pertinent Vitals/Pain Pain Assessment Pain Assessment: 0-10 Pain Score: 5  Pain Location: L hip Pain Descriptors / Indicators: Aching, Sore Pain Intervention(s): Limited activity within patient's tolerance, Monitored during session, Premedicated before session    Home Living Family/patient expects to be discharged to:: Private residence Living Arrangements: Spouse/significant other Available Help at Discharge: Family;Available 24 hours/day Type of Home: House Home Access: Ramped entrance       Home Layout: One level Home Equipment: Grab bars - toilet;Grab bars - tub/shower;Rollator (4 wheels);Rolling Walker (2 wheels);Cane - single point;BSC/3in1      Prior Function Prior Level of Function : Independent/Modified Independent             Mobility Comments: uses cane for ambulation       Hand Dominance   Dominant Hand: Right    Extremity/Trunk Assessment   Upper Extremity Assessment Upper Extremity Assessment:  Overall [facility] for tasks assessed    Lower Extremity Assessment Lower Extremity Assessment: LLE deficits/detail    Cervical / Trunk Assessment Cervical / Trunk Assessment: Normal  Communication   Communication: No difficulties  Cognition Arousal/Alertness: Awake/alert Behavior During  Therapy: WFL for tasks assessed/performed Overall Cognitive Status: Within Functional Limits for tasks assessed                                          General Comments      Exercises Total Joint Exercises Ankle Circles/Pumps: AROM, Both, 15 reps, Supine   Assessment/Plan    PT Assessment Patient needs continued PT services  PT Problem List Decreased strength;Decreased range of motion;Decreased activity tolerance;Decreased balance;Decreased mobility;Decreased knowledge of use of DME;Pain       PT Treatment Interventions DME instruction;Gait training;Stair training;Functional mobility training;Therapeutic activities;Therapeutic exercise;Patient/family education    PT Goals (Current goals can be found in the Care Plan section)  Acute Rehab PT Goals Patient Stated Goal: Regain IND PT Goal Formulation: With patient Time For Goal Achievement: 07/06/21 Potential to Achieve Goals: Good    Frequency 7X/week     Co-evaluation               AM-PAC PT "6 Clicks" Mobility  Outcome Measure Help needed turning from your back to your side while in a flat bed without using bedrails?: A Little Help needed moving from lying on your back to sitting on the side of a flat bed without using bedrails?: A Little Help needed moving to and from a bed to a chair (including a wheelchair)?: A Little Help needed standing up from a chair using your arms (e.g., wheelchair or bedside chair)?: A Little Help needed to walk in hospital room?: A Little Help needed climbing 3-5 steps with a railing? : A Lot 6 Click Score: 17    End of Session Equipment Utilized During Treatment: Gait belt Activity Tolerance: Patient tolerated treatment well Patient left: in chair;with call bell/phone within reach;with family/visitor present Nurse Communication: Mobility status PT Visit Diagnosis: Difficulty in walking, not elsewhere classified (R26.2)    Time: 5809-9833 PT Time Calculation  (min) (ACUTE ONLY): 20 min   Charges:   PT Evaluation $PT Eval Low Complexity: 1 Low          Mauro Kaufmann PT Acute Rehabilitation Services Pager [phone] Office [phone]   [name] 06/29/2021, 1:52 PM

## 2021-06-29 NOTE — Anesthesia Postprocedure Evaluation (Signed)
Anesthesia Post Note  Patient: Anne Aguirre  Procedure(s) Performed: LEFT TOTAL HIP ARTHROPLASTY ANTERIOR APPROACH (Left: Hip)     Patient location during evaluation: Nursing Unit Anesthesia Type: Spinal Level of consciousness: oriented and awake and alert Pain management: pain level controlled Vital Signs Assessment: post-procedure vital signs reviewed and stable Respiratory status: spontaneous breathing and respiratory function stable Cardiovascular status: blood pressure returned to baseline and stable Postop Assessment: no headache, no backache, no apparent nausea or vomiting and patient able to bend at knees Anesthetic complications: no   No notable events documented.  Last Vitals:  Vitals:   06/29/21 1024 06/29/21 1235  BP: 133/78 123/68  Pulse: 69 67  Resp:  18  Temp:  36.6 C  SpO2: 100% 97%    Last Pain:  Vitals:   06/29/21 1235  TempSrc: Oral  PainSc:                  [name]

## 2021-06-30 DIAGNOSIS — I10 Essential (primary) hypertension: Secondary | ICD-10-CM | POA: Diagnosis not present

## 2021-06-30 DIAGNOSIS — Z79899 Other long term (current) drug therapy: Secondary | ICD-10-CM | POA: Diagnosis not present

## 2021-06-30 DIAGNOSIS — Z8616 Personal history of COVID-19: Secondary | ICD-10-CM | POA: Diagnosis not present

## 2021-06-30 DIAGNOSIS — M1611 Unilateral primary osteoarthritis, right hip: Secondary | ICD-10-CM | POA: Diagnosis not present

## 2021-06-30 LAB — CBC
HCT: 37.5 % (ref 36.0–46.0)
Hemoglobin: 13 g/dL (ref 12.0–15.0)
MCH: 32 pg (ref 26.0–34.0)
MCHC: 34.7 g/dL (ref 30.0–36.0)
MCV: 92.4 fL (ref 80.0–100.0)
Platelets: 267 10*3/uL (ref 150–400)
RBC: 4.06 MIL/uL (ref 3.87–5.11)
RDW: 11.3 % — ABNORMAL LOW (ref 11.5–15.5)
WBC: 12.8 10*3/uL — ABNORMAL HIGH (ref 4.0–10.5)
nRBC: 0 % (ref 0.0–0.2)

## 2021-06-30 LAB — BASIC METABOLIC PANEL
Anion gap: 5 (ref 5–15)
BUN: 9 mg/dL (ref 8–23)
CO2: 27 mmol/L (ref 22–32)
Calcium: 9.5 mg/dL (ref 8.9–10.3)
Chloride: 103 mmol/L (ref 98–111)
Creatinine, Ser: 0.57 mg/dL (ref 0.44–1.00)
GFR, Estimated: >60 mL/min (ref >60)
Glucose, Bld: 142 mg/dL — ABNORMAL HIGH (ref 70–99)
Potassium: 3.8 mmol/L (ref 3.5–5.1)
Sodium: 135 mmol/L (ref 135–145)

## 2021-06-30 MED ORDER — ASPIRIN 81 MG PO CHEW: 81.0000 mg | CHEWABLE_TABLET | Freq: Two times a day (BID) | ORAL | 0 refills | Status: DC

## 2021-06-30 MED ORDER — METHOCARBAMOL 500 MG PO TABS: 500.0000 mg | ORAL_TABLET | Freq: Four times a day (QID) | ORAL | 1 refills | Status: DC | PRN

## 2021-06-30 MED ORDER — OXYCODONE HCL 5 MG PO TABS: 5.0000 mg | ORAL_TABLET | Freq: Four times a day (QID) | ORAL | 0 refills | Status: DC | PRN

## 2021-06-30 NOTE — Discharge Instructions (Signed)

## 2021-06-30 NOTE — Progress Notes (Signed)
Physical Therapy Treatment Patient Details Name: Anne Aguirre MRN: [id number] DOB: 1950/12/13 Today's Date: 06/30/2021   History of Present Illness Pt s/p L THR and with hx of hartmanns colostomy and reversal    PT Comments    Pt very cooperative and progressing well with mobility.  Pt up to bathroom for toileting, ambulated increased distance in hall, performed therex program and reviewed car transfers.  Pt eager for dc home this date.  Recommendations for follow up therapy are one component of a multi-disciplinary discharge planning process, led by the attending physician.  Recommendations may be updated based on patient status, additional functional criteria and insurance authorization.  Follow Up Recommendations  Home health PT     Assistance Recommended at Discharge Intermittent Supervision/Assistance  Patient can return home with the following A little help with walking and/or transfers;A little help with bathing/dressing/bathroom;Assistance with cooking/housework;Assist for transportation;Help with stairs or ramp for entrance   Equipment Recommendations  None recommended by PT    Recommendations for Other Services       Precautions / Restrictions Precautions Precautions: Fall Restrictions Weight Bearing Restrictions: No Other Position/Activity Restrictions: WBAT     Mobility  Bed Mobility Overal bed mobility: Needs Assistance Bed Mobility: Supine to Sit, Sit to Supine     Supine to sit: Min guard, Supervision Sit to supine: Min guard, Supervision   General bed mobility comments: cues for sequence and use of R LE to self assist    Transfers Overall transfer level: Needs assistance Equipment used: Rolling walker (2 wheels) Transfers: Sit to/from Stand Sit to Stand: Min guard, Supervision           General transfer comment: cues for LE management and use of UEs to self assist    Ambulation/Gait Ambulation/Gait assistance: Min guard, Supervision Gait  Distance (Feet): 150 Feet Assistive device: Rolling walker (2 wheels) Gait Pattern/deviations: Step-to pattern, Shuffle, Trunk flexed Gait velocity: decr     General Gait Details: cues for sequence, posture and position from [employer]             Wheelchair Mobility    Modified Rankin (Stroke Patients Only)       Balance Overall balance assessment: Needs assistance Sitting-balance support: No upper extremity supported Sitting balance-Leahy Scale: Good     Standing balance support: No upper extremity supported Standing balance-Leahy Scale: Fair                              Cognition Arousal/Alertness: Awake/alert Behavior During Therapy: WFL for tasks assessed/performed Overall Cognitive Status: Within Functional Limits for tasks assessed                                          Exercises Total Joint Exercises Ankle Circles/Pumps: AROM, Both, 15 reps, Supine Quad Sets: AROM, Both, 10 reps, Supine Heel Slides: AAROM, Left, 20 reps, Supine Hip ABduction/ADduction: AAROM, Left, 15 reps, Supine    General Comments        Pertinent Vitals/Pain Pain Assessment Pain Assessment: 0-10 Pain Score: 5  Pain Location: L hip Pain Descriptors / Indicators: Aching, Sore Pain Intervention(s): Limited activity within patient's tolerance, Monitored during session, Premedicated before session    Home Living  Prior Function            PT Goals (current goals can now be found in the care plan section) Acute Rehab PT Goals Patient Stated Goal: Regain IND PT Goal Formulation: With patient Time For Goal Achievement: 07/06/21 Potential to Achieve Goals: Good Progress towards PT goals: Progressing toward goals    Frequency    7X/week      PT Plan Current plan remains appropriate    Co-evaluation              AM-PAC PT "6 Clicks" Mobility   Outcome Measure  Help needed turning from your  back to your side while in a flat bed without using bedrails?: A Little Help needed moving from lying on your back to sitting on the side of a flat bed without using bedrails?: A Little Help needed moving to and from a bed to a chair (including a wheelchair)?: A Little Help needed standing up from a chair using your arms (e.g., wheelchair or bedside chair)?: A Little Help needed to walk in hospital room?: A Little Help needed climbing 3-5 steps with a railing? : A Little 6 Click Score: 18    End of Session Equipment Utilized During Treatment: Gait belt Activity Tolerance: Patient tolerated treatment well Patient left: in bed;with call bell/phone within reach Nurse Communication: Mobility status PT Visit Diagnosis: Difficulty in walking, not elsewhere classified (R26.2)     Time: 2025-4270 PT Time Calculation (min) (ACUTE ONLY): 34 min  Charges:  $Gait Training: 8-22 mins $Therapeutic Exercise: 8-22 mins                     Mauro Kaufmann PT Acute Rehabilitation Services Pager [phone] Office [phone]    Vint Pola 06/30/2021, 9:57 AM

## 2021-06-30 NOTE — Progress Notes (Signed)
Pt alert and oriented. Surgical dressing clean, dry and intact. No queries or concerns regarding discharge instructions. Pt belongings sent with pt.

## 2021-06-30 NOTE — Discharge Summary (Signed)
Patient ID: Anne Aguirre MRN: [id number] DOB/AGE: [birth year] 71 y.o.  Admit date: 06/29/2021 Discharge date: 06/30/2021  Admission Diagnoses:  Principal Problem:   Unilateral primary osteoarthritis, left hip Active Problems:   Status post total replacement of left hip   Discharge Diagnoses:  Same  Past Medical History:  Diagnosis Date   Acute diverticulitis 08/23/2020   Anemia    COVID-19 virus infection ---- +ve on 09/01/2020 09/10/2020   Essential hypertension, benign 10/01/2018   HLD (hyperlipidemia) 10/01/2018   Osteoarthritis 10/01/2018   Post-op intraabdominal/Liver abscess-Had Hartmann's with Colostomy on 08/29/2020 for Sigmoid Diverticulits 09/10/2020   s/p ex lap and Hartman's procedure with colostomy by AR 08/29/20 for diverticulitis with abscess-- Now with Liver/intrabdominal abscess---   Sepsis Due to Intraabdomonal Abscess 09/10/2020   Vitamin D deficiency disease 10/01/2018    Surgeries: Procedure(s): LEFT TOTAL HIP ARTHROPLASTY ANTERIOR APPROACH on 06/29/2021   Consultants:   Discharged Condition: Improved  Hospital Course: Justyna Timoney is an 71 y.o. female who was admitted 06/29/2021 for operative treatment ofUnilateral primary osteoarthritis, left hip. Patient has severe unremitting pain that affects sleep, daily activities, and work/hobbies. After pre-op clearance the patient was taken to the operating room on 06/29/2021 and underwent  Procedure(s): LEFT TOTAL HIP ARTHROPLASTY ANTERIOR APPROACH.    Patient was given perioperative antibiotics:  Anti-infectives (From admission, onward)    Start     Dose/Rate Route Frequency Ordered Stop   06/29/21 1315  ceFAZolin (ANCEF) IVPB 1 g/50 mL premix        1 g 100 mL/hr over 30 Minutes Intravenous Every 6 hours 06/29/21 1028 06/29/21 2001   06/29/21 0600  ceFAZolin (ANCEF) IVPB 2g/100 mL premix        2 g 200 mL/hr over 30 Minutes Intravenous On call to O.R. 06/29/21 9381 06/29/21 0734        Patient was given  sequential compression devices, early ambulation, and chemoprophylaxis to prevent DVT.  Patient benefited maximally from hospital stay and there were no complications.    Recent vital signs: Patient Vitals for the past 24 hrs:  BP Temp Temp src Pulse Resp SpO2  06/30/21 0813 131/69 -- -- 71 -- --  06/30/21 0520 (!) 143/76 98.9 F (37.2 C) Oral 80 20 98 %  06/30/21 0104 133/70 98.6 F (37 C) Oral 76 18 98 %  06/29/21 2205 (!) 145/69 98.5 F (36.9 C) Oral 72 20 99 %  06/29/21 1706 133/71 98.2 F (36.8 C) Oral 71 16 97 %  06/29/21 1235 123/68 97.9 F (36.6 C) Oral 67 18 97 %  06/29/21 1024 133/78 -- -- 69 -- 100 %  06/29/21 1000 118/64 -- -- 64 17 95 %  06/29/21 0945 118/66 98 F (36.7 C) -- (!) 59 12 94 %  06/29/21 0930 115/64 -- -- (!) 59 12 97 %     Recent laboratory studies:  Recent Labs    06/30/21 0313  WBC 12.8*  HGB 13.0  HCT 37.5  PLT 267  NA 135  K 3.8  CL 103  CO2 27  BUN 9  CREATININE 0.57  GLUCOSE 142*  CALCIUM 9.5     Discharge Medications:   Allergies as of 06/30/2021   No Known Allergies      Medication List     STOP taking these medications    tiZANidine 4 MG tablet Commonly known as: ZANAFLEX       TAKE these medications    acetaminophen 500 MG tablet Commonly known as: TYLENOL Take  1,000 mg by mouth in the morning and at bedtime.   amLODipine 10 MG tablet Commonly known as: NORVASC Take 1 tablet (10 mg total) by mouth daily.   aspirin 81 MG chewable tablet Chew 1 tablet (81 mg total) by mouth 2 (two) times daily.   gabapentin 100 MG capsule Commonly known as: NEURONTIN TAKE 1 CAPSULE BY MOUTH EVERY 8 HOURS AS NEEDED   methocarbamol 500 MG tablet Commonly known as: ROBAXIN Take 1 tablet (500 mg total) by mouth every 6 (six) hours as needed for muscle spasms.   multivitamin with minerals Tabs tablet Take 1 tablet by mouth daily.   Nebivolol HCl 20 MG Tabs Take 1 tablet (20 mg total) by mouth daily at 12 noon.    oxyCODONE 5 MG immediate release tablet Commonly known as: Oxy IR/ROXICODONE Take 1-2 tablets (5-10 mg total) by mouth every 6 (six) hours as needed for moderate pain (pain score 4-6).   Salonpas 3.01-20-08 % Ptch Generic drug: Camphor-Menthol-Methyl Sal Place 1 patch onto the skin daily as needed (pain).               Durable Medical Equipment  (From admission, onward)           Start     Ordered   06/29/21 1029  DME 3 n 1  Once        06/29/21 1028   06/29/21 1029  DME Walker rolling  Once       Question Answer Comment  Walker: With 5 Inch Wheels   Patient needs a walker to treat with the following condition Status post total replacement of left hip      06/29/21 1028            Diagnostic Studies: DG Pelvis Portable  Result Date: 06/29/2021 CLINICAL DATA:  Status post total left hip arthroplasty. EXAM: PORTABLE PELVIS 1-2 VIEWS COMPARISON:  Left hip radiographs 03/19/2021 FINDINGS: Interval total left hip arthroplasty. No perihardware lucency is seen to indicate hardware failure or loosening. Expected postoperative changes including left hip soft tissue swelling and mild subcutaneous air. Lateral surgical skin staples. Mild right femoroacetabular joint space narrowing. IMPRESSION: Interval total left hip arthroplasty without evidence of hardware failure. Electronically Signed   By: Neita Garnet M.D.   On: 06/29/2021 09:23   DG HIP UNILAT WITH PELVIS 1V LEFT  Result Date: 06/29/2021 CLINICAL DATA:  Status post left total hip arthroplasty. EXAM: DG HIP (WITH OR WITHOUT PELVIS) 1V*L* COMPARISON:  Left hip radiographs 03/19/2021 FLUOROSCOPY: Fluoroscopy Time: 12 seconds Radiation Exposure Index: 1.31 mGy FINDINGS: Three intraoperative spot fluoroscopic images are provided during left total hip arthroplasty. The prosthetic components appear normally positioned on these limited images, and no gross fracture is identified. IMPRESSION: Intraoperative images during left total  hip arthroplasty. Electronically Signed   By: Sebastian Ache M.D.   On: 06/29/2021 08:55   DG C-Arm 1-60 Min-No Report  Result Date: 06/29/2021 Fluoroscopy was utilized by the requesting physician.  No radiographic interpretation.    Disposition: Discharge disposition: 06-Home-Health Care Svc          Follow-up Information     [facility] Follow up.   Specialty: Home Health Services Why: to provide home physical therapy visits Contact information: [address] [place] [address] [phone]         Kathryne Hitch, MD Follow up in 2 week(s).   Specialty: Orthopedic Surgery Contact information: [address] [place] [address] [phone]  SignedRichardean Canal 06/30/2021, 9:16 AM

## 2021-06-30 NOTE — Progress Notes (Signed)
Subjective: 1 Day Post-Op Procedure(s) (LRB): LEFT TOTAL HIP ARTHROPLASTY ANTERIOR APPROACH (Left) Patient reports pain as mild.  Progressing well with PT. No complaints wants to discharge to home later today.   Objective: Vital signs in last 24 hours: Temp:  [97.9 F (36.6 C)-98.9 F (37.2 C)] 98.9 F (37.2 C) (06/17 0520) Pulse Rate:  [58-80] 71 (06/17 0813) Resp:  [11-20] 20 (06/17 0520) BP: (93-145)/(57-78) 131/69 (06/17 0813) SpO2:  [93 %-100 %] 98 % (06/17 0520)  Intake/Output from previous day: 06/16 0701 - 06/17 0700 In: 3162.6 [P.O.:840; I.V.:2172.6; IV Piggyback:150] Out: 6675 [Urine:6525; Blood:150] Intake/Output this shift: Total I/O In: 120 [P.O.:120] Out: -   Recent Labs    06/30/21 0313  HGB 13.0   Recent Labs    06/30/21 0313  WBC 12.8*  RBC 4.06  HCT 37.5  PLT 267   Recent Labs    06/30/21 0313  NA 135  K 3.8  CL 103  CO2 27  BUN 9  CREATININE 0.57  GLUCOSE 142*  CALCIUM 9.5   No results for input(s): "LABPT", "INR" in the last 72 hours.  Sensation intact distally Intact pulses distally Dorsiflexion/Plantar flexion intact Incision: scant drainage Compartment soft   Assessment/Plan: 1 Day Post-Op Procedure(s) (LRB): LEFT TOTAL HIP ARTHROPLASTY ANTERIOR APPROACH (Left) Up with therapy Discharge home with home health      Anne Aguirre 06/30/2021, 8:22 AM

## 2021-06-30 NOTE — Plan of Care (Signed)
  Problem: Education: Goal: Knowledge of General Education information will improve Description: Including pain rating scale, medication(s)/side effects and non-pharmacologic comfort measures Outcome: Progressing   Problem: Pain Managment: Goal: General experience of comfort will improve Outcome: Progressing   Problem: Safety: Goal: Ability to remain free from injury will improve Outcome: Progressing   Problem: Activity: Goal: Ability to avoid complications of mobility impairment will improve Outcome: Progressing   Problem: Clinical Measurements: Goal: Postoperative complications will be avoided or minimized Outcome: Progressing

## 2021-07-02 ENCOUNTER — Encounter (HOSPITAL_COMMUNITY): Payer: Self-pay | Admitting: Orthopaedic Surgery

## 2021-07-02 ENCOUNTER — Telehealth: Payer: Self-pay | Admitting: *Deleted

## 2021-07-02 NOTE — Telephone Encounter (Signed)
Attempted Ortho bundle D/C call to patient. No answer and left VM requesting call back. °

## 2021-07-03 ENCOUNTER — Telehealth: Payer: Self-pay | Admitting: *Deleted

## 2021-07-03 NOTE — Telephone Encounter (Signed)
Ortho bundle discharge call completed. 

## 2021-07-09 ENCOUNTER — Telehealth: Payer: Self-pay | Admitting: *Deleted

## 2021-07-12 ENCOUNTER — Ambulatory Visit (INDEPENDENT_AMBULATORY_CARE_PROVIDER_SITE_OTHER): Payer: Medicare PPO | Admitting: Orthopaedic Surgery

## 2021-07-12 ENCOUNTER — Encounter: Payer: Self-pay | Admitting: Orthopaedic Surgery

## 2021-07-12 ENCOUNTER — Telehealth: Payer: Self-pay | Admitting: *Deleted

## 2021-07-12 DIAGNOSIS — Z96642 Presence of left artificial hip joint: Secondary | ICD-10-CM

## 2021-07-12 NOTE — Telephone Encounter (Signed)
Ortho bundle 14 day in office meeting completed. °

## 2021-07-12 NOTE — Progress Notes (Signed)
The patient is a 71 year old female who is 2 weeks status post a left total hip arthroplasty.  She is already ambulating without an assistive device and not taking any pain medications at all.  She has been compliant with a baby aspirin twice a day.  She denies any calf pain or foot and ankle swelling.  She has almost a normal-appearing gait today even at just 2 weeks.  Her left operative hip moves smoothly.  Her staples were removed and Steri-Strips applied.  There is still some swelling and subjective numbness to be expected but that should get better with time.  Her calf is soft and it was not swollen.  She can stop aspirin from my standpoint.  She will continue increase her activities as tolerated.  I will see her back in 4 weeks to see how she is doing overall but no x-rays are needed.

## 2021-07-16 ENCOUNTER — Other Ambulatory Visit: Payer: Self-pay | Admitting: Orthopedic Surgery

## 2021-07-18 MED ORDER — GABAPENTIN 100 MG PO CAPS: ORAL_CAPSULE | ORAL | 0 refills | Status: DC

## 2021-08-09 ENCOUNTER — Ambulatory Visit (INDEPENDENT_AMBULATORY_CARE_PROVIDER_SITE_OTHER): Payer: Medicare PPO | Admitting: Orthopaedic Surgery

## 2021-08-09 ENCOUNTER — Telehealth: Payer: Self-pay | Admitting: *Deleted

## 2021-08-09 ENCOUNTER — Encounter: Payer: Self-pay | Admitting: Orthopaedic Surgery

## 2021-08-09 DIAGNOSIS — Z96642 Presence of left artificial hip joint: Secondary | ICD-10-CM

## 2021-08-09 NOTE — Telephone Encounter (Signed)
Ortho bundle 30 day meeting and survey completed. 

## 2021-08-09 NOTE — Progress Notes (Signed)
The patient is 6 weeks status post a left total hip arthroplasty.  She is walking without assistive device.  She reports excellent strength and range of motion of her left hip and is very pleased overall.  She is walking without a limp as well.  Her left operative hip moves smoothly and fluidly.  Her leg lengths are equal.  She will continue to increase her activities as comfort allows.  There are no restrictions for her.  The next time I need to see her is not for 6 months unless there are issues.  At that visit we will have a standing low AP pelvis and lateral of her left operative hip.

## 2021-08-16 ENCOUNTER — Encounter: Payer: Self-pay | Admitting: Orthopedic Surgery

## 2021-08-16 ENCOUNTER — Ambulatory Visit: Payer: Medicare PPO | Admitting: Orthopedic Surgery

## 2021-08-16 ENCOUNTER — Other Ambulatory Visit: Payer: Self-pay | Admitting: Orthopedic Surgery

## 2021-08-16 DIAGNOSIS — M5136 Other intervertebral disc degeneration, lumbar region: Secondary | ICD-10-CM

## 2021-08-16 MED ORDER — GABAPENTIN 100 MG PO CAPS: ORAL_CAPSULE | ORAL | 0 refills | Status: DC

## 2021-08-16 NOTE — Patient Instructions (Signed)
Physical therapy has been ordered for [facility] at [facility]. They should call you to schedule, [phone] is the phone number to call, if you want to call to schedule.   

## 2021-08-16 NOTE — Progress Notes (Signed)
Chief Complaint  Patient presents with   Back Pain    F/u//LBP is a little worse/ had MRI in March Had THA LT side 06/27/21 (DR.Magnus Ivan)   71 year old female status post closure of ostomy followed by left total hip in [place] presents now for reevaluation and continued care of her lower spine issues.  She has some mild leg pain and some radiculopathy   MRI lumbar: L3-4: Disc degeneration is asymmetric to the left with disc space narrowing and spurring and fatty endplate changes on the left. Moderate subarticular and foraminal stenosis on the left due to spurring. Spinal canal adequate in size. Right foramen widely patent   L4-5: Advanced disc degeneration with disc space narrowing and chronic endplate changes compatible with sclerosis and edema. Diffuse endplate spurring. Mild facet degeneration. Mild subarticular stenosis on the right. Spinal canal adequate in size   L5-S1: Moderate to advanced disc degeneration with disc space narrowing. Diffuse endplate spurring and bilateral facet degeneration right greater than left. Severe right foraminal encroachment. Right L5 nerve root impingement.   IMPRESSION: Lumbar scoliosis and multilevel degenerative change as described above   Moderate subarticular and foraminal stenosis on the left at L3-4   Marked right foraminal encroachment L5-S1 with right L5 nerve root impingement.     Electronically Signed   By: Marlan Palau M.D.  Encounter Diagnosis  Name Primary?   Degenerative disc disease, lumbar Yes     She is interested now in going ahead and getting the therapy done on the lumbar spine  She will continue on gabapentin and Robaxin  Follow-up with me in 4 weeks

## 2021-08-17 ENCOUNTER — Other Ambulatory Visit: Payer: Self-pay | Admitting: Orthopedic Surgery

## 2021-08-31 ENCOUNTER — Other Ambulatory Visit: Payer: Self-pay | Admitting: Orthopedic Surgery

## 2021-08-31 DIAGNOSIS — M5136 Other intervertebral disc degeneration, lumbar region: Secondary | ICD-10-CM

## 2021-09-10 ENCOUNTER — Other Ambulatory Visit: Payer: Self-pay | Admitting: Orthopedic Surgery

## 2021-09-10 MED ORDER — GABAPENTIN 100 MG PO CAPS: ORAL_CAPSULE | ORAL | 0 refills | Status: DC

## 2021-09-10 MED ORDER — TIZANIDINE HCL 4 MG PO TABS: 4.0000 mg | ORAL_TABLET | Freq: Three times a day (TID) | ORAL | 0 refills | Status: DC

## 2021-09-12 ENCOUNTER — Other Ambulatory Visit: Payer: Self-pay | Admitting: Orthopedic Surgery

## 2021-09-12 MED ORDER — TIZANIDINE HCL 4 MG PO TABS: 4.0000 mg | ORAL_TABLET | Freq: Three times a day (TID) | ORAL | 0 refills | Status: DC

## 2021-09-12 MED ORDER — GABAPENTIN 100 MG PO CAPS: ORAL_CAPSULE | ORAL | 0 refills | Status: DC

## 2021-09-18 DIAGNOSIS — B9729 Other coronavirus as the cause of diseases classified elsewhere: Secondary | ICD-10-CM | POA: Diagnosis not present

## 2021-09-18 DIAGNOSIS — R070 Pain in throat: Secondary | ICD-10-CM | POA: Diagnosis not present

## 2021-09-26 ENCOUNTER — Ambulatory Visit (HOSPITAL_COMMUNITY): Payer: Medicare PPO

## 2021-10-02 ENCOUNTER — Other Ambulatory Visit (HOSPITAL_BASED_OUTPATIENT_CLINIC_OR_DEPARTMENT_OTHER): Payer: Self-pay | Admitting: Family Medicine

## 2021-10-02 DIAGNOSIS — I10 Essential (primary) hypertension: Secondary | ICD-10-CM

## 2021-10-06 ENCOUNTER — Other Ambulatory Visit: Payer: Self-pay | Admitting: Orthopedic Surgery

## 2021-10-08 MED ORDER — TIZANIDINE HCL 4 MG PO TABS: 4.0000 mg | ORAL_TABLET | Freq: Three times a day (TID) | ORAL | 0 refills | Status: DC

## 2021-10-08 MED ORDER — GABAPENTIN 100 MG PO CAPS: ORAL_CAPSULE | ORAL | 0 refills | Status: DC

## 2021-10-09 ENCOUNTER — Other Ambulatory Visit: Payer: Self-pay | Admitting: Orthopedic Surgery

## 2021-10-09 MED ORDER — GABAPENTIN 100 MG PO CAPS: ORAL_CAPSULE | ORAL | 0 refills | Status: DC

## 2021-10-11 ENCOUNTER — Encounter (HOSPITAL_COMMUNITY): Payer: Self-pay | Admitting: Physical Therapy

## 2021-10-11 ENCOUNTER — Ambulatory Visit: Payer: Medicare PPO | Admitting: Orthopedic Surgery

## 2021-10-11 ENCOUNTER — Ambulatory Visit (HOSPITAL_COMMUNITY): Payer: Medicare PPO | Attending: Orthopedic Surgery | Admitting: Physical Therapy

## 2021-10-11 DIAGNOSIS — M545 Low back pain, unspecified: Secondary | ICD-10-CM | POA: Insufficient documentation

## 2021-10-11 DIAGNOSIS — M5136 Other intervertebral disc degeneration, lumbar region: Secondary | ICD-10-CM | POA: Insufficient documentation

## 2021-10-11 DIAGNOSIS — M6281 Muscle weakness (generalized): Secondary | ICD-10-CM | POA: Insufficient documentation

## 2021-10-11 NOTE — Therapy (Signed)
OUTPATIENT PHYSICAL THERAPY THORACOLUMBAR EVALUATION   Patient Name: [facility] MRN: [id number] DOB:04-22-1950, 71 y.o., female Today's Date: 10/11/2021   PT End of Session - 10/11/21 0811     Visit Number 1    Number of Visits 12    Date for PT Re-Evaluation 11/22/21    Authorization Type [employer]    Authorization Time Period (Auth sent for 12 visits- 6 weeks) "No VL"    Progress Note Due on Visit 10    PT Start Time 1031    PT Stop Time 1115    PT Time Calculation (min) 44 min    Activity Tolerance Patient tolerated treatment well    Behavior During Therapy [facility] for tasks assessed/performed             Past Medical History:  Diagnosis Date   Acute diverticulitis 08/23/2020   Anemia    COVID-19 virus infection ---- +ve on 09/01/2020 09/10/2020   Essential hypertension, benign 10/01/2018   HLD (hyperlipidemia) 10/01/2018   Osteoarthritis 10/01/2018   Post-op intraabdominal/Liver abscess-Had Hartmann's with Colostomy on 08/29/2020 for Sigmoid Diverticulits 09/10/2020   s/p ex lap and Hartman's procedure with colostomy by AR 08/29/20 for diverticulitis with abscess-- Now with Liver/intrabdominal abscess---   Sepsis Due to Intraabdomonal Abscess 09/10/2020   Vitamin D deficiency disease 10/01/2018   Past Surgical History:  Procedure Laterality Date   COLON RESECTION N/A 08/29/2020   Procedure: COLON RESECTION;  Surgeon: Axel Filler, MD;  Location: [facility] OR;  Service: General;  Laterality: N/A;   COLOSTOMY N/A 08/29/2020   Procedure: COLOSTOMY;  Surgeon: Axel Filler, MD;  Location: [facility] OR;  Service: General;  Laterality: N/A;   LAPAROTOMY N/A 08/29/2020   Procedure: EXPLORATORY LAPAROTOMY;  Surgeon: Axel Filler, MD;  Location: [facility] OR;  Service: General;  Laterality: N/A;   LYSIS OF ADHESION N/A 04/25/2021   Procedure: LYSIS OF ADHESION;  Surgeon: Andria Meuse, MD;  Location: [facility] ORS;  Service: General;  Laterality: N/A;   TONSILLECTOMY Bilateral     TOTAL HIP ARTHROPLASTY Left 06/29/2021   Procedure: LEFT TOTAL HIP ARTHROPLASTY ANTERIOR APPROACH;  Surgeon: Kathryne Hitch, MD;  Location: [facility] ORS;  Service: Orthopedics;  Laterality: Left;   XI ROBOTIC ASSISTED COLOSTOMY TAKEDOWN N/A 04/25/2021   Procedure: XI ROBOTIC ASSISTED COLOSTOMY TAKEDOWN WITH LOW ANTERIOR RESECTION, WITH BILATERAL TAP BLOCK, FLEXIBLE SIGMOIDOSCOPY, AND INTRAOPERATIVE ASSESSMENT OF PERFUSION USING FIREFLY;  Surgeon: Andria Meuse, MD;  Location: [facility] ORS;  Service: General;  Laterality: N/A;   Patient Active Problem List   Diagnosis Date Noted   Status post total replacement of left hip 06/29/2021   Hospital discharge follow-up 05/14/2021   S/P colostomy takedown 04/25/2021   Unilateral primary osteoarthritis, left hip 04/23/2021   Diverticulitis of colon 01/09/2021   Status post Hartmann procedure [facility]) 10/30/2020   Thrombocytosis 10/19/2020   Colostomy in place [facility]) 09/21/2020   Anemia 09/21/2020   Physical deconditioning 09/21/2020   Essential hypertension, benign 10/01/2018   HLD (hyperlipidemia) 10/01/2018   Vitamin D deficiency disease 10/01/2018   Osteoarthritis 10/01/2018    PCP: Trena Platt, MD  REFERRING PROVIDER: Vickki Hearing, MD   REFERRING DIAG: M51.36 (ICD-10-CM) - Degenerative disc disease, lumbar   Rationale for Evaluation and Treatment Rehabilitation  THERAPY DIAG:  Low back pain, unspecified back pain laterality, unspecified chronicity, unspecified whether sciatica present  Muscle weakness (generalized)  ONSET DATE: 15 years ago; chronic, no new changes reported  SUBJECTIVE:  SUBJECTIVE STATEMENT: Patient reports back pain for 15 years. Had an MRI recently showing degenerative changes and foraminal stenosis. Denies radiculopathy.  States THA in June improved some of the symptoms she was having. Difficulty navigating stairs, limited to one flight.  PERTINENT HISTORY:  Abdominal hernia, colostomy take-down April 2023. THA Lt June 2023.   PAIN:  Are you having pain? Yes: NPRS scale: 0/10 current; at worst 7/10 Pain location: back Pain description: achy Aggravating factors: bending, lifting, stairs Relieving factors: lying flat, heat, tylenol   PRECAUTIONS: Back States no bending and no lifting more than a gallon of milk per Dr. [name]  WEIGHT BEARING RESTRICTIONS No  FALLS:  Has patient fallen in last 6 months? No  LIVING ENVIRONMENT: Lives with: lives with their family and lives with their spouse Lives in: House/apartment Stairs: No Has following equipment at home: Gilford Rile - 2 wheeled and Wheelchair (manual)  OCCUPATION: Retired; Financial planner and cooking hobbies.  PLOF: Independent  PATIENT GOALS Reduce pain, be able to bend, navigate stairs easier   OBJECTIVE:   DIAGNOSTIC FINDINGS:  IMPRESSION: Lumbar scoliosis and multilevel degenerative change as described above   Moderate subarticular and foraminal stenosis on the left at L3-4   Marked right foraminal encroachment L5-S1 with right L5 nerve root impingement.  PATIENT SURVEYS:  FOTO 56%  SCREENING FOR RED FLAGS: Bowel or bladder incontinence: No Spinal tumors: No Cauda equina syndrome: No Compression fracture: No Abdominal aneurysm: No  COGNITION:  Overall cognitive status: Within functional limits for tasks assessed     SENSATION: WFL  POSTURE: rounded shoulders, forward head, and increased thoracic kyphosis  LUMBAR ROM:   Active  A/PROM  eval  Flexion toes  Extension Limited 25%  Right lateral flexion WNL  Left lateral flexion 2" short of Rt flexion  Right rotation Limited 15%  Left rotation WNL   (Blank rows = not tested)  LOWER EXTREMITY ROM:     WFL  LOWER EXTREMITY MMT:    MMT Right eval Left eval  Hip  flexion 4+ 4+  Hip extension 4+ 4  Hip abduction 4+ 4+  Hip adduction 5 5  Hip internal rotation    Hip external rotation 4 4-  Knee flexion 5 5  Knee extension 5 5  Ankle dorsiflexion 5 5  Ankle plantarflexion    Ankle inversion    Ankle eversion     (Blank rows = not tested)  LUMBAR SPECIAL TESTS:  Slump test: Positive Left  FUNCTIONAL TESTS:  5 times sit to stand: 13.2   TODAY'S TREATMENT  Eval MMT, 5 time sit to stand, ROM Education HEP reviewed and provided handout (see below)   PATIENT EDUCATION:  Education details: HEP, lifting mechanics, symptom awareness Person educated: Patient Education method: Explanation Education comprehension: verbalized understanding   HOME EXERCISE PROGRAM: Access Code: [email or web address] URL: [email or web address] Date: 10/11/2021 Prepared by: Candie Mile  Exercises - Supine Transversus Abdominis Bracing - Hands on Stomach  - 3 x daily - 7 x weekly - 3 sets - 10 reps - Small Range Straight Leg Raise  - 1 x daily - 7 x weekly - 2-3 sets - 10 reps - Supine Bridge  - 1 x daily - 7 x weekly - 2-3 sets - 10 reps - Clamshell  - 1 x daily - 7 x weekly - 2-3 sets - 10 reps - Supine Lower Trunk Rotation  - 1 x daily - 7 x weekly - 10 reps - Supine Pelvic Tilt  - 1 x daily -  7 x weekly - 10 reps  ASSESSMENT:  CLINICAL IMPRESSION: Patient is a 71 y.o. female who was seen today for physical therapy evaluation and treatment for lower back pain. Patient presents with deficits including reduced strength, limited range of motion,  limited activity tolerance, impaired lifting mechanics, and pain, contributing to impaired functional mobility with ADLs and IADLs. Patient is currently restricted in ADLs as indicated by objective and subjective functional outcome measures, as well as reported history and objective measures taken during this exam. Patient will benefit from skilled physical therapy intervention in order to improve function and  reduce the impairments listed above.    OBJECTIVE IMPAIRMENTS decreased activity tolerance, decreased knowledge of condition, decreased mobility, decreased ROM, decreased strength, hypomobility, increased fascial restrictions, impaired flexibility, improper body mechanics, postural dysfunction, and pain.   ACTIVITY LIMITATIONS carrying, lifting, bending, squatting, stairs, and locomotion level  PARTICIPATION LIMITATIONS: cleaning, laundry, shopping, community activity, and yard work  PERSONAL FACTORS Age and Time since onset of injury/illness/exacerbation  hx of multiple abdominal surgeries are also affecting patient's functional outcome.   REHAB POTENTIAL: Excellent  CLINICAL DECISION MAKING: Stable/uncomplicated  EVALUATION COMPLEXITY: Low   GOALS: Goals reviewed with patient? Yes  SHORT TERM GOALS: Target date:   Patient will be independent with initial HEP and self-management strategies to improve functional outcomes Baseline: Initiated Goal status: INITIAL    LONG TERM GOALS: Target date: 11/01/21  Patient will be independent with advanced HEP and self-management strategies to improve functional outcomes Baseline:  Goal status: INITIAL  2.  Patient will improve FOTO score to predicted value of 65% to indicate improvement in functional outcomes Baseline: 56% Goal status: INITIAL  3.  Patient will report reduction of back pain to <3/10 at worst for improved quality of life and ability to perform ADLs  Baseline: 7/10 at worst Goal status: INITIAL  4. Patient will have equal to or > 4+/5 MMT throughout BIL LEs to improve ability to perform functional mobility, stair ambulation and ADLs.  Baseline: see above Goal status: INITIAL    5. Patient will report ability to navigate at least 2 flights of steps with minimal difficulty and no increase in pain to enable increased integration in community and when  visiting family/friends.    Baseline: Can only complete one flight  of stairs, increases pain    Goal Status: INITIAL  PLAN: PT FREQUENCY: 2x/week  PT DURATION: 6 weeks  PLANNED INTERVENTIONS: Therapeutic exercises, Therapeutic activity, Neuromuscular re-education, Balance training, Gait training, Patient/Family education, Self Care, Joint mobilization, Joint manipulation, Stair training, Dry Needling, Spinal manipulation, Spinal mobilization, Cryotherapy, Moist heat, Taping, Traction, Ultrasound, Biofeedback, Ionotophoresis 4mg /ml Dexamethasone, Manual therapy, and Re-evaluation  PLAN FOR NEXT SESSION: Progress core strength, LE strength, tolerance with stairs, and lifting mechanics.  Goes to GI surgeon in November to address abdominal hernia.  December, PT, DPT Physical Therapist Acute Rehabilitation Services [facility]  10/11/2021, 12:17 PM

## 2021-11-08 ENCOUNTER — Other Ambulatory Visit: Payer: Self-pay | Admitting: Orthopedic Surgery

## 2021-11-08 MED ORDER — GABAPENTIN 100 MG PO CAPS: ORAL_CAPSULE | ORAL | 0 refills | Status: DC

## 2021-11-08 MED ORDER — TIZANIDINE HCL 4 MG PO TABS: 4.0000 mg | ORAL_TABLET | Freq: Three times a day (TID) | ORAL | 0 refills | Status: DC

## 2021-11-15 ENCOUNTER — Encounter: Payer: Self-pay | Admitting: Internal Medicine

## 2021-11-15 ENCOUNTER — Ambulatory Visit (INDEPENDENT_AMBULATORY_CARE_PROVIDER_SITE_OTHER): Payer: Medicare PPO | Admitting: Internal Medicine

## 2021-11-15 VITALS — BP 138/84 | HR 85 | Resp 18 | Ht 65.0 in | Wt 140.0 lb

## 2021-11-15 DIAGNOSIS — K439 Ventral hernia without obstruction or gangrene: Secondary | ICD-10-CM | POA: Diagnosis not present

## 2021-11-15 DIAGNOSIS — Z0001 Encounter for general adult medical examination with abnormal findings: Secondary | ICD-10-CM | POA: Diagnosis not present

## 2021-11-15 DIAGNOSIS — M5136 Other intervertebral disc degeneration, lumbar region: Secondary | ICD-10-CM | POA: Diagnosis not present

## 2021-11-15 DIAGNOSIS — E559 Vitamin D deficiency, unspecified: Secondary | ICD-10-CM | POA: Diagnosis not present

## 2021-11-15 DIAGNOSIS — Z96642 Presence of left artificial hip joint: Secondary | ICD-10-CM

## 2021-11-15 DIAGNOSIS — I10 Essential (primary) hypertension: Secondary | ICD-10-CM | POA: Diagnosis not present

## 2021-11-15 DIAGNOSIS — Z23 Encounter for immunization: Secondary | ICD-10-CM | POA: Diagnosis not present

## 2021-11-15 DIAGNOSIS — R739 Hyperglycemia, unspecified: Secondary | ICD-10-CM

## 2021-11-15 DIAGNOSIS — E782 Mixed hyperlipidemia: Secondary | ICD-10-CM

## 2021-11-15 DIAGNOSIS — M51369 Other intervertebral disc degeneration, lumbar region without mention of lumbar back pain or lower extremity pain: Secondary | ICD-10-CM

## 2021-11-15 MED ORDER — AMLODIPINE BESYLATE 10 MG PO TABS: 10.0000 mg | ORAL_TABLET | Freq: Every day | ORAL | 1 refills | Status: DC

## 2021-11-15 NOTE — Assessment & Plan Note (Signed)
Followed by orthopedic surgery Takes gabapentin Robaxin as needed Avoid heavy lifting and frequent bending Undergoing PT

## 2021-11-15 NOTE — Assessment & Plan Note (Signed)
BP Readings from Last 1 Encounters:  11/15/21 138/84   Well-controlled with amlodipine and nebivolol Counseled for compliance with the medications Advised DASH diet and moderate exercise/walking, at least 150 mins/week

## 2021-11-15 NOTE — Patient Instructions (Signed)
Please continue taking medications as prescribed.  Please continue to follow low salt diet and ambulate as tolerated.  Please consider getting Shingrix vaccine at local pharmacy. 

## 2021-11-15 NOTE — Assessment & Plan Note (Addendum)
Physical exam as documented. Fasting blood test today. Advised to get Shingrix vaccine at local pharmacy.

## 2021-11-15 NOTE — Progress Notes (Signed)
Established Patient Office Visit  Subjective:  Patient ID: Anne Aguirre, female    DOB: 02-Jun-1950  Age: 71 y.o. MRN: [id number]  CC:  Chief Complaint  Patient presents with   Annual Exam    Annual exam     HPI Anne Aguirre is a 71 y.o. female with past medical history of HTN, diverticulitis s/p colon resection and hip arthritis who presents for annual physical.  HTN: BP is well-controlled. Takes medications regularly. Patient denies headache, dizziness, chest pain, dyspnea or palpitations.  She had left THA in 06/23.  She denies any left hip pain currently.  She has chronic low back pain, for which she takes gabapentin and is getting PT.  She received flu and PCV 20 vaccines in the office today.  Past Medical History:  Diagnosis Date   Acute diverticulitis 08/23/2020   Anemia    Colostomy in place [facility]) 09/21/2020   COVID-19 virus infection ---- +ve on 09/01/2020 09/10/2020   Essential hypertension, benign 10/01/2018   HLD (hyperlipidemia) 10/01/2018   Osteoarthritis 10/01/2018   Post-op intraabdominal/Liver abscess-Had Hartmann's with Colostomy on 08/29/2020 for Sigmoid Diverticulits 09/10/2020   s/p ex lap and Hartman's procedure with colostomy by AR 08/29/20 for diverticulitis with abscess-- Now with Liver/intrabdominal abscess---   Sepsis Due to Intraabdomonal Abscess 09/10/2020   Vitamin D deficiency disease 10/01/2018    Past Surgical History:  Procedure Laterality Date   COLON RESECTION N/A 08/29/2020   Procedure: COLON RESECTION;  Surgeon: Ralene Ok, MD;  Location: [place];  Service: General;  Laterality: N/A;   COLOSTOMY N/A 08/29/2020   Procedure: COLOSTOMY;  Surgeon: Ralene Ok, MD;  Location: [place];  Service: General;  Laterality: N/A;   LAPAROTOMY N/A 08/29/2020   Procedure: EXPLORATORY LAPAROTOMY;  Surgeon: Ralene Ok, MD;  Location: [place];  Service: General;  Laterality: N/A;   LYSIS OF ADHESION N/A 04/25/2021   Procedure: LYSIS OF ADHESION;   Surgeon: Ileana Roup, MD;  Location: [facility];  Service: General;  Laterality: N/A;   TONSILLECTOMY Bilateral    TOTAL HIP ARTHROPLASTY Left 06/29/2021   Procedure: LEFT TOTAL HIP ARTHROPLASTY ANTERIOR APPROACH;  Surgeon: Mcarthur Rossetti, MD;  Location: [facility];  Service: Orthopedics;  Laterality: Left;   XI ROBOTIC ASSISTED COLOSTOMY TAKEDOWN N/A 04/25/2021   Procedure: XI ROBOTIC ASSISTED COLOSTOMY TAKEDOWN WITH LOW ANTERIOR RESECTION, WITH BILATERAL TAP BLOCK, FLEXIBLE SIGMOIDOSCOPY, AND INTRAOPERATIVE ASSESSMENT OF PERFUSION USING FIREFLY;  Surgeon: Ileana Roup, MD;  Location: [facility];  Service: General;  Laterality: N/A;    Family History  Problem Relation Age of Onset   Hypertension Mother    Stroke Mother    Stroke Father    Hypertension Brother    Hypertension Son     Social History   Socioeconomic History   Marital status: Married    Spouse name: Remo Lipps   Number of children: 2   Years of education: Not on file   Highest education level: Not on file  Occupational History   Not on file  Tobacco Use   Smoking status: Never   Smokeless tobacco: Never  Vaping Use   Vaping Use: Never used  Substance and Sexual Activity   Alcohol use: Yes    Alcohol/week: 7.0 standard drinks of alcohol    Types: 7 Glasses of wine per week    Comment: occasional wine   Drug use: Never   Sexual activity: Not on file  Other Topics Concern   Not on file  Social History Narrative  Married for 12 years.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   Social Determinants of Health   Financial Resource Strain: Low Risk  (11/27/2020)   Overall Financial Resource Strain (CARDIA)    Difficulty of Paying Living Expenses: Not hard at all  Food Insecurity: No Food Insecurity (11/27/2020)   Hunger Vital Sign    Worried About Running Out of Food in the Last Year: Never true    Ran Out of Food in the Last Year: Never true  Transportation Needs: No Transportation Needs (11/27/2020)    PRAPARE - Hydrologist (Medical): No    Lack of Transportation (Non-Medical): No  Physical Activity: Sufficiently Active (11/27/2020)   Exercise Vital Sign    Days of Exercise per Week: 5 days    Minutes of Exercise per Session: 30 min  Stress: No Stress Concern Present (11/27/2020)   [place]    Feeling of Stress : Not at all  Social Connections: Moderately Isolated (11/27/2020)   Social Connection and Isolation Panel [NHANES]    Frequency of Communication with Friends and Family: More than three times a week    Frequency of Social Gatherings with Friends and Family: More than three times a week    Attends Religious Services: Never    Marine scientist or Organizations: No    Attends Archivist Meetings: Never    Marital Status: Married  Human resources officer Violence: Not At Risk (11/27/2020)   Humiliation, Afraid, Rape, and Kick questionnaire    Fear of Current or Ex-Partner: No    Emotionally Abused: No    Physically Abused: No    Sexually Abused: No    Outpatient Medications Prior to Visit  Medication Sig Dispense Refill   acetaminophen (TYLENOL) 500 MG tablet Take 1,000 mg by mouth in the morning and at bedtime.     aspirin 81 MG chewable tablet Chew 1 tablet (81 mg total) by mouth 2 (two) times daily. 35 tablet 0   Camphor-Menthol-Methyl Sal (SALONPAS) 3.01-20-08 % PTCH Place 1 patch onto the skin daily as needed (pain).     gabapentin (NEURONTIN) 100 MG capsule TAKE 1 CAPSULE BY MOUTH EVERY 8 HOURS AS NEEDED 90 capsule 0   methocarbamol (ROBAXIN) 500 MG tablet Take 1 tablet (500 mg total) by mouth every 6 (six) hours as needed for muscle spasms. 40 tablet 1   Multiple Vitamin (MULTIVITAMIN WITH MINERALS) TABS tablet Take 1 tablet by mouth daily.     Nebivolol HCl 20 MG TABS Take 1 tablet (20 mg total) by mouth daily at 12 noon. 90 tablet 1   tiZANidine (ZANAFLEX) 4 MG  tablet Take 1 tablet (4 mg total) by mouth 3 (three) times daily. 60 tablet 0   amLODipine (NORVASC) 10 MG tablet Take 1 tablet by mouth once daily 90 tablet 0   oxyCODONE (OXY IR/ROXICODONE) 5 MG immediate release tablet Take 1-2 tablets (5-10 mg total) by mouth every 6 (six) hours as needed for moderate pain (pain score 4-6). (Patient not taking: Reported on 11/15/2021) 30 tablet 0   No facility-administered medications prior to visit.    No Known Allergies  ROS Review of Systems  Constitutional:  Negative for chills and fever.  HENT:  Negative for congestion, sinus pressure, sinus pain and sore throat.   Eyes:  Negative for pain and discharge.  Respiratory:  Negative for cough and shortness of breath.   Cardiovascular:  Negative for chest pain and palpitations.  Gastrointestinal:  Negative for diarrhea, nausea and vomiting.  Endocrine: Negative for polydipsia and polyuria.  Genitourinary:  Negative for dysuria and hematuria.  Musculoskeletal:  Positive for back pain. Negative for neck pain and neck stiffness.  Skin:  Negative for rash.  Neurological:  Negative for dizziness and weakness.  Psychiatric/Behavioral:  Negative for agitation and behavioral problems.       Objective:    Physical Exam Vitals reviewed.  Constitutional:      General: She is not in acute distress.    Appearance: She is not diaphoretic.  HENT:     Head: Normocephalic and atraumatic.     Nose: Nose normal.     Mouth/Throat:     Mouth: Mucous membranes are moist.  Eyes:     General: No scleral icterus.    Extraocular Movements: Extraocular movements intact.  Cardiovascular:     Rate and Rhythm: Normal rate and regular rhythm.     Pulses: Normal pulses.     Heart sounds: Normal heart sounds. No murmur heard. Pulmonary:     Breath sounds: Normal breath sounds. No wheezing or rales.  Abdominal:     Palpations: Abdomen is soft.     Tenderness: There is no abdominal tenderness.     Hernia: A hernia  (Ventral) is present.     Comments: Incision site C/D/I  Musculoskeletal:     Cervical back: Neck supple. No tenderness.     Right lower leg: No edema.     Left lower leg: No edema.  Skin:    General: Skin is warm.     Findings: No rash.  Neurological:     General: No focal deficit present.     Mental Status: She is alert and oriented to person, place, and time.     Sensory: No sensory deficit.     Motor: No weakness.  Psychiatric:        Mood and Affect: Mood normal.        Behavior: Behavior normal.     BP 138/84 (BP Location: Right Arm, Patient Position: Sitting, Cuff Size: Normal)   Pulse 85   Resp 18   Ht 5' 5" (1.651 m)   Wt 140 lb (63.5 kg)   SpO2 97%   BMI 23.30 kg/m  Wt Readings from Last 3 Encounters:  11/15/21 140 lb (63.5 kg)  06/29/21 128 lb (58.1 kg)  06/20/21 128 lb (58.1 kg)    Lab Results  Component Value Date   TSH 2.72 03/20/2020   Lab Results  Component Value Date   WBC 12.8 (H) 06/30/2021   HGB 13.0 06/30/2021   HCT 37.5 06/30/2021   MCV 92.4 06/30/2021   PLT 267 06/30/2021   Lab Results  Component Value Date   NA 135 06/30/2021   K 3.8 06/30/2021   CO2 27 06/30/2021   GLUCOSE 142 (H) 06/30/2021   BUN 9 06/30/2021   CREATININE 0.57 06/30/2021   BILITOT 0.3 09/25/2020   ALKPHOS 111 09/25/2020   AST 20 09/25/2020   ALT 16 09/25/2020   PROT 6.8 09/25/2020   ALBUMIN 2.5 (L) 09/25/2020   CALCIUM 9.5 06/30/2021   ANIONGAP 5 06/30/2021   Lab Results  Component Value Date   CHOL 213 (H) 03/20/2020   Lab Results  Component Value Date   HDL 61 03/20/2020   Lab Results  Component Value Date   LDLCALC 103 (H) 03/20/2020   Lab Results  Component Value Date   TRIG 99 09/04/2020   Lab  Results  Component Value Date   CHOLHDL 3.5 03/20/2020   Lab Results  Component Value Date   HGBA1C 5.0 04/04/2020      Assessment & Plan:   Problem List Items Addressed This Visit       Cardiovascular and Mediastinum   Essential  hypertension, benign    BP Readings from Last 1 Encounters:  11/15/21 138/84  Well-controlled with amlodipine and nebivolol Counseled for compliance with the medications Advised DASH diet and moderate exercise/walking, at least 150 mins/week      Relevant Medications   amLODipine (NORVASC) 10 MG tablet   Other Relevant Orders   TSH   CMP14+EGFR   CBC with Differential/Platelet     Musculoskeletal and Integument   DDD (degenerative disc disease), lumbar    Followed by orthopedic surgery Takes gabapentin Robaxin as needed Avoid heavy lifting and frequent bending Undergoing PT        Other   HLD (hyperlipidemia)    Last lipid profile reviewed Advised to follow DASH diet for now      Relevant Medications   amLODipine (NORVASC) 10 MG tablet   Other Relevant Orders   Lipid panel   Status post total replacement of left hip    Left hip pain improved now Has chronic low back pain, takes gabapentin and is undergoing PT      Encounter for general adult medical examination with abnormal findings - Primary    Physical exam as documented. Fasting blood test today. Advised to get Shingrix vaccine at local pharmacy.      Relevant Orders   TSH   CMP14+EGFR   CBC with Differential/Platelet   Ventral hernia without obstruction or gangrene    History of abdominal surgeries Has ventral hernia Followed by general surgeon      Other Visit Diagnoses     Hyperglycemia       Relevant Orders   Hemoglobin A1c   CMP14+EGFR   Vitamin D deficiency       Relevant Orders   VITAMIN D 25 Hydroxy (Vit-D Deficiency, Fractures)   Need for pneumococcal 20-valent conjugate vaccination       Relevant Orders   Pneumococcal conjugate vaccine 20-valent (Prevnar 20) (Completed)   Need for immunization against influenza       Relevant Orders   Flu Vaccine QUAD High Dose(Fluad) (Completed)       Meds ordered this encounter  Medications   amLODipine (NORVASC) 10 MG tablet    Sig: Take  1 tablet (10 mg total) by mouth daily.    Dispense:  90 tablet    Refill:  1    Follow-up: Return in about 6 months (around 05/16/2022) for HTN.    [name], MD

## 2021-11-15 NOTE — Assessment & Plan Note (Signed)
Left hip pain improved now Has chronic low back pain, takes gabapentin and is undergoing PT

## 2021-11-15 NOTE — Assessment & Plan Note (Signed)
Last lipid profile reviewed Advised to follow DASH diet for now 

## 2021-11-15 NOTE — Assessment & Plan Note (Signed)
History of abdominal surgeries Has ventral hernia Followed by general surgeon

## 2021-11-16 ENCOUNTER — Other Ambulatory Visit: Payer: Self-pay | Admitting: Internal Medicine

## 2021-11-16 DIAGNOSIS — E782 Mixed hyperlipidemia: Secondary | ICD-10-CM

## 2021-11-16 LAB — CBC WITH DIFFERENTIAL/PLATELET
Basophils Absolute: 0.1 10*3/uL (ref 0.0–0.2)
Basos: 1 %
EOS (ABSOLUTE): 0.1 10*3/uL (ref 0.0–0.4)
Eos: 2 %
Hematocrit: 45.7 % (ref 34.0–46.6)
Hemoglobin: 15.6 g/dL (ref 11.1–15.9)
Immature Grans (Abs): 0.1 10*3/uL (ref 0.0–0.1)
Immature Granulocytes: 1 %
Lymphocytes Absolute: 1.1 10*3/uL (ref 0.7–3.1)
Lymphs: 12 %
MCH: 32.1 pg (ref 26.6–33.0)
MCHC: 34.1 g/dL (ref 31.5–35.7)
MCV: 94 fL (ref 79–97)
Monocytes Absolute: 1 10*3/uL — ABNORMAL HIGH (ref 0.1–0.9)
Monocytes: 11 %
Neutrophils Absolute: 6.9 10*3/uL (ref 1.4–7.0)
Neutrophils: 73 %
Platelets: 315 10*3/uL (ref 150–450)
RBC: 4.86 x10E6/uL (ref 3.77–5.28)
RDW: 12.1 % (ref 11.7–15.4)
WBC: 9.3 10*3/uL (ref 3.4–10.8)

## 2021-11-16 LAB — CMP14+EGFR
ALT: 24 IU/L (ref 0–32)
AST: 41 IU/L — ABNORMAL HIGH (ref 0–40)
Albumin/Globulin Ratio: 1.9 (ref 1.2–2.2)
Albumin: 4.9 g/dL (ref 3.9–4.9)
Alkaline Phosphatase: 103 IU/L (ref 44–121)
BUN/Creatinine Ratio: 13 (ref 12–28)
BUN: 11 mg/dL (ref 8–27)
Bilirubin Total: 0.6 mg/dL (ref 0.0–1.2)
CO2: 22 mmol/L (ref 20–29)
Calcium: 10.6 mg/dL — ABNORMAL HIGH (ref 8.7–10.3)
Chloride: 99 mmol/L (ref 96–106)
Creatinine, Ser: 0.83 mg/dL (ref 0.57–1.00)
Globulin, Total: 2.6 g/dL (ref 1.5–4.5)
Glucose: 106 mg/dL — ABNORMAL HIGH (ref 70–99)
Potassium: 4.5 mmol/L (ref 3.5–5.2)
Sodium: 137 mmol/L (ref 134–144)
Total Protein: 7.5 g/dL (ref 6.0–8.5)
eGFR: 76 mL/min/{1.73_m2} (ref >59)

## 2021-11-16 LAB — LIPID PANEL
Chol/HDL Ratio: 4.2 ratio (ref 0.0–4.4)
Cholesterol, Total: 312 mg/dL — ABNORMAL HIGH (ref 100–199)
HDL: 75 mg/dL (ref >39)
LDL Chol Calc (NIH): 183 mg/dL — ABNORMAL HIGH (ref 0–99)
Triglycerides: 282 mg/dL — ABNORMAL HIGH (ref 0–149)
VLDL Cholesterol Cal: 54 mg/dL — ABNORMAL HIGH (ref 5–40)

## 2021-11-16 LAB — HEMOGLOBIN A1C
Est. average glucose Bld gHb Est-mCnc: 111 mg/dL
Hgb A1c MFr Bld: 5.5 % (ref 4.8–5.6)

## 2021-11-16 LAB — TSH: TSH: 1.45 u[IU]/mL (ref 0.450–4.500)

## 2021-11-16 LAB — VITAMIN D 25 HYDROXY (VIT D DEFICIENCY, FRACTURES): Vit D, 25-Hydroxy: 43.4 ng/mL (ref 30.0–100.0)

## 2021-11-16 MED ORDER — ROSUVASTATIN CALCIUM 10 MG PO TABS: 10.0000 mg | ORAL_TABLET | Freq: Every day | ORAL | 1 refills | Status: DC

## 2021-12-03 ENCOUNTER — Other Ambulatory Visit: Payer: Self-pay | Admitting: Orthopedic Surgery

## 2021-12-03 MED ORDER — GABAPENTIN 100 MG PO CAPS: ORAL_CAPSULE | ORAL | 0 refills | Status: DC

## 2021-12-03 MED ORDER — TIZANIDINE HCL 4 MG PO TABS: 4.0000 mg | ORAL_TABLET | Freq: Three times a day (TID) | ORAL | 0 refills | Status: DC

## 2021-12-21 ENCOUNTER — Telehealth: Payer: Self-pay | Admitting: *Deleted

## 2021-12-21 NOTE — Telephone Encounter (Signed)
Left VM attempting to reach patient to check status of Left total hip.

## 2022-01-01 ENCOUNTER — Other Ambulatory Visit: Payer: Self-pay | Admitting: Orthopedic Surgery

## 2022-01-03 ENCOUNTER — Other Ambulatory Visit: Payer: Self-pay | Admitting: Orthopedic Surgery

## 2022-01-04 ENCOUNTER — Other Ambulatory Visit: Payer: Self-pay | Admitting: Orthopedic Surgery

## 2022-01-04 DIAGNOSIS — M5136 Other intervertebral disc degeneration, lumbar region: Secondary | ICD-10-CM

## 2022-01-04 MED ORDER — TIZANIDINE HCL 4 MG PO TABS: 4.0000 mg | ORAL_TABLET | Freq: Three times a day (TID) | ORAL | 5 refills | Status: DC

## 2022-01-04 NOTE — Progress Notes (Signed)
Encounter Diagnosis  Name Primary?   Degenerative disc disease, lumbar Yes    Meds ordered this encounter  Medications   tiZANidine (ZANAFLEX) 4 MG tablet    Sig: Take 1 tablet (4 mg total) by mouth 3 (three) times daily.    Dispense:  60 tablet    Refill:  5

## 2022-01-05 ENCOUNTER — Other Ambulatory Visit: Payer: Self-pay | Admitting: Internal Medicine

## 2022-01-05 DIAGNOSIS — I10 Essential (primary) hypertension: Secondary | ICD-10-CM

## 2022-01-08 ENCOUNTER — Other Ambulatory Visit: Payer: Self-pay | Admitting: Internal Medicine

## 2022-01-08 DIAGNOSIS — I10 Essential (primary) hypertension: Secondary | ICD-10-CM

## 2022-01-24 ENCOUNTER — Other Ambulatory Visit: Payer: Self-pay | Admitting: Orthopedic Surgery

## 2022-03-14 ENCOUNTER — Telehealth: Payer: Self-pay | Admitting: Internal Medicine

## 2022-03-14 ENCOUNTER — Encounter: Payer: Self-pay | Admitting: Radiology

## 2022-03-14 NOTE — Telephone Encounter (Signed)
Per patient

## 2022-03-15 NOTE — Telephone Encounter (Signed)
Noted in her chart  

## 2022-03-18 ENCOUNTER — Other Ambulatory Visit: Payer: Self-pay | Admitting: Orthopedic Surgery

## 2022-03-21 ENCOUNTER — Encounter: Payer: Self-pay | Admitting: Radiology

## 2022-03-21 ENCOUNTER — Other Ambulatory Visit: Payer: Self-pay | Admitting: Orthopedic Surgery

## 2022-03-22 ENCOUNTER — Other Ambulatory Visit: Payer: Self-pay | Admitting: Orthopedic Surgery

## 2022-03-24 ENCOUNTER — Other Ambulatory Visit: Payer: Self-pay | Admitting: Orthopedic Surgery

## 2022-04-02 ENCOUNTER — Other Ambulatory Visit: Payer: Self-pay | Admitting: Internal Medicine

## 2022-04-02 DIAGNOSIS — I10 Essential (primary) hypertension: Secondary | ICD-10-CM

## 2022-04-03 MED ORDER — NEBIVOLOL HCL 20 MG PO TABS: 1.0000 | ORAL_TABLET | Freq: Every day | ORAL | 0 refills | Status: DC

## 2022-05-07 ENCOUNTER — Other Ambulatory Visit: Payer: Self-pay | Admitting: Internal Medicine

## 2022-05-07 DIAGNOSIS — E782 Mixed hyperlipidemia: Secondary | ICD-10-CM

## 2022-05-08 ENCOUNTER — Other Ambulatory Visit: Payer: Self-pay | Admitting: Internal Medicine

## 2022-05-08 DIAGNOSIS — E782 Mixed hyperlipidemia: Secondary | ICD-10-CM

## 2022-05-20 ENCOUNTER — Ambulatory Visit: Payer: Medicare PPO | Admitting: Internal Medicine

## 2022-05-20 ENCOUNTER — Encounter: Payer: Self-pay | Admitting: Internal Medicine

## 2022-05-20 VITALS — BP 128/82 | HR 71 | Ht 65.0 in | Wt 130.8 lb

## 2022-05-20 DIAGNOSIS — M5136 Other intervertebral disc degeneration, lumbar region: Secondary | ICD-10-CM

## 2022-05-20 DIAGNOSIS — Z1239 Encounter for other screening for malignant neoplasm of breast: Secondary | ICD-10-CM

## 2022-05-20 DIAGNOSIS — D75839 Thrombocytosis, unspecified: Secondary | ICD-10-CM | POA: Diagnosis not present

## 2022-05-20 DIAGNOSIS — E782 Mixed hyperlipidemia: Secondary | ICD-10-CM

## 2022-05-20 DIAGNOSIS — I10 Essential (primary) hypertension: Secondary | ICD-10-CM

## 2022-05-20 MED ORDER — NEBIVOLOL HCL 20 MG PO TABS
1.0000 | ORAL_TABLET | Freq: Every day | ORAL | 1 refills | Status: DC
Start: 2022-05-20 — End: 2022-12-26

## 2022-05-20 MED ORDER — AMLODIPINE BESYLATE 10 MG PO TABS: 10.0000 mg | ORAL_TABLET | Freq: Every day | ORAL | 1 refills | Status: DC

## 2022-05-20 MED ORDER — ROSUVASTATIN CALCIUM 10 MG PO TABS: 10.0000 mg | ORAL_TABLET | Freq: Every day | ORAL | 1 refills | Status: DC

## 2022-05-20 NOTE — Assessment & Plan Note (Addendum)
She had acute phase reactant, improved

## 2022-05-20 NOTE — Assessment & Plan Note (Addendum)
Followed by orthopedic surgery Takes gabapentin Zanaflex as needed Avoid heavy lifting and frequent bending Completed PT

## 2022-05-20 NOTE — Assessment & Plan Note (Addendum)
Last CMP showed hypercalcemia Check CMP and PTH

## 2022-05-20 NOTE — Assessment & Plan Note (Signed)
BP Readings from Last 1 Encounters:  11/15/21 138/84   Well-controlled with amlodipine and nebivolol Counseled for compliance with the medications Advised DASH diet and moderate exercise/walking, at least 150 mins/week 

## 2022-05-20 NOTE — Progress Notes (Signed)
Established Patient Office Visit  Subjective:  Patient ID: Anne Aguirre, female    DOB: 10/28/1950  Age: 72 y.o. MRN: [id number]  CC:  Chief Complaint  Patient presents with   Hypertension    HPI Anne Aguirre is a 72 y.o. female with past medical history of HTN, diverticulitis s/p colon resection and hip arthritis who presents for f/u of her chronic medical conditions.  HTN: BP is well-controlled. Takes medications regularly. Patient denies headache, dizziness, chest pain, dyspnea or palpitations.   She had left [facility] in 06/23.  She denies any left hip pain currently.  She has chronic low back pain, for which she takes gabapentin and has completed PT. She takes Zanaflex PRN as well.  She has started taking Crestor for HLD as well.    Past Medical History:  Diagnosis Date   Acute diverticulitis 08/23/2020   Anemia    Colostomy in place [facility]) 09/21/2020   COVID-19 virus infection ---- +ve on 09/01/2020 09/10/2020   Essential hypertension, benign 10/01/2018   HLD (hyperlipidemia) 10/01/2018   Osteoarthritis 10/01/2018   Post-op intraabdominal/Liver abscess-Had Hartmann's with Colostomy on 08/29/2020 for Sigmoid Diverticulits 09/10/2020   s/p ex lap and Hartman's procedure with colostomy by AR 08/29/20 for diverticulitis with abscess-- Now with Liver/intrabdominal abscess---   Sepsis Due to Intraabdomonal Abscess 09/10/2020   Vitamin D deficiency disease 10/01/2018    Past Surgical History:  Procedure Laterality Date   COLON RESECTION N/A 08/29/2020   Procedure: COLON RESECTION;  Surgeon: Axel Filler, MD;  Location: [facility] OR;  Service: General;  Laterality: N/A;   COLOSTOMY N/A 08/29/2020   Procedure: COLOSTOMY;  Surgeon: Axel Filler, MD;  Location: [facility] OR;  Service: General;  Laterality: N/A;   LAPAROTOMY N/A 08/29/2020   Procedure: EXPLORATORY LAPAROTOMY;  Surgeon: Axel Filler, MD;  Location: [facility] OR;  Service: General;  Laterality: N/A;   LYSIS OF ADHESION N/A  04/25/2021   Procedure: LYSIS OF ADHESION;  Surgeon: Andria Meuse, MD;  Location: [facility] ORS;  Service: General;  Laterality: N/A;   TONSILLECTOMY Bilateral    TOTAL HIP ARTHROPLASTY Left 06/29/2021   Procedure: LEFT TOTAL HIP ARTHROPLASTY ANTERIOR APPROACH;  Surgeon: Kathryne Hitch, MD;  Location: [facility] ORS;  Service: Orthopedics;  Laterality: Left;   XI ROBOTIC ASSISTED COLOSTOMY TAKEDOWN N/A 04/25/2021   Procedure: XI ROBOTIC ASSISTED COLOSTOMY TAKEDOWN WITH LOW ANTERIOR RESECTION, WITH BILATERAL TAP BLOCK, FLEXIBLE SIGMOIDOSCOPY, AND INTRAOPERATIVE ASSESSMENT OF PERFUSION USING FIREFLY;  Surgeon: Andria Meuse, MD;  Location: [facility] ORS;  Service: General;  Laterality: N/A;    Family History  Problem Relation Age of Onset   Hypertension Mother    Stroke Mother    Stroke Father    Hypertension Brother    Hypertension Son     Social History   Socioeconomic History   Marital status: Married    Spouse name: [name]   Number of children: 2   Years of education: Not on file   Highest education level: Bachelor's degree (e.g., BA, AB, BS)  Occupational History   Not on file  Tobacco Use   Smoking status: Never   Smokeless tobacco: Never  Vaping Use   Vaping Use: Never used  Substance and Sexual Activity   Alcohol use: Yes    Alcohol/week: 7.0 standard drinks of alcohol    Types: 7 Glasses of wine per week    Comment: occasional wine   Drug use: Never   Sexual activity: Not on file  Other Topics Concern  Not on file  Social History Narrative   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   Social Determinants of Health   Financial Resource Strain: Low Risk  (11/27/2020)   Overall Financial Resource Strain (CARDIA)    Difficulty of Paying Living Expenses: Not hard at all  Food Insecurity: No Food Insecurity (05/19/2022)   Hunger Vital Sign    Worried About Running Out of Food in the Last Year: Never true    Ran Out of Food in the Last Year: Never true   Transportation Needs: No Transportation Needs (05/19/2022)   PRAPARE - Administrator, Civil Service (Medical): No    Lack of Transportation (Non-Medical): No  Physical Activity: Sufficiently Active (05/19/2022)   Exercise Vital Sign    Days of Exercise per Week: 5 days    Minutes of Exercise per Session: 30 min  Stress: Stress Concern Present (05/19/2022)   [employer] of Occupational Health - Occupational Stress Questionnaire    Feeling of Stress : To some extent  Social Connections: Unknown (05/19/2022)   Social Connection and Isolation Panel [NHANES]    Frequency of Communication with Friends and Family: More than three times a week    Frequency of Social Gatherings with Friends and Family: More than three times a week    Attends Religious Services: Never    Database administrator or Organizations: Not on file    Attends Banker Meetings: Not on file    Marital Status: Married  Intimate Partner Violence: Not At Risk (11/27/2020)   Humiliation, Afraid, Rape, and Kick questionnaire    Fear of Current or Ex-Partner: No    Emotionally Abused: No    Physically Abused: No    Sexually Abused: No    Outpatient Medications Prior to Visit  Medication Sig Dispense Refill   acetaminophen (TYLENOL) 500 MG tablet Take 1,000 mg by mouth in the morning and at bedtime.     Camphor-Menthol-Methyl Sal (SALONPAS) 3.01-20-08 % PTCH Place 1 patch onto the skin daily as needed (pain).     gabapentin (NEURONTIN) 100 MG capsule TAKE 1 CAPSULE BY MOUTH EVERY 8 HOURS AS NEEDED 90 capsule 5   Multiple Vitamin (MULTIVITAMIN WITH MINERALS) TABS tablet Take 1 tablet by mouth daily.     tiZANidine (ZANAFLEX) 4 MG tablet Take 1 tablet (4 mg total) by mouth 3 (three) times daily. 60 tablet 5   amLODipine (NORVASC) 10 MG tablet Take 1 tablet (10 mg total) by mouth daily. 90 tablet 1   aspirin 81 MG chewable tablet Chew 1 tablet (81 mg total) by mouth 2 (two) times daily. 35 tablet 0    methocarbamol (ROBAXIN) 500 MG tablet Take 1 tablet (500 mg total) by mouth every 6 (six) hours as needed for muscle spasms. 40 tablet 1   Nebivolol HCl 20 MG TABS Take 1 tablet (20 mg total) by mouth daily. 90 tablet 0   rosuvastatin (CRESTOR) 10 MG tablet Take 1 tablet by mouth once daily 90 tablet 0   No facility-administered medications prior to visit.    No Known Allergies  ROS Review of Systems  Constitutional:  Negative for chills and fever.  HENT:  Negative for congestion, sinus pressure, sinus pain and sore throat.   Eyes:  Negative for pain and discharge.  Respiratory:  Negative for cough and shortness of breath.   Cardiovascular:  Negative for chest pain and palpitations.  Gastrointestinal:  Negative for diarrhea, nausea and vomiting.  Endocrine: Negative  for polydipsia and polyuria.  Genitourinary:  Negative for dysuria and hematuria.  Musculoskeletal:  Positive for back pain. Negative for neck pain and neck stiffness.  Skin:  Negative for rash.  Neurological:  Negative for dizziness and weakness.  Psychiatric/Behavioral:  Negative for agitation and behavioral problems.       Objective:    Physical Exam Vitals reviewed.  Constitutional:      General: She is not in acute distress.    Appearance: She is not diaphoretic.  HENT:     Head: Normocephalic and atraumatic.     Nose: Nose normal.     Mouth/Throat:     Mouth: Mucous membranes are moist.  Eyes:     General: No scleral icterus.    Extraocular Movements: Extraocular movements intact.  Cardiovascular:     Rate and Rhythm: Normal rate and regular rhythm.     Pulses: Normal pulses.     Heart sounds: Normal heart sounds. No murmur heard. Pulmonary:     Breath sounds: Normal breath sounds. No wheezing or rales.  Abdominal:     Palpations: Abdomen is soft.     Tenderness: There is no abdominal tenderness.     Hernia: A hernia (Ventral) is present.     Comments: Incision site C/D/I  Musculoskeletal:      Cervical back: Neck supple. No tenderness.     Right lower leg: No edema.     Left lower leg: No edema.  Skin:    General: Skin is warm.     Findings: No rash.  Neurological:     General: No focal deficit present.     Mental Status: She is alert and oriented to person, place, and time.     Sensory: No sensory deficit.     Motor: No weakness.  Psychiatric:        Mood and Affect: Mood normal.        Behavior: Behavior normal.     BP 128/82 (BP Location: Right Arm)   Pulse 71   Ht 5\' 5"  (1.651 m)   Wt 130 lb 12.8 oz (59.3 kg)   SpO2 99%   BMI 21.77 kg/m  Wt Readings from Last 3 Encounters:  05/20/22 130 lb 12.8 oz (59.3 kg)  11/15/21 140 lb (63.5 kg)  06/29/21 128 lb (58.1 kg)    Lab Results  Component Value Date   TSH 1.450 11/15/2021   Lab Results  Component Value Date   WBC 9.3 11/15/2021   HGB 15.6 11/15/2021   HCT 45.7 11/15/2021   MCV 94 11/15/2021   PLT 315 11/15/2021   Lab Results  Component Value Date   NA 137 11/15/2021   K 4.5 11/15/2021   CO2 22 11/15/2021   GLUCOSE 106 (H) 11/15/2021   BUN 11 11/15/2021   CREATININE 0.83 11/15/2021   BILITOT 0.6 11/15/2021   ALKPHOS 103 11/15/2021   AST 41 (H) 11/15/2021   ALT 24 11/15/2021   PROT 7.5 11/15/2021   ALBUMIN 4.9 11/15/2021   CALCIUM 10.6 (H) 11/15/2021   ANIONGAP 5 06/30/2021   EGFR 76 11/15/2021   Lab Results  Component Value Date   CHOL 312 (H) 11/15/2021   Lab Results  Component Value Date   HDL 75 11/15/2021   Lab Results  Component Value Date   LDLCALC 183 (H) 11/15/2021   Lab Results  Component Value Date   TRIG 282 (H) 11/15/2021   Lab Results  Component Value Date   CHOLHDL 4.2 11/15/2021  Lab Results  Component Value Date   HGBA1C 5.5 11/15/2021      Assessment & Plan:   Problem List Items Addressed This Visit       Cardiovascular and Mediastinum   Essential hypertension, benign - Primary    BP Readings from Last 1 Encounters:  11/15/21 138/84   Well-controlled with amlodipine and nebivolol Counseled for compliance with the medications Advised DASH diet and moderate exercise/walking, at least 150 mins/week      Relevant Medications   amLODipine (NORVASC) 10 MG tablet   Nebivolol HCl 20 MG TABS   rosuvastatin (CRESTOR) 10 MG tablet   Other Relevant Orders   CMP14+EGFR     Musculoskeletal and Integument   DDD (degenerative disc disease), lumbar    Followed by orthopedic surgery Takes gabapentin Zanaflex as needed Avoid heavy lifting and frequent bending Completed PT        Hematopoietic and Hemostatic   Thrombocytosis    She had acute phase reactant, improved        Other   HLD (hyperlipidemia)    Last lipid profile reviewed On Crestor Advised to follow DASH diet for now      Relevant Medications   amLODipine (NORVASC) 10 MG tablet   Nebivolol HCl 20 MG TABS   rosuvastatin (CRESTOR) 10 MG tablet   Other Relevant Orders   CMP14+EGFR   Lipid Profile   Hypercalcemia    Last CMP showed hypercalcemia Check CMP and PTH      Relevant Orders   CMP14+EGFR   Parathyroid hormone, intact (no Ca)   Other Visit Diagnoses     Encounter for screening for malignant neoplasm of breast, unspecified screening modality       Relevant Orders   MM 3D SCREENING MAMMOGRAM BILATERAL BREAST       Meds ordered this encounter  Medications   amLODipine (NORVASC) 10 MG tablet    Sig: Take 1 tablet (10 mg total) by mouth daily.    Dispense:  90 tablet    Refill:  1   Nebivolol HCl 20 MG TABS    Sig: Take 1 tablet (20 mg total) by mouth daily.    Dispense:  90 tablet    Refill:  1   rosuvastatin (CRESTOR) 10 MG tablet    Sig: Take 1 tablet (10 mg total) by mouth daily.    Dispense:  90 tablet    Refill:  1    Follow-up: Return in about 6 months (around 11/20/2022) for Annual physical.    Anabel Halon, MD

## 2022-05-20 NOTE — Patient Instructions (Signed)
Please continue to take medications as prescribed.  Please continue to follow low salt diet and perform moderate exercise/walking at least 150 mins/week. 

## 2022-05-20 NOTE — Assessment & Plan Note (Addendum)
Last lipid profile reviewed On Crestor Advised to follow DASH diet for now

## 2022-05-21 LAB — CMP14+EGFR
ALT: 20 IU/L (ref 0–32)
AST: 30 IU/L (ref 0–40)
BUN/Creatinine Ratio: 12 (ref 12–28)
Bilirubin Total: 0.4 mg/dL (ref 0.0–1.2)
Chloride: 99 mmol/L (ref 96–106)
Glucose: 90 mg/dL (ref 70–99)
Potassium: 4.2 mmol/L (ref 3.5–5.2)
Sodium: 137 mmol/L (ref 134–144)

## 2022-05-21 LAB — LIPID PANEL: VLDL Cholesterol Cal: 44 mg/dL — ABNORMAL HIGH (ref 5–40)

## 2022-05-21 LAB — PARATHYROID HORMONE, INTACT (NO CA)

## 2022-05-22 LAB — CMP14+EGFR
Albumin/Globulin Ratio: 1.9 (ref 1.2–2.2)
Albumin: 5.1 g/dL — ABNORMAL HIGH (ref 3.9–4.9)
Alkaline Phosphatase: 73 IU/L (ref 44–121)
BUN: 11 mg/dL (ref 8–27)
CO2: 18 mmol/L — ABNORMAL LOW (ref 20–29)
Calcium: 10.4 mg/dL — ABNORMAL HIGH (ref 8.7–10.3)
Creatinine, Ser: 0.9 mg/dL (ref 0.57–1.00)
Globulin, Total: 2.7 g/dL (ref 1.5–4.5)
Total Protein: 7.8 g/dL (ref 6.0–8.5)
eGFR: 69 mL/min/{1.73_m2} (ref >59)

## 2022-05-22 LAB — LIPID PANEL
Chol/HDL Ratio: 4.2 ratio (ref 0.0–4.4)
Cholesterol, Total: 267 mg/dL — ABNORMAL HIGH (ref 100–199)
HDL: 63 mg/dL (ref >39)
LDL Chol Calc (NIH): 160 mg/dL — ABNORMAL HIGH (ref 0–99)
Triglycerides: 240 mg/dL — ABNORMAL HIGH (ref 0–149)

## 2022-07-17 ENCOUNTER — Other Ambulatory Visit: Payer: Self-pay | Admitting: Orthopedic Surgery

## 2022-07-17 DIAGNOSIS — M5136 Other intervertebral disc degeneration, lumbar region: Secondary | ICD-10-CM

## 2022-08-05 ENCOUNTER — Telehealth: Payer: Self-pay | Admitting: *Deleted

## 2022-08-05 NOTE — Telephone Encounter (Signed)
Attempted Ortho bundle 1 year call.

## 2022-08-15 ENCOUNTER — Other Ambulatory Visit: Payer: Self-pay | Admitting: Orthopedic Surgery

## 2022-08-15 DIAGNOSIS — M5136 Other intervertebral disc degeneration, lumbar region: Secondary | ICD-10-CM

## 2022-09-10 ENCOUNTER — Other Ambulatory Visit: Payer: Self-pay | Admitting: Orthopedic Surgery

## 2022-10-08 ENCOUNTER — Other Ambulatory Visit: Payer: Self-pay | Admitting: Orthopedic Surgery

## 2022-11-05 ENCOUNTER — Other Ambulatory Visit: Payer: Self-pay | Admitting: Orthopedic Surgery

## 2022-11-12 ENCOUNTER — Other Ambulatory Visit: Payer: Self-pay | Admitting: Orthopedic Surgery

## 2022-11-12 DIAGNOSIS — M51369 Other intervertebral disc degeneration, lumbar region without mention of lumbar back pain or lower extremity pain: Secondary | ICD-10-CM

## 2022-11-20 ENCOUNTER — Encounter: Payer: Medicare PPO | Admitting: Internal Medicine

## 2022-12-02 ENCOUNTER — Other Ambulatory Visit: Payer: Self-pay | Admitting: Orthopedic Surgery

## 2022-12-13 ENCOUNTER — Other Ambulatory Visit: Payer: Self-pay | Admitting: Orthopedic Surgery

## 2022-12-13 DIAGNOSIS — M51369 Other intervertebral disc degeneration, lumbar region without mention of lumbar back pain or lower extremity pain: Secondary | ICD-10-CM

## 2022-12-18 ENCOUNTER — Ambulatory Visit (INDEPENDENT_AMBULATORY_CARE_PROVIDER_SITE_OTHER): Payer: Medicare PPO | Admitting: Internal Medicine

## 2022-12-18 ENCOUNTER — Encounter: Payer: Self-pay | Admitting: Internal Medicine

## 2022-12-18 VITALS — BP 136/68 | HR 76 | Ht 65.0 in | Wt 134.2 lb

## 2022-12-18 DIAGNOSIS — Z23 Encounter for immunization: Secondary | ICD-10-CM

## 2022-12-18 DIAGNOSIS — I10 Essential (primary) hypertension: Secondary | ICD-10-CM | POA: Diagnosis not present

## 2022-12-18 DIAGNOSIS — Z13820 Encounter for screening for osteoporosis: Secondary | ICD-10-CM

## 2022-12-18 DIAGNOSIS — F4322 Adjustment disorder with anxiety: Secondary | ICD-10-CM | POA: Diagnosis not present

## 2022-12-18 DIAGNOSIS — R739 Hyperglycemia, unspecified: Secondary | ICD-10-CM | POA: Diagnosis not present

## 2022-12-18 DIAGNOSIS — Z0001 Encounter for general adult medical examination with abnormal findings: Secondary | ICD-10-CM | POA: Diagnosis not present

## 2022-12-18 DIAGNOSIS — E782 Mixed hyperlipidemia: Secondary | ICD-10-CM

## 2022-12-18 DIAGNOSIS — E559 Vitamin D deficiency, unspecified: Secondary | ICD-10-CM

## 2022-12-18 NOTE — Progress Notes (Unsigned)
Established Patient Office Visit  Subjective:  Patient ID: Anne Aguirre, female    DOB: July 19, 1950  Age: 72 y.o. MRN: [id number]  CC:  Chief Complaint  Patient presents with   Annual Exam   Anxiety    Stressed the last couple of weeks    HPI Anne Aguirre is a 72 y.o. female with past medical history of HTN, diverticulitis s/p colon resection and hip arthritis who presents for annual physical.  HTN: BP is well-controlled. Takes medications regularly. Patient denies headache, dizziness, chest pain, dyspnea or palpitations.  She had left [facility] in 06/23.  She denies any left hip pain currently.  She has chronic low back pain, for which she takes gabapentin and has completed PT. She takes Zanaflex PRN as well.   She has been taking Crestor for HLD as well. Her LDL was still elevated in the last visit.  She has been stressed for the last 2 weeks as her Grandson had to be placed in NICU and she has been going to [employer] to see her son. She is also stressed about recent election results. But she feels that she is emotionally drained currently and she will feel better soon.  Denies anhedonia, SI or HI currently.  She received flu vaccine in the office today.  Past Medical History:  Diagnosis Date   Acute diverticulitis 08/23/2020   Anemia    Colostomy in place [facility]) 09/21/2020   COVID-19 virus infection ---- +ve on 09/01/2020 09/10/2020   Essential hypertension, benign 10/01/2018   HLD (hyperlipidemia) 10/01/2018   Osteoarthritis 10/01/2018   Post-op intraabdominal/Liver abscess-Had Hartmann's with Colostomy on 08/29/2020 for Sigmoid Diverticulits 09/10/2020   s/p ex lap and Hartman's procedure with colostomy by AR 08/29/20 for diverticulitis with abscess-- Now with Liver/intrabdominal abscess---   Sepsis Due to Intraabdomonal Abscess 09/10/2020   Vitamin D deficiency disease 10/01/2018    Past Surgical History:  Procedure Laterality Date   COLON RESECTION N/A 08/29/2020   Procedure:  COLON RESECTION;  Surgeon: Axel Filler, MD;  Location: [facility] OR;  Service: General;  Laterality: N/A;   COLOSTOMY N/A 08/29/2020   Procedure: COLOSTOMY;  Surgeon: Axel Filler, MD;  Location: [facility] OR;  Service: General;  Laterality: N/A;   LAPAROTOMY N/A 08/29/2020   Procedure: EXPLORATORY LAPAROTOMY;  Surgeon: Axel Filler, MD;  Location: [facility] OR;  Service: General;  Laterality: N/A;   LYSIS OF ADHESION N/A 04/25/2021   Procedure: LYSIS OF ADHESION;  Surgeon: Andria Meuse, MD;  Location: [facility] ORS;  Service: General;  Laterality: N/A;   TONSILLECTOMY Bilateral    TOTAL HIP ARTHROPLASTY Left 06/29/2021   Procedure: LEFT TOTAL HIP ARTHROPLASTY ANTERIOR APPROACH;  Surgeon: Kathryne Hitch, MD;  Location: [facility] ORS;  Service: Orthopedics;  Laterality: Left;   XI ROBOTIC ASSISTED COLOSTOMY TAKEDOWN N/A 04/25/2021   Procedure: XI ROBOTIC ASSISTED COLOSTOMY TAKEDOWN WITH LOW ANTERIOR RESECTION, WITH BILATERAL TAP BLOCK, FLEXIBLE SIGMOIDOSCOPY, AND INTRAOPERATIVE ASSESSMENT OF PERFUSION USING FIREFLY;  Surgeon: Andria Meuse, MD;  Location: [facility] ORS;  Service: General;  Laterality: N/A;    Family History  Problem Relation Age of Onset   Hypertension Mother    Stroke Mother    Stroke Father    Hypertension Brother    Hypertension Son     Social History   Socioeconomic History   Marital status: Married    Spouse name: [name]   Number of children: 2   Years of education: Not on file   Highest education level: Bachelor's degree (e.g., BA,  AB, BS)  Occupational History   Not on file  Tobacco Use   Smoking status: Never   Smokeless tobacco: Never  Vaping Use   Vaping status: Never Used  Substance and Sexual Activity   Alcohol use: Yes    Alcohol/week: 7.0 standard drinks of alcohol    Types: 7 Glasses of wine per week    Comment: occasional wine   Drug use: Never   Sexual activity: Not on file  Other Topics Concern   Not on file  Social History Narrative   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   Social Determinants of Health   Financial Resource Strain: Low Risk  (12/14/2022)   Overall Financial Resource Strain (CARDIA)    Difficulty of Paying Living Expenses: Not hard at all  Food Insecurity: No Food Insecurity (12/14/2022)   Hunger Vital Sign    Worried About Running Out of Food in the Last Year: Never true    Ran Out of Food in the Last Year: Never true  Transportation Needs: No Transportation Needs (12/14/2022)   PRAPARE - Administrator, Civil Service (Medical): No    Lack of Transportation (Non-Medical): No  Physical Activity: Sufficiently Active (12/14/2022)   Exercise Vital Sign    Days of Exercise per Week: 5 days    Minutes of Exercise per Session: 30 min  Stress: Stress Concern Present (12/14/2022)   [employer] of Occupational Health - Occupational Stress Questionnaire    Feeling of Stress : Very much  Social Connections: Moderately Integrated (12/14/2022)   Social Connection and Isolation Panel [NHANES]    Frequency of Communication with Friends and Family: More than three times a week    Frequency of Social Gatherings with Friends and Family: More than three times a week    Attends Religious Services: Never    Database administrator or Organizations: Yes    Attends Banker Meetings: 1 to 4 times per year    Marital Status: Married  Catering manager Violence: Not At Risk (11/27/2020)   Humiliation, Afraid, Rape, and Kick questionnaire    Fear of Current or Ex-Partner: No    Emotionally Abused: No    Physically Abused: No    Sexually Abused: No    Outpatient Medications Prior to Visit  Medication Sig Dispense Refill   acetaminophen (TYLENOL) 500 MG tablet Take 1,000 mg by mouth in the morning and at bedtime.     amLODipine (NORVASC) 10 MG tablet Take 1 tablet (10 mg total) by mouth daily. 90 tablet 1   Camphor-Menthol-Methyl Sal (SALONPAS) 3.01-20-08 % PTCH Place 1 patch onto  the skin daily as needed (pain).     gabapentin (NEURONTIN) 100 MG capsule TAKE 1 CAPSULE BY MOUTH EVERY 8 HOURS AS NEEDED 90 capsule 0   Multiple Vitamin (MULTIVITAMIN WITH MINERALS) TABS tablet Take 1 tablet by mouth daily.     Nebivolol HCl 20 MG TABS Take 1 tablet (20 mg total) by mouth daily. 90 tablet 1   rosuvastatin (CRESTOR) 10 MG tablet Take 1 tablet (10 mg total) by mouth daily. 90 tablet 1   tiZANidine (ZANAFLEX) 4 MG tablet TAKE 1 TABLET BY MOUTH THREE TIMES DAILY 60 tablet 0   No facility-administered medications prior to visit.    No Known Allergies  ROS Review of Systems  Constitutional:  Negative for chills and fever.  HENT:  Negative for congestion, sinus pressure, sinus pain and sore throat.   Eyes:  Negative for  pain and discharge.  Respiratory:  Negative for cough and shortness of breath.   Cardiovascular:  Negative for chest pain and palpitations.  Gastrointestinal:  Negative for diarrhea, nausea and vomiting.  Endocrine: Negative for polydipsia and polyuria.  Genitourinary:  Negative for dysuria and hematuria.  Musculoskeletal:  Positive for back pain. Negative for neck pain and neck stiffness.  Skin:  Negative for rash.  Neurological:  Negative for dizziness and weakness.  Psychiatric/Behavioral:  Negative for agitation and behavioral problems. The patient is nervous/anxious.       Objective:    Physical Exam Vitals reviewed.  Constitutional:      General: She is not in acute distress.    Appearance: She is not diaphoretic.  HENT:     Head: Normocephalic and atraumatic.     Nose: Nose normal.     Mouth/Throat:     Mouth: Mucous membranes are moist.  Eyes:     General: No scleral icterus.    Extraocular Movements: Extraocular movements intact.  Cardiovascular:     Rate and Rhythm: Normal rate and regular rhythm.     Pulses: Normal pulses.     Heart sounds: Normal heart sounds. No murmur heard. Pulmonary:     Breath sounds: Normal breath sounds.  No wheezing or rales.  Abdominal:     Palpations: Abdomen is soft.     Tenderness: There is no abdominal tenderness.     Hernia: A hernia (Ventral) is present.     Comments: Incision site C/D/I  Musculoskeletal:     Cervical back: Neck supple. No tenderness.     Right lower leg: No edema.     Left lower leg: No edema.  Skin:    General: Skin is warm.     Findings: No rash.  Neurological:     General: No focal deficit present.     Mental Status: She is alert and oriented to person, place, and time.     Sensory: No sensory deficit.     Motor: No weakness.  Psychiatric:        Mood and Affect: Mood normal.        Behavior: Behavior normal.     BP 136/68 (BP Location: Left Arm)   Pulse 76   Ht 5\' 5"  (1.651 m)   Wt 134 lb 3.2 oz (60.9 kg)   SpO2 98%   BMI 22.33 kg/m  Wt Readings from Last 3 Encounters:  12/18/22 134 lb 3.2 oz (60.9 kg)  05/20/22 130 lb 12.8 oz (59.3 kg)  11/15/21 140 lb (63.5 kg)    Lab Results  Component Value Date   TSH 1.450 11/15/2021   Lab Results  Component Value Date   WBC 9.3 11/15/2021   HGB 15.6 11/15/2021   HCT 45.7 11/15/2021   MCV 94 11/15/2021   PLT 315 11/15/2021   Lab Results  Component Value Date   NA 137 05/20/2022   K 4.2 05/20/2022   CO2 18 (L) 05/20/2022   GLUCOSE 90 05/20/2022   BUN 11 05/20/2022   CREATININE 0.90 05/20/2022   BILITOT 0.4 05/20/2022   ALKPHOS 73 05/20/2022   AST 30 05/20/2022   ALT 20 05/20/2022   PROT 7.8 05/20/2022   ALBUMIN 5.1 (H) 05/20/2022   CALCIUM 10.4 (H) 05/20/2022   ANIONGAP 5 06/30/2021   EGFR 69 05/20/2022   Lab Results  Component Value Date   CHOL 267 (H) 05/20/2022   Lab Results  Component Value Date   HDL 63 05/20/2022  Lab Results  Component Value Date   LDLCALC 160 (H) 05/20/2022   Lab Results  Component Value Date   TRIG 240 (H) 05/20/2022   Lab Results  Component Value Date   CHOLHDL 4.2 05/20/2022   Lab Results  Component Value Date   HGBA1C 5.5 11/15/2021       Assessment & Plan:   Problem List Items Addressed This Visit       Cardiovascular and Mediastinum   Essential hypertension, benign    BP Readings from Last 1 Encounters:  12/18/22 136/68   Well-controlled with amlodipine and nebivolol Counseled for compliance with the medications Advised DASH diet and moderate exercise/walking, at least 150 mins/week      Relevant Orders   TSH   CMP14+EGFR   CBC with Differential/Platelet     Other   HLD (hyperlipidemia)    Last lipid profile reviewed On Crestor - if persistently elevated LDL, will increase dose of Crestor Advised to follow DASH diet for now      Relevant Orders   Lipid panel   Encounter for general adult medical examination with abnormal findings - Primary    Physical exam as documented. Fasting blood test today. Flu vaccine today. Ordered mammography and DEXA scan. Advised to get Shingrix vaccine at local pharmacy.      Adjustment disorder with anxious mood    Due to recent stressors She is fatigued from traveling frequently to [place] as well If persistent symptoms, will refer to [facility] therapy      Other Visit Diagnoses     Osteoporosis screening       Relevant Orders   DG Bone Density   Vitamin D deficiency       Relevant Orders   VITAMIN D 25 Hydroxy (Vit-D Deficiency, Fractures)   Hyperglycemia       Relevant Orders   Hemoglobin A1c       No orders of the defined types were placed in this encounter.   Follow-up: Return in about 6 months (around 06/18/2023).    Anabel Halon, MD

## 2022-12-18 NOTE — Assessment & Plan Note (Signed)
Due to recent stressors She is fatigued from traveling frequently to [facility] as well If persistent symptoms, will refer to [facility] therapy

## 2022-12-18 NOTE — Assessment & Plan Note (Addendum)
BP Readings from Last 1 Encounters:  12/18/22 136/68   Well-controlled with amlodipine and nebivolol Counseled for compliance with the medications Advised DASH diet and moderate exercise/walking, at least 150 mins/week

## 2022-12-18 NOTE — Assessment & Plan Note (Addendum)
Last lipid profile reviewed On Crestor - if persistently elevated LDL, will increase dose of Crestor Advised to follow DASH diet for now

## 2022-12-18 NOTE — Assessment & Plan Note (Signed)
Physical exam as documented. Fasting blood test today. Flu vaccine today. Ordered mammography and DEXA scan. Advised to get Shingrix vaccine at local pharmacy.

## 2022-12-18 NOTE — Patient Instructions (Addendum)
Schedule your Medicare Annual Wellness Visit at checkout.  Please continue to take medications as prescribed.  Please continue to follow low carb diet and perform moderate exercise/walking at least 150 mins/week.  Please consider getting Shingrix vaccine at local pharmacy.

## 2022-12-19 DIAGNOSIS — E782 Mixed hyperlipidemia: Secondary | ICD-10-CM | POA: Diagnosis not present

## 2022-12-19 DIAGNOSIS — I10 Essential (primary) hypertension: Secondary | ICD-10-CM | POA: Diagnosis not present

## 2022-12-19 DIAGNOSIS — R739 Hyperglycemia, unspecified: Secondary | ICD-10-CM | POA: Diagnosis not present

## 2022-12-19 DIAGNOSIS — E559 Vitamin D deficiency, unspecified: Secondary | ICD-10-CM | POA: Diagnosis not present

## 2022-12-20 LAB — LIPID PANEL
Chol/HDL Ratio: 2.8 {ratio} (ref 0.0–4.4)
Cholesterol, Total: 203 mg/dL — ABNORMAL HIGH (ref 100–199)
HDL: 73 mg/dL (ref >39)
LDL Chol Calc (NIH): 112 mg/dL — ABNORMAL HIGH (ref 0–99)
Triglycerides: 102 mg/dL (ref 0–149)
VLDL Cholesterol Cal: 18 mg/dL (ref 5–40)

## 2022-12-20 LAB — HEMOGLOBIN A1C
Est. average glucose Bld gHb Est-mCnc: 111 mg/dL
Hgb A1c MFr Bld: 5.5 % (ref 4.8–5.6)

## 2022-12-20 LAB — CMP14+EGFR
ALT: 21 [IU]/L (ref 0–32)
AST: 29 [IU]/L (ref 0–40)
Albumin: 5 g/dL — ABNORMAL HIGH (ref 3.9–4.9)
Alkaline Phosphatase: 87 [IU]/L (ref 44–121)
BUN/Creatinine Ratio: 15 (ref 12–28)
BUN: 12 mg/dL (ref 8–27)
Bilirubin Total: 0.8 mg/dL (ref 0.0–1.2)
CO2: 19 mmol/L — ABNORMAL LOW (ref 20–29)
Calcium: 10.5 mg/dL — ABNORMAL HIGH (ref 8.7–10.3)
Chloride: 100 mmol/L (ref 96–106)
Creatinine, Ser: 0.81 mg/dL (ref 0.57–1.00)
Globulin, Total: 2.7 g/dL (ref 1.5–4.5)
Glucose: 99 mg/dL (ref 70–99)
Potassium: 4.2 mmol/L (ref 3.5–5.2)
Sodium: 138 mmol/L (ref 134–144)
Total Protein: 7.7 g/dL (ref 6.0–8.5)
eGFR: 78 mL/min/{1.73_m2} (ref >59)

## 2022-12-20 LAB — CBC WITH DIFFERENTIAL/PLATELET
Basophils Absolute: 0.1 10*3/uL (ref 0.0–0.2)
Basos: 1 %
EOS (ABSOLUTE): 0.2 10*3/uL (ref 0.0–0.4)
Eos: 2 %
Hematocrit: 46.4 % (ref 34.0–46.6)
Hemoglobin: 15.2 g/dL (ref 11.1–15.9)
Immature Grans (Abs): 0 10*3/uL (ref 0.0–0.1)
Immature Granulocytes: 0 %
Lymphocytes Absolute: 1.9 10*3/uL (ref 0.7–3.1)
Lymphs: 21 %
MCH: 30.5 pg (ref 26.6–33.0)
MCHC: 32.8 g/dL (ref 31.5–35.7)
MCV: 93 fL (ref 79–97)
Monocytes Absolute: 1.3 10*3/uL — ABNORMAL HIGH (ref 0.1–0.9)
Monocytes: 14 %
Neutrophils Absolute: 5.6 10*3/uL (ref 1.4–7.0)
Neutrophils: 62 %
Platelets: 326 10*3/uL (ref 150–450)
RBC: 4.98 x10E6/uL (ref 3.77–5.28)
RDW: 11.5 % — ABNORMAL LOW (ref 11.7–15.4)
WBC: 9 10*3/uL (ref 3.4–10.8)

## 2022-12-20 LAB — VITAMIN D 25 HYDROXY (VIT D DEFICIENCY, FRACTURES): Vit D, 25-Hydroxy: 48.9 ng/mL (ref 30.0–100.0)

## 2022-12-20 LAB — TSH: TSH: 1.43 u[IU]/mL (ref 0.450–4.500)

## 2022-12-26 ENCOUNTER — Other Ambulatory Visit: Payer: Self-pay | Admitting: Internal Medicine

## 2022-12-26 DIAGNOSIS — I10 Essential (primary) hypertension: Secondary | ICD-10-CM

## 2022-12-28 ENCOUNTER — Other Ambulatory Visit: Payer: Self-pay | Admitting: Internal Medicine

## 2022-12-28 ENCOUNTER — Other Ambulatory Visit: Payer: Self-pay | Admitting: Orthopedic Surgery

## 2022-12-28 DIAGNOSIS — I10 Essential (primary) hypertension: Secondary | ICD-10-CM

## 2022-12-30 MED ORDER — NEBIVOLOL HCL 20 MG PO TABS: 1.0000 | ORAL_TABLET | Freq: Every day | ORAL | 0 refills | Status: DC

## 2022-12-30 MED ORDER — GABAPENTIN 100 MG PO CAPS: ORAL_CAPSULE | ORAL | 0 refills | Status: DC

## 2023-01-14 ENCOUNTER — Other Ambulatory Visit: Payer: Self-pay | Admitting: Orthopedic Surgery

## 2023-01-14 DIAGNOSIS — M51369 Other intervertebral disc degeneration, lumbar region without mention of lumbar back pain or lower extremity pain: Secondary | ICD-10-CM

## 2023-01-15 MED ORDER — TIZANIDINE HCL 4 MG PO TABS: 4.0000 mg | ORAL_TABLET | Freq: Three times a day (TID) | ORAL | 0 refills | Status: DC

## 2023-01-16 ENCOUNTER — Ambulatory Visit: Payer: Self-pay | Admitting: Internal Medicine

## 2023-01-16 NOTE — Telephone Encounter (Signed)
 Copied from CRM [id number]. Topic: Clinical - Pink Word Triage >> Jan 16, 2023  9:44 AM Antonio DEL wrote: Reason for Triage: Patient is experiencing pink word diarrhea and is losing weight. Says she is having a diverticulitis flare up but not having any vomiting or fever, just the diarrhea. This has been going on for the past 4 days and patient has been on clear liquid diet. Patient would like to know what should she/ can she do since she is unable to get in and see doctor sooner.   Chief Complaint: Diarrhea Symptoms: Diarrhea and weakness Frequency: 4 days Pertinent Negatives: Patient denies relief Disposition: [] ED /[] Urgent Care (no appt availability in office) / [x] Appointment(In office/virtual)/ []  [place] [facility]/ [] Home Care/ [] Refused Recommended Disposition /[] [place] [facility]/ []  Follow-up with PCP Additional Notes: Patient called in complaining of ongoing diarrhea for 4 days. Patient stated that she had surgery for diverticulitis in 2022 and this could be a flare-up. She is following a clear diet protocol and denies relief. Patient stated that she is going to the bathroom 4-5 times a day. Patient describes stools as watery and mucousy. Patient denies fever, vomiting, and feeling sick. Patient is able to urinate and states that she has been drinking plenty of fluids to stay hydrated. Patient denies abdominal pain, but states that she has abdominal cramping when using bathroom. Patient describes weakness related to clear liquid diet. Advised patient to see provider within 24 hours. No availability at patient's current PCP office. Scheduled patient at [facility] for tomorrow morning. Advised patient to continue to follow the clear liquid diet protocol and to call back if symptoms worsen of if she has signs of severe dehydration. Patient complied.   Reason for Disposition  [1] MODERATE diarrhea (e.g., 4-6 times / day more than normal) AND [2] present > 48 hours (2  days)  Answer Assessment - Initial Assessment Questions 1. DIARRHEA SEVERITY: How bad is the diarrhea? How many more stools have you had in the past 24 hours than normal?    - NO DIARRHEA (SCALE 0)   - MILD (SCALE 1-3): Few loose or mushy BMs; increase of 1-3 stools over normal daily number of stools; mild increase in ostomy output.   -  MODERATE (SCALE 4-7): Increase of 4-6 stools daily over normal; moderate increase in ostomy output.   -  SEVERE (SCALE 8-10; OR WORST POSSIBLE): Increase of 7 or more stools daily over normal; moderate increase in ostomy output; incontinence.      Moderate, 4-5 times, pure liquid  2. ONSET: When did the diarrhea begin?      Sunday  3. BM CONSISTENCY: How loose or watery is the diarrhea?      All stools liquid and mucousy  4. VOMITING: Are you also vomiting? If Yes, ask: How many times in the past 24 hours?      Denies  5. ABDOMEN PAIN: Are you having any abdomen pain? If Yes, ask: What does it feel like? (e.g., crampy, dull, intermittent, constant)      Denies, but feels bloating and cramps when using bathroom  6. ABDOMEN PAIN SEVERITY: If present, ask: How bad is the pain?  (e.g., Scale 1-10; mild, moderate, or severe)   - MILD (1-3): doesn't interfere with normal activities, abdomen soft and not tender to touch    - MODERATE (4-7): interferes with normal activities or awakens from sleep, abdomen tender to touch    - SEVERE (8-10): excruciating pain, doubled over,  unable to do any normal activities       5  7. ORAL INTAKE: If vomiting, Have you been able to drink liquids? How much liquids have you had in the past 24 hours?     Clear liquid- Ensure drinks, tea, broth and jello  8. HYDRATION: Any signs of dehydration? (e.g., dry mouth [not just dry lips], too weak to stand, dizziness, new weight loss) When did you last urinate?     Lost 4lbs, patient states that she feels weak due to clear liquid diet  10. ANTIBIOTIC USE:  Are you taking antibiotics now or have you taken antibiotics in the past 2 months?      Patient stated she has taken nothing new recently  11. OTHER SYMPTOMS: Do you have any other symptoms? (e.g., fever, blood in stool)       Denies fever and feeling sick, denies blood in stool  Protocols used: [facility]

## 2023-01-17 ENCOUNTER — Encounter: Payer: Self-pay | Admitting: Physician Assistant

## 2023-01-17 ENCOUNTER — Ambulatory Visit (INDEPENDENT_AMBULATORY_CARE_PROVIDER_SITE_OTHER): Payer: Medicare PPO | Admitting: Physician Assistant

## 2023-01-17 VITALS — BP 132/85 | Temp 97.1°F | Wt 128.4 lb

## 2023-01-17 DIAGNOSIS — K5732 Diverticulitis of large intestine without perforation or abscess without bleeding: Secondary | ICD-10-CM

## 2023-01-17 DIAGNOSIS — R197 Diarrhea, unspecified: Secondary | ICD-10-CM | POA: Diagnosis not present

## 2023-01-17 MED ORDER — METRONIDAZOLE 500 MG PO TABS: 500.0000 mg | ORAL_TABLET | Freq: Two times a day (BID) | ORAL | 0 refills | Status: AC

## 2023-01-17 MED ORDER — CIPROFLOXACIN HCL 500 MG PO TABS: 500.0000 mg | ORAL_TABLET | Freq: Two times a day (BID) | ORAL | 0 refills | Status: DC

## 2023-01-17 NOTE — Progress Notes (Signed)
 Acute Office Visit  Subjective:     Patient ID: Anne Aguirre, female    DOB: [birth year], 73 y.o.   MRN: [id number]   HPI  Patient is in today for 6 days of diarrhea.  Patient with a past medical history significant for diverticulitis with surgical interventions in 2022.  Patient reports today she has had diarrhea since Saturday.  She states in the last 5 days she has put herself on a liquid diet, but continues to have 4-5 episodes of diarrhea a day.  She reports she has not been able to eat without experiencing diarrhea.  Patient admits to minimal abdominal pain with gas and bloating.  Patient denies fever or blood in her stool.  Review of Systems  Constitutional:  Negative for fever and malaise/fatigue.  Respiratory: Negative.    Cardiovascular: Negative.   Gastrointestinal:  Positive for abdominal pain and diarrhea. Negative for blood in stool, nausea and vomiting.  Genitourinary:  Negative for dysuria.  Neurological:  Negative for dizziness and headaches.       Objective:     BP 132/85   Temp (!) 97.1 F (36.2 C) (Oral)   Wt 128 lb 6.4 oz (58.2 kg)   BMI 21.37 kg/m   Physical Exam Constitutional:      Appearance: Normal appearance. She is normal weight.  HENT:     Head: Normocephalic.     Nose: Nose normal.     Mouth/Throat:     Mouth: Mucous membranes are moist.     Pharynx: Oropharynx is clear.  Eyes:     Extraocular Movements: Extraocular movements intact.     Conjunctiva/sclera: Conjunctivae normal.  Neck:     Thyroid : No thyroid  mass, thyromegaly or thyroid  tenderness.  Cardiovascular:     Rate and Rhythm: Normal rate and regular rhythm.     Heart sounds: No murmur heard.    No gallop.  Pulmonary:     Effort: Pulmonary effort is normal.     Breath sounds: Normal breath sounds. No wheezing, rhonchi or rales.  Abdominal:     General: Abdomen is flat. Bowel sounds are normal.     Palpations: Abdomen is soft.     Tenderness: There is no abdominal  tenderness.  Skin:    General: Skin is warm and dry.     Capillary Refill: Capillary refill takes less than 2 seconds.  Neurological:     General: No focal deficit present.     Mental Status: She is alert and oriented to person, place, and time.  Psychiatric:        Mood and Affect: Mood normal.        Behavior: Behavior normal.     No results found for any visits on 01/17/23.      Assessment & Plan:  Diverticulitis of colon -     metroNIDAZOLE ; Take 1 tablet (500 mg total) by mouth 2 (two) times daily for 7 days.  Dispense: 14 tablet; Refill: 0 -     Ciprofloxacin  HCl; Take 1 tablet (500 mg total) by mouth 2 (two) times daily.  Dispense: 10 tablet; Refill: 0  Frequent diarrhea   Patient appears stable today.  Physical exam overall without abnormal findings.  Due to patient's significant history with diverticulitis will treat as such.  Patient counseled on potential side effects from medication such as nausea, vomiting, and increased diarrhea.  Patient advised to monitor for worsening symptoms such as blood in her stool, fever, non-intractable pain, and worsening of symptoms on  antibiotic treatment.  Patient is to proceed to ER if she experiences the symptoms.  Patient encouraged to continue with increased fluid intake for hydration.  Patient advised to gradually return to normal diet as tolerated.  Patient to follow-up with Dr. Tobie as needed.  Patient agreeable to plan.  Return if symptoms worsen or fail to improve.  Charmaine Adlynn Lowenstein, PA-C

## 2023-01-22 ENCOUNTER — Ambulatory Visit (HOSPITAL_COMMUNITY): Payer: Medicare PPO

## 2023-01-22 ENCOUNTER — Other Ambulatory Visit (HOSPITAL_COMMUNITY): Payer: Medicare PPO

## 2023-01-28 ENCOUNTER — Other Ambulatory Visit: Payer: Self-pay | Admitting: Orthopedic Surgery

## 2023-02-10 ENCOUNTER — Ambulatory Visit: Payer: Self-pay | Admitting: Internal Medicine

## 2023-02-10 NOTE — Telephone Encounter (Signed)
Chief Complaint: Diarrhea  Symptoms: Diarrhea, abdominal pain, abdominal bloating, weight loss. Frequency: Intermittent Pertinent Negatives: Patient denies Fever, n/v, dehydration, blood in stool Disposition: [] ED /[] Urgent Care (no appt availability in office) / [x] Appointment(In office/virtual)/ []  [place] [facility]/ [] Home Care/ [] Refused Recommended Disposition /[] [place] [facility]/ []  Follow-up with PCP Additional Notes: Patient called with complaints diarrhea, abdominal bloating, abdominal pain, and weight loss. Patient previously seen for diverticulitis flare up on 01/17/23, and was treated with antibiotics however symptoms persist. Patient denies n/v and blood in stool. Patient eating and gently easing back into regular diet. Patient advised by this RN to be seen per protocol within 3 days, to which patient was agreeable. Patient advised by this RN to call back with worsening symptoms. Patient verbalized understanding.   Copied from CRM [id number]. Topic: General - Other >> Feb 10, 2023  9:34 AM Dimitri Ped wrote: Reason for CRM: patient is calling back concerning her call a few weeks ago .  Patient is experiencing pink word diarrhea and is losing weight. Says she is having a diverticulitis flare up but not having any vomiting or fever, just the diarrhea. This has been going on for the past 4 days and patient has been on clear liquid diet. Patient would like to know what should she/ can she do since she is unable to get in and see doctor sooner. These are the notes from the crm she is calling about and needing to know what to do Reason for Disposition  [1] Mild diarrhea (e.g., 1-3 or more stools than normal in past 24 hours) without known cause AND [2] present >  7 days  Answer Assessment - Initial Assessment Questions 1. DIARRHEA SEVERITY: "How bad is the diarrhea?" "How many more stools have you had in the past 24 hours than normal?"    - NO DIARRHEA (SCALE 0)   - MILD (SCALE  1-3): Few loose or mushy BMs; increase of 1-3 stools over normal daily number of stools; mild increase in ostomy output.   -  MODERATE (SCALE 4-7): Increase of 4-6 stools daily over normal; moderate increase in ostomy output.   -  SEVERE (SCALE 8-10; OR "WORST POSSIBLE"): Increase of 7 or more stools daily over normal; moderate increase in ostomy output; incontinence.     "I wouldn't say diarrhea but loose stools, several times a day (3x), which is more than normal for me." 2. ONSET: "When did the diarrhea begin?"      "My diverticulitis flare up happened about a month ago and I was having diarrhea. They put me on antibiotics and I finished those but I'm still having symptoms so I just wanted to know what the next step was." 3. BM CONSISTENCY: "How loose or watery is the diarrhea?"      Loose, "I'm going several times a day and I will have to go about 30 min after I eat." 4. VOMITING: "Are you also vomiting?" If Yes, ask: "How many times in the past 24 hours?"      Denies 5. ABDOMEN PAIN: "Are you having any abdomen pain?" If Yes, ask: "What does it feel like?" (e.g., crampy, dull, intermittent, constant)      Mild, relieved with BM, return when BM urge. Abdominal bloating present. 6. ABDOMEN PAIN SEVERITY: If present, ask: "How bad is the pain?"  (e.g., Scale 1-10; mild, moderate, or severe)   - MILD (1-3): doesn't interfere with normal activities, abdomen soft and not tender to touch    -  MODERATE (4-7): interferes with normal activities or awakens from sleep, abdomen tender to touch    - SEVERE (8-10): excruciating pain, doubled over, unable to do any normal activities       Mild 7. ORAL INTAKE: If vomiting, "Have you been able to drink liquids?" "How much liquids have you had in the past 24 hours?"     Water, well hydrated. 8. HYDRATION: "Any signs of dehydration?" (e.g., dry mouth [not just dry lips], too weak to stand, dizziness, new weight loss) "When did you last urinate?"     Denies 10.  ANTIBIOTIC USE: "Are you taking antibiotics now or have you taken antibiotics in the past 2 months?"       Previously taken within last 2 months. 11. OTHER SYMPTOMS: "Do you have any other symptoms?" (e.g., fever, blood in stool)       Denies  Protocols used: Diarrhea-A-AH

## 2023-02-11 ENCOUNTER — Ambulatory Visit (INDEPENDENT_AMBULATORY_CARE_PROVIDER_SITE_OTHER): Payer: Medicare PPO | Admitting: Internal Medicine

## 2023-02-11 ENCOUNTER — Encounter: Payer: Self-pay | Admitting: Internal Medicine

## 2023-02-11 ENCOUNTER — Ambulatory Visit: Payer: Medicare PPO | Admitting: Physician Assistant

## 2023-02-11 VITALS — BP 138/79 | HR 85 | Ht 65.0 in | Wt 128.4 lb

## 2023-02-11 DIAGNOSIS — K5732 Diverticulitis of large intestine without perforation or abscess without bleeding: Secondary | ICD-10-CM | POA: Diagnosis not present

## 2023-02-11 MED ORDER — AMOXICILLIN-POT CLAVULANATE 875-125 MG PO TABS: 1.0000 | ORAL_TABLET | Freq: Two times a day (BID) | ORAL | 0 refills | Status: DC

## 2023-02-11 NOTE — Progress Notes (Signed)
Acute Office Visit  Subjective:    Patient ID: Anne Aguirre, female    DOB: 1950-09-08, 73 y.o.   MRN: [id number]  Chief Complaint  Patient presents with   Diarrhea    Pt reports ongoing diarrhea for 3-4 weeks. Antibiotics helped a little but sx came back 3-4 days after treatment.    HPI Patient is in today for complaint of loose bowel movement to watery diarrhea for the last 3 to 4 weeks.  She was seen by a different provider on 01/17/23 and was given ciprofloxacin and metronidazole considering her history of diverticulitis.  She had mild improvement in diarrhea for 1 week, but her symptoms recurred after completing antibiotics.  She denies any fever, chills, melena or hematochezia.  She has lower abdominal pain, which is dull and constant.  Denies any change in abdominal pain with position change.  Denies any nausea or vomiting.  Past Medical History:  Diagnosis Date   Acute diverticulitis 08/23/2020   Anemia    Colostomy in place [facility]) 09/21/2020   COVID-19 virus infection ---- +ve on 09/01/2020 09/10/2020   Essential hypertension, benign 10/01/2018   HLD (hyperlipidemia) 10/01/2018   Osteoarthritis 10/01/2018   Post-op intraabdominal/Liver abscess-Had Hartmann's with Colostomy on 08/29/2020 for Sigmoid Diverticulits 09/10/2020   s/p ex lap and Hartman's procedure with colostomy by AR 08/29/20 for diverticulitis with abscess-- Now with Liver/intrabdominal abscess---   Sepsis Due to Intraabdomonal Abscess 09/10/2020   Vitamin D deficiency disease 10/01/2018    Past Surgical History:  Procedure Laterality Date   COLON RESECTION N/A 08/29/2020   Procedure: COLON RESECTION;  Surgeon: Axel Filler, MD;  Location: [facility] OR;  Service: General;  Laterality: N/A;   COLOSTOMY N/A 08/29/2020   Procedure: COLOSTOMY;  Surgeon: Axel Filler, MD;  Location: [facility] OR;  Service: General;  Laterality: N/A;   LAPAROTOMY N/A 08/29/2020   Procedure: EXPLORATORY LAPAROTOMY;  Surgeon: Axel Filler, MD;  Location: [facility] OR;  Service: General;  Laterality: N/A;   LYSIS OF ADHESION N/A 04/25/2021   Procedure: LYSIS OF ADHESION;  Surgeon: Andria Meuse, MD;  Location: [facility] ORS;  Service: General;  Laterality: N/A;   TONSILLECTOMY Bilateral    TOTAL HIP ARTHROPLASTY Left 06/29/2021   Procedure: LEFT TOTAL HIP ARTHROPLASTY ANTERIOR APPROACH;  Surgeon: Kathryne Hitch, MD;  Location: [facility] ORS;  Service: Orthopedics;  Laterality: Left;   XI ROBOTIC ASSISTED COLOSTOMY TAKEDOWN N/A 04/25/2021   Procedure: XI ROBOTIC ASSISTED COLOSTOMY TAKEDOWN WITH LOW ANTERIOR RESECTION, WITH BILATERAL TAP BLOCK, FLEXIBLE SIGMOIDOSCOPY, AND INTRAOPERATIVE ASSESSMENT OF PERFUSION USING FIREFLY;  Surgeon: Andria Meuse, MD;  Location: [facility] ORS;  Service: General;  Laterality: N/A;    Family History  Problem Relation Age of Onset   Hypertension Mother    Stroke Mother    Stroke Father    Hypertension Brother    Hypertension Son     Social History   Socioeconomic History   Marital status: Married    Spouse name: [name]   Number of children: 2   Years of education: Not on file   Highest education level: Bachelor's degree (e.g., BA, AB, BS)  Occupational History   Not on file  Tobacco Use   Smoking status: Never   Smokeless tobacco: Never  Vaping Use   Vaping status: Never Used  Substance and Sexual Activity   Alcohol use: Yes    Alcohol/week: 7.0 standard drinks of alcohol    Types: 7 Glasses of wine per week    Comment: occasional  wine   Drug use: Never   Sexual activity: Not on file  Other Topics Concern   Not on file  Social History Narrative   Married for 12 years.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   Social Drivers of Corporate investment banker Strain: Low Risk  (01/16/2023)   Overall Financial Resource Strain (CARDIA)    Difficulty of Paying Living Expenses: Not hard at all  Food Insecurity: No Food Insecurity (01/16/2023)   Hunger Vital Sign    Worried About Running  Out of Food in the Last Year: Never true    Ran Out of Food in the Last Year: Never true  Transportation Needs: No Transportation Needs (01/16/2023)   PRAPARE - Administrator, Civil Service (Medical): No    Lack of Transportation (Non-Medical): No  Physical Activity: Sufficiently Active (01/16/2023)   Exercise Vital Sign    Days of Exercise per Week: 5 days    Minutes of Exercise per Session: 30 min  Stress: No Stress Concern Present (01/16/2023)   [employer] of Occupational Health - Occupational Stress Questionnaire    Feeling of Stress : Only a little  Recent Concern: Stress - Stress Concern Present (12/14/2022)   [employer] of Occupational Health - Occupational Stress Questionnaire    Feeling of Stress : Very much  Social Connections: Moderately Integrated (01/16/2023)   Social Connection and Isolation Panel [NHANES]    Frequency of Communication with Friends and Family: More than three times a week    Frequency of Social Gatherings with Friends and Family: More than three times a week    Attends Religious Services: Never    Database administrator or Organizations: Yes    Attends Banker Meetings: 1 to 4 times per year    Marital Status: Married  Catering manager Violence: Not At Risk (11/27/2020)   Humiliation, Afraid, Rape, and Kick questionnaire    Fear of Current or Ex-Partner: No    Emotionally Abused: No    Physically Abused: No    Sexually Abused: No    Outpatient Medications Prior to Visit  Medication Sig Dispense Refill   acetaminophen (TYLENOL) 500 MG tablet Take 1,000 mg by mouth in the morning and at bedtime.     amLODipine (NORVASC) 10 MG tablet Take 1 tablet by mouth once daily 90 tablet 0   Camphor-Menthol-Methyl Sal (SALONPAS) 3.01-20-08 % PTCH Place 1 patch onto the skin daily as needed (pain).     gabapentin (NEURONTIN) 100 MG capsule TAKE 1 CAPSULE BY MOUTH EVERY 8 HOURS AS NEEDED 90 capsule 5   Multiple Vitamin (MULTIVITAMIN  WITH MINERALS) TABS tablet Take 1 tablet by mouth daily.     Nebivolol HCl 20 MG TABS Take 1 tablet (20 mg total) by mouth daily. 90 tablet 0   rosuvastatin (CRESTOR) 10 MG tablet Take 1 tablet (10 mg total) by mouth daily. 90 tablet 1   tiZANidine (ZANAFLEX) 4 MG tablet Take 1 tablet (4 mg total) by mouth 3 (three) times daily. 60 tablet 0   ciprofloxacin (CIPRO) 500 MG tablet Take 1 tablet (500 mg total) by mouth 2 (two) times daily. 10 tablet 0   No facility-administered medications prior to visit.    No Known Allergies  Review of Systems  Constitutional:  Negative for chills and fever.  HENT:  Negative for congestion, sinus pressure, sinus pain and sore throat.   Eyes:  Negative for pain and discharge.  Respiratory:  Negative for cough and shortness of  breath.   Cardiovascular:  Negative for chest pain and palpitations.  Gastrointestinal:  Positive for abdominal pain and diarrhea. Negative for nausea and vomiting.  Endocrine: Negative for polydipsia and polyuria.  Genitourinary:  Negative for dysuria and hematuria.  Musculoskeletal:  Positive for back pain. Negative for neck pain and neck stiffness.  Skin:  Negative for rash.  Neurological:  Negative for dizziness and weakness.  Psychiatric/Behavioral:  Negative for agitation and behavioral problems. The patient is nervous/anxious.        Objective:    Physical Exam Vitals reviewed.  Constitutional:      General: She is not in acute distress.    Appearance: She is not diaphoretic.  HENT:     Head: Normocephalic and atraumatic.     Nose: Nose normal.     Mouth/Throat:     Mouth: Mucous membranes are moist.  Eyes:     General: No scleral icterus.    Extraocular Movements: Extraocular movements intact.  Cardiovascular:     Rate and Rhythm: Normal rate and regular rhythm.     Pulses: Normal pulses.     Heart sounds: Normal heart sounds. No murmur heard. Pulmonary:     Breath sounds: Normal breath sounds. No wheezing or  rales.  Abdominal:     Palpations: Abdomen is soft.     Tenderness: There is abdominal tenderness (Mild, RLQ and LLQ).     Hernia: A hernia (Ventral) is present.     Comments: Incision site C/D/I  Musculoskeletal:     Cervical back: Neck supple. No tenderness.     Right lower leg: No edema.     Left lower leg: No edema.  Skin:    General: Skin is warm.     Findings: No rash.  Neurological:     General: No focal deficit present.     Mental Status: She is alert and oriented to person, place, and time.     Sensory: No sensory deficit.     Motor: No weakness.  Psychiatric:        Mood and Affect: Mood normal.        Behavior: Behavior normal.     BP 138/79   Pulse 85   Ht 5\' 5"  (1.651 m)   Wt 128 lb 6.4 oz (58.2 kg)   SpO2 99%   BMI 21.37 kg/m  Wt Readings from Last 3 Encounters:  02/11/23 128 lb 6.4 oz (58.2 kg)  01/17/23 128 lb 6.4 oz (58.2 kg)  12/18/22 134 lb 3.2 oz (60.9 kg)        Assessment & Plan:   Problem List Items Addressed This Visit       Digestive   Diverticulitis of colon - Primary   Recently completed ciprofloxacin and metronidazole, had transient relief of her symptoms Concern for acute colitis - check GI stool profile Considering her history of diverticulitis s/p colon resection, will restart antibiotic - started oral Augmentin, her vitals are within normal limits Advised to continue to maintain adequate hydration Referred to GI for follow-up colonoscopy      Relevant Medications   amoxicillin-clavulanate (AUGMENTIN) 875-125 MG tablet   Other Relevant Orders   GI Profile, Stool, PCR   Ambulatory referral to Gastroenterology     Meds ordered this encounter  Medications   amoxicillin-clavulanate (AUGMENTIN) 875-125 MG tablet    Sig: Take 1 tablet by mouth 2 (two) times daily.    Dispense:  14 tablet    Refill:  0  Anabel Halon, MD

## 2023-02-11 NOTE — Assessment & Plan Note (Addendum)
Recently completed ciprofloxacin and metronidazole, had transient relief of her symptoms Concern for acute colitis - check GI stool profile Considering her history of diverticulitis s/p colon resection, will restart antibiotic - started oral Augmentin, her vitals are within normal limits Advised to continue to maintain adequate hydration Referred to GI for follow-up colonoscopy

## 2023-02-11 NOTE — Patient Instructions (Signed)
Please start taking Augmentin as prescribed.  Please get stool study done as discussed.  Continue to maintain at least 64 ounces of fluid intake in a day and additional 8 ounces for each episode of watery diarrhea/loose BM.

## 2023-02-12 ENCOUNTER — Encounter (INDEPENDENT_AMBULATORY_CARE_PROVIDER_SITE_OTHER): Payer: Self-pay | Admitting: *Deleted

## 2023-02-18 ENCOUNTER — Other Ambulatory Visit: Payer: Self-pay | Admitting: Internal Medicine

## 2023-02-18 DIAGNOSIS — E782 Mixed hyperlipidemia: Secondary | ICD-10-CM

## 2023-02-25 ENCOUNTER — Other Ambulatory Visit: Payer: Self-pay | Admitting: Orthopedic Surgery

## 2023-02-25 DIAGNOSIS — M51369 Other intervertebral disc degeneration, lumbar region without mention of lumbar back pain or lower extremity pain: Secondary | ICD-10-CM

## 2023-02-26 MED ORDER — TIZANIDINE HCL 4 MG PO TABS: 4.0000 mg | ORAL_TABLET | Freq: Three times a day (TID) | ORAL | 0 refills | Status: DC

## 2023-03-18 ENCOUNTER — Encounter (INDEPENDENT_AMBULATORY_CARE_PROVIDER_SITE_OTHER): Payer: Self-pay | Admitting: Gastroenterology

## 2023-03-18 ENCOUNTER — Ambulatory Visit (INDEPENDENT_AMBULATORY_CARE_PROVIDER_SITE_OTHER): Payer: Medicare PPO | Admitting: Gastroenterology

## 2023-03-18 ENCOUNTER — Encounter (INDEPENDENT_AMBULATORY_CARE_PROVIDER_SITE_OTHER): Payer: Self-pay

## 2023-03-18 VITALS — BP 147/84 | HR 68 | Temp 98.0°F | Ht 65.0 in | Wt 130.0 lb

## 2023-03-18 DIAGNOSIS — R1031 Right lower quadrant pain: Secondary | ICD-10-CM | POA: Insufficient documentation

## 2023-03-18 DIAGNOSIS — R10813 Right lower quadrant abdominal tenderness: Secondary | ICD-10-CM

## 2023-03-18 DIAGNOSIS — R140 Abdominal distension (gaseous): Secondary | ICD-10-CM | POA: Diagnosis not present

## 2023-03-18 DIAGNOSIS — R194 Change in bowel habit: Secondary | ICD-10-CM

## 2023-03-18 DIAGNOSIS — K909 Intestinal malabsorption, unspecified: Secondary | ICD-10-CM | POA: Insufficient documentation

## 2023-03-18 DIAGNOSIS — Z8719 Personal history of other diseases of the digestive system: Secondary | ICD-10-CM | POA: Insufficient documentation

## 2023-03-18 DIAGNOSIS — K580 Irritable bowel syndrome with diarrhea: Secondary | ICD-10-CM | POA: Insufficient documentation

## 2023-03-18 MED ORDER — PEG 3350-KCL-NA BICARB-NACL 420 G PO SOLR: 4000.0000 mL | Freq: Once | ORAL | 0 refills | Status: AC

## 2023-03-18 MED ORDER — PSYLLIUM 58.6 % PO PACK: 1.0000 | PACK | Freq: Two times a day (BID) | ORAL | 2 refills | Status: AC

## 2023-03-18 NOTE — Progress Notes (Signed)
 Vista Lawman , M.D. Gastroenterology & Hepatology [facility] [address] [place], [place] [address] Primary Care Physician: Anabel Halon, MD [address] [place] [address]  Chief Complaint: Altered bowel movements, history of diverticulitis abdominal discomfort  History of Present Illness: Zariya Minner is a 73 y.o. female with history of complicated diverticulitis s/p Hartmann's with colostomy 2022 who presents for evaluation of Altered bowel movements, history of diverticulitis abdominal discomfort  Patient reports in January 2025 she all of a sudden developed altered bowel movements.with abdominal discomfort.  Patient was given ciprofloxacin and metronidazole and her symptoms persist.  Patient reports liquid stool 2-4 bowel movements daily and she would wake up in the middle of the night to defecate.  Patient reports lower abdominal cramps and discomfort unrelated to food intake.  Patient reports that after surgery for past 2 years she did well without any of the symptoms.The patient denies having any nausea, vomiting, fever, chills, hematochezia, melena, hematemesis, a jaundice, pruritus or weight loss.  Last UYQ:IHKV Last Colonoscopy:2023- [facility]     FHx: neg for any gastrointestinal/liver disease, no malignancies Social: neg smoking, alcohol or illicit drug use Surgical: Hartman status post reversal  Labs with normal liver enzymes hemoglobin 15.2 Past Medical History: Past Medical History:  Diagnosis Date   Acute diverticulitis 08/23/2020   Anemia    Colostomy in place (HCC) 09/21/2020   COVID-19 virus infection ---- +ve on 09/01/2020 09/10/2020   Essential hypertension, benign 10/01/2018   HLD (hyperlipidemia) 10/01/2018   Osteoarthritis 10/01/2018   Post-op intraabdominal/Liver abscess-Had Hartmann's with Colostomy on 08/29/2020 for Sigmoid Diverticulits 09/10/2020   s/p ex lap and Hartman's procedure with  colostomy by AR 08/29/20 for diverticulitis with abscess-- Now with Liver/intrabdominal abscess---   Sepsis Due to Intraabdomonal Abscess 09/10/2020   Vitamin D deficiency disease 10/01/2018    Past Surgical History: Past Surgical History:  Procedure Laterality Date   COLON RESECTION N/A 08/29/2020   Procedure: COLON RESECTION;  Surgeon: Axel Filler, MD;  Location: [facility] OR;  Service: General;  Laterality: N/A;   COLOSTOMY N/A 08/29/2020   Procedure: COLOSTOMY;  Surgeon: Axel Filler, MD;  Location[facility] OR;  Service: General;  Laterality: N/A;   LAPAROTOMY N/A 08/29/2020   Procedure: EXPLORATORY LAPAROTOMY;  Surgeon: Axel Filler, MD;  Location: [facility] OR;  Service: General;  Laterality: N/A;   LYSIS OF ADHESION N/A 04/25/2021   Procedure: LYSIS OF ADHESION;  Surgeon: Andria Meuse, MD;  Location: [facility] ORS;  Service: General;  Laterality: N/A;   TONSILLECTOMY Bilateral    TOTAL HIP ARTHROPLASTY Left 06/29/2021   Procedure: LEFT TOTAL HIP ARTHROPLASTY ANTERIOR APPROACH;  Surgeon: Kathryne Hitch, MD;  Location: [facility] ORS;  Service: Orthopedics;  Laterality: Left;   XI ROBOTIC ASSISTED COLOSTOMY TAKEDOWN N/A 04/25/2021   Procedure: XI ROBOTIC ASSISTED COLOSTOMY TAKEDOWN WITH LOW ANTERIOR RESECTION, WITH BILATERAL TAP BLOCK, FLEXIBLE SIGMOIDOSCOPY, AND INTRAOPERATIVE ASSESSMENT OF PERFUSION USING FIREFLY;  Surgeon: Andria Meuse, MD;  Location: [facility] ORS;  Service: General;  Laterality: N/A;    Family History: Family History  Problem Relation Age of Onset   Hypertension Mother    Stroke Mother    Stroke Father    Hypertension Brother    Hypertension Son     Social History: Social History   Tobacco Use  Smoking Status Never  Smokeless Tobacco Never   Social History   Substance and Sexual Activity  Alcohol Use Yes   Alcohol/week: 7.0 standard drinks  of alcohol   Types: 7 Glasses of wine per week   Comment: occasional wine   Social History   Substance and  Sexual Activity  Drug Use Never    Allergies: No Known Allergies  Medications: Current Outpatient Medications  Medication Sig Dispense Refill   acetaminophen (TYLENOL) 500 MG tablet Take 1,000 mg by mouth in the morning and at bedtime.     amLODipine (NORVASC) 10 MG tablet Take 1 tablet by mouth once daily 90 tablet 0   Camphor-Menthol-Methyl Sal (SALONPAS) 3.01-20-08 % PTCH Place 1 patch onto the skin daily as needed (pain).     gabapentin (NEURONTIN) 100 MG capsule TAKE 1 CAPSULE BY MOUTH EVERY 8 HOURS AS NEEDED 90 capsule 5   Multiple Vitamin (MULTIVITAMIN WITH MINERALS) TABS tablet Take 1 tablet by mouth daily.     Nebivolol HCl 20 MG TABS Take 1 tablet (20 mg total) by mouth daily. 90 tablet 0   rosuvastatin (CRESTOR) 10 MG tablet Take 1 tablet by mouth once daily 90 tablet 0   tiZANidine (ZANAFLEX) 4 MG tablet Take 1 tablet (4 mg total) by mouth 3 (three) times daily. 60 tablet 0   VITAMIN D PO Take by mouth.     No current facility-administered medications for this visit.    Review of Systems: GENERAL: negative for malaise, night sweats HEENT: No changes in hearing or vision, no nose bleeds or other nasal problems. NECK: Negative for lumps, goiter, pain and significant neck swelling RESPIRATORY: Negative for cough, wheezing CARDIOVASCULAR: Negative for chest pain, leg swelling, palpitations, orthopnea GI: SEE HPI MUSCULOSKELETAL: Negative for joint pain or swelling, back pain, and muscle pain. SKIN: Negative for lesions, rash HEMATOLOGY Negative for prolonged bleeding, bruising easily, and swollen nodes. ENDOCRINE: Negative for cold or heat intolerance, polyuria, polydipsia and goiter. NEURO: negative for tremor, gait imbalance, syncope and seizures. The remainder of the review of systems is noncontributory.   Physical Exam: BP (!) 147/84   Pulse 68   Temp 98 F (36.7 C)   Ht 5\' 5"  (1.651 m)   Wt 130 lb (59 kg)   BMI 21.63 kg/m  GENERAL: The patient is AO x3, in no  acute distress. HEENT: Head is normocephalic and atraumatic. EOMI are intact. Mouth is well hydrated and without lesions. NECK: Supple. No masses LUNGS: Clear to auscultation. No presence of rhonchi/wheezing/rales. Adequate chest expansion HEART: RRR, normal s1 and s2. ABDOMEN: Soft, mild tenderness on RLQ on deep palpation , no guarding, no peritoneal signs, and nondistended. BS +. No masses.   Imaging/Labs: as above     Latest Ref Rng & Units 12/19/2022   11:08 AM 11/15/2021    1:35 PM 06/30/2021    3:13 AM  CBC  WBC 3.4 - 10.8 x10E3/uL 9.0  9.3  12.8   Hemoglobin 11.1 - 15.9 g/dL 40.9  81.1  91.4   Hematocrit 34.0 - 46.6 % 46.4  45.7  37.5   Platelets 150 - 450 x10E3/uL 326  315  267    Lab Results  Component Value Date   IRON 10 (L) 09/26/2020   TIBC 231 (L) 09/26/2020   FERRITIN 517 (H) 09/26/2020    I personally reviewed and interpreted the available labs, imaging and endoscopic files.  Impression and Plan: Keshawn Sundberg is a 73 y.o. female with history of complicated diverticulitis s/p Hartmann's with colostomy 2022 who presents for evaluation of Altered bowel movements, history of diverticulitis abdominal discomfort  #Altered bowel movements #Abdominal discomfort  #History of  Diverticulitis   Patient does have nocturnal symptoms where she would wake up in the middle of the night to defecate, need to rule out microscopic colitis.  Hence we will proceed with colonoscopy with colonic biopsies  This could be IBS diarrhea type or SIBO as patient has abdominal discomfort accompanying with change in bowel frequency and abdominal distention  Given right lower quadrant minimal tenderness will obtain CT abdomen pelvis to evaluate for any active diverticulitis  For now we will treat medically with bulking agents with Metamucil and for abdominal discomfort IBgard 1-2 times daily  Will obtain stool GI PCR and C. difficile since patient was exposed to antibiotics and continues to  have diarrhea  All questions were answered.      Vista Lawman, MD Gastroenterology and Hepatology [facility]   This chart has been completed using [facility] Dictation software, and while attempts have been made to ensure accuracy , certain words and phrases may not be transcribed as intended

## 2023-03-18 NOTE — Patient Instructions (Addendum)
 It was very nice to meet you today, as dicussed with will plan for the following :  1) CT Abdomen and pelvis  2) Stool studies  3) Ensure adequate fluid intake: Aim for 8 glasses of water daily. Follow a high fiber diet: Include foods such as dates, prunes, pears, and kiwi. Take Miralax twice a day for the first week, then reduce to once daily thereafter.  4) IB Guard 1-2 times daily

## 2023-03-18 NOTE — H&P (View-Only) (Signed)
 Vista Lawman , M.D. Gastroenterology & Hepatology [facility] [address] [place], [place] [address] Primary Care Physician: Anabel Halon, MD [address] [place] [address]  Chief Complaint: Altered bowel movements, history of diverticulitis abdominal discomfort  History of Present Illness: Anne Aguirre is a 73 y.o. female with history of complicated diverticulitis s/p Hartmann's with colostomy 2022 who presents for evaluation of Altered bowel movements, history of diverticulitis abdominal discomfort  Patient reports in January 2025 she all of a sudden developed altered bowel movements.with abdominal discomfort.  Patient was given ciprofloxacin and metronidazole and her symptoms persist.  Patient reports liquid stool 2-4 bowel movements daily and she would wake up in the middle of the night to defecate.  Patient reports lower abdominal cramps and discomfort unrelated to food intake.  Patient reports that after surgery for past 2 years she did well without any of the symptoms.The patient denies having any nausea, vomiting, fever, chills, hematochezia, melena, hematemesis, a jaundice, pruritus or weight loss.  Last UYQ:IHKV Last Colonoscopy:2023- [facility]     FHx: neg for any gastrointestinal/liver disease, no malignancies Social: neg smoking, alcohol or illicit drug use Surgical: Hartman status post reversal  Labs with normal liver enzymes hemoglobin 15.2 Past Medical History: Past Medical History:  Diagnosis Date   Acute diverticulitis 08/23/2020   Anemia    Colostomy in place (HCC) 09/21/2020   COVID-19 virus infection ---- +ve on 09/01/2020 09/10/2020   Essential hypertension, benign 10/01/2018   HLD (hyperlipidemia) 10/01/2018   Osteoarthritis 10/01/2018   Post-op intraabdominal/Liver abscess-Had Hartmann's with Colostomy on 08/29/2020 for Sigmoid Diverticulits 09/10/2020   s/p ex lap and Hartman's procedure with  colostomy by AR 08/29/20 for diverticulitis with abscess-- Now with Liver/intrabdominal abscess---   Sepsis Due to Intraabdomonal Abscess 09/10/2020   Vitamin D deficiency disease 10/01/2018    Past Surgical History: Past Surgical History:  Procedure Laterality Date   COLON RESECTION N/A 08/29/2020   Procedure: COLON RESECTION;  Surgeon: Axel Filler, MD;  Location: [facility] OR;  Service: General;  Laterality: N/A;   COLOSTOMY N/A 08/29/2020   Procedure: COLOSTOMY;  Surgeon: Axel Filler, MD;  Location: [facility] OR;  Service: General;  Laterality: N/A;   LAPAROTOMY N/A 08/29/2020   Procedure: EXPLORATORY LAPAROTOMY;  Surgeon: Axel Filler, MD;  Location: [facility] OR;  Service: General;  Laterality: N/A;   LYSIS OF ADHESION N/A 04/25/2021   Procedure: LYSIS OF ADHESION;  Surgeon: Andria Meuse, MD;  Location: [facility]S;  Service: General;  Laterality: N/A;   TONSILLECTOMY Bilateral    TOTAL HIP ARTHROPLASTY Left 06/29/2021   Procedure: LEFT TOTAL HIP ARTHROPLASTY ANTERIOR APPROACH;  Surgeon: Kathryne Hitch, MD;  Location: [facility];  Service: Orthopedics;  Laterality: Left;   XI ROBOTIC ASSISTED COLOSTOMY TAKEDOWN N/A 04/25/2021   Procedure: XI ROBOTIC ASSISTED COLOSTOMY TAKEDOWN WITH LOW ANTERIOR RESECTION, WITH BILATERAL TAP BLOCK, FLEXIBLE SIGMOIDOSCOPY, AND INTRAOPERATIVE ASSESSMENT OF PERFUSION USING FIREFLY;  Surgeon: Andria Meuse, MD;  Location: [facility];  Service: General;  Laterality: N/A;    Family History: Family History  Problem Relation Age of Onset   Hypertension Mother    Stroke Mother    Stroke Father    Hypertension Brother    Hypertension Son     Social History: Social History   Tobacco Use  Smoking Status Never  Smokeless Tobacco Never   Social History   Substance and Sexual Activity  Alcohol Use Yes   Alcohol/week: 7.0 standard drinks  of alcohol   Types: 7 Glasses of wine per week   Comment: occasional wine   Social History   Substance and  Sexual Activity  Drug Use Never    Allergies: No Known Allergies  Medications: Current Outpatient Medications  Medication Sig Dispense Refill   acetaminophen (TYLENOL) 500 MG tablet Take 1,000 mg by mouth in the morning and at bedtime.     amLODipine (NORVASC) 10 MG tablet Take 1 tablet by mouth once daily 90 tablet 0   Camphor-Menthol-Methyl Sal (SALONPAS) 3.01-20-08 % PTCH Place 1 patch onto the skin daily as needed (pain).     gabapentin (NEURONTIN) 100 MG capsule TAKE 1 CAPSULE BY MOUTH EVERY 8 HOURS AS NEEDED 90 capsule 5   Multiple Vitamin (MULTIVITAMIN WITH MINERALS) TABS tablet Take 1 tablet by mouth daily.     Nebivolol HCl 20 MG TABS Take 1 tablet (20 mg total) by mouth daily. 90 tablet 0   rosuvastatin (CRESTOR) 10 MG tablet Take 1 tablet by mouth once daily 90 tablet 0   tiZANidine (ZANAFLEX) 4 MG tablet Take 1 tablet (4 mg total) by mouth 3 (three) times daily. 60 tablet 0   VITAMIN D PO Take by mouth.     No current facility-administered medications for this visit.    Review of Systems: GENERAL: negative for malaise, night sweats HEENT: No changes in hearing or vision, no nose bleeds or other nasal problems. NECK: Negative for lumps, goiter, pain and significant neck swelling RESPIRATORY: Negative for cough, wheezing CARDIOVASCULAR: Negative for chest pain, leg swelling, palpitations, orthopnea GI: SEE HPI MUSCULOSKELETAL: Negative for joint pain or swelling, back pain, and muscle pain. SKIN: Negative for lesions, rash HEMATOLOGY Negative for prolonged bleeding, bruising easily, and swollen nodes. ENDOCRINE: Negative for cold or heat intolerance, polyuria, polydipsia and goiter. NEURO: negative for tremor, gait imbalance, syncope and seizures. The remainder of the review of systems is noncontributory.   Physical Exam: BP (!) 147/84   Pulse 68   Temp 98 F (36.7 C)   Ht 5\' 5"  (1.651 m)   Wt 130 lb (59 kg)   BMI 21.63 kg/m  GENERAL: The patient is AO x3, in no  acute distress. HEENT: Head is normocephalic and atraumatic. EOMI are intact. Mouth is well hydrated and without lesions. NECK: Supple. No masses LUNGS: Clear to auscultation. No presence of rhonchi/wheezing/rales. Adequate chest expansion HEART: RRR, normal s1 and s2. ABDOMEN: Soft, mild tenderness on RLQ on deep palpation , no guarding, no peritoneal signs, and nondistended. BS +. No masses.   Imaging/Labs: as above     Latest Ref Rng & Units 12/19/2022   11:08 AM 11/15/2021    1:35 PM 06/30/2021    3:13 AM  CBC  WBC 3.4 - 10.8 x10E3/uL 9.0  9.3  12.8   Hemoglobin 11.1 - 15.9 g/dL 40.9  81.1  91.4   Hematocrit 34.0 - 46.6 % 46.4  45.7  37.5   Platelets 150 - 450 x10E3/uL 326  315  267    Lab Results  Component Value Date   IRON 10 (L) 09/26/2020   TIBC 231 (L) 09/26/2020   FERRITIN 517 (H) 09/26/2020    I personally reviewed and interpreted the available labs, imaging and endoscopic files.  Impression and Plan: Anne Aguirre is a 73 y.o. female with history of complicated diverticulitis s/p Hartmann's with colostomy 2022 who presents for evaluation of Altered bowel movements, history of diverticulitis abdominal discomfort  #Altered bowel movements #Abdominal discomfort  #History of  Diverticulitis   Patient does have nocturnal symptoms where she would wake up in the middle of the night to defecate, need to rule out microscopic colitis.  Hence we will proceed with colonoscopy with colonic biopsies  This could be IBS diarrhea type or SIBO as patient has abdominal discomfort accompanying with change in bowel frequency and abdominal distention  Given right lower quadrant minimal tenderness will obtain CT abdomen pelvis to evaluate for any active diverticulitis  For now we will treat medically with bulking agents with Metamucil and for abdominal discomfort IBgard 1-2 times daily  Will obtain stool GI PCR and C. difficile since patient was exposed to antibiotics and continues to  have diarrhea  All questions were answered.      Vista Lawman, MD Gastroenterology and Hepatology [facility]   This chart has been completed using [facility] Dictation software, and while attempts have been made to ensure accuracy , certain words and phrases may not be transcribed as intended

## 2023-03-24 ENCOUNTER — Other Ambulatory Visit: Payer: Self-pay | Admitting: Internal Medicine

## 2023-03-24 DIAGNOSIS — I10 Essential (primary) hypertension: Secondary | ICD-10-CM

## 2023-03-24 MED ORDER — AMLODIPINE BESYLATE 10 MG PO TABS
10.0000 mg | ORAL_TABLET | Freq: Every day | ORAL | 0 refills | Status: DC
Start: 2023-03-24 — End: 2023-06-18

## 2023-03-24 MED ORDER — NEBIVOLOL HCL 20 MG PO TABS: 1.0000 | ORAL_TABLET | Freq: Every day | ORAL | 0 refills | Status: DC

## 2023-03-25 ENCOUNTER — Other Ambulatory Visit: Payer: Self-pay | Admitting: Orthopedic Surgery

## 2023-03-25 ENCOUNTER — Other Ambulatory Visit: Payer: Self-pay | Admitting: Internal Medicine

## 2023-03-25 DIAGNOSIS — M51369 Other intervertebral disc degeneration, lumbar region without mention of lumbar back pain or lower extremity pain: Secondary | ICD-10-CM

## 2023-03-25 DIAGNOSIS — I10 Essential (primary) hypertension: Secondary | ICD-10-CM

## 2023-03-26 ENCOUNTER — Ambulatory Visit (HOSPITAL_COMMUNITY): Admitting: Anesthesiology

## 2023-03-26 ENCOUNTER — Ambulatory Visit (HOSPITAL_COMMUNITY)
Admission: RE | Admit: 2023-03-26 | Discharge: 2023-03-26 | Disposition: A | Discharge status: Home / Self Care | Attending: Gastroenterology | Admitting: Gastroenterology

## 2023-03-26 ENCOUNTER — Encounter (INDEPENDENT_AMBULATORY_CARE_PROVIDER_SITE_OTHER): Payer: Self-pay | Admitting: *Deleted

## 2023-03-26 ENCOUNTER — Other Ambulatory Visit: Payer: Self-pay

## 2023-03-26 ENCOUNTER — Encounter (HOSPITAL_COMMUNITY)
Admission: RE | Disposition: A | Discharge status: Home / Self Care | Payer: Self-pay | Source: Home / Self Care | Attending: Gastroenterology

## 2023-03-26 ENCOUNTER — Encounter (HOSPITAL_COMMUNITY): Payer: Self-pay | Admitting: Gastroenterology

## 2023-03-26 DIAGNOSIS — K648 Other hemorrhoids: Secondary | ICD-10-CM

## 2023-03-26 DIAGNOSIS — K5732 Diverticulitis of large intestine without perforation or abscess without bleeding: Secondary | ICD-10-CM | POA: Diagnosis not present

## 2023-03-26 DIAGNOSIS — K52832 Lymphocytic colitis: Secondary | ICD-10-CM

## 2023-03-26 DIAGNOSIS — E785 Hyperlipidemia, unspecified: Secondary | ICD-10-CM

## 2023-03-26 DIAGNOSIS — Z8249 Family history of ischemic heart disease and other diseases of the circulatory system: Secondary | ICD-10-CM | POA: Diagnosis not present

## 2023-03-26 DIAGNOSIS — Z980 Intestinal bypass and anastomosis status: Secondary | ICD-10-CM | POA: Diagnosis not present

## 2023-03-26 DIAGNOSIS — Z8719 Personal history of other diseases of the digestive system: Secondary | ICD-10-CM | POA: Insufficient documentation

## 2023-03-26 DIAGNOSIS — K649 Unspecified hemorrhoids: Secondary | ICD-10-CM | POA: Diagnosis not present

## 2023-03-26 DIAGNOSIS — K529 Noninfective gastroenteritis and colitis, unspecified: Secondary | ICD-10-CM

## 2023-03-26 DIAGNOSIS — K644 Residual hemorrhoidal skin tags: Secondary | ICD-10-CM

## 2023-03-26 DIAGNOSIS — I10 Essential (primary) hypertension: Secondary | ICD-10-CM | POA: Insufficient documentation

## 2023-03-26 DIAGNOSIS — R197 Diarrhea, unspecified: Secondary | ICD-10-CM

## 2023-03-26 HISTORY — PX: COLONOSCOPY: SHX5424

## 2023-03-26 LAB — HM COLONOSCOPY

## 2023-03-26 SURGERY — COLONOSCOPY
Anesthesia: General

## 2023-03-26 MED ORDER — PROPOFOL 10 MG/ML IV BOLUS
INTRAVENOUS | Status: DC | PRN
Start: 2023-03-26 — End: 2023-03-26
  Administered 2023-03-26: 80 mg via INTRAVENOUS

## 2023-03-26 MED ORDER — PROPOFOL 500 MG/50ML IV EMUL
INTRAVENOUS | Status: AC
  Filled 2023-03-26: qty 50

## 2023-03-26 MED ORDER — PROPOFOL 500 MG/50ML IV EMUL
INTRAVENOUS | Status: DC | PRN
  Administered 2023-03-26: 125 ug/kg/min via INTRAVENOUS

## 2023-03-26 MED ORDER — TIZANIDINE HCL 4 MG PO TABS: 4.0000 mg | ORAL_TABLET | Freq: Three times a day (TID) | ORAL | 0 refills | Status: DC

## 2023-03-26 MED ORDER — LACTATED RINGERS IV SOLN
INTRAVENOUS | Status: DC
  Administered 2023-03-26: 500 mL via INTRAVENOUS

## 2023-03-26 MED ORDER — LIDOCAINE HCL (CARDIAC) PF 100 MG/5ML IV SOSY
PREFILLED_SYRINGE | INTRAVENOUS | Status: DC | PRN
  Administered 2023-03-26: 60 mg via INTRATRACHEAL

## 2023-03-26 MED ORDER — LIDOCAINE HCL (PF) 2 % IJ SOLN
INTRAMUSCULAR | Status: AC
  Filled 2023-03-26: qty 5

## 2023-03-26 NOTE — Discharge Instructions (Signed)

## 2023-03-26 NOTE — Anesthesia Preprocedure Evaluation (Signed)
 Anesthesia Evaluation  Patient identified by MRN, date of birth, ID band Patient awake    Reviewed: Allergy & Precautions, H&P , NPO status , Patient's Chart, lab work & pertinent test results, reviewed documented beta blocker date and time   Airway Mallampati: II  TM Distance: >3 FB Neck ROM: full    Dental no notable dental hx.    Pulmonary neg pulmonary ROS   Pulmonary exam normal breath sounds clear to auscultation       Cardiovascular Exercise Tolerance: Good hypertension, negative cardio ROS  Rhythm:regular Rate:Normal     Neuro/Psych  PSYCHIATRIC DISORDERS      negative neurological ROS  negative psych ROS   GI/Hepatic negative GI ROS, Neg liver ROS,,,  Endo/Other  negative endocrine ROS    Renal/GU negative Renal ROS  negative genitourinary   Musculoskeletal   Abdominal   Peds  Hematology negative hematology ROS (+) Blood dyscrasia, anemia   Anesthesia Other Findings   Reproductive/Obstetrics negative OB ROS                             Anesthesia Physical Anesthesia Plan  ASA: 3  Anesthesia Plan: General   Post-op Pain Management:    Induction:   PONV Risk Score and Plan: Propofol infusion  Airway Management Planned:   Additional Equipment:   Intra-op Plan:   Post-operative Plan:   Informed Consent: I have reviewed the patients History and Physical, chart, labs and discussed the procedure including the risks, benefits and alternatives for the proposed anesthesia with the patient or authorized representative who has indicated his/her understanding and acceptance.     Dental Advisory Given  Plan Discussed with: CRNA  Anesthesia Plan Comments:        Anesthesia Quick Evaluation

## 2023-03-26 NOTE — Interval H&P Note (Signed)
 History and Physical Interval Note:  03/26/2023 12:49 PM  Anne Aguirre  has presented today for surgery, with the diagnosis of ALTERED BM, HISTORY OF DIVERICULITIS, DIARRHEA.  The various methods of treatment have been discussed with the patient and family. After consideration of risks, benefits and other options for treatment, the patient has consented to  Procedure(s) with comments: COLONOSCOPY (N/A) - 1:30PM;ASA 1-2 as a surgical intervention.  The patient's history has been reviewed, patient examined, no change in status, stable for surgery.  I have reviewed the patient's chart and labs.  Questions were answered to the patient's satisfaction.     [name]

## 2023-03-26 NOTE — Op Note (Signed)
 [facility] Patient Name: Anne Aguirre Procedure Date: 03/26/2023 12:47 PM MRN: [id number] Date of Birth: 25-Jun-1950 Attending MD: Sanjuan Dame , MD, [id number] CSN: [id number] Age: 73 Admit Type: Outpatient Procedure:                Colonoscopy Indications:              Chronic diarrhea, Follow-up of diverticulitis Providers:                Sanjuan Dame, MD, Sheran Fava, Dyann Ruddle Referring MD:              Medicines:                Monitored Anesthesia Care Complications:            No immediate complications. Estimated Blood Loss:     Estimated blood loss: none. Procedure:                Pre-Anesthesia Assessment:                           - Prior to the procedure, a History and Physical                            was performed, and patient medications and                            allergies were reviewed. The patient's tolerance of                            previous anesthesia was also reviewed. The risks                            and benefits of the procedure and the sedation                            options and risks were discussed with the patient.                            All questions were answered, and informed consent                            was obtained. Prior Anticoagulants: The patient has                            taken no anticoagulant or antiplatelet agents. ASA                            Grade Assessment: II - A patient with mild systemic                            disease. After reviewing the risks and benefits,                            the patient was deemed in satisfactory condition to  undergo the procedure.                           After obtaining informed consent, the colonoscope                            was passed under direct vision. Throughout the                            procedure, the patient's blood pressure, pulse, and                            oxygen saturations were monitored continuously.  The                            [id number]) scope was introduced through the                            anus and advanced to the the cecum, identified by                            appendiceal orifice and ileocecal valve. The                            colonoscopy was performed without difficulty. The                            patient tolerated the procedure well. The quality                            of the bowel preparation was evaluated using the                            BBPS [facility] [address] Bowel Preparation Scale) with scores                            of: Right Colon = 3, Transverse Colon = 3 and Left                            Colon = 3 (entire mucosa seen well with no residual                            staining, small fragments of stool or opaque                            liquid). The total BBPS score equals 9. The                            ileocecal valve, appendiceal orifice, and rectum                            were photographed. Scope In: 1:07:33 PM Scope Out: 1:22:01 PM Scope Withdrawal Time: 0 hours 10 minutes 48 seconds  Total  Procedure Duration: 0 hours 14 minutes 28 seconds  Findings:      The perianal and digital rectal examinations were normal.      There was evidence of a prior side-to-side colo-colonic anastomosis in       the sigmoid colon. This was patent and was characterized by healthy       appearing mucosa. The anastomosis was traversed.      There is no endoscopic evidence of inflammation or polyps in the entire       colon. Biopsies for histology were taken with a cold forceps from the       entire colon for evaluation of microscopic colitis.      Non-bleeding external and internal hemorrhoids were found during       retroflexion. The hemorrhoids were small. Impression:               - Patent side-to-side colo-colonic anastomosis,                            characterized by healthy appearing mucosa.                           - Non-bleeding external and  internal hemorrhoids. Moderate Sedation:      Per Anesthesia Care Recommendation:           - Patient has a contact number available for                            emergencies. The signs and symptoms of potential                            delayed complications were discussed with the                            patient. Return to normal activities tomorrow.                            Written discharge instructions were provided to the                            patient.                           - High fiber diet.                           - Continue present medications.                           - Await pathology results.                           - Repeat colonoscopy in 10 years for screening                            purposes if medically fit                           - Return to GI  office as previously scheduled. Procedure Code(s):        --- Professional ---                           [phone], Colonoscopy, flexible; with biopsy, single                            or multiple Diagnosis Code(s):        --- Professional ---                           Z98.0, Intestinal bypass and anastomosis status                           K64.8, Other hemorrhoids                           K52.9, Noninfective gastroenteritis and colitis,                            unspecified                           K57.32, Diverticulitis of large intestine without                            perforation or abscess without bleeding CPT copyright 2022 American Medical Association. All rights reserved. The codes documented in this report are preliminary and upon coder review may  be revised to meet current compliance requirements. Sanjuan Dame, MD Sanjuan Dame, MD 03/26/2023 1:28:30 PM This report has been signed electronically. Number of Addenda: 0

## 2023-03-26 NOTE — Transfer of Care (Addendum)
 Immediate Anesthesia Transfer of Care Note  Patient: Anne Aguirre  Procedure(s) Performed: COLONOSCOPY  Patient Location: Endoscopy Unit  Anesthesia Type:General  Level of Consciousness: awake and patient cooperative  Airway & Oxygen Therapy: Patient Spontanous Breathing  Post-op Assessment: Report given to RN and Post -op Vital signs reviewed and stable  Post vital signs: Reviewed and stable  Last Vitals:  Vitals Value Taken Time  BP 89/60 03/26/23   1327  Temp 36.6 03/26/23   1327  Pulse 69 03/26/23   1327  Resp 20 03/26/23   1327  SpO2 99% 03/26/23   1327    Last Pain:  Vitals:   03/26/23 1305  TempSrc:   PainSc: 0-No pain      Patients Stated Pain Goal: 4 (03/26/23 1225)  Complications: Subsequent blood pressure 117/71 on 03/26/23 at 1336.

## 2023-03-27 ENCOUNTER — Encounter (HOSPITAL_COMMUNITY): Payer: Self-pay | Admitting: Gastroenterology

## 2023-03-28 LAB — SURGICAL PATHOLOGY

## 2023-03-28 NOTE — Anesthesia Postprocedure Evaluation (Signed)
 Anesthesia Post Note  Patient: Antwan Bribiesca  Procedure(s) Performed: COLONOSCOPY  Patient location during evaluation: Phase II Anesthesia Type: General Level of consciousness: awake Pain management: pain level controlled Vital Signs Assessment: post-procedure vital signs reviewed and stable Respiratory status: spontaneous breathing and respiratory function stable Cardiovascular status: blood pressure returned to baseline and stable Postop Assessment: no headache and no apparent nausea or vomiting Anesthetic complications: no Comments: Late entry   No notable events documented.   Last Vitals:  Vitals:   03/26/23 1327 03/26/23 1336  BP: (!) 89/60 117/71  Pulse: 69   Resp: 20   Temp: 36.6 C   SpO2: 99%     Last Pain:  Vitals:   03/26/23 1327  TempSrc: Oral  PainSc: 0-No pain                 Windell Norfolk

## 2023-03-31 ENCOUNTER — Other Ambulatory Visit (HOSPITAL_COMMUNITY): Payer: Self-pay | Admitting: Gastroenterology

## 2023-03-31 DIAGNOSIS — K52832 Lymphocytic colitis: Secondary | ICD-10-CM

## 2023-03-31 MED ORDER — BUDESONIDE ER 9 MG PO TB24: 9.0000 mg | ORAL_TABLET | Freq: Every day | ORAL | 0 refills | Status: DC

## 2023-03-31 NOTE — Progress Notes (Signed)
 I reviewed the pathology results. Anne Aguirre, can you send her a letter with the findings as described below please?  Repeat colonoscopy in 10 years  Thanks,  Vista Lawman, MD Gastroenterology and Hepatology [facility]  ---------------------------------------------------------------------------------------------  [facility] [address], [place], [place] [address] Phone:  [phone]   03/31/23 [place], [place]  Dear Anne Aguirre,  I am writing to inform you that the biopsies taken during your recent endoscopic examination showed:  FINAL MICROSCOPIC DIAGNOSIS:   A. COLON, RANDOM, BIOPSY:       Lymphocytic colitis.       Negative for dysplasia or malignancy.    What does this mean?  Good news we found the cause of your diarrhea  - colon biopsies show the cause of diarrhea, something called Lymphocytic colitis. There are certain medications which can cause it such as PPI (ie: Omeprazole), SSRI ( eg: sertraline) and NSAIDs ( eg: ibuprofen,naproxen) which I would advice to avoid   Going to prescribe medicine to help.   Will prescribe oral budesonide 9mg  daily for 6-8 weeks .  This will be tapered to 6mg  for two weeks , followed by 3mg  for another two weeks and then discontinued  Please continue to see [place] in GI clinic   Also I value your feedback , so if you get a survey , please take the time to fill it out and thank you for choosing [facility]  Please call us at [phone] if you have persistent problems or have questions about your condition that have not been fully answered at this time.  Sincerely,  Vista Lawman, MD Gastroenterology and Hepatology

## 2023-04-01 ENCOUNTER — Encounter (INDEPENDENT_AMBULATORY_CARE_PROVIDER_SITE_OTHER): Payer: Self-pay

## 2023-04-01 DIAGNOSIS — K52832 Lymphocytic colitis: Secondary | ICD-10-CM | POA: Insufficient documentation

## 2023-04-02 ENCOUNTER — Encounter (INDEPENDENT_AMBULATORY_CARE_PROVIDER_SITE_OTHER): Payer: Self-pay | Admitting: *Deleted

## 2023-04-03 ENCOUNTER — Other Ambulatory Visit (INDEPENDENT_AMBULATORY_CARE_PROVIDER_SITE_OTHER): Payer: Self-pay

## 2023-04-03 ENCOUNTER — Telehealth (INDEPENDENT_AMBULATORY_CARE_PROVIDER_SITE_OTHER): Payer: Self-pay

## 2023-04-03 DIAGNOSIS — K52832 Lymphocytic colitis: Secondary | ICD-10-CM

## 2023-04-03 MED ORDER — BUDESONIDE 3 MG PO CPEP: ORAL_CAPSULE | ORAL | 0 refills | Status: DC

## 2023-04-03 NOTE — Telephone Encounter (Signed)
 Hi Crystal Thank you for looking into this ,yes please lets prescribe her Entocort

## 2023-04-03 NOTE — Telephone Encounter (Signed)
 Budesonide Er 9 mg tablet Denied, per Patient [employer] plan. They say they cover this medication when there criteria is met. They have to have tried or cannot use at least two of the following Sulfasalazine, Balsalazide Capsules, Mesalamine enema, mesalamine 0.375 g extended release capsule. Did you want to try Budesonide/Entocort 3 mg taper? Start with three 3 mg tablets (9mg ) for 56 days, then decrease to two 3 mg tablets (6mg ) for two weeks, then decrease to one (3mg ) tablet for two weeks then discontinue? Please advise. Thanks,

## 2023-04-03 NOTE — Telephone Encounter (Signed)
 Script sent to [facility] for Budesonide 3 mg tablet taper.

## 2023-04-09 IMAGING — DX DG CHEST 2V
2 series · 2 of 2 positions shown · non-contrast
Comparison: None.

CLINICAL DATA: Nausea vomiting

EXAM:
CHEST - 2 VIEW

[chest lat]
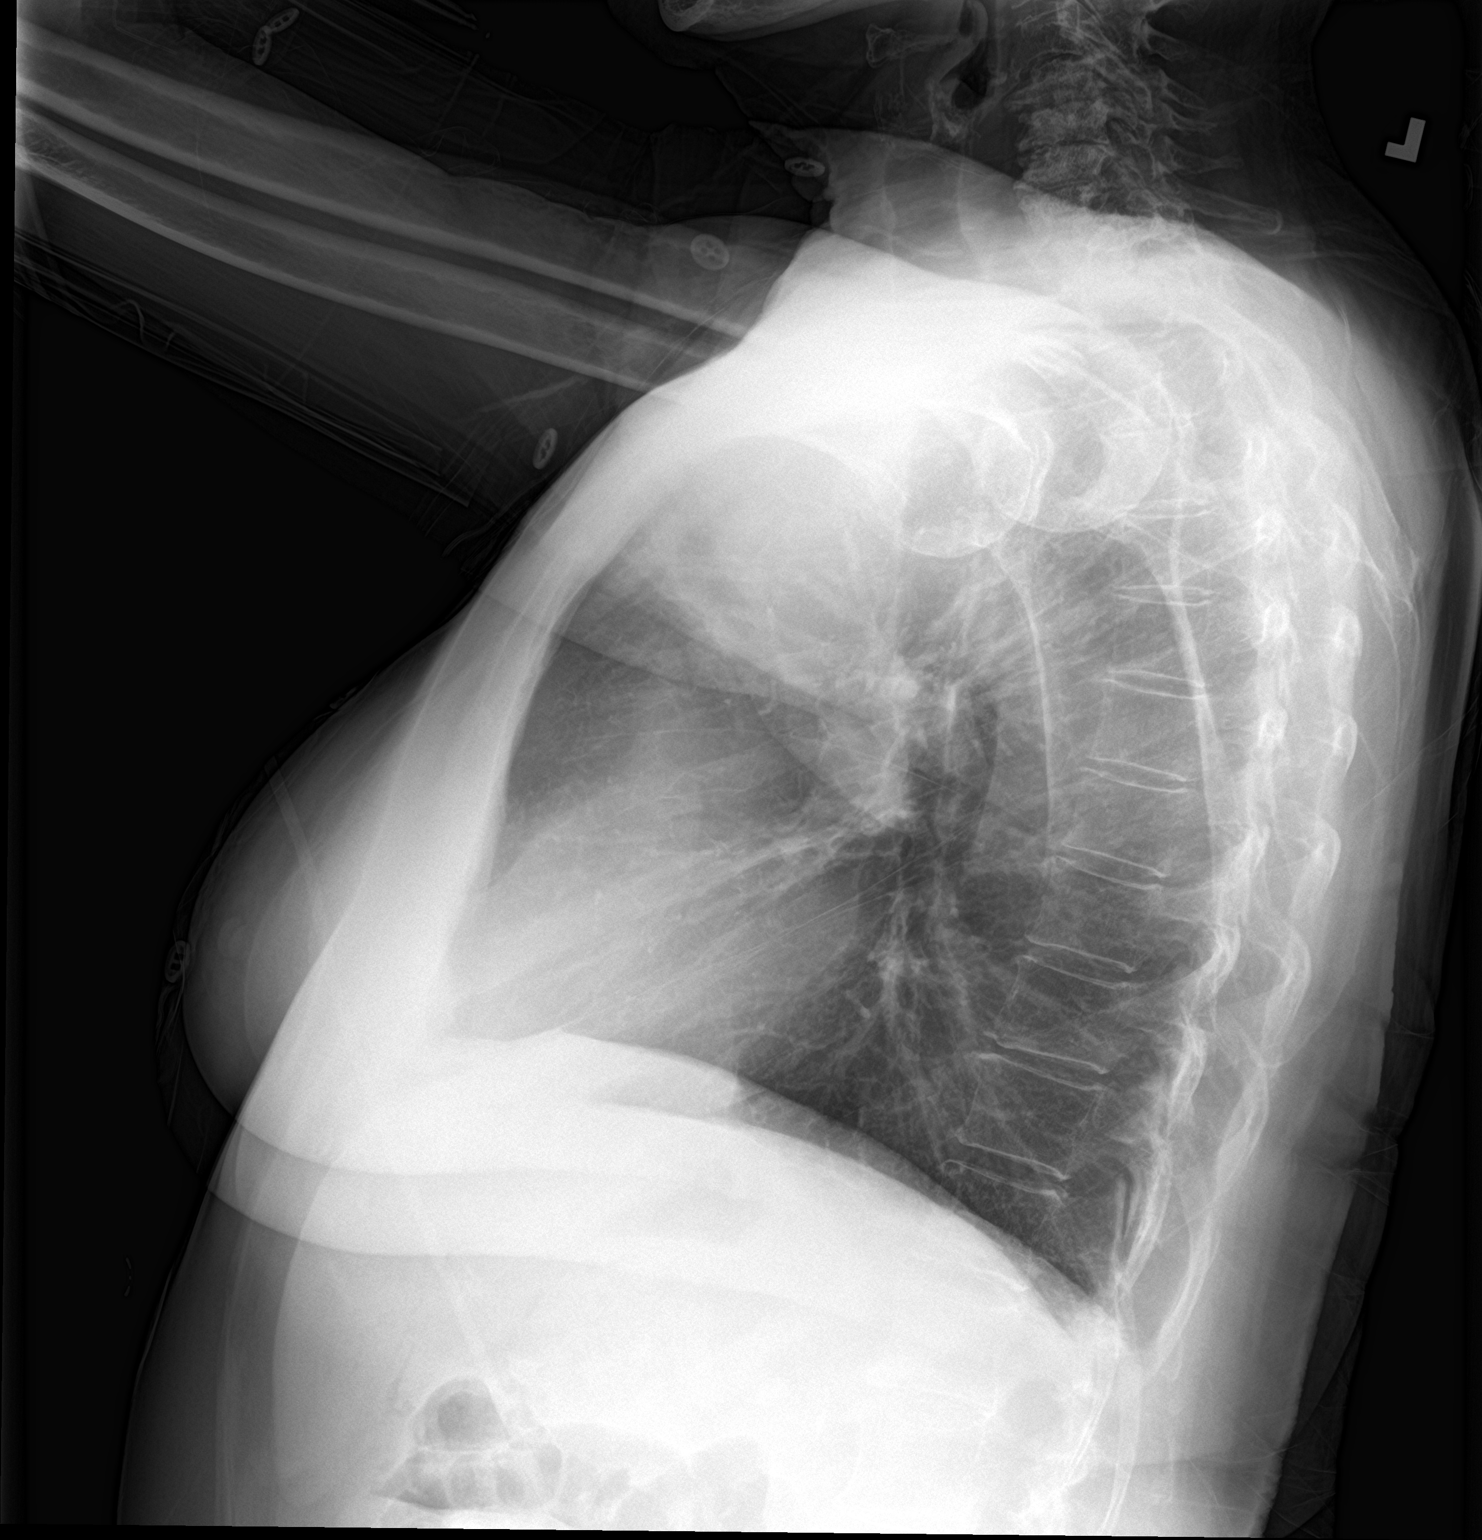

[chest pa]
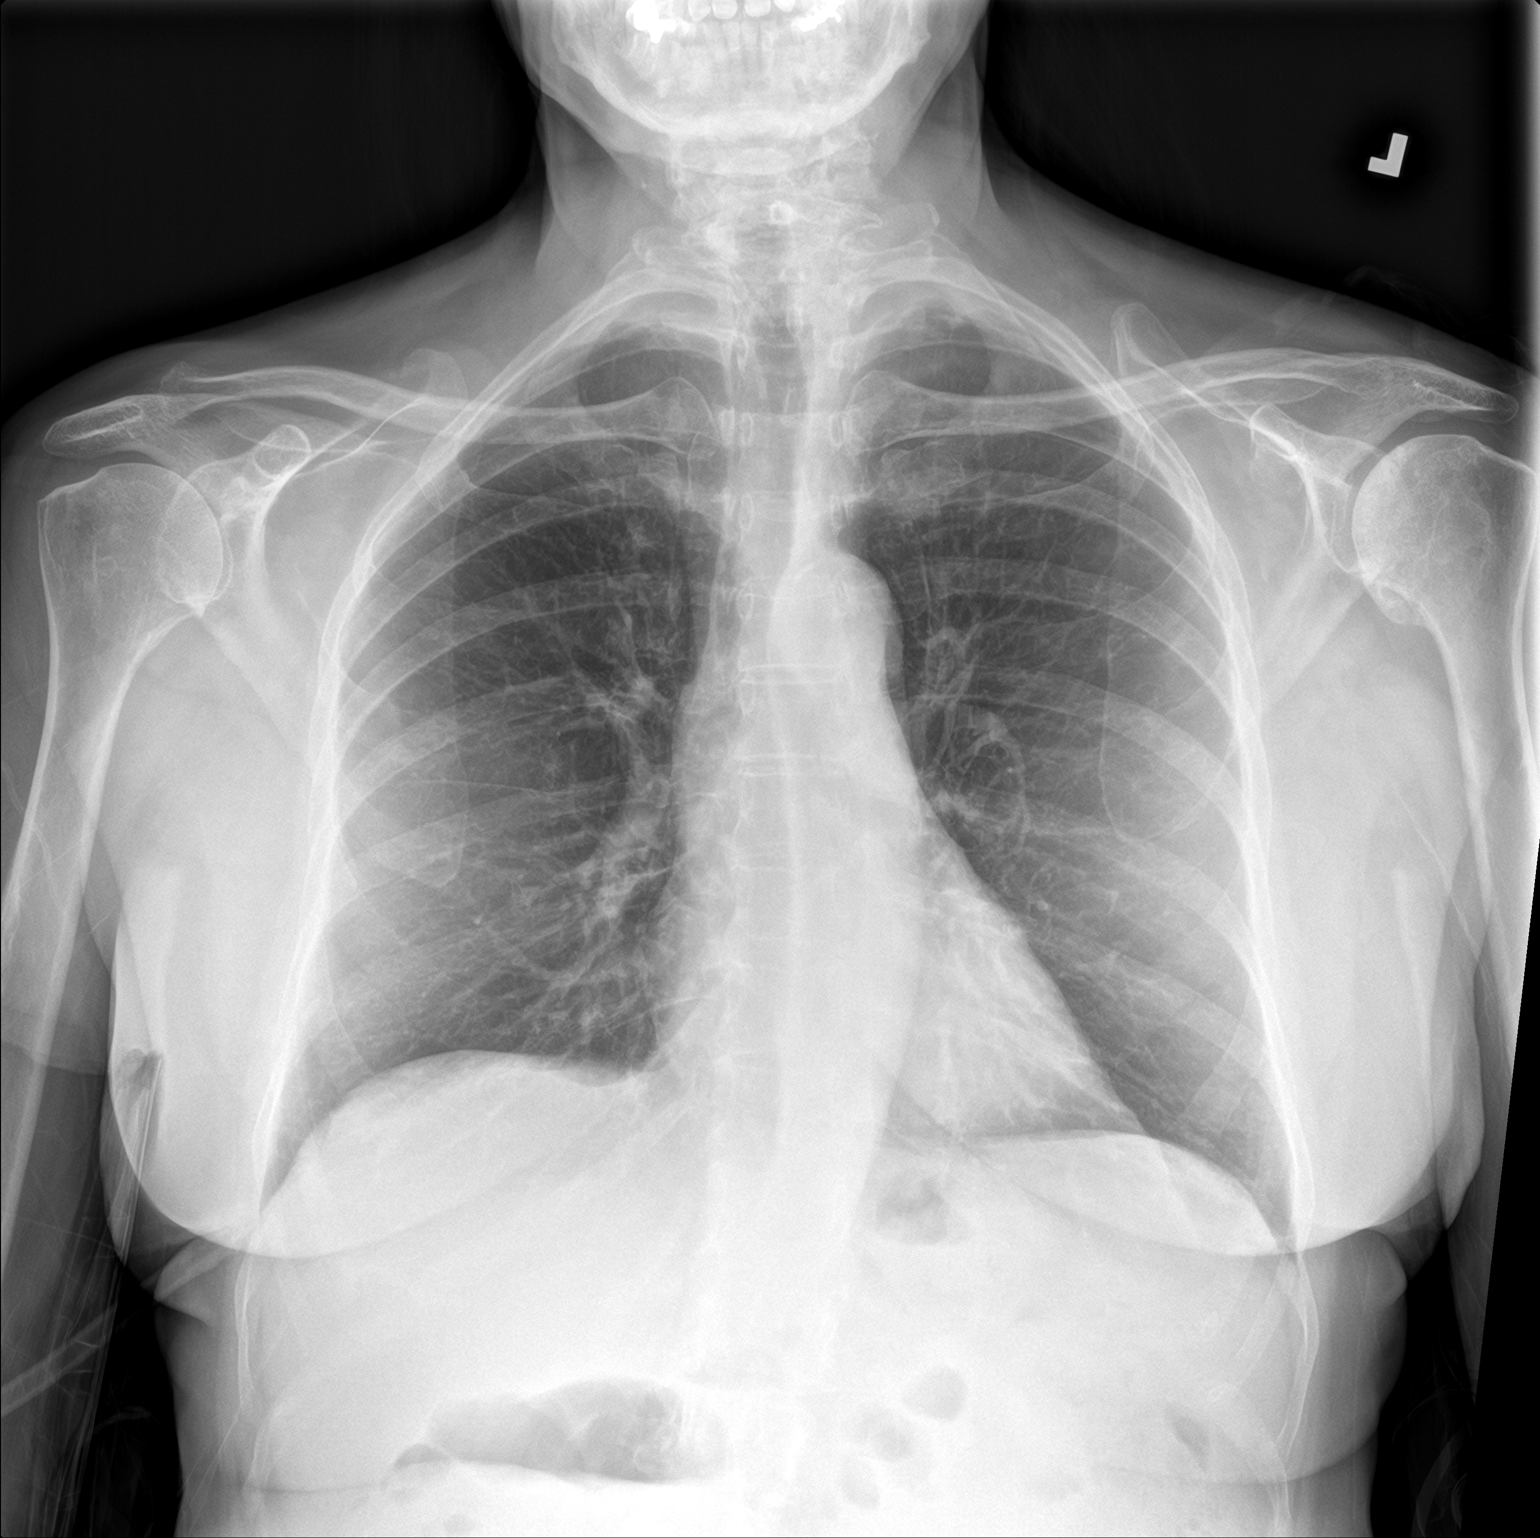

[2 of 2 positions shown; findings below may reference images not displayed]

FINDINGS: The heart size and mediastinal contours are within normal limits.
Both lungs are clear. The visualized skeletal structures are
unremarkable.
IMPRESSION: No active cardiopulmonary disease.

## 2023-04-11 IMAGING — CT CT ABD-PELV W/ CM
2 of 5 series · 15 of 46 positions shown, 17 images · IV contrast (omnipaque)
Comparison: 08/23/2020

CLINICAL DATA: Abdominal distension and elevated white blood cell
count

EXAM:
CT ABDOMEN AND PELVIS WITH CONTRAST
TECHNIQUE: Multidetector CT imaging of the abdomen and pelvis was performed
using the standard protocol following bolus administration of
intravenous contrast.
CONTRAST:  85mL OMNIPAQUE IOHEXOL 350 MG/ML SOLN

[Series 2: axial st · axial · 0.86mm/px · z∈[+968,+1413]mm · 12 of 101 slices shown, 14 images]
[im 6/101  soft-tissue]
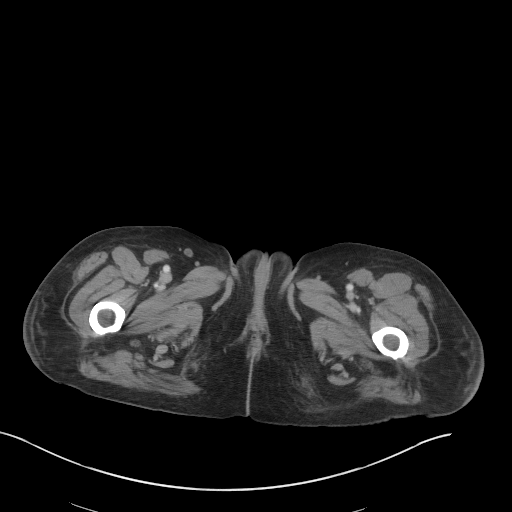
[im 6/101  bone]
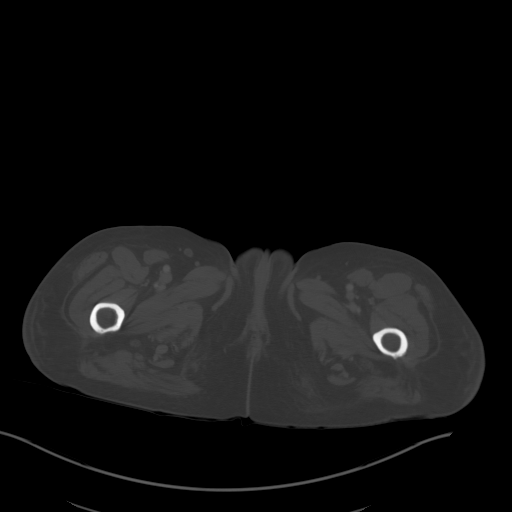
[im 18/101  soft-tissue]
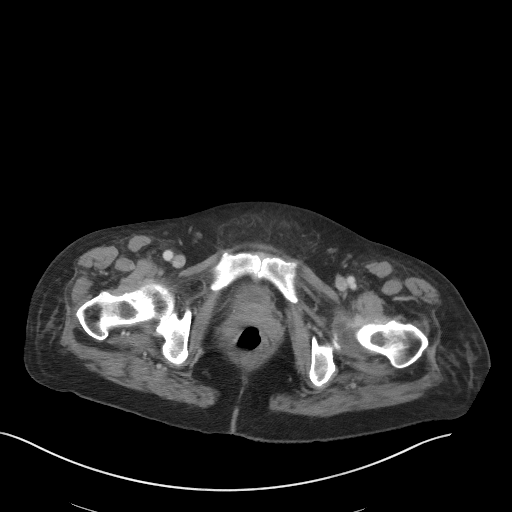
[im 24/101  soft-tissue]
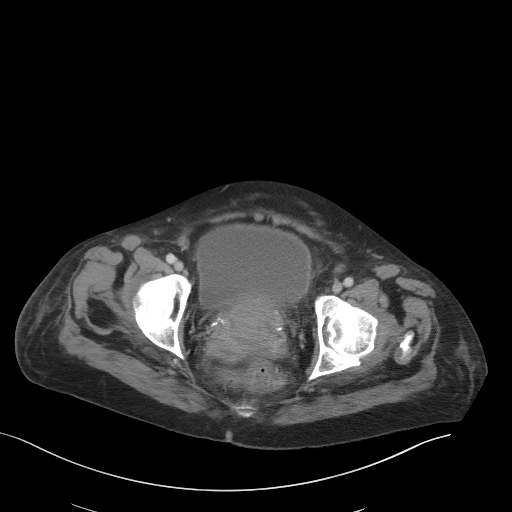
[im 30/101  soft-tissue]
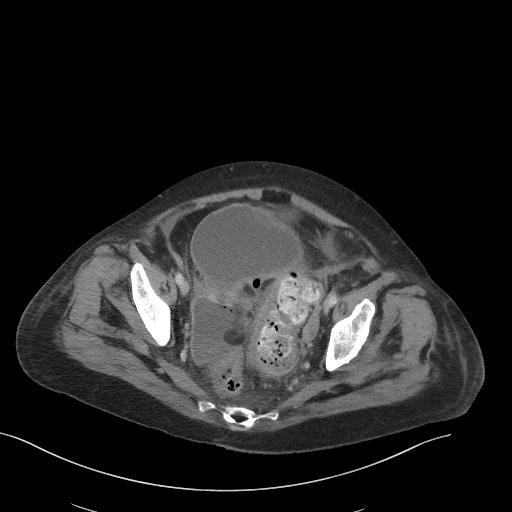
[im 42/101  soft-tissue]
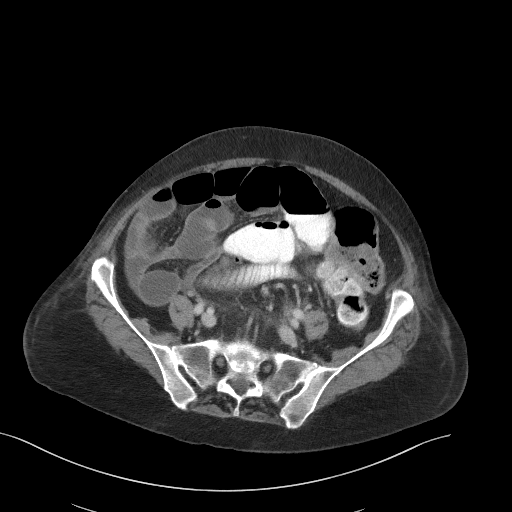
[im 48/101  soft-tissue]
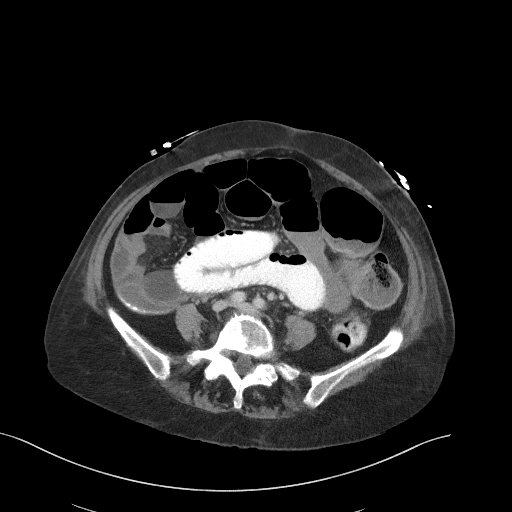
[im 53/101  soft-tissue]
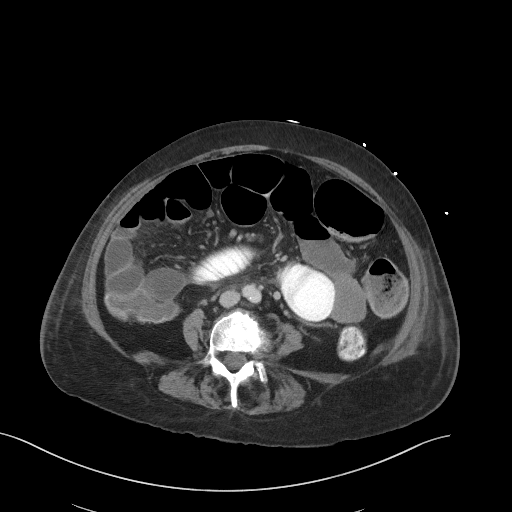
[im 65/101  soft-tissue]
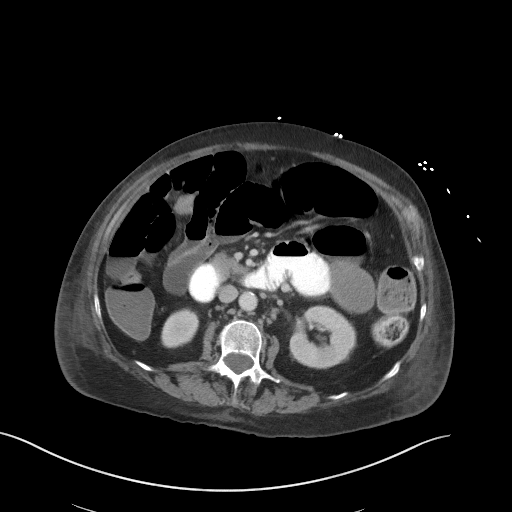
[im 71/101  soft-tissue]
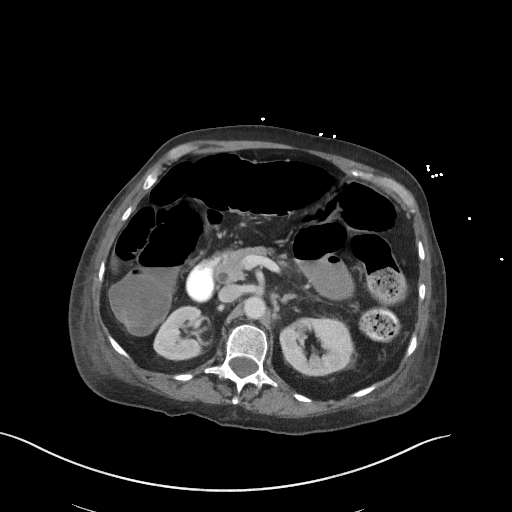
[im 71/101  bone]
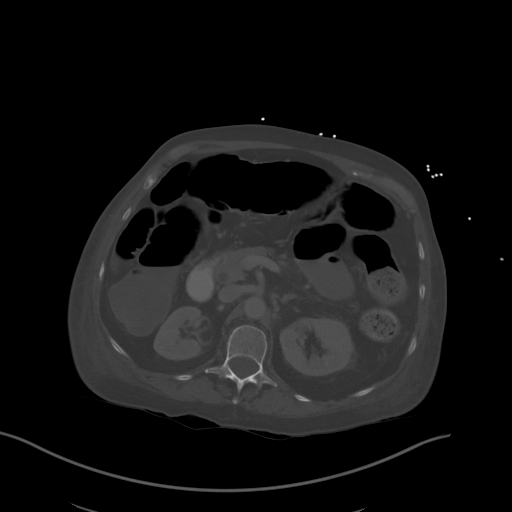
[im 77/101  soft-tissue]
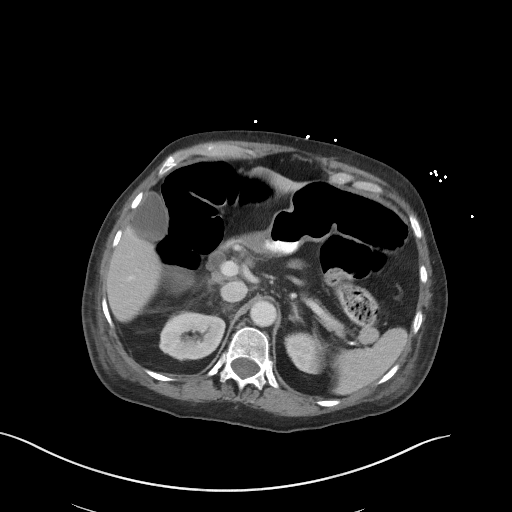
[im 89/101  soft-tissue]
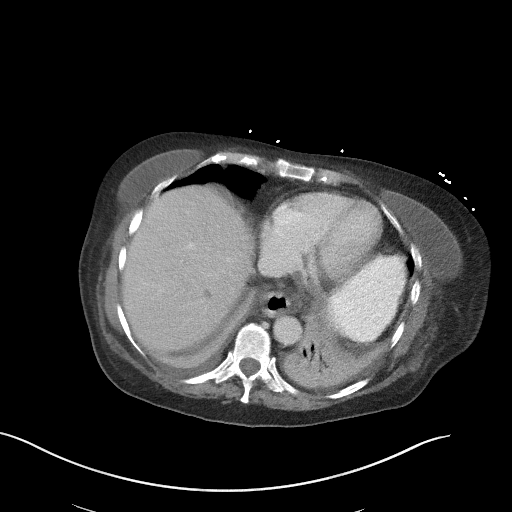
[im 95/101  soft-tissue]
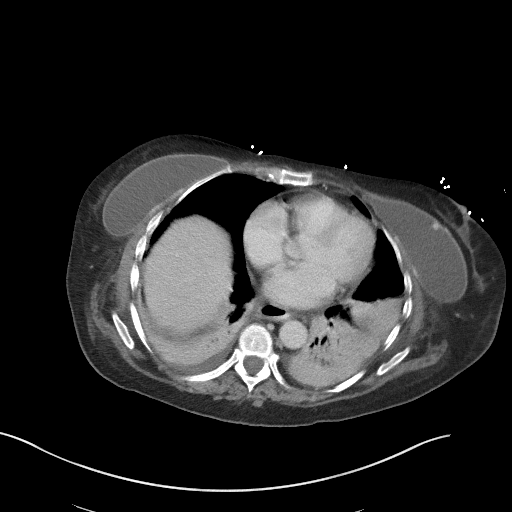

[Series 4: coronal st · coronal · 0.80mm/px · 3 of 100 slices shown]
[im 34/100  soft-tissue]
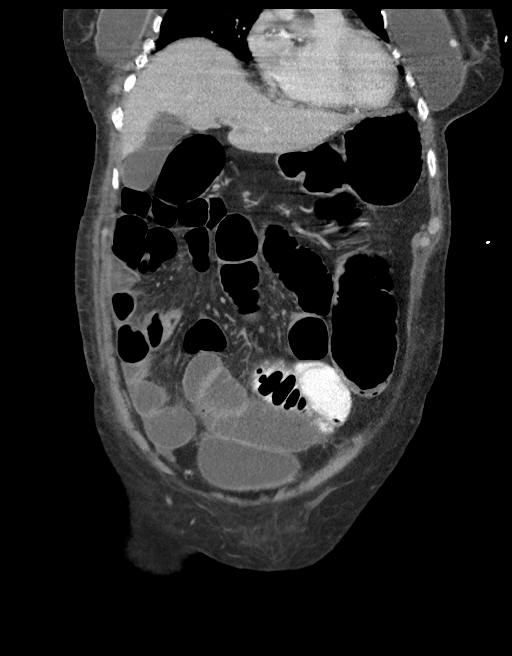
[im 45/100  soft-tissue]
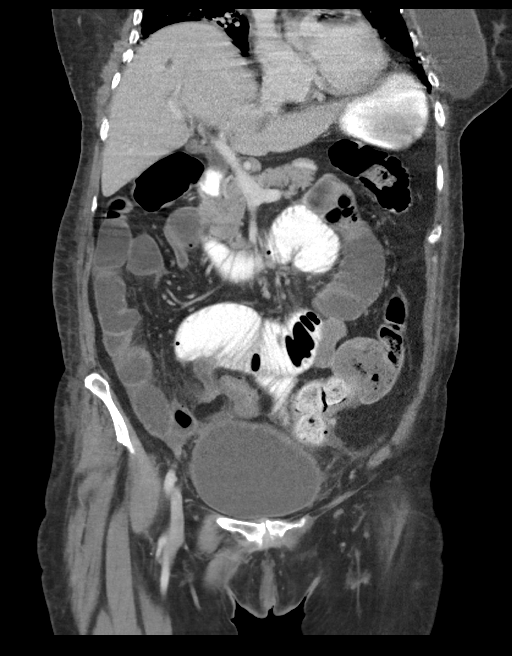
[im 56/100  soft-tissue]
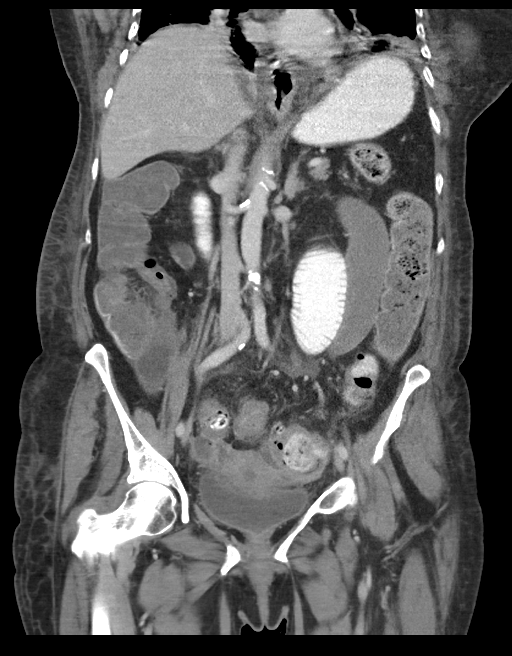

[15 of 46 positions shown; findings below may reference images not displayed]

FINDINGS: Lower chest: Lung bases demonstrate bibasilar consolidation with
small effusions new from the prior exam. Bilateral breast implants
are noted.

Hepatobiliary: Scattered hypodensities are again seen throughout the
liver consistent with cysts. Gallbladder demonstrates some minimal
dependent density likely related to sludge. No definitive calcified
stones are noted. No biliary ductal dilatation is seen.

Pancreas: Unremarkable. No pancreatic ductal dilatation or
surrounding inflammatory changes.

Spleen: Normal in size without focal abnormality.

Adrenals/Urinary Tract: Adrenal glands are within normal limits.
Kidneys demonstrate a normal enhancement pattern bilaterally. Normal
excretion of contrast is seen. A few small cysts are noted. Ureters
are within normal limits. Bladder is partially distended.

Stomach/Bowel: Colons demonstrates scattered diverticular change is
well as some inflammatory change in the region of the sigmoid these
changes are similar to that noted on the prior exam. Stomach is well
distended with contrast material. The small bowel is prominent with
multiple fluid-filled dilated loops of small bowel identified. Some
peristalsis is noted within the small bowel although it appears to
be dilated to the level of mid ileum without discrete transition
zone. Decreased bowel sounds are noted on physical exam in these
changes may represent a generalized small bowel ileus.

Vascular/Lymphatic: Aortic atherosclerosis. No enlarged abdominal or
pelvic lymph nodes.

Reproductive: Uterus and bilateral adnexa are unremarkable.

Other: There is a new air-fluid collection identified in the right
hemipelvis best seen on image number 74 of series 2 this is new from
the prior exam and demonstrates a small amount of air within. This
measures 5.8 x 4.3 cm in greatest dimension. Given the inflammatory
changes and diverticulitis this likely represents a developing
abscess from diverticular rupture. No other focal fluid collection
is noted.

Musculoskeletal: Degenerative changes of the lumbar spine are noted.
IMPRESSION: New air-fluid collection in the right hemipelvis likely representing
a developing abscess. This is most likely related to the underlying
diverticulitis and possible diverticular rupture.

Generalized small bowel dilatation with fluid-filled loops of small
bowel. On clinical exam there are decreased bowel sounds in these
changes may represent a reactive small bowel ileus. Continued
follow-up is recommended.

New bilateral lower lobe consolidation with associated effusions
consistent with atelectasis/early infiltrate.

## 2023-04-13 IMAGING — CT CT IMAGE GUIDED DRAINAGE BY PERCUTANEOUS CATHETER
1 series · 15 of 32 positions shown, 19 images · non-contrast
Comparison: none

INDICATION: 70-year-old female referred for possible CT-guided drainage of
pelvic fluid, in the setting of acute diverticulitis.

EXAM:
CT GUIDED DRAINAGE OF PELVIC   ABSCESS
DISCONTINUATION OF PROCEDURE
MEDICATIONS:
None
ANESTHESIA/SEDATION:
COMPLICATIONS:
TECHNIQUE: Informed written consent was obtained from the patient after a
thorough discussion of the procedural risks, benefits and
alternatives. All questions were addressed. Maximal Sterile Barrier
Technique was utilized including caps, mask, sterile gowns, sterile
gloves, sterile drape, hand hygiene and skin antiseptic. A timeout
was performed prior to the initiation of the procedure.

[Series 2: i-spiral 5.0 b40f · axial · 0.85mm/px · z∈[+779,+1000]mm · 15 of 71 slices shown, 19 images]
[im 5/71  soft-tissue]
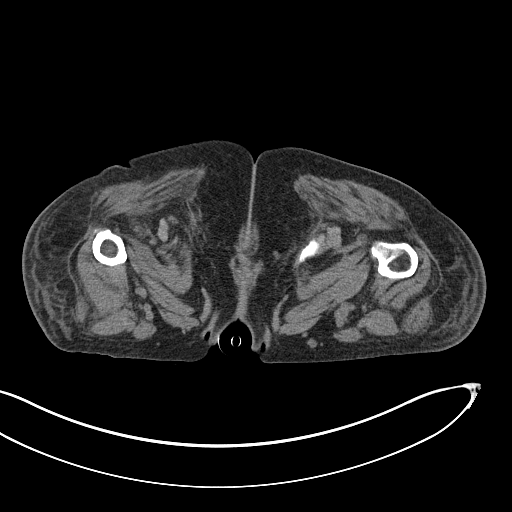
[im 5/71  bone]
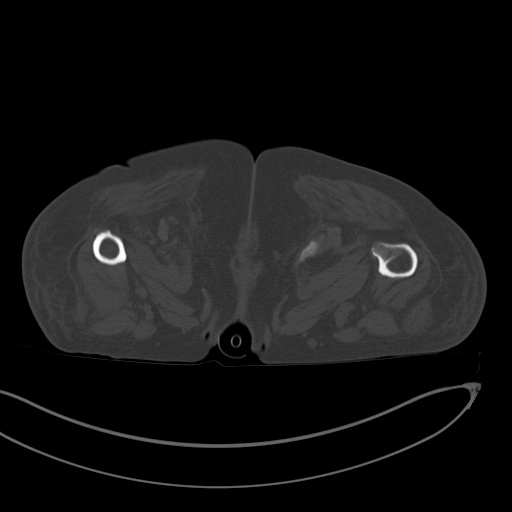
[im 10/71  soft-tissue]
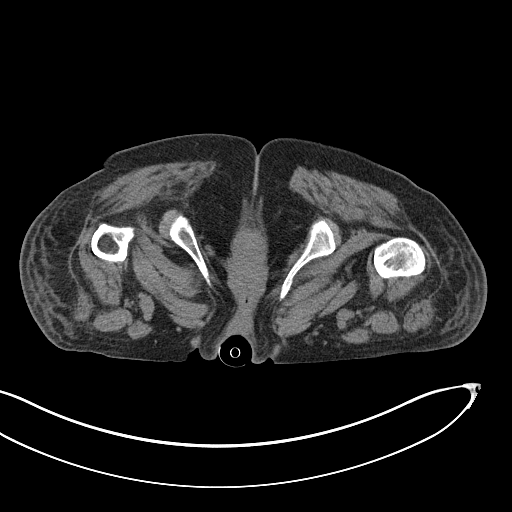
[im 14/71  soft-tissue]
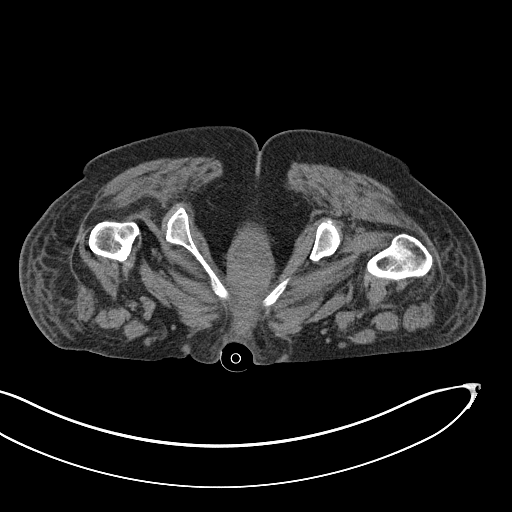
[im 21/71  soft-tissue]
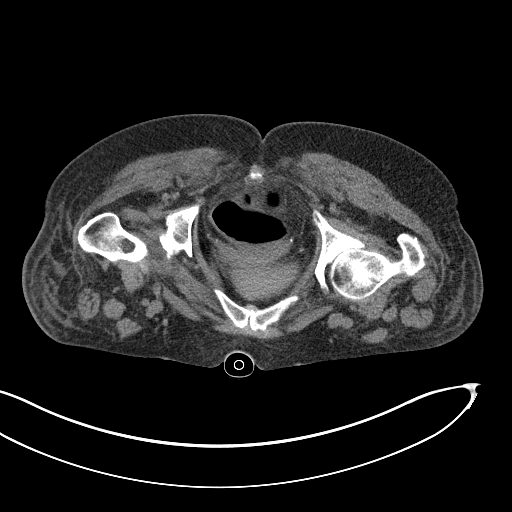
[im 25/71  soft-tissue]
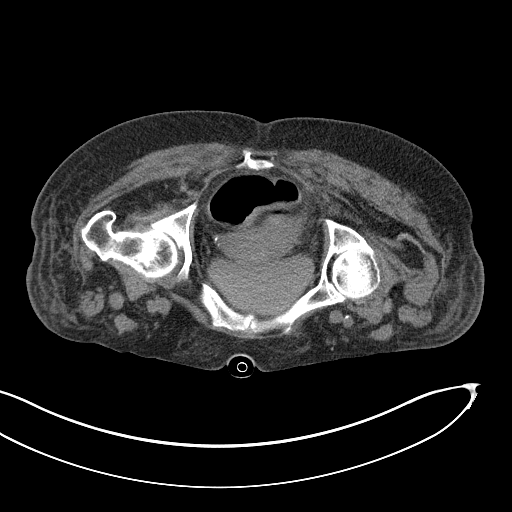
[im 30/71  soft-tissue]
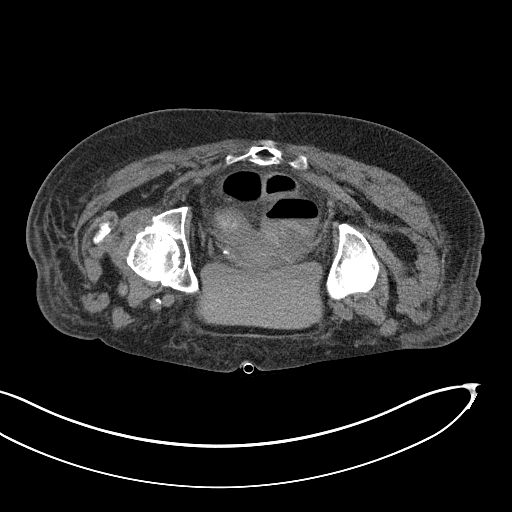
[im 37/71  soft-tissue]
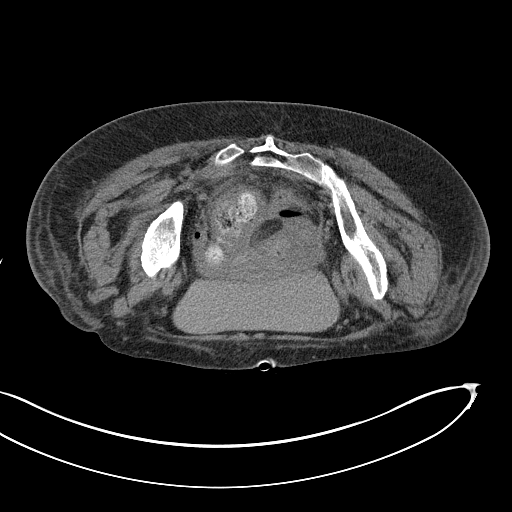
[im 41/71  soft-tissue]
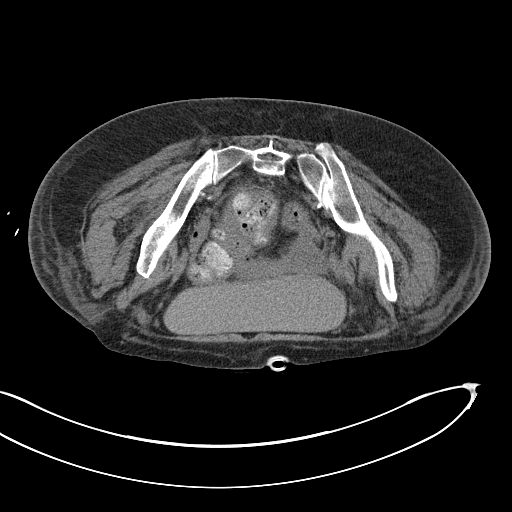
[im 46/71  soft-tissue]
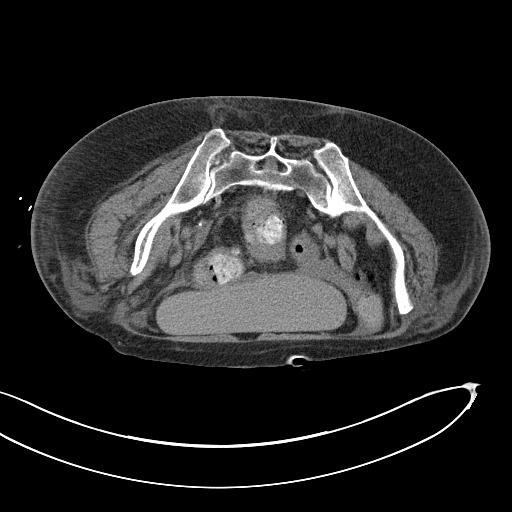
[im 46/71  bone]
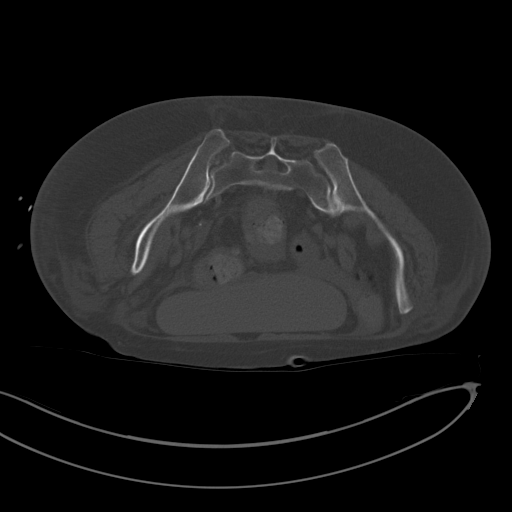
[im 50/71  soft-tissue]
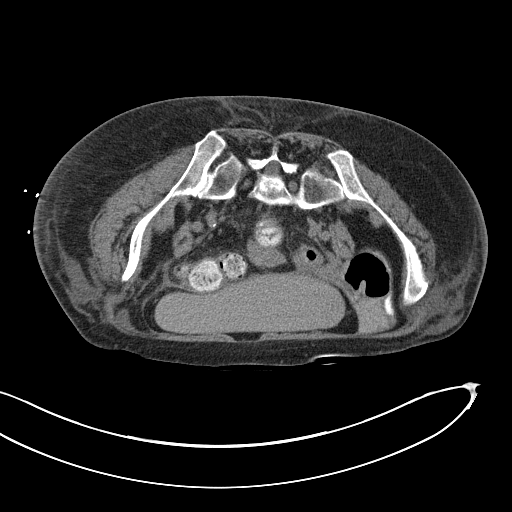
[im 57/71  soft-tissue]
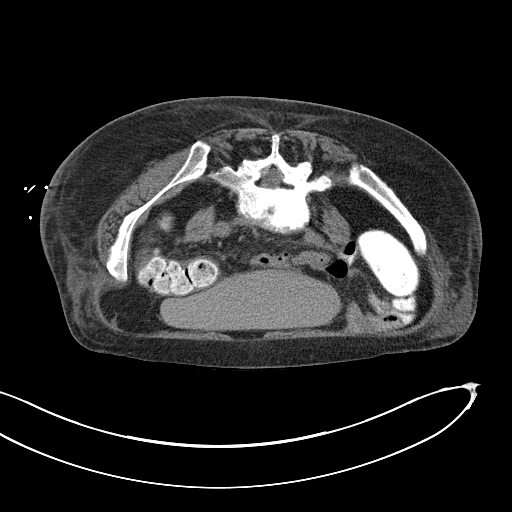
[im 61/71  soft-tissue]
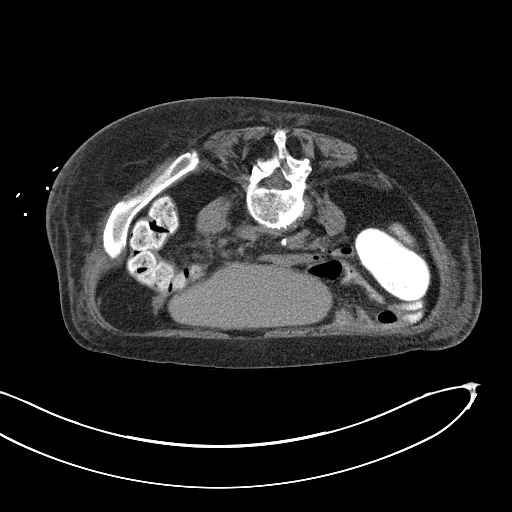
[im 61/71  lung]
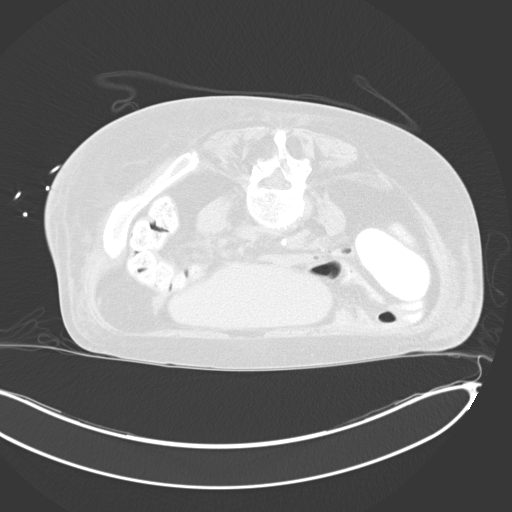
[im 64/71  lung]
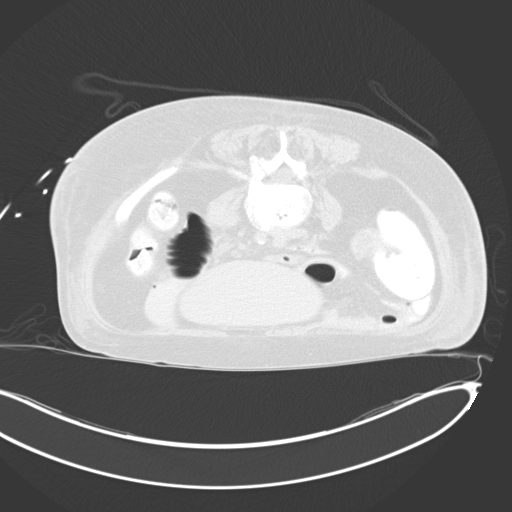
[im 66/71  soft-tissue]
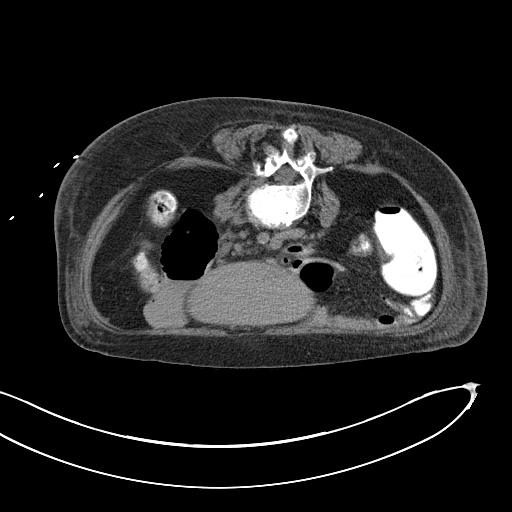
[im 66/71  lung]
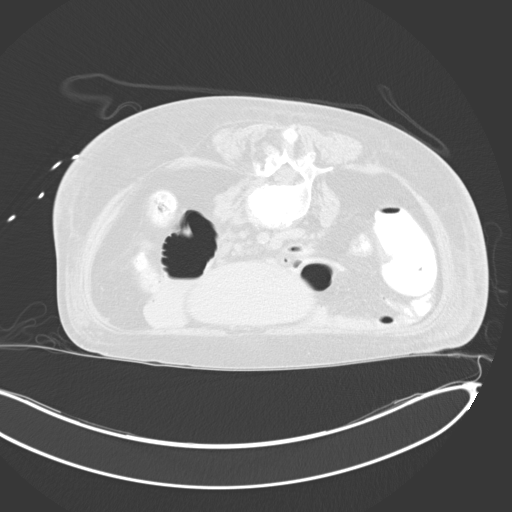
[im 68/71  lung]
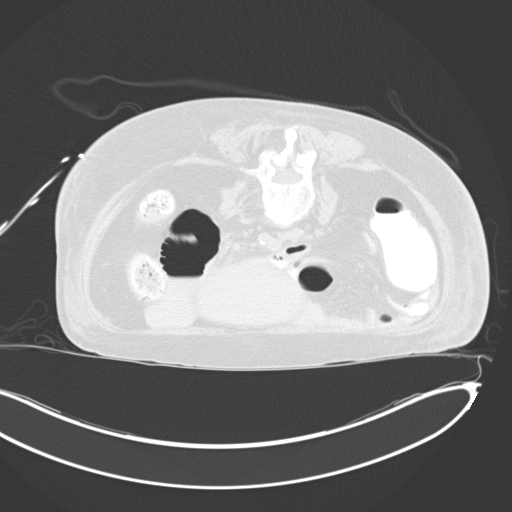

[15 of 32 positions shown; findings below may reference images not displayed]

PROCEDURE:
Patient was position prone position on the CT gantry table for scout
CT.

Scout CT was performed demonstrating there was a decreasing size of
the previous pelvic fluid collection, potentially read distributed
given the scant fluid within the dependent pelvis adjacent to the
urinary bladder.

No target for aspiration/drainage.
FINDINGS: No target for attempted drainage.
IMPRESSION: Prone position CT for attempt at a transgluteal aspiration/drainage
reveals that the fluid collection is improved/read distributed, with
no target for safe percutaneous access. Drainage was deferred.

## 2023-04-15 ENCOUNTER — Ambulatory Visit (HOSPITAL_COMMUNITY)

## 2023-04-16 ENCOUNTER — Ambulatory Visit (HOSPITAL_COMMUNITY)

## 2023-04-25 ENCOUNTER — Other Ambulatory Visit: Payer: Self-pay | Admitting: Orthopedic Surgery

## 2023-04-25 DIAGNOSIS — M51369 Other intervertebral disc degeneration, lumbar region without mention of lumbar back pain or lower extremity pain: Secondary | ICD-10-CM

## 2023-04-25 MED ORDER — TIZANIDINE HCL 4 MG PO TABS
4.0000 mg | ORAL_TABLET | Freq: Three times a day (TID) | ORAL | 0 refills | Status: DC
Start: 2023-04-25 — End: 2023-05-28

## 2023-04-29 ENCOUNTER — Other Ambulatory Visit (INDEPENDENT_AMBULATORY_CARE_PROVIDER_SITE_OTHER): Payer: Self-pay | Admitting: *Deleted

## 2023-04-29 ENCOUNTER — Telehealth (INDEPENDENT_AMBULATORY_CARE_PROVIDER_SITE_OTHER): Payer: Self-pay | Admitting: *Deleted

## 2023-04-29 ENCOUNTER — Other Ambulatory Visit (INDEPENDENT_AMBULATORY_CARE_PROVIDER_SITE_OTHER): Payer: Self-pay | Admitting: Gastroenterology

## 2023-04-29 DIAGNOSIS — K52832 Lymphocytic colitis: Secondary | ICD-10-CM

## 2023-04-29 MED ORDER — BUDESONIDE 3 MG PO CPEP: ORAL_CAPSULE | ORAL | 0 refills | Status: DC

## 2023-04-29 NOTE — Telephone Encounter (Signed)
 Pharmacy called to clarify the quantity of rx sent in. Sent in for #84. They need #210 to have enough for the titration.

## 2023-04-29 NOTE — Telephone Encounter (Signed)
 Script sent to pharmacy with #[phone]

## 2023-04-29 NOTE — Telephone Encounter (Signed)
 Not sure if you want this patient to have refills. They should have enough with #84 if taking 3 daily for 14 days ( 42 tablets ) and one daily for 14 days ( 14 tablets ) or did you want them to continue med?

## 2023-05-21 ENCOUNTER — Ambulatory Visit: Admitting: Gastroenterology

## 2023-05-21 ENCOUNTER — Other Ambulatory Visit: Payer: Self-pay | Admitting: Internal Medicine

## 2023-05-21 DIAGNOSIS — E782 Mixed hyperlipidemia: Secondary | ICD-10-CM

## 2023-05-26 ENCOUNTER — Other Ambulatory Visit: Payer: Self-pay | Admitting: Orthopedic Surgery

## 2023-05-26 DIAGNOSIS — M51369 Other intervertebral disc degeneration, lumbar region without mention of lumbar back pain or lower extremity pain: Secondary | ICD-10-CM

## 2023-06-03 ENCOUNTER — Ambulatory Visit: Admitting: Gastroenterology

## 2023-06-03 ENCOUNTER — Encounter: Payer: Self-pay | Admitting: Gastroenterology

## 2023-06-03 VITALS — BP 137/77 | HR 70 | Temp 97.6°F | Ht 66.0 in | Wt 131.2 lb

## 2023-06-03 DIAGNOSIS — Z8719 Personal history of other diseases of the digestive system: Secondary | ICD-10-CM

## 2023-06-03 DIAGNOSIS — R197 Diarrhea, unspecified: Secondary | ICD-10-CM

## 2023-06-03 DIAGNOSIS — K52832 Lymphocytic colitis: Secondary | ICD-10-CM | POA: Diagnosis not present

## 2023-06-03 NOTE — Progress Notes (Signed)
 GI Office Note    Referring Provider: Meldon Sport, MD Primary Care Physician:  Meldon Sport, MD Primary Gastroenterologist: Albertus Hughs, MD  Date:  06/03/2023  ID:  Zhavia Cunanan, DOB [birth year], MRN [id number]   Chief Complaint   Chief Complaint  Patient presents with   Follow-up    Follow up. No problems    History of Present Illness  Johnika Escareno is a 73 y.o. female with a history of complicated diverticulitis s/p Hartman's with colostomy 2022 presenting today for follow-up post colonoscopy.  Colonoscopy with [facility] in 2023: - Normal mucosa in entire examined colon - TI normal - Mildly ulcerated stoma  Last office visit 03/18/23 by Dr. Alita Irwin with complaint of altered bowel movements and abdominal discomfort.  Reported change in bowel movements in January 25 with abdominal pain.  She was given Cipro  and Flagyl  but had persistent symptoms.  She was reporting 2-4 liquid bowel movements daily with nocturnal stools.  Having lower abdominal cramping and discomfort unrelated to food.  Had not had any of the symptoms since surgery 2 years ago.  Denied N/V, fever, GI bleeding, hematemesis, jaundice, Prius, or weight loss.  Advise need for repeat colonoscopy with biopsies to rule out microscopic colitis.  Advised also could be IBS-D or SIBO.  CT A/P ordered given RLQ tenderness.  For diarrhea she was advised to begin bulking agent with Metamucil and IBgard for abdominal discomfort.  Stool studies also ordered.  Colonoscopy 03/26/2023: - Prior side-to-side colocolonic anastomosis in the sigmoid colon which was patent and healthy-appearing - No endoscopic evidence of inflammation or polyps s/p random biopsies - Nonbleeding external and internal hemorrhoids - Random biopsies revealed lymphocytic colitis - Repeat colonoscopy in 10 years if medically fit. - Started on oral budesonide  9 mg daily for 6-8 weeks and advised a taper.  Stool studies not performed.  Today:  IBGard  and metamucil she is doing well and going regularly daily.  IBgard is definitely helping with her prior abdominal discomfort.  No longer having nocturnal stools. . She is currently doing the Entocort taper and down to 2 tablets daily currently.  She states she has about the whole 2 weeks of this left and then will have 1 tablet daily for 2 weeks.  Denies any melena or brbpr.  Not having any upper GI symptoms at this time.  Takes 5 tablets daily of metamucil with 2 glasses of water .  Taking 2 tablets once daily of IBgard.  Wt Readings from Last 3 Encounters:  06/03/23 131 lb 3.2 oz (59.5 kg)  03/26/23 125 lb (56.7 kg)  03/18/23 130 lb (59 kg)    Current Outpatient Medications  Medication Sig Dispense Refill   amLODipine  (NORVASC ) 10 MG tablet Take 1 tablet (10 mg total) by mouth daily. 90 tablet 0   budesonide  (ENTOCORT EC ) 3 MG 24 hr capsule TAKE 3 CAPSULES BY MOUTH FOR 56 DAYS, THEN DECREASE TO TWO CAPSULE BY MOUTH FOR 14 DAYS, THEN DECREASE TO ONE CAPSULE BY MOUTH FOR 14 DAYS 210 capsule 0   Camphor-Menthol -Methyl Sal (SALONPAS) 3.01-20-08 % PTCH Place 1 patch onto the skin daily as needed (pain).     gabapentin  (NEURONTIN ) 100 MG capsule TAKE 1 CAPSULE BY MOUTH EVERY 8 HOURS AS NEEDED 90 capsule 5   Multiple Vitamin (MULTIVITAMIN WITH MINERALS) TABS tablet Take 1 tablet by mouth daily.     Nebivolol  HCl 20 MG TABS Take 1 tablet (20 mg total) by mouth daily. 90 tablet 0  psyllium (METAMUCIL) 58.6 % packet Take 1 packet by mouth 2 (two) times daily. 60 packet 2   rosuvastatin  (CRESTOR ) 10 MG tablet Take 1 tablet by mouth once daily 90 tablet 0   tiZANidine  (ZANAFLEX ) 4 MG tablet TAKE 1 TABLET BY MOUTH THREE TIMES DAILY 60 tablet 0   VITAMIN D  PO Take by mouth.     No current facility-administered medications for this visit.    Past Medical History:  Diagnosis Date   Acute diverticulitis 08/23/2020   Anemia    Colostomy in place [facility]) 09/21/2020   COVID-19 virus infection ---- +ve on  09/01/2020 09/10/2020   Essential hypertension, benign 10/01/2018   HLD (hyperlipidemia) 10/01/2018   Osteoarthritis 10/01/2018   Post-op intraabdominal/Liver abscess-Had Hartmann's with Colostomy on 08/29/2020 for Sigmoid Diverticulits 09/10/2020   s/p ex lap and Hartman's procedure with colostomy by AR 08/29/20 for diverticulitis with abscess-- Now with Liver/intrabdominal abscess---   Sepsis Due to Intraabdomonal Abscess 09/10/2020   Vitamin D  deficiency disease 10/01/2018    Past Surgical History:  Procedure Laterality Date   COLON RESECTION N/A 08/29/2020   Procedure: COLON RESECTION;  Surgeon: Shela Derby, MD;  Location: [facility] OR;  Service: General;  Laterality: N/A;   COLONOSCOPY N/A 03/26/2023   Procedure: COLONOSCOPY;  Surgeon: Hargis Lias, MD;  Location: [facility];  Service: Endoscopy;  Laterality: N/A;  1:30PM;ASA 1-2   COLOSTOMY N/A 08/29/2020   Procedure: COLOSTOMY;  Surgeon: Shela Derby, MD;  Location: [facility] OR;  Service: General;  Laterality: N/A;   LAPAROTOMY N/A 08/29/2020   Procedure: EXPLORATORY LAPAROTOMY;  Surgeon: Shela Derby, MD;  Location: [facility] OR;  Service: General;  Laterality: N/A;   LYSIS OF ADHESION N/A 04/25/2021   Procedure: LYSIS OF ADHESION;  Surgeon: Melvenia Stabs, MD;  Location: [facility] ORS;  Service: General;  Laterality: N/A;   TONSILLECTOMY Bilateral    TOTAL HIP ARTHROPLASTY Left 06/29/2021   Procedure: LEFT TOTAL HIP ARTHROPLASTY ANTERIOR APPROACH;  Surgeon: Arnie Lao, MD;  Location: [facility] ORS;  Service: Orthopedics;  Laterality: Left;   XI ROBOTIC ASSISTED COLOSTOMY TAKEDOWN N/A 04/25/2021   Procedure: XI ROBOTIC ASSISTED COLOSTOMY TAKEDOWN WITH LOW ANTERIOR RESECTION, WITH BILATERAL TAP BLOCK, FLEXIBLE SIGMOIDOSCOPY, AND INTRAOPERATIVE ASSESSMENT OF PERFUSION USING FIREFLY;  Surgeon: Melvenia Stabs, MD;  Location: [facility] ORS;  Service: General;  Laterality: N/A;    Family History  Problem Relation Age of Onset    Hypertension Mother    Stroke Mother    Stroke Father    Hypertension Brother    Hypertension Son     Allergies as of 06/03/2023   (No Known Allergies)    Social History   Socioeconomic History   Marital status: Married    Spouse name: Landon Pinion   Number of children: 2   Years of education: Not on file   Highest education level: Bachelor's degree (e.g., BA, AB, BS)  Occupational History   Not on file  Tobacco Use   Smoking status: Never   Smokeless tobacco: Never  Vaping Use   Vaping status: Never Used  Substance and Sexual Activity   Alcohol use: Not Currently    Alcohol/week: 7.0 standard drinks of alcohol    Types: 7 Glasses of wine per week    Comment: occasional wine   Drug use: Never   Sexual activity: Not on file  Other Topics Concern   Not on file  Social History Narrative   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   Social Drivers  of Health   Financial Resource Strain: Low Risk  (01/16/2023)   Overall Financial Resource Strain (CARDIA)    Difficulty of Paying Living Expenses: Not hard at all  Food Insecurity: No Food Insecurity (01/16/2023)   Hunger Vital Sign    Worried About Running Out of Food in the Last Year: Never true    Ran Out of Food in the Last Year: Never true  Transportation Needs: No Transportation Needs (01/16/2023)   PRAPARE - Administrator, Civil Service (Medical): No    Lack of Transportation (Non-Medical): No  Physical Activity: Sufficiently Active (01/16/2023)   Exercise Vital Sign    Days of Exercise per Week: 5 days    Minutes of Exercise per Session: 30 min  Stress: No Stress Concern Present (01/16/2023)   [employer] of Occupational Health - Occupational Stress Questionnaire    Feeling of Stress : Only a little  Recent Concern: Stress - Stress Concern Present (12/14/2022)   [employer] of Occupational Health - Occupational Stress Questionnaire    Feeling of Stress : Very much  Social Connections:  Moderately Integrated (01/16/2023)   Social Connection and Isolation Panel [NHANES]    Frequency of Communication with Friends and Family: More than three times a week    Frequency of Social Gatherings with Friends and Family: More than three times a week    Attends Religious Services: Never    Database administrator or Organizations: Yes    Attends Banker Meetings: 1 to 4 times per year    Marital Status: Married     Review of Systems   Gen: Denies fever, chills, anorexia. Denies fatigue, weakness, weight loss.  CV: Denies chest pain, palpitations, syncope, peripheral edema, and claudication. Resp: Denies dyspnea at rest, cough, wheezing, coughing up blood, and pleurisy. GI: See HPI Derm: Denies rash, itching, dry skin Psych: Denies depression, anxiety, memory loss, confusion. No homicidal or suicidal ideation.  Heme: Denies bruising, bleeding, and enlarged lymph nodes.  Physical Exam   BP 137/77 (BP Location: Right Arm, Patient Position: Sitting, Cuff Size: Normal)   Pulse 70   Temp 97.6 F (36.4 C) (Temporal)   Ht 5\' 6"  (1.676 m)   Wt 131 lb 3.2 oz (59.5 kg)   BMI 21.18 kg/m   General:   Alert and oriented. No distress noted. Pleasant and cooperative.  Head:  Normocephalic and atraumatic. Eyes:  Conjuctiva clear without scleral icterus. Mouth:  Oral mucosa pink and moist. Good dentition. No lesions. Abdomen:  +BS, soft, non-tender and non-distended.  Hernia present to mid right abdomen, unable to visualize however is mildly palpable  (site of prior ostomy), abdominal binder in place.  No rebound or guarding. No HSM or masses noted. Rectal: deferred Msk:  Symmetrical without gross deformities. Normal posture. Extremities:  Without edema. Neurologic:  Alert and  oriented x4 Psych:  Alert and cooperative. Normal mood and affect.  Assessment  Estefanie Cornforth is a 73 y.o. female with a history of complicated diverticulitis s/p Hartman's with colostomy 2022 presenting  today for follow-up post colonoscopy.  Diarrhea, Lymphocytic colitis, history of diverticulitis: Previously referred for evaluation however since January was experiencing multiple episodes of diarrhea daily along with nocturnal stools.  Recent colonoscopy in March with normal-appearing mucosa however random colon biopsies revealed lymphocytic colitis.  She was started on oral budesonide  taper and states she immediately began having more formed stool and no longer having nocturnal stools.  She is also continue to take Metamucil  tablet twice daily with a total of 5 in a day as well as taking IBgard for abdominal discomfort.  This is completely relieved all of her symptoms, she just recently started her taper to 6 mg daily for 2 weeks and has not had any recurrence of diarrhea.  We did discuss the possibility of recurrence and an unknown timeframe and she is to let me know if she begins to experience extreme diarrhea again that last more than a couple of days as we likely would first recommend stool studies.  Provided some additional education today regarding lymphocytic colitis and was given to her in written form.  For now we will continue her current regimen and complete taper and if doing well she can follow-up as needed although we will tentatively plan for follow-up in 2 months to see how she is doing after taper.  PLAN   Continue Entocort taper. Call if recurrent diarrhea post-treatment. Continue Metamucil twice daily and IBGard daily.  Separate written education regarding lymphocytic colitis provided. Stool studies if diarrhea lasting longer than 48 hours with bland diet. Tentative follow-up in 2 months, if doing well can follow-up as needed.   Julian Obey, MSN, FNP-BC, AGACNP-BC [facility]

## 2023-06-03 NOTE — Patient Instructions (Signed)
 Continue your steroid taper.  Monitor for recurrent diarrhea or nocturnal stools and notify the office if this occurs.  Please also notify me if you begin to have any fevers or if your diarrhea persists more than 48 to 72 hours despite bland diet as I would recommend some stool studies at that time.  Continue Metamucil and IBgard given this is working well.  I provided some separate written education for you regarding lymphocytic colitis to keep for your reference.  We will send a letter in about 2 months for follow-up, if you are doing well you can follow-up as needed or if you are having some concerning symptoms or additional questions we will be happy to see you.  It was a pleasure to see you today. I want to create trusting relationships with patients. If you receive a survey regarding your visit,  I greatly appreciate you taking time to fill this out on paper or through your MyChart. I value your feedback.  Julian Obey, MSN, FNP-BC, AGACNP-BC [facility]

## 2023-06-16 ENCOUNTER — Encounter: Payer: Self-pay | Admitting: *Deleted

## 2023-06-18 ENCOUNTER — Other Ambulatory Visit: Payer: Self-pay | Admitting: Internal Medicine

## 2023-06-18 DIAGNOSIS — I10 Essential (primary) hypertension: Secondary | ICD-10-CM

## 2023-06-24 ENCOUNTER — Other Ambulatory Visit: Payer: Self-pay | Admitting: Orthopedic Surgery

## 2023-06-24 ENCOUNTER — Encounter: Payer: Self-pay | Admitting: Gastroenterology

## 2023-06-24 ENCOUNTER — Ambulatory Visit: Payer: Medicare PPO | Admitting: Internal Medicine

## 2023-06-24 DIAGNOSIS — M51369 Other intervertebral disc degeneration, lumbar region without mention of lumbar back pain or lower extremity pain: Secondary | ICD-10-CM

## 2023-07-23 ENCOUNTER — Other Ambulatory Visit: Payer: Self-pay | Admitting: Orthopedic Surgery

## 2023-07-28 ENCOUNTER — Ambulatory Visit: Admitting: Internal Medicine

## 2023-07-28 ENCOUNTER — Encounter: Payer: Self-pay | Admitting: Internal Medicine

## 2023-07-28 VITALS — BP 138/84 | HR 79 | Ht 66.0 in | Wt 130.0 lb

## 2023-07-28 DIAGNOSIS — M51362 Other intervertebral disc degeneration, lumbar region with discogenic back pain and lower extremity pain: Secondary | ICD-10-CM | POA: Diagnosis not present

## 2023-07-28 DIAGNOSIS — I10 Essential (primary) hypertension: Secondary | ICD-10-CM

## 2023-07-28 DIAGNOSIS — K52832 Lymphocytic colitis: Secondary | ICD-10-CM | POA: Diagnosis not present

## 2023-07-28 DIAGNOSIS — R739 Hyperglycemia, unspecified: Secondary | ICD-10-CM

## 2023-07-28 DIAGNOSIS — E782 Mixed hyperlipidemia: Secondary | ICD-10-CM

## 2023-07-28 DIAGNOSIS — E559 Vitamin D deficiency, unspecified: Secondary | ICD-10-CM

## 2023-07-28 NOTE — Patient Instructions (Addendum)
 Please continue to take medications as prescribed.  Please continue to follow low salt diet and perform moderate exercise/walking at least 150 mins/week.  Please get fasting blood tests done before the next visit.

## 2023-07-28 NOTE — Assessment & Plan Note (Addendum)
 Last lipid profile reviewed On Crestor  -LDL improved compared to prior, if persistently elevated LDL, will increase dose of Crestor  Advised to follow DASH diet for now

## 2023-07-28 NOTE — Progress Notes (Signed)
 Established Patient Office Visit  Subjective:  Patient ID: Anne Aguirre, female    DOB: [birth year]  Age: 73 y.o. MRN: [id number]  CC:  Chief Complaint  Patient presents with   Medical Management of Chronic Issues    6 month f/u    HPI Anne Aguirre is a 73 y.o. female with past medical history of HTN, diverticulitis s/p colon resection and hip arthritis who presents for f/u of her chronic medical conditions.  HTN: BP is well-controlled. Takes medications regularly. Patient denies headache, dizziness, chest pain, dyspnea or palpitations.  She had left [facility] in 06/23.  She denies any left hip pain currently.  She has chronic low back pain, for which she takes gabapentin  and has completed PT. She takes Zanaflex  PRN as well.   She has been taking Crestor  for HLD as well. Her LDL was still elevated in the last visit.  Past Medical History:  Diagnosis Date   Acute diverticulitis 08/23/2020   Anemia    Colostomy in place [facility]) 09/21/2020   COVID-19 virus infection ---- +ve on 09/01/2020 09/10/2020   Essential hypertension, benign 10/01/2018   HLD (hyperlipidemia) 10/01/2018   Osteoarthritis 10/01/2018   Post-op intraabdominal/Liver abscess-Had Hartmann's with Colostomy on 08/29/2020 for Sigmoid Diverticulits 09/10/2020   s/p ex lap and Hartman's procedure with colostomy by AR 08/29/20 for diverticulitis with abscess-- Now with Liver/intrabdominal abscess---   Sepsis Due to Intraabdomonal Abscess 09/10/2020   Vitamin D  deficiency disease 10/01/2018    Past Surgical History:  Procedure Laterality Date   COLON RESECTION N/A 08/29/2020   Procedure: COLON RESECTION;  Surgeon: Rubin Calamity, MD;  Location: [facility] OR;  Service: General;  Laterality: N/A;   COLONOSCOPY N/A 03/26/2023   Procedure: COLONOSCOPY;  Surgeon: Cinderella Deatrice FALCON, MD;  Location: [facility] SUITE;  Service: Endoscopy;  Laterality: N/A;  1:30PM;ASA 1-2   COLOSTOMY N/A 08/29/2020   Procedure: COLOSTOMY;  Surgeon: Rubin Calamity, MD;  Location: [facility] OR;  Service: General;  Laterality: N/A;   LAPAROTOMY N/A 08/29/2020   Procedure: EXPLORATORY LAPAROTOMY;  Surgeon: Rubin Calamity, MD;  Location: [facility] OR;  Service: General;  Laterality: N/A;   LYSIS OF ADHESION N/A 04/25/2021   Procedure: LYSIS OF ADHESION;  Surgeon: Teresa Lonni HERO, MD;  Location: [facility] ORS;  Service: General;  Laterality: N/A;   TONSILLECTOMY Bilateral    TOTAL HIP ARTHROPLASTY Left 06/29/2021   Procedure: LEFT TOTAL HIP ARTHROPLASTY ANTERIOR APPROACH;  Surgeon: Vernetta Lonni GRADE, MD;  Location: [facility] ORS;  Service: Orthopedics;  Laterality: Left;   XI ROBOTIC ASSISTED COLOSTOMY TAKEDOWN N/A 04/25/2021   Procedure: XI ROBOTIC ASSISTED COLOSTOMY TAKEDOWN WITH LOW ANTERIOR RESECTION, WITH BILATERAL TAP BLOCK, FLEXIBLE SIGMOIDOSCOPY, AND INTRAOPERATIVE ASSESSMENT OF PERFUSION USING FIREFLY;  Surgeon: Teresa Lonni HERO, MD;  Location: [facility] ORS;  Service: General;  Laterality: N/A;    Family History  Problem Relation Age of Onset   Hypertension Mother    Stroke Mother    Stroke Father    Hypertension Brother    Hypertension Son     Social History   Socioeconomic History   Marital status: Married    Spouse name: Elspeth   Number of children: 2   Years of education: Not on file   Highest education level: Bachelor's degree (e.g., BA, AB, BS)  Occupational History   Not on file  Tobacco Use   Smoking status: Never   Smokeless tobacco: Never  Vaping Use   Vaping status: Never Used  Substance and Sexual Activity  Alcohol use: Not Currently    Alcohol/week: 7.0 standard drinks of alcohol    Types: 7 Glasses of wine per week    Comment: occasional wine   Drug use: Never   Sexual activity: Not on file  Other Topics Concern   Not on file  Social History Narrative   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   Social Drivers of Corporate investment banker Strain: Low Risk  (01/16/2023)   Overall Financial Resource Strain  (CARDIA)    Difficulty of Paying Living Expenses: Not hard at all  Food Insecurity: No Food Insecurity (01/16/2023)   Hunger Vital Sign    Worried About Running Out of Food in the Last Year: Never true    Ran Out of Food in the Last Year: Never true  Transportation Needs: No Transportation Needs (01/16/2023)   PRAPARE - Administrator, Civil Service (Medical): No    Lack of Transportation (Non-Medical): No  Physical Activity: Sufficiently Active (01/16/2023)   Exercise Vital Sign    Days of Exercise per Week: 5 days    Minutes of Exercise per Session: 30 min  Stress: No Stress Concern Present (01/16/2023)   [employer] of Occupational Health - Occupational Stress Questionnaire    Feeling of Stress : Only a little  Recent Concern: Stress - Stress Concern Present (12/14/2022)   [employer] of Occupational Health - Occupational Stress Questionnaire    Feeling of Stress : Very much  Social Connections: Moderately Integrated (01/16/2023)   Social Connection and Isolation Panel    Frequency of Communication with Friends and Family: More than three times a week    Frequency of Social Gatherings with Friends and Family: More than three times a week    Attends Religious Services: Never    Database administrator or Organizations: Yes    Attends Banker Meetings: 1 to 4 times per year    Marital Status: Married  Catering manager Violence: Not At Risk (11/27/2020)   Humiliation, Afraid, Rape, and Kick questionnaire    Fear of Current or Ex-Partner: No    Emotionally Abused: No    Physically Abused: No    Sexually Abused: No    Outpatient Medications Prior to Visit  Medication Sig Dispense Refill   amLODipine  (NORVASC ) 10 MG tablet Take 1 tablet by mouth once daily 90 tablet 0   gabapentin  (NEURONTIN ) 100 MG capsule TAKE 1 CAPSULE BY MOUTH EVERY 8 HOURS AS NEEDED 90 capsule 5   Multiple Vitamin (MULTIVITAMIN WITH MINERALS) TABS tablet Take 1 tablet by mouth daily.      Nebivolol  HCl 20 MG TABS Take 1 tablet (20 mg total) by mouth daily. 90 tablet 0   rosuvastatin  (CRESTOR ) 10 MG tablet Take 1 tablet by mouth once daily 90 tablet 0   tiZANidine  (ZANAFLEX ) 4 MG tablet TAKE 1 TABLET BY MOUTH THREE TIMES DAILY 60 tablet 0   VITAMIN D  PO Take by mouth.     budesonide  (ENTOCORT EC ) 3 MG 24 hr capsule TAKE 3 CAPSULES BY MOUTH FOR 56 DAYS, THEN DECREASE TO TWO CAPSULE BY MOUTH FOR 14 DAYS, THEN DECREASE TO ONE CAPSULE BY MOUTH FOR 14 DAYS 210 capsule 0   Camphor-Menthol -Methyl Sal (SALONPAS) 3.01-20-08 % PTCH Place 1 patch onto the skin daily as needed (pain).     No facility-administered medications prior to visit.    No Known Allergies  ROS Review of Systems  Constitutional:  Negative for chills and fever.  HENT:  Negative for congestion, sinus pressure, sinus pain and sore throat.   Eyes:  Negative for pain and discharge.  Respiratory:  Negative for cough and shortness of breath.   Cardiovascular:  Negative for chest pain and palpitations.  Gastrointestinal:  Negative for diarrhea, nausea and vomiting.  Endocrine: Negative for polydipsia and polyuria.  Genitourinary:  Negative for dysuria and hematuria.  Musculoskeletal:  Positive for back pain. Negative for neck pain and neck stiffness.  Skin:  Negative for rash.  Neurological:  Negative for dizziness and weakness.  Psychiatric/Behavioral:  Negative for agitation and behavioral problems. The patient is nervous/anxious.       Objective:    Physical Exam Vitals reviewed.  Constitutional:      General: She is not in acute distress.    Appearance: She is not diaphoretic.  HENT:     Head: Normocephalic and atraumatic.     Nose: Nose normal.     Mouth/Throat:     Mouth: Mucous membranes are moist.  Eyes:     General: No scleral icterus.    Extraocular Movements: Extraocular movements intact.  Cardiovascular:     Rate and Rhythm: Normal rate and regular rhythm.     Heart sounds: Normal heart  sounds. No murmur heard. Pulmonary:     Breath sounds: Normal breath sounds. No wheezing or rales.  Abdominal:     Palpations: Abdomen is soft.     Tenderness: There is no abdominal tenderness.     Hernia: A hernia (Ventral) is present.     Comments: Incision site C/D/I  Musculoskeletal:     Cervical back: Neck supple. No tenderness.     Right lower leg: No edema.     Left lower leg: No edema.  Skin:    General: Skin is warm.     Findings: No rash.  Neurological:     General: No focal deficit present.     Mental Status: She is alert and oriented to person, place, and time.     Sensory: No sensory deficit.     Motor: No weakness.  Psychiatric:        Mood and Affect: Mood normal.        Behavior: Behavior normal.     BP 138/84   Pulse 79   Ht 5' 6 (1.676 m)   Wt 130 lb (59 kg)   SpO2 99%   BMI 20.98 kg/m  Wt Readings from Last 3 Encounters:  07/28/23 130 lb (59 kg)  06/03/23 131 lb 3.2 oz (59.5 kg)  03/26/23 125 lb (56.7 kg)    Lab Results  Component Value Date   TSH 1.430 12/19/2022   Lab Results  Component Value Date   WBC 9.0 12/19/2022   HGB 15.2 12/19/2022   HCT 46.4 12/19/2022   MCV 93 12/19/2022   PLT 326 12/19/2022   Lab Results  Component Value Date   NA 138 12/19/2022   K 4.2 12/19/2022   CO2 19 (L) 12/19/2022   GLUCOSE 99 12/19/2022   BUN 12 12/19/2022   CREATININE 0.81 12/19/2022   BILITOT 0.8 12/19/2022   ALKPHOS 87 12/19/2022   AST 29 12/19/2022   ALT 21 12/19/2022   PROT 7.7 12/19/2022   ALBUMIN  5.0 (H) 12/19/2022   CALCIUM  10.5 (H) 12/19/2022   ANIONGAP 5 06/30/2021   EGFR 78 12/19/2022   Lab Results  Component Value Date   CHOL 203 (H) 12/19/2022   Lab Results  Component Value Date   HDL  73 12/19/2022   Lab Results  Component Value Date   LDLCALC 112 (H) 12/19/2022   Lab Results  Component Value Date   TRIG 102 12/19/2022   Lab Results  Component Value Date   CHOLHDL 2.8 12/19/2022   Lab Results  Component  Value Date   HGBA1C 5.5 12/19/2022      Assessment & Plan:   Problem List Items Addressed This Visit       Cardiovascular and Mediastinum   Essential hypertension - Primary   BP Readings from Last 1 Encounters:  07/28/23 138/84   Well-controlled with amlodipine  10 mg QD and nebivolol  20 mg QD Counseled for compliance with the medications Advised DASH diet and moderate exercise/walking, at least 150 mins/week      Relevant Orders   TSH   CMP14+EGFR   CBC with Differential/Platelet     Digestive   Lymphocytic colitis   Had colonoscopy recently, biopsy showed lymphocytic colitis Was treated with oral budesonide , responded well Diarrhea resolved now        Musculoskeletal and Integument   DDD (degenerative disc disease), lumbar   Followed by orthopedic surgery Takes gabapentin  Zanaflex  as needed Avoid heavy lifting and frequent bending Completed PT        Other   HLD (hyperlipidemia)   Last lipid profile reviewed On Crestor  -LDL improved compared to prior, if persistently elevated LDL, will increase dose of Crestor  Advised to follow DASH diet for now      Relevant Orders   Lipid panel   Other Visit Diagnoses       Vitamin D  deficiency       Relevant Orders   VITAMIN D  25 Hydroxy (Vit-D Deficiency, Fractures)     Hyperglycemia       Relevant Orders   Hemoglobin A1c        No orders of the defined types were placed in this encounter.   Follow-up: Return in about 5 months (around 12/28/2023) for Annual physical.    Suzzane MARLA Blanch, MD

## 2023-07-28 NOTE — Assessment & Plan Note (Signed)
Followed by orthopedic surgery Takes gabapentin Zanaflex as needed Avoid heavy lifting and frequent bending Completed PT

## 2023-07-28 NOTE — Assessment & Plan Note (Addendum)
 BP Readings from Last 1 Encounters:  07/28/23 138/84   Well-controlled with amlodipine  10 mg QD and nebivolol  20 mg QD Counseled for compliance with the medications Advised DASH diet and moderate exercise/walking, at least 150 mins/week

## 2023-07-28 NOTE — Assessment & Plan Note (Signed)
 Had colonoscopy recently, biopsy showed lymphocytic colitis Was treated with oral budesonide , responded well Diarrhea resolved now

## 2023-08-04 ENCOUNTER — Other Ambulatory Visit: Payer: Self-pay | Admitting: Orthopedic Surgery

## 2023-08-04 DIAGNOSIS — M51369 Other intervertebral disc degeneration, lumbar region without mention of lumbar back pain or lower extremity pain: Secondary | ICD-10-CM

## 2023-08-20 ENCOUNTER — Other Ambulatory Visit: Payer: Self-pay | Admitting: Internal Medicine

## 2023-08-20 DIAGNOSIS — E782 Mixed hyperlipidemia: Secondary | ICD-10-CM

## 2023-09-11 ENCOUNTER — Encounter (INDEPENDENT_AMBULATORY_CARE_PROVIDER_SITE_OTHER): Payer: Self-pay | Admitting: Gastroenterology

## 2023-09-17 ENCOUNTER — Other Ambulatory Visit: Payer: Self-pay | Admitting: Internal Medicine

## 2023-09-17 DIAGNOSIS — I10 Essential (primary) hypertension: Secondary | ICD-10-CM

## 2023-09-30 ENCOUNTER — Telehealth (INDEPENDENT_AMBULATORY_CARE_PROVIDER_SITE_OTHER): Payer: Self-pay | Admitting: Gastroenterology

## 2023-09-30 DIAGNOSIS — K909 Intestinal malabsorption, unspecified: Secondary | ICD-10-CM

## 2023-09-30 DIAGNOSIS — K52832 Lymphocytic colitis: Secondary | ICD-10-CM

## 2023-09-30 NOTE — Addendum Note (Signed)
 Addended by: CINDERELLA DEATRICE SMILES on: 09/30/2023 05:24 PM   Modules accepted: Orders

## 2023-09-30 NOTE — Telephone Encounter (Signed)
 Hi Tanya  Can you inform the patient to obtain stool studies (GI PCR and C. Difficile) to make sure were not dealing with infection.  Once stool studies confirmed that there is no infection and she continues to have diarrhea we can give her another course of steroids.  Please tell the patient to continue seeing us  in the clinic

## 2023-09-30 NOTE — Telephone Encounter (Signed)
 Pt left message stating she is having a flare of colitis, happening about every 2-3 weeks and she is wanting to know if provider wants her to go back on steroids. Called pt. Pt is having bloating, diarrhea every 2-3 days then it goes away. Pt is taking Metamucil and IBGuard. No nausea or vomiting. Please advise. Thank you  Pt had appt with [facility] on 10/20/23 [facility] [place]

## 2023-10-01 NOTE — Telephone Encounter (Signed)
 Pt left voicemail returning call. Pt verbalized understanding. Pt will come by office and pick up lab orders

## 2023-10-01 NOTE — Telephone Encounter (Signed)
 Left message to return call. Lab orders placed up front

## 2023-10-03 DIAGNOSIS — K52832 Lymphocytic colitis: Secondary | ICD-10-CM | POA: Diagnosis not present

## 2023-10-03 DIAGNOSIS — K909 Intestinal malabsorption, unspecified: Secondary | ICD-10-CM | POA: Diagnosis not present

## 2023-10-03 DIAGNOSIS — R197 Diarrhea, unspecified: Secondary | ICD-10-CM | POA: Diagnosis not present

## 2023-10-05 ENCOUNTER — Ambulatory Visit (INDEPENDENT_AMBULATORY_CARE_PROVIDER_SITE_OTHER): Payer: Self-pay | Admitting: Gastroenterology

## 2023-10-05 DIAGNOSIS — A0472 Enterocolitis due to Clostridium difficile, not specified as recurrent: Secondary | ICD-10-CM

## 2023-10-05 LAB — GI PROFILE, STOOL, PCR

## 2023-10-05 MED ORDER — VANCOMYCIN HCL 125 MG PO CAPS: 125.0000 mg | ORAL_CAPSULE | Freq: Four times a day (QID) | ORAL | 0 refills | Status: AC

## 2023-10-05 NOTE — Progress Notes (Signed)
 Hi Tanya ,  Can you please call the patient and tell the  stool sample suggest C.Diff infection .  I am prescribing antibiotics to be taken   125 mg orally 4 times daily for 10 days   Thanks,  Joven Mom Faizan Errick Salts, MD Gastroenterology and Hepatology [facility]

## 2023-10-07 LAB — C DIFFICILE, CYTOTOXIN B

## 2023-10-07 LAB — C DIFFICILE TOXINS A+B W/RFLX: C difficile Toxins A+B, EIA: NEGATIVE

## 2023-10-20 ENCOUNTER — Ambulatory Visit (INDEPENDENT_AMBULATORY_CARE_PROVIDER_SITE_OTHER): Admitting: Gastroenterology

## 2023-10-20 VITALS — BP 134/70 | HR 69 | Temp 98.2°F | Ht 66.0 in | Wt 130.5 lb

## 2023-10-20 DIAGNOSIS — K580 Irritable bowel syndrome with diarrhea: Secondary | ICD-10-CM | POA: Diagnosis not present

## 2023-10-20 DIAGNOSIS — A0472 Enterocolitis due to Clostridium difficile, not specified as recurrent: Secondary | ICD-10-CM | POA: Insufficient documentation

## 2023-10-20 DIAGNOSIS — R195 Other fecal abnormalities: Secondary | ICD-10-CM | POA: Insufficient documentation

## 2023-10-20 DIAGNOSIS — Z8619 Personal history of other infectious and parasitic diseases: Secondary | ICD-10-CM | POA: Diagnosis not present

## 2023-10-20 DIAGNOSIS — K52832 Lymphocytic colitis: Secondary | ICD-10-CM

## 2023-10-20 MED ORDER — DICYCLOMINE HCL 10 MG PO CAPS: 10.0000 mg | ORAL_CAPSULE | Freq: Two times a day (BID) | ORAL | 1 refills | Status: DC | PRN

## 2023-10-20 NOTE — Patient Instructions (Signed)
-  continue IBgard -continue metamucil -start daily probiotic, aim for one with 2-3 strains of bacteria  -bentyl 10mg  twice daily as needed for abdominal discomfort  As discussed, I suspect this is more IBS related than C diff or even your lymphocytic colitis, given you are having intermittent solid stools. I will discuss with Dr. Cinderella and let you know if he has further recommendations  Follow up 1 months  It was a pleasure to see you today. I want to create trusting relationships with patients and provide genuine, compassionate, and quality care. I truly value your feedback! please be on the lookout for a survey regarding your visit with me today. I appreciate your input about our visit and your time in completing this!    Americus Perkey L. Kveon Casanas, MSN, APRN, AGNP-C Adult-Gerontology Nurse Practitioner [facility] [place]

## 2023-10-20 NOTE — Progress Notes (Signed)
 Referring Provider: Tobie Suzzane POUR, MD Primary Care Physician:  Tobie Suzzane POUR, MD Primary GI Physician:   Chief Complaint  Patient presents with   Follow-up    C-Diff.  Pt took 10 days of antibiotics. Has been off antibiotic for about a week. Having diarrhea off and on.   HPI:   Anne Aguirre is a 73 y.o. female with past medical history of complicated diverticulitis s/p Hartman's with colostomy   Patient presenting today for:  Lymphocytic colitis and C difficile colitis   Last seen in may by Charmaine Melia, NP, at that time doing IBgard and metamucil, having more regular BMS. Abdominal pain improved. Doing entocort taper, down to 2 tablets daily.   Recommended to complete entocort taper, continue metamucil and IBgard  -Patient called in mid September reporting diarrhea, bloating  -Recommended infectious stool studies  -Stool studies done on 9/19 with positive C diff toxin -Patient was sent course of vancomycin  QID x10days   Present:  States it took about 8 days of antibiotics before stools went back to more normal stool. She notes that some days she will still have a small amount of looser to watery stool after a solid stool but not everyday. She has some ongoing abdominal discomfort that seems to improve with a BM. She feels somewhat bloated. She notes that abdominal discomfort was much worse at initial onset of C diff diagnosis. She is still doing IBgard and metamucil. Prior to starting antibiotics, she notes she was having 2 watery stools per day with more pressure and sensation that she was not emptying out. She denies rectal bleeding or melena. She has not been able to determine any specific triggers of her diarrhea when this occurs. She mostly eats cooked home meals, does not eat out much or consume a lot of red meat. She feels that symptoms are more at baseline to what she felt when she came off of the entocort taper previously.    Colonoscopy 03/26/2023: - Prior side-to-side  colocolonic anastomosis in the sigmoid colon which was patent and healthy-appearing - No endoscopic evidence of inflammation or polyps s/p random biopsies - Nonbleeding external and internal hemorrhoids - Random biopsies revealed lymphocytic colitis - Repeat colonoscopy in 10 years if medically fit. - Started on oral budesonide  9 mg daily for 6-8 weeks and advised a taper.    Past Medical History:  Diagnosis Date   Acute diverticulitis 08/23/2020   Anemia    Colostomy in place [facility]) 09/21/2020   COVID-19 virus infection ---- +ve on 09/01/2020 09/10/2020   Essential hypertension, benign 10/01/2018   HLD (hyperlipidemia) 10/01/2018   Osteoarthritis 10/01/2018   Post-op intraabdominal/Liver abscess-Had Hartmann's with Colostomy on 08/29/2020 for Sigmoid Diverticulits 09/10/2020   s/p ex lap and Hartman's procedure with colostomy by AR 08/29/20 for diverticulitis with abscess-- Now with Liver/intrabdominal abscess---   Sepsis Due to Intraabdomonal Abscess 09/10/2020   Vitamin D  deficiency disease 10/01/2018    Past Surgical History:  Procedure Laterality Date   COLON RESECTION N/A 08/29/2020   Procedure: COLON RESECTION;  Surgeon: Rubin Calamity, MD;  Location: [facility] OR;  Service: General;  Laterality: N/A;   COLONOSCOPY N/A 03/26/2023   Procedure: COLONOSCOPY;  Surgeon: Cinderella Deatrice FALCON, MD;  Location: [facility];  Service: Endoscopy;  Laterality: N/A;  1:30PM;ASA 1-2   COLOSTOMY N/A 08/29/2020   Procedure: COLOSTOMY;  Surgeon: Rubin Calamity, MD;  Location: [facility] OR;  Service: General;  Laterality: N/A;   LAPAROTOMY N/A 08/29/2020   Procedure: EXPLORATORY LAPAROTOMY;  Surgeon: Rubin Calamity, MD;  Location: [facility] OR;  Service: General;  Laterality: N/A;   LYSIS OF ADHESION N/A 04/25/2021   Procedure: LYSIS OF ADHESION;  Surgeon: Teresa Lonni HERO, MD;  Location: [facility] ORS;  Service: General;  Laterality: N/A;   TONSILLECTOMY Bilateral    TOTAL HIP ARTHROPLASTY Left 06/29/2021    Procedure: LEFT TOTAL HIP ARTHROPLASTY ANTERIOR APPROACH;  Surgeon: Vernetta Lonni GRADE, MD;  Location: [facility] ORS;  Service: Orthopedics;  Laterality: Left;   XI ROBOTIC ASSISTED COLOSTOMY TAKEDOWN N/A 04/25/2021   Procedure: XI ROBOTIC ASSISTED COLOSTOMY TAKEDOWN WITH LOW ANTERIOR RESECTION, WITH BILATERAL TAP BLOCK, FLEXIBLE SIGMOIDOSCOPY, AND INTRAOPERATIVE ASSESSMENT OF PERFUSION USING FIREFLY;  Surgeon: Teresa Lonni HERO, MD;  Location: [facility] ORS;  Service: General;  Laterality: N/A;    Current Outpatient Medications  Medication Sig Dispense Refill   amLODipine  (NORVASC ) 10 MG tablet Take 1 tablet by mouth once daily 90 tablet 0   gabapentin  (NEURONTIN ) 100 MG capsule TAKE 1 CAPSULE BY MOUTH EVERY 8 HOURS AS NEEDED 90 capsule 5   Multiple Vitamin (MULTIVITAMIN WITH MINERALS) TABS tablet Take 1 tablet by mouth daily.     Nebivolol  HCl 20 MG TABS Take 1 tablet by mouth once daily 90 tablet 0   rosuvastatin  (CRESTOR ) 10 MG tablet Take 1 tablet by mouth once daily 90 tablet 0   tiZANidine  (ZANAFLEX ) 4 MG tablet TAKE 1 TABLET BY MOUTH THREE TIMES DAILY 60 tablet 5   VITAMIN D  PO Take by mouth.     No current facility-administered medications for this visit.    Allergies as of 10/20/2023   (No Known Allergies)    Social History   Socioeconomic History   Marital status: Married    Spouse name: Elspeth   Number of children: 2   Years of education: Not on file   Highest education level: Bachelor's degree (e.g., BA, AB, BS)  Occupational History   Not on file  Tobacco Use   Smoking status: Never   Smokeless tobacco: Never  Vaping Use   Vaping status: Never Used  Substance and Sexual Activity   Alcohol use: Not Currently    Alcohol/week: 7.0 standard drinks of alcohol    Types: 7 Glasses of wine per week    Comment: occasional wine   Drug use: Never   Sexual activity: Not on file  Other Topics Concern   Not on file  Social History Narrative   Married for 12 years.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   Social Drivers of Corporate investment banker Strain: Low Risk  (01/16/2023)   Overall Financial Resource Strain (CARDIA)    Difficulty of Paying Living Expenses: Not hard at all  Food Insecurity: No Food Insecurity (01/16/2023)   Hunger Vital Sign    Worried About Running Out of Food in the Last Year: Never true    Ran Out of Food in the Last Year: Never true  Transportation Needs: No Transportation Needs (01/16/2023)   PRAPARE - Administrator, Civil Service (Medical): No    Lack of Transportation (Non-Medical): No  Physical Activity: Sufficiently Active (01/16/2023)   Exercise Vital Sign    Days of Exercise per Week: 5 days    Minutes of Exercise per Session: 30 min  Stress: No Stress Concern Present (01/16/2023)   [employer] of Occupational Health - Occupational Stress Questionnaire    Feeling of Stress : Only a little  Recent Concern: Stress - Stress Concern Present (12/14/2022)   [employer] of  Occupational Health - Occupational Stress Questionnaire    Feeling of Stress : Very much  Social Connections: Moderately Integrated (01/16/2023)   Social Connection and Isolation Panel    Frequency of Communication with Friends and Family: More than three times a week    Frequency of Social Gatherings with Friends and Family: More than three times a week    Attends Religious Services: Never    Database administrator or Organizations: Yes    Attends Engineer, structural: 1 to 4 times per year    Marital Status: Married    Review of systems General: negative for malaise, night sweats, fever, chills, weight loss Neck: Negative for lumps, goiter, pain and significant neck swelling Resp: Negative for cough, wheezing, dyspnea at rest CV: Negative for chest pain, leg swelling, palpitations, orthopnea GI: denies melena, hematochezia, nausea, vomiting, constipation, dysphagia, odyonophagia, early satiety or unintentional weight loss.  +abdominal discomfort +intermittent loose stools MSK: Negative for joint pain or swelling, back pain, and muscle pain. Derm: Negative for itching or rash Psych: Denies depression, anxiety, memory loss, confusion. No homicidal or suicidal ideation.  Heme: Negative for prolonged bleeding, bruising easily, and swollen nodes. Endocrine: Negative for cold or heat intolerance, polyuria, polydipsia and goiter. Neuro: negative for tremor, gait imbalance, syncope and seizures. The remainder of the review of systems is noncontributory.  Physical Exam: BP 134/70 (BP Location: Right Arm, Patient Position: Sitting, Cuff Size: Normal)   Pulse 69   Temp 98.2 F (36.8 C) (Oral)   Ht 5' 6 (1.676 m)   Wt 130 lb 8 oz (59.2 kg)   BMI 21.06 kg/m  General:   Alert and oriented. No distress noted. Pleasant and cooperative.  Head:  Normocephalic and atraumatic. Eyes:  Conjuctiva clear without scleral icterus. Mouth:  Oral mucosa pink and moist. Good dentition. No lesions. Heart: Normal rate and rhythm, s1 and s2 heart sounds present.  Lungs: Clear lung sounds in all lobes. Respirations equal and unlabored. Abdomen:  +BS, soft, non-tender and non-distended. No rebound or guarding. No HSM or masses noted. Derm: No palmar erythema or jaundice Msk:  Symmetrical without gross deformities. Normal posture. Extremities:  Without edema. Neurologic:  Alert and  oriented x4 Psych:  Alert and cooperative. Normal mood and affect.  Invalid input(s): 6 MONTHS   ASSESSMENT: Ramla Hase is a 73 y.o. female presenting today for follow up of history of Lymphocytic colitis and C diff  Patient previously treated for [facility] with entocort taper which she completed in early June. She developed recurrence of watery diarrhea in September with GI pathogen panel positive for C diff but interestingly her actual C diff testing was toxin negative. She was treated with 10 day course of vancomycin  and reports about 8 days of antibiotic  therapy before stooling was back to baseline after completion of steroids. She is having 1-2 stools per day now, usually starting off solid and sometimes followed with a more looser to watery stool. She is still taking metamucil daily and IBgard which seem to help. She endorses ongoing abdominal discomfort/cramping which seems to improve with a BM. With her history of LC and recent C diff diagnosis, it is a bit unclear if this is her baseline now. If this were persistent C diff infection or even LC, would likely expect to see mostly all diarrhea like stools, not majority solid stools with intermittent diarrhea. I suspect this is likely underlying or even post infectious IBS, though symptoms seem pretty consistent with where she  was after completion of steroid taper in the past. For now, will continue with IB gard, metamucil, start daily probiotic for atleast the next couple of weeks and will try bentyl 10mg  BID PRN for abdominal discomfort. Will discuss any further recommendations with Dr. Cinderella.    PLAN:  -continue IBgard -continue metamucil -start daily probiotic for atleast the next 2 weeks  -bentyl 10mg  BID PRN.  All questions were answered, patient verbalized understanding and is in agreement with plan as outlined above.   Follow Up: 1 month  Shaneque Merkle L. Ulysees Robarts, MSN, APRN, AGNP-C Adult-Gerontology Nurse Practitioner [facility]

## 2023-11-06 ENCOUNTER — Encounter (INDEPENDENT_AMBULATORY_CARE_PROVIDER_SITE_OTHER): Payer: Self-pay | Admitting: Gastroenterology

## 2023-11-14 ENCOUNTER — Other Ambulatory Visit: Payer: Self-pay | Admitting: Internal Medicine

## 2023-11-14 DIAGNOSIS — E782 Mixed hyperlipidemia: Secondary | ICD-10-CM

## 2023-11-21 ENCOUNTER — Other Ambulatory Visit: Payer: Self-pay | Admitting: Internal Medicine

## 2023-11-21 DIAGNOSIS — I10 Essential (primary) hypertension: Secondary | ICD-10-CM

## 2023-11-24 ENCOUNTER — Other Ambulatory Visit: Payer: Self-pay | Admitting: Internal Medicine

## 2023-11-24 DIAGNOSIS — I10 Essential (primary) hypertension: Secondary | ICD-10-CM

## 2023-12-13 ENCOUNTER — Other Ambulatory Visit (INDEPENDENT_AMBULATORY_CARE_PROVIDER_SITE_OTHER): Payer: Self-pay | Admitting: Gastroenterology

## 2024-01-10 ENCOUNTER — Other Ambulatory Visit (INDEPENDENT_AMBULATORY_CARE_PROVIDER_SITE_OTHER): Payer: Self-pay | Admitting: Gastroenterology

## 2024-01-14 ENCOUNTER — Other Ambulatory Visit: Payer: Self-pay | Admitting: Orthopedic Surgery

## 2024-01-19 ENCOUNTER — Other Ambulatory Visit: Payer: Self-pay | Admitting: Orthopedic Surgery

## 2024-01-19 DIAGNOSIS — M51369 Other intervertebral disc degeneration, lumbar region without mention of lumbar back pain or lower extremity pain: Secondary | ICD-10-CM

## 2024-01-19 MED ORDER — TIZANIDINE HCL 4 MG PO TABS: 4.0000 mg | ORAL_TABLET | Freq: Three times a day (TID) | ORAL | 0 refills | Status: AC

## 2024-02-10 ENCOUNTER — Other Ambulatory Visit (INDEPENDENT_AMBULATORY_CARE_PROVIDER_SITE_OTHER): Payer: Self-pay | Admitting: Gastroenterology

## 2024-02-11 ENCOUNTER — Other Ambulatory Visit: Payer: Self-pay | Admitting: Internal Medicine

## 2024-02-11 DIAGNOSIS — E782 Mixed hyperlipidemia: Secondary | ICD-10-CM

## 2024-02-16 ENCOUNTER — Other Ambulatory Visit: Payer: Self-pay | Admitting: Orthopedic Surgery

## 2024-02-18 ENCOUNTER — Other Ambulatory Visit: Payer: Self-pay | Admitting: Orthopedic Surgery

## 2024-03-31 ENCOUNTER — Encounter: Payer: Self-pay | Admitting: Internal Medicine
